# Patient Record
Sex: Male | Born: 1945 | Race: White | Hispanic: No | Marital: Married | Smoking: Former smoker
Health system: Southern US, Community
[De-identification: ages and names within clinical notes are randomized; demographics above are authoritative.]

## PROBLEM LIST (undated history)

## (undated) DIAGNOSIS — I480 Paroxysmal atrial fibrillation: Secondary | ICD-10-CM

## (undated) DIAGNOSIS — R7303 Prediabetes: Secondary | ICD-10-CM

## (undated) DIAGNOSIS — K759 Inflammatory liver disease, unspecified: Secondary | ICD-10-CM

## (undated) DIAGNOSIS — I10 Essential (primary) hypertension: Secondary | ICD-10-CM

## (undated) DIAGNOSIS — Z923 Personal history of irradiation: Secondary | ICD-10-CM

## (undated) DIAGNOSIS — N200 Calculus of kidney: Secondary | ICD-10-CM

## (undated) DIAGNOSIS — E119 Type 2 diabetes mellitus without complications: Secondary | ICD-10-CM

## (undated) DIAGNOSIS — Z87442 Personal history of urinary calculi: Secondary | ICD-10-CM

## (undated) DIAGNOSIS — M1990 Unspecified osteoarthritis, unspecified site: Secondary | ICD-10-CM

## (undated) DIAGNOSIS — C801 Malignant (primary) neoplasm, unspecified: Secondary | ICD-10-CM

## (undated) DIAGNOSIS — E7800 Pure hypercholesterolemia, unspecified: Secondary | ICD-10-CM

## (undated) DIAGNOSIS — Z9289 Personal history of other medical treatment: Secondary | ICD-10-CM

## (undated) DIAGNOSIS — M21371 Foot drop, right foot: Secondary | ICD-10-CM

## (undated) DIAGNOSIS — M21372 Foot drop, left foot: Secondary | ICD-10-CM

## (undated) DIAGNOSIS — G709 Myoneural disorder, unspecified: Secondary | ICD-10-CM

## (undated) HISTORY — DX: Calculus of kidney: N20.0

## (undated) HISTORY — PX: FOOT SURGERY: SHX648

## (undated) HISTORY — DX: Essential (primary) hypertension: I10

## (undated) HISTORY — PX: BACK SURGERY: SHX140

## (undated) HISTORY — DX: Personal history of irradiation: Z92.3

## (undated) HISTORY — DX: Pure hypercholesterolemia, unspecified: E78.00

## (undated) HISTORY — DX: Inflammatory liver disease, unspecified: K75.9

---

## 2000-11-15 ENCOUNTER — Encounter: Payer: Self-pay | Admitting: Family Medicine

## 2000-11-15 ENCOUNTER — Ambulatory Visit (HOSPITAL_COMMUNITY): Admission: RE | Admit: 2000-11-15 | Discharge: 2000-11-15 | Payer: Self-pay | Admitting: Family Medicine

## 2000-11-26 ENCOUNTER — Encounter: Payer: Self-pay | Admitting: Family Medicine

## 2000-11-26 ENCOUNTER — Ambulatory Visit (HOSPITAL_COMMUNITY): Admission: RE | Admit: 2000-11-26 | Discharge: 2000-11-26 | Payer: Self-pay | Admitting: Family Medicine

## 2002-05-26 ENCOUNTER — Ambulatory Visit (HOSPITAL_COMMUNITY): Admission: RE | Admit: 2002-05-26 | Discharge: 2002-05-26 | Payer: Self-pay | Admitting: Family Medicine

## 2002-05-26 ENCOUNTER — Encounter: Payer: Self-pay | Admitting: Family Medicine

## 2005-06-25 ENCOUNTER — Ambulatory Visit (HOSPITAL_COMMUNITY): Admission: RE | Admit: 2005-06-25 | Discharge: 2005-06-25 | Payer: Self-pay | Admitting: Family Medicine

## 2005-12-19 ENCOUNTER — Ambulatory Visit (HOSPITAL_COMMUNITY): Admission: RE | Admit: 2005-12-19 | Discharge: 2005-12-19 | Payer: Self-pay | Admitting: Otolaryngology

## 2008-01-13 ENCOUNTER — Emergency Department (HOSPITAL_COMMUNITY): Admission: EM | Admit: 2008-01-13 | Discharge: 2008-01-13 | Payer: Self-pay | Admitting: Emergency Medicine

## 2008-01-20 ENCOUNTER — Ambulatory Visit (HOSPITAL_COMMUNITY): Admission: RE | Admit: 2008-01-20 | Discharge: 2008-01-20 | Payer: Self-pay | Admitting: Urology

## 2008-09-04 ENCOUNTER — Emergency Department (HOSPITAL_COMMUNITY): Admission: EM | Admit: 2008-09-04 | Discharge: 2008-09-04 | Payer: Self-pay | Admitting: Emergency Medicine

## 2008-09-08 ENCOUNTER — Ambulatory Visit (HOSPITAL_COMMUNITY): Admission: RE | Admit: 2008-09-08 | Discharge: 2008-09-08 | Payer: Self-pay | Admitting: General Surgery

## 2010-02-19 ENCOUNTER — Encounter: Payer: Self-pay | Admitting: Orthopedic Surgery

## 2010-05-06 LAB — CBC
Hemoglobin: 14.4 g/dL (ref 13.0–17.0)
MCHC: 34.4 g/dL (ref 30.0–36.0)
Platelets: 216 10*3/uL (ref 150–400)
RDW: 12.8 % (ref 11.5–15.5)

## 2010-05-06 LAB — BASIC METABOLIC PANEL
BUN: 20 mg/dL (ref 6–23)
Creatinine, Ser: 0.96 mg/dL (ref 0.4–1.5)
GFR calc non Af Amer: >60 mL/min (ref >60)
Glucose, Bld: 140 mg/dL — ABNORMAL HIGH (ref 70–99)
Potassium: 3.9 mEq/L (ref 3.5–5.1)

## 2010-05-06 LAB — URINALYSIS, ROUTINE W REFLEX MICROSCOPIC
Bilirubin Urine: NEGATIVE
Nitrite: NEGATIVE
Specific Gravity, Urine: 1.015 (ref 1.005–1.030)
Urobilinogen, UA: 0.2 mg/dL (ref 0.0–1.0)

## 2010-05-06 LAB — DIFFERENTIAL
Basophils Absolute: 0 10*3/uL (ref 0.0–0.1)
Basophils Relative: 0 % (ref 0–1)
Eosinophils Relative: 2 % (ref 0–5)
Monocytes Absolute: 0.6 10*3/uL (ref 0.1–1.0)
Neutro Abs: 10 10*3/uL — ABNORMAL HIGH (ref 1.7–7.7)

## 2010-06-13 NOTE — H&P (Signed)
NAME:  James Dyer, TATSCH NO.:  [id number]   MEDICAL RECORD NO.:  [id number]          PATIENT TYPE:  [facility]   LOCATION:  DAY                           FACILITY:  [facility]   PHYSICIAN:  Dalia Heading, M.D.  DATE OF BIRTH:  13-Nov-1945   DATE OF ADMISSION:  DATE OF DISCHARGE:  LH                              HISTORY & PHYSICAL   CHIEF COMPLAINT:  Need for screening colonoscopy.   HISTORY OF PRESENT ILLNESS:  The patient is a 65 year old white male who  was referred for endoscopic evaluation.  He needs colonoscopy for  screening purposes.  No abdominal pain, weight loss, nausea, vomiting,  diarrhea, constipation, melena, hematochezia have been noted.  He has  never had a colonoscopy.  There is no family history of colon carcinoma.   PAST MEDICAL HISTORY:  Hypertension.   PAST SURGICAL HISTORY:  Unremarkable.   CURRENT MEDICATIONS:  Enalapril.   ALLERGIES:  No known drug allergies.   REVIEW OF SYSTEMS:  Noncontributory.   PHYSICAL EXAMINATION:  GENERAL:  The patient is a well-developed, well-  nourished white male in no acute distress.  LUNGS:  Clear to auscultation with equal breath sounds bilaterally.  HEART:  Regular rate and rhythm without S3, S4, or murmurs.  ABDOMEN:  Soft, nontender, nondistended.  No hepatosplenomegaly or  masses noted.  RECTAL:  Examination was deferred to the procedure.   IMPRESSION:  Need for screening colonoscopy.   PLAN:  The patient is scheduled for a colonoscopy on September 08, 2008.  The risks and benefits of the procedure including bleeding and  perforation were fully explained to the patient, gave informed consent.      Dalia Heading, M.D.  Electronically Signed     MAJ/MEDQ  D:  08/31/2008  T:  09/01/2008  Job:  [id number]   cc:   Corrie Mckusick, M.D.  Fax: [phone]   Short-Stay at [facility]

## 2010-06-18 ENCOUNTER — Inpatient Hospital Stay (INDEPENDENT_AMBULATORY_CARE_PROVIDER_SITE_OTHER)
Admission: RE | Admit: 2010-06-18 | Discharge: 2010-06-18 | Disposition: A | Discharge status: Home / Self Care | Payer: 59 | Source: Ambulatory Visit | Attending: Emergency Medicine | Admitting: Emergency Medicine

## 2010-06-18 DIAGNOSIS — J019 Acute sinusitis, unspecified: Secondary | ICD-10-CM

## 2010-06-18 DIAGNOSIS — J069 Acute upper respiratory infection, unspecified: Secondary | ICD-10-CM

## 2010-06-18 DIAGNOSIS — I10 Essential (primary) hypertension: Secondary | ICD-10-CM

## 2010-12-18 ENCOUNTER — Encounter: Payer: Self-pay | Admitting: *Deleted

## 2010-12-18 DIAGNOSIS — C61 Malignant neoplasm of prostate: Secondary | ICD-10-CM | POA: Insufficient documentation

## 2010-12-19 ENCOUNTER — Ambulatory Visit
Admission: RE | Admit: 2010-12-19 | Discharge: 2010-12-19 | Disposition: A | Discharge status: Home / Self Care | Payer: 59 | Source: Ambulatory Visit | Attending: Radiation Oncology | Admitting: Radiation Oncology

## 2010-12-19 ENCOUNTER — Encounter: Payer: Self-pay | Admitting: Radiation Oncology

## 2010-12-19 VITALS — BP 147/81 | HR 91 | Temp 97.9°F | Resp 18 | Ht 70.0 in | Wt 239.2 lb

## 2010-12-19 DIAGNOSIS — E7800 Pure hypercholesterolemia, unspecified: Secondary | ICD-10-CM | POA: Insufficient documentation

## 2010-12-19 DIAGNOSIS — C61 Malignant neoplasm of prostate: Secondary | ICD-10-CM | POA: Insufficient documentation

## 2010-12-19 DIAGNOSIS — Z87891 Personal history of nicotine dependence: Secondary | ICD-10-CM | POA: Insufficient documentation

## 2010-12-19 DIAGNOSIS — I10 Essential (primary) hypertension: Secondary | ICD-10-CM | POA: Insufficient documentation

## 2010-12-19 NOTE — Progress Notes (Signed)
Please see the Nurse Progress Note in the MD Initial Consult Encounter for this patient. 

## 2010-12-19 NOTE — Progress Notes (Signed)
CC:   James Dyer, M.D. James Dyer, M.D.  REFERRING PHYSICIAN:  Bertram Millard. Dyer, M.D.  DIAGNOSIS:  Stage T1c adenocarcinoma of the prostate.  PREVIOUS RADIATION THERAPY:  None.  PAST MEDICAL HISTORY:  History of hepatitis, question B, hypercholesterolemia, hypertension, and nephrolithiasis.  Also, status post foot surgery.  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  MEDICATIONS:  Vitamins, Vasotec, fish oil, and vitamin D.  ALLERGIES:  No known drug allergies.  FAMILY ETIOLOGY/SOCIAL:  Married, 21 year old son.  Remote cigarette use, 5 pack-year history.  FAMILY HISTORY:  His mother is 70, alive and well.  His father died from what sounds like stomach cancer at 52.  REVIEW OF SYSTEMS:  Is obtained and is listed in the HPI.  HISTORY OF PRESENT ILLNESS:  James Dyer is a pleasant 65 year old male who is seen today through the courtesy of Dr. Willow Ora for discussion of possible radiation therapy options in the management of his stage T1c adenocarcinoma of the prostate.  He was noted to have a PSA of 2.96 in August of 2011, rising to 3.7 by August of 2012.  He was seen by Dr. Retta Diones and underwent ultrasound-guided biopsies on 11/23/2010.  He was found have adenocarcinoma with a Gleason score 7 (3 + 4) involving 30% of 1 core from the right lateral apex.  Remaining biopsies were benign.  His prostate gland volume was 38 cc.  He is doing well from a GU and GI standpoint.  His IPSS score is 7.  No GI difficulties.  He is potent.  PHYSICAL EXAMINATION:  General: Alert and oriented 65 year old white male appearing his stated age.  Vital signs:  Blood pressure 147/81, pulse 91, respiratory rate 20, and temperature 97.9.  Head and neck examination:  Grossly unremarkable.  Nodes: Without palpable cervical or supraclavicular lymphadenopathy.  Chest: Lungs clear.  Back: Without spinal or CVA tenderness.  Heart: Regular rate and  rhythm.  Abdomen: Soft without masses or organomegaly.  Genitalia: Unremarkable to inspection.  Rectal: Prostate gland is normal in size and is without focal induration or nodularity.  Extremities: He has lower extremity foot/ankle braces.  There is no peripheral edema.  LABORATORY DATA:  PSA 3.7 from 09/02/2010.  IMPRESSION:  Stage T1c, intermediate risk adenocarcinoma of the prostate.  I explained to James Dyer and his wife that his prognosis is related to his stage, PSA level, and Gleason score.  His stage and PSA level are favorable while his Gleason score of 7 is of intermediate variability.  Management options include surgery versus observation versus radiation therapy.  Radiation therapy options include seed implantation alone versus external beam/IMRT.  We discussed the potential acute and late toxicities of each radiation therapy option. He also understands that surgery is certainly an excellent option for him.  A family friend, apparently had some difficulty with surgery along with urinary incontinence, and he wants to consider his radiation therapy options more carefully.  He was given literature for review.  He can call me if he wants to proceed with either radiation therapy option.  He would be an excellent candidate for seed implantation. One hour was spent face to face with the patient and his wife; more than 50% of the time counseling the patient.    ______________________________ Maryln Gottron, M.D. RJM/MEDQ  D:  12/19/2010  T:  12/19/2010  Job:  [id number]

## 2010-12-19 NOTE — Progress Notes (Signed)
Encounter addended by: Maryln Gottron, MD on: 12/19/2010  7:04 PM<BR>     Documentation filed: Visit Diagnoses

## 2010-12-19 NOTE — Progress Notes (Signed)
Encounter addended by: Maryln Gottron, MD on: 12/19/2010  6:26 PM<BR>     Documentation filed: Visit Diagnoses

## 2010-12-19 NOTE — Progress Notes (Signed)
Aug. 2012 PSA = 3.7 11/23/10 BIOPSY.....PROSTATE SIZE 38CC,       1/12 BIOPSIES REVEALED ADENOCARCINOMA.  THIS WAS FROM THE RIGHT LATERAL APEX, WITH 30% OF CORE BEING POSITIVE FOR CANCER.  HE HAS MINIMAL URINARY SYMPTOMATOLOGY, AUA BPH SYMPTOM SCORE IS 7.  HE HAS MILD FREQUENCY. NO ED

## 2010-12-19 NOTE — Progress Notes (Signed)
Encounter addended by: Maryln Gottron, MD on: 12/19/2010  6:25 PM<BR>     Documentation filed: Visit Diagnoses

## 2010-12-20 ENCOUNTER — Telehealth: Payer: Self-pay | Admitting: Radiation Oncology

## 2010-12-20 ENCOUNTER — Other Ambulatory Visit: Payer: Self-pay | Admitting: Radiation Oncology

## 2010-12-20 NOTE — Telephone Encounter (Signed)
Pt's wife Dois Davenport) called to request that Dr. Dayton Scrape fax a letter to his employer [employer])  Prescription Note faxed to: [phone]

## 2010-12-29 ENCOUNTER — Encounter: Payer: Self-pay | Admitting: *Deleted

## 2010-12-29 NOTE — Progress Notes (Signed)
[name] was referred by distress screening protocol. Pt scored a 8 on the Distress thermometer indicating severe distress. [name] attempted to contact pt, however, pt works during the day. [name] spoke with pt's wife who states pt retiring soon, but felt majority of stress at this time was due to insurance coverage. [name] requested pt contact to further assess for distress and provide support. Kathrin Penner, LCSWA

## 2011-01-18 ENCOUNTER — Encounter: Payer: Self-pay | Admitting: Radiation Oncology

## 2011-01-19 NOTE — Progress Notes (Signed)
CC:   Bertram Millard. Dahlstedt, M.D. Corrie Mckusick, M.D.  [name] called me today to tell me that he wants to be treated with external beam/IMRT rather than seed implantation.  I will kindly ask Dr. Retta Diones to place 3 gold seed markers for prostate localization/image guidance.  He plans on being out of town for up to a week in early February.  He does not want to begin his radiation therapy until after February 18.  I will get him set up for his treatment planning after February 18.  Consent will be signed when he returns for simulation/treatment planning.    ______________________________ Maryln Gottron, M.D. RJM/MEDQ  D:  01/18/2011  T:  01/19/2011  Job:  [id number]

## 2011-01-22 ENCOUNTER — Telehealth: Payer: Self-pay | Admitting: *Deleted

## 2011-01-30 DIAGNOSIS — C801 Malignant (primary) neoplasm, unspecified: Secondary | ICD-10-CM

## 2011-01-30 HISTORY — PX: OTHER SURGICAL HISTORY: SHX169

## 2011-01-30 HISTORY — DX: Malignant (primary) neoplasm, unspecified: C80.1

## 2011-02-01 ENCOUNTER — Telehealth: Payer: Self-pay | Admitting: *Deleted

## 2011-02-03 DIAGNOSIS — J069 Acute upper respiratory infection, unspecified: Secondary | ICD-10-CM | POA: Diagnosis not present

## 2011-02-03 DIAGNOSIS — H66009 Acute suppurative otitis media without spontaneous rupture of ear drum, unspecified ear: Secondary | ICD-10-CM | POA: Diagnosis not present

## 2011-02-03 DIAGNOSIS — J019 Acute sinusitis, unspecified: Secondary | ICD-10-CM | POA: Diagnosis not present

## 2011-02-03 DIAGNOSIS — Z6834 Body mass index (BMI) 34.0-34.9, adult: Secondary | ICD-10-CM | POA: Diagnosis not present

## 2011-03-19 DIAGNOSIS — C61 Malignant neoplasm of prostate: Secondary | ICD-10-CM | POA: Diagnosis not present

## 2011-03-21 ENCOUNTER — Ambulatory Visit: Payer: Medicare Other | Admitting: Radiation Oncology

## 2011-03-22 ENCOUNTER — Ambulatory Visit: Payer: Medicare Other

## 2011-03-22 ENCOUNTER — Ambulatory Visit: Payer: Medicare Other | Admitting: Radiation Oncology

## 2011-03-23 NOTE — Telephone Encounter (Signed)
xxx

## 2011-03-23 NOTE — Telephone Encounter (Signed)
XXXX 

## 2011-03-27 ENCOUNTER — Ambulatory Visit: Payer: Medicare Other | Admitting: Radiation Oncology

## 2011-03-27 ENCOUNTER — Ambulatory Visit: Payer: Medicare Other

## 2011-04-03 ENCOUNTER — Ambulatory Visit
Admission: RE | Admit: 2011-04-03 | Discharge: 2011-04-03 | Disposition: A | Discharge status: Home / Self Care | Payer: Medicare Other | Source: Ambulatory Visit | Attending: Radiation Oncology | Admitting: Radiation Oncology

## 2011-04-03 DIAGNOSIS — C61 Malignant neoplasm of prostate: Secondary | ICD-10-CM | POA: Diagnosis not present

## 2011-04-03 DIAGNOSIS — R350 Frequency of micturition: Secondary | ICD-10-CM | POA: Insufficient documentation

## 2011-04-03 NOTE — Progress Notes (Signed)
Simulation/treatment planning note:  The patient was taken to the CT simulator. He was placed supine. An alpha cradle was constructed for immobilization. A red rubber catheter was placed within the rectal vault. He was then catheterized and contrast instilled into the bladder/urethra. He was then scanned. I chose and isocenter along the mid prostate. On the CT data set I contoured his prostate and seminal vesicles. We also contoured his bladder, rectum, bowel and femoral heads. I prescribing 7800 cGy in 40 sessions to his prostate PTV which represents a prostate posterior 0.8 cm except for 0.5 cm along the rectum. I'm prescribing 5600 cGy in 40 sessions to his seminal vesical PTV which represents a seminal vesicles posterior 0.5 cm. He is now ready for IM RT simulation/treatment planning.

## 2011-04-03 NOTE — Progress Notes (Signed)
Met with patient to discuss RO billing.  Patient had no concerns today. 

## 2011-04-04 ENCOUNTER — Encounter: Payer: Self-pay | Admitting: *Deleted

## 2011-04-04 NOTE — Progress Notes (Signed)
12/19/2010 I-PSS= [id number] score=7

## 2011-04-05 ENCOUNTER — Encounter: Payer: Self-pay | Admitting: Radiation Oncology

## 2011-04-05 DIAGNOSIS — C61 Malignant neoplasm of prostate: Secondary | ICD-10-CM | POA: Diagnosis not present

## 2011-04-05 DIAGNOSIS — R350 Frequency of micturition: Secondary | ICD-10-CM | POA: Diagnosis not present

## 2011-04-05 NOTE — Progress Notes (Signed)
IMRT simulation/treatment planning note  The patient underwent IMRT simulation/treatment planning in the management of his carcinoma the prostate. IMRT was chosen to decrease the risk for both acute and late rectal and bladder toxicity compared to conventional or 3-D conformal radiation therapy. I'm prescribing 7800 cGy in 40 sessions to his prostate PTV and 5600 cGy in 40 sessions to his seminal vesicles. We used 10 MV photons for his IMRT to limit the dose extension along his anterior target when compared to 6 MV photons. Dose volume histograms were obtained for the target structures including the prostate and seminal vesicles. Dose volume histograms were also obtained for the avoidance structures including the rectum, bladder, and femoral heads. We met our department  target goals and avoidance goals. He is to be treated with a comfortably full bladder and undergo daily KV imaging, setting up to his 3 gold seeds within the prostate. I'm also requesting a weekly cone beam CT scan for assessment of his bladder filling. Please see his electronic medical record for a specific dose volume histograms.

## 2011-04-12 ENCOUNTER — Ambulatory Visit
Admission: RE | Admit: 2011-04-12 | Payer: Medicare Other | Source: Ambulatory Visit | Attending: Radiation Oncology | Admitting: Radiation Oncology

## 2011-04-12 ENCOUNTER — Ambulatory Visit
Admission: RE | Admit: 2011-04-12 | Discharge: 2011-04-12 | Disposition: A | Discharge status: Home / Self Care | Payer: Medicare Other | Source: Ambulatory Visit | Attending: Radiation Oncology | Admitting: Radiation Oncology

## 2011-04-12 DIAGNOSIS — C61 Malignant neoplasm of prostate: Secondary | ICD-10-CM | POA: Diagnosis not present

## 2011-04-12 DIAGNOSIS — R350 Frequency of micturition: Secondary | ICD-10-CM | POA: Diagnosis not present

## 2011-04-13 ENCOUNTER — Ambulatory Visit
Admission: RE | Admit: 2011-04-13 | Discharge: 2011-04-13 | Disposition: A | Discharge status: Home / Self Care | Payer: Medicare Other | Source: Ambulatory Visit | Admitting: Radiation Oncology

## 2011-04-13 ENCOUNTER — Ambulatory Visit
Admission: RE | Admit: 2011-04-13 | Discharge: 2011-04-13 | Disposition: A | Discharge status: Home / Self Care | Payer: Medicare Other | Source: Ambulatory Visit | Attending: Radiation Oncology | Admitting: Radiation Oncology

## 2011-04-13 DIAGNOSIS — C61 Malignant neoplasm of prostate: Secondary | ICD-10-CM | POA: Diagnosis not present

## 2011-04-13 DIAGNOSIS — R350 Frequency of micturition: Secondary | ICD-10-CM | POA: Diagnosis not present

## 2011-04-16 ENCOUNTER — Ambulatory Visit
Admission: RE | Admit: 2011-04-16 | Discharge: 2011-04-16 | Disposition: A | Discharge status: Home / Self Care | Payer: Medicare Other | Source: Ambulatory Visit | Attending: Radiation Oncology | Admitting: Radiation Oncology

## 2011-04-16 ENCOUNTER — Encounter: Payer: Self-pay | Admitting: Radiation Oncology

## 2011-04-16 VITALS — BP 136/88 | HR 79 | Resp 18 | Wt 237.4 lb

## 2011-04-16 DIAGNOSIS — R350 Frequency of micturition: Secondary | ICD-10-CM | POA: Diagnosis not present

## 2011-04-16 DIAGNOSIS — C61 Malignant neoplasm of prostate: Secondary | ICD-10-CM | POA: Diagnosis not present

## 2011-04-16 NOTE — Progress Notes (Signed)
Weekly Management Note:  Site:Prostate Current Dose:  585  cGy Projected Dose: 7800  cGy  Narrative: The patient is seen today for routine under treatment assessment. CBCT/MVCT images/port films were reviewed. The chart was reviewed.   Satisfactory bladder filling although he did not have his water bottle on his way to treatment today. He knows he can have better bladder filling. No GU or GI difficulties.  Physical Examination:  Filed Vitals:   04/16/11 1423  BP: 136/88  Pulse: 79  Resp:   .  Weight: 237 lb 6.4 oz (107.684 kg). No change.  Impression: Tolerating radiation therapy well.  Plan: Continue radiation therapy as planned.

## 2011-04-16 NOTE — Progress Notes (Signed)
Patient presents to the clinic today unaccompanied for under treat visit with Dr. Dayton Scrape and post sim education with Sam, RN. Patient is alert and oriented to person, place, and time. No distress noted. Steady gait noted. Pleasant affect noted. Patient denies pain at this time. Blood pressure elevated despite patient taking bp med this morning. Patient reports voiding once per night. Patient reports discomfort while urination but denies burning. Patient reports that over the weekend he noted that he did not get the urge to void as he has in the past but when he would force himself to void he could pass a considerable amount of urine. Patient denies hematuria. Patient denies nausea, vomiting, headache, dizziness or diarrhea. Reported all findings to Dr. Dayton Scrape.

## 2011-04-16 NOTE — Progress Notes (Signed)
Received patient in the clinic today for PUT with Dr. Dayton Scrape and post sim education with Sam, RN. Patient is alert and oriented to person, place, and time. No distress noted. Steady gait noted. Pleasant affect noted. Patient denies pain at this time. Oriented patient to staff and routine of the clinic. Provided patient with RADIATION THERAPY AND YOU handbook then, reviewed pertinent information. Educated patient on potential side effects and management. Provided patient with this writers business card and encouraged to call with needs. Patient verbalized understanding of all things reviewed.

## 2011-04-17 ENCOUNTER — Ambulatory Visit
Admission: RE | Admit: 2011-04-17 | Discharge: 2011-04-17 | Disposition: A | Discharge status: Home / Self Care | Payer: Medicare Other | Source: Ambulatory Visit | Attending: Radiation Oncology | Admitting: Radiation Oncology

## 2011-04-17 DIAGNOSIS — C61 Malignant neoplasm of prostate: Secondary | ICD-10-CM | POA: Diagnosis not present

## 2011-04-17 DIAGNOSIS — R350 Frequency of micturition: Secondary | ICD-10-CM | POA: Diagnosis not present

## 2011-04-18 ENCOUNTER — Ambulatory Visit
Admission: RE | Admit: 2011-04-18 | Discharge: 2011-04-18 | Disposition: A | Discharge status: Home / Self Care | Payer: Medicare Other | Source: Ambulatory Visit | Attending: Radiation Oncology | Admitting: Radiation Oncology

## 2011-04-18 DIAGNOSIS — C61 Malignant neoplasm of prostate: Secondary | ICD-10-CM | POA: Diagnosis not present

## 2011-04-18 DIAGNOSIS — R350 Frequency of micturition: Secondary | ICD-10-CM | POA: Diagnosis not present

## 2011-04-19 ENCOUNTER — Ambulatory Visit
Admission: RE | Admit: 2011-04-19 | Discharge: 2011-04-19 | Disposition: A | Discharge status: Home / Self Care | Payer: Medicare Other | Source: Ambulatory Visit | Attending: Radiation Oncology | Admitting: Radiation Oncology

## 2011-04-19 DIAGNOSIS — R350 Frequency of micturition: Secondary | ICD-10-CM | POA: Diagnosis not present

## 2011-04-19 DIAGNOSIS — C61 Malignant neoplasm of prostate: Secondary | ICD-10-CM | POA: Diagnosis not present

## 2011-04-20 ENCOUNTER — Ambulatory Visit
Admission: RE | Admit: 2011-04-20 | Discharge: 2011-04-20 | Disposition: A | Discharge status: Home / Self Care | Payer: Medicare Other | Source: Ambulatory Visit | Attending: Radiation Oncology | Admitting: Radiation Oncology

## 2011-04-20 DIAGNOSIS — R350 Frequency of micturition: Secondary | ICD-10-CM | POA: Diagnosis not present

## 2011-04-20 DIAGNOSIS — C61 Malignant neoplasm of prostate: Secondary | ICD-10-CM | POA: Diagnosis not present

## 2011-04-23 ENCOUNTER — Encounter: Payer: Self-pay | Admitting: Radiation Oncology

## 2011-04-23 ENCOUNTER — Ambulatory Visit
Admission: RE | Admit: 2011-04-23 | Discharge: 2011-04-23 | Disposition: A | Discharge status: Home / Self Care | Payer: Medicare Other | Source: Ambulatory Visit | Attending: Radiation Oncology | Admitting: Radiation Oncology

## 2011-04-23 VITALS — BP 170/98 | HR 104 | Resp 18 | Wt 236.8 lb

## 2011-04-23 DIAGNOSIS — C61 Malignant neoplasm of prostate: Secondary | ICD-10-CM | POA: Diagnosis not present

## 2011-04-23 DIAGNOSIS — R350 Frequency of micturition: Secondary | ICD-10-CM | POA: Diagnosis not present

## 2011-04-23 NOTE — Progress Notes (Signed)
Weekly Management Note:  Site:Prostate Current Dose:  1560  cGy Projected Dose: 7800  cGy  Narrative: The patient is seen today for routine under treatment assessment. CBCT/MVCT images/port films were reviewed. The chart was reviewed. Bladder filling is excellent.  No significant GU or GI difficulties.  Physical Examination:  Filed Vitals:   04/23/11 1530  BP: 170/98  Pulse: 104  Resp: 18  .  Weight: 236 lb 12.8 oz (107.412 kg). No change.  Impression: Tolerating radiation therapy well.  Plan: Continue radiation therapy as planned.

## 2011-04-23 NOTE — Progress Notes (Signed)
Patient presents to the clinic today unaccompanied for an under treat visit with Dr. Dayton Scrape. Patient is alert and oriented to person, place, and time. No distress noted. Pleasant affect noted. Patient denies pain at this time. Patient reports burning with urination is improved. Patient denies hematuria. Patients one episode of diarrhea yesterday. Patient reports a "yo yo" case of frequency and urgency. Patient reports that one day he is going left and right and has to get to the bathroom right away before he leaks but, the next day he is fine. Patient has questions about intercourse and prostate seeds. Reported all findings to Dr. Dayton Scrape.

## 2011-04-24 ENCOUNTER — Ambulatory Visit
Admission: RE | Admit: 2011-04-24 | Discharge: 2011-04-24 | Disposition: A | Discharge status: Home / Self Care | Payer: Medicare Other | Source: Ambulatory Visit | Attending: Radiation Oncology | Admitting: Radiation Oncology

## 2011-04-24 DIAGNOSIS — C61 Malignant neoplasm of prostate: Secondary | ICD-10-CM | POA: Diagnosis not present

## 2011-04-24 DIAGNOSIS — R350 Frequency of micturition: Secondary | ICD-10-CM | POA: Diagnosis not present

## 2011-04-25 ENCOUNTER — Ambulatory Visit
Admission: RE | Admit: 2011-04-25 | Discharge: 2011-04-25 | Disposition: A | Discharge status: Home / Self Care | Payer: Medicare Other | Source: Ambulatory Visit | Attending: Radiation Oncology | Admitting: Radiation Oncology

## 2011-04-25 DIAGNOSIS — C61 Malignant neoplasm of prostate: Secondary | ICD-10-CM | POA: Diagnosis not present

## 2011-04-25 DIAGNOSIS — R350 Frequency of micturition: Secondary | ICD-10-CM | POA: Diagnosis not present

## 2011-04-26 ENCOUNTER — Ambulatory Visit
Admission: RE | Admit: 2011-04-26 | Discharge: 2011-04-26 | Disposition: A | Discharge status: Home / Self Care | Payer: Medicare Other | Source: Ambulatory Visit | Attending: Radiation Oncology | Admitting: Radiation Oncology

## 2011-04-26 DIAGNOSIS — R350 Frequency of micturition: Secondary | ICD-10-CM | POA: Diagnosis not present

## 2011-04-26 DIAGNOSIS — C61 Malignant neoplasm of prostate: Secondary | ICD-10-CM | POA: Diagnosis not present

## 2011-04-27 ENCOUNTER — Ambulatory Visit
Admission: RE | Admit: 2011-04-27 | Discharge: 2011-04-27 | Disposition: A | Discharge status: Home / Self Care | Payer: Medicare Other | Source: Ambulatory Visit | Attending: Radiation Oncology | Admitting: Radiation Oncology

## 2011-04-27 DIAGNOSIS — R350 Frequency of micturition: Secondary | ICD-10-CM | POA: Diagnosis not present

## 2011-04-27 DIAGNOSIS — C61 Malignant neoplasm of prostate: Secondary | ICD-10-CM | POA: Diagnosis not present

## 2011-04-30 ENCOUNTER — Ambulatory Visit
Admission: RE | Admit: 2011-04-30 | Discharge: 2011-04-30 | Disposition: A | Discharge status: Home / Self Care | Payer: Medicare Other | Source: Ambulatory Visit | Attending: Radiation Oncology | Admitting: Radiation Oncology

## 2011-04-30 ENCOUNTER — Encounter: Payer: Self-pay | Admitting: Radiation Oncology

## 2011-04-30 VITALS — BP 142/93 | HR 86 | Resp 18 | Wt 237.7 lb

## 2011-04-30 DIAGNOSIS — R350 Frequency of micturition: Secondary | ICD-10-CM | POA: Diagnosis not present

## 2011-04-30 DIAGNOSIS — C61 Malignant neoplasm of prostate: Secondary | ICD-10-CM

## 2011-04-30 MED ORDER — TAMSULOSIN HCL 0.4 MG PO CAPS: 0.4000 mg | ORAL_CAPSULE | Freq: Every day | ORAL | Status: DC

## 2011-04-30 NOTE — Progress Notes (Signed)
Patient presents to the clinic today unaccompanied for under treat visit with Dr. Basilio Cairo. Patient is alert and oriented to person, place, and time. No distress noted. Steady gait noted. Pleasant affect noted. Patient denies pain at this time. Patient reports spell of diarrhea last week has resolved. Patient reports that Saturday he "over did it with his log splitter." Patient reports that since Saturday he has had trouble with urgency and incontinence. Patient reports he is unable to completely empty his bladder. Patient denies burning with urination or hematuria. Reported all findings to Dr. Basilio Cairo.

## 2011-04-30 NOTE — Progress Notes (Signed)
   Weekly Management Note, prostate cancer Current Dose:   2535cGy  Projected Dose:  7800cGy   Narrative:  The patient presents for routine under treatment assessment.  CBCT/MVCT images/Port film x-rays were reviewed.  The chart was checked. He is doing well. He has had more urinary urgency and some incontinence. He sometimes feels that he has to go the bathroom right after he is already gone.  Physical Findings: Weight: 237 lb 11.2 oz (107.82 kg).  In no acute distress, well-appearing  Impression:  The patient is tolerating radiotherapy with acute effects.  Plan:  Continue radiotherapy as planned. I gave him a prescription today for Flomax. He might not use this but has the prescription if he would like to fill it

## 2011-05-01 ENCOUNTER — Other Ambulatory Visit: Payer: Self-pay | Admitting: Radiation Oncology

## 2011-05-01 ENCOUNTER — Ambulatory Visit
Admission: RE | Admit: 2011-05-01 | Discharge: 2011-05-01 | Disposition: A | Discharge status: Home / Self Care | Payer: Medicare Other | Source: Ambulatory Visit | Attending: Radiation Oncology | Admitting: Radiation Oncology

## 2011-05-01 DIAGNOSIS — R350 Frequency of micturition: Secondary | ICD-10-CM | POA: Diagnosis not present

## 2011-05-01 DIAGNOSIS — C61 Malignant neoplasm of prostate: Secondary | ICD-10-CM | POA: Diagnosis not present

## 2011-05-02 ENCOUNTER — Ambulatory Visit
Admission: RE | Admit: 2011-05-02 | Discharge: 2011-05-02 | Disposition: A | Discharge status: Home / Self Care | Payer: Medicare Other | Source: Ambulatory Visit | Attending: Radiation Oncology | Admitting: Radiation Oncology

## 2011-05-02 DIAGNOSIS — C61 Malignant neoplasm of prostate: Secondary | ICD-10-CM | POA: Diagnosis not present

## 2011-05-02 DIAGNOSIS — R350 Frequency of micturition: Secondary | ICD-10-CM | POA: Diagnosis not present

## 2011-05-03 ENCOUNTER — Ambulatory Visit
Admission: RE | Admit: 2011-05-03 | Discharge: 2011-05-03 | Disposition: A | Discharge status: Home / Self Care | Payer: Medicare Other | Source: Ambulatory Visit | Attending: Radiation Oncology | Admitting: Radiation Oncology

## 2011-05-03 DIAGNOSIS — R350 Frequency of micturition: Secondary | ICD-10-CM | POA: Diagnosis not present

## 2011-05-03 DIAGNOSIS — C61 Malignant neoplasm of prostate: Secondary | ICD-10-CM | POA: Diagnosis not present

## 2011-05-04 ENCOUNTER — Ambulatory Visit
Admission: RE | Admit: 2011-05-04 | Discharge: 2011-05-04 | Disposition: A | Discharge status: Home / Self Care | Payer: Medicare Other | Source: Ambulatory Visit | Attending: Radiation Oncology | Admitting: Radiation Oncology

## 2011-05-04 DIAGNOSIS — R350 Frequency of micturition: Secondary | ICD-10-CM | POA: Diagnosis not present

## 2011-05-04 DIAGNOSIS — C61 Malignant neoplasm of prostate: Secondary | ICD-10-CM | POA: Diagnosis not present

## 2011-05-07 ENCOUNTER — Ambulatory Visit
Admission: RE | Admit: 2011-05-07 | Discharge: 2011-05-07 | Disposition: A | Discharge status: Home / Self Care | Payer: Medicare Other | Source: Ambulatory Visit | Attending: Radiation Oncology | Admitting: Radiation Oncology

## 2011-05-07 ENCOUNTER — Encounter: Payer: Self-pay | Admitting: Radiation Oncology

## 2011-05-07 VITALS — BP 139/100 | HR 76 | Resp 18 | Wt 239.1 lb

## 2011-05-07 DIAGNOSIS — C61 Malignant neoplasm of prostate: Secondary | ICD-10-CM

## 2011-05-07 DIAGNOSIS — R350 Frequency of micturition: Secondary | ICD-10-CM | POA: Diagnosis not present

## 2011-05-07 NOTE — Progress Notes (Signed)
Patient presents to the clinic today unaccompanied for under treat visit with Dr. Dayton Scrape. Patient is alert and oriented to person, place, and time. No distress noted. Steady gait noted. Pleasant affect noted. Patient denies pain at this time. Patient reports that on average he gets up 2-3 times per night to void. Patient denies hematuria. Patient reports urgency and weak urine stream. Patient reports that the "flomax she (Dr. Basilio Cairo) gave me last week doesn't seem to help." Reported all findings to Dr. Dayton Scrape.

## 2011-05-07 NOTE — Progress Notes (Signed)
Weekly Management Note:  Site:Prostate Current Dose:  3510  cGy Projected Dose: 7800  cGy  Narrative: The patient is seen today for routine under treatment assessment. CBCT/MVCT images/port films were reviewed. The chart was reviewed.   Bladder filling satisfactory. He does have nocturia x2-3. He started on Flomax last week without significant improvement.  Physical Examination:  Filed Vitals:   05/07/11 1622  BP: 139/100  Pulse: 76  Resp: 18  .  Weight: 239 lb 1.6 oz (108.455 kg). No change.  Impression: Tolerating radiation therapy well. We will see if his urination improves over the next few days. I'm not sure if he needs an anti-spasmodic.  Plan: Continue radiation therapy as planned.

## 2011-05-08 ENCOUNTER — Ambulatory Visit
Admission: RE | Admit: 2011-05-08 | Discharge: 2011-05-08 | Disposition: A | Discharge status: Home / Self Care | Payer: Medicare Other | Source: Ambulatory Visit | Attending: Radiation Oncology | Admitting: Radiation Oncology

## 2011-05-08 DIAGNOSIS — R350 Frequency of micturition: Secondary | ICD-10-CM | POA: Diagnosis not present

## 2011-05-08 DIAGNOSIS — C61 Malignant neoplasm of prostate: Secondary | ICD-10-CM | POA: Diagnosis not present

## 2011-05-09 ENCOUNTER — Ambulatory Visit
Admission: RE | Admit: 2011-05-09 | Discharge: 2011-05-09 | Disposition: A | Discharge status: Home / Self Care | Payer: Medicare Other | Source: Ambulatory Visit | Attending: Radiation Oncology | Admitting: Radiation Oncology

## 2011-05-09 DIAGNOSIS — C61 Malignant neoplasm of prostate: Secondary | ICD-10-CM | POA: Diagnosis not present

## 2011-05-09 DIAGNOSIS — R350 Frequency of micturition: Secondary | ICD-10-CM | POA: Diagnosis not present

## 2011-05-10 ENCOUNTER — Ambulatory Visit
Admission: RE | Admit: 2011-05-10 | Discharge: 2011-05-10 | Disposition: A | Discharge status: Home / Self Care | Payer: Medicare Other | Source: Ambulatory Visit | Attending: Radiation Oncology | Admitting: Radiation Oncology

## 2011-05-10 DIAGNOSIS — R350 Frequency of micturition: Secondary | ICD-10-CM | POA: Diagnosis not present

## 2011-05-10 DIAGNOSIS — C61 Malignant neoplasm of prostate: Secondary | ICD-10-CM | POA: Diagnosis not present

## 2011-05-11 ENCOUNTER — Ambulatory Visit
Admission: RE | Admit: 2011-05-11 | Discharge: 2011-05-11 | Disposition: A | Discharge status: Home / Self Care | Payer: Medicare Other | Source: Ambulatory Visit | Attending: Radiation Oncology | Admitting: Radiation Oncology

## 2011-05-11 DIAGNOSIS — C61 Malignant neoplasm of prostate: Secondary | ICD-10-CM | POA: Diagnosis not present

## 2011-05-11 DIAGNOSIS — R350 Frequency of micturition: Secondary | ICD-10-CM | POA: Diagnosis not present

## 2011-05-14 ENCOUNTER — Ambulatory Visit
Admission: RE | Admit: 2011-05-14 | Discharge: 2011-05-14 | Disposition: A | Discharge status: Home / Self Care | Payer: Medicare Other | Source: Ambulatory Visit | Attending: Radiation Oncology | Admitting: Radiation Oncology

## 2011-05-14 DIAGNOSIS — R350 Frequency of micturition: Secondary | ICD-10-CM | POA: Diagnosis not present

## 2011-05-14 DIAGNOSIS — C61 Malignant neoplasm of prostate: Secondary | ICD-10-CM

## 2011-05-14 NOTE — Progress Notes (Signed)
Weekly Management Note:  Site:Prostate Current Dose:  4485  cGy Projected Dose: 7800  cGy  Narrative: The patient is seen today for routine under treatment assessment. CBCT/MVCT images/port films were reviewed. The chart was reviewed.   Bladder filling today. Over the weekend, and earlier today he has been having some slowing of the stream and dribbling. His nocturia has worsened of 3-4. No GI difficulties. He takes Flomax once a day. He has been bothered by allergies and he has been taking Benadryl.  Physical Examination: There were no vitals filed for this visit..  Weight:  . No change.  Impression: Tolerating radiation therapy well with the exception of obstructive symptoms for which I told him to increase his Flomax to twice a day and also to stop his antihistamines.  Plan: Continue radiation therapy as planned.

## 2011-05-14 NOTE — Progress Notes (Signed)
HERE TODAY FOR PUT OF PROSTATE.  HAS HAD FREQUENCY AND IS NOT EMPTYING BLADDER, SAYS HE JUST DRIBBLES WHEN HE GETS TO BR.  ALSO HAS SOME STINGING/BURNING WHEN VOIDS.  GETTING UP 3 - 4 TIMES NIGHTLY.

## 2011-05-15 ENCOUNTER — Ambulatory Visit
Admission: RE | Admit: 2011-05-15 | Discharge: 2011-05-15 | Disposition: A | Discharge status: Home / Self Care | Payer: Medicare Other | Source: Ambulatory Visit | Attending: Radiation Oncology | Admitting: Radiation Oncology

## 2011-05-15 DIAGNOSIS — R350 Frequency of micturition: Secondary | ICD-10-CM | POA: Diagnosis not present

## 2011-05-15 DIAGNOSIS — C61 Malignant neoplasm of prostate: Secondary | ICD-10-CM | POA: Diagnosis not present

## 2011-05-16 ENCOUNTER — Ambulatory Visit
Admission: RE | Admit: 2011-05-16 | Discharge: 2011-05-16 | Disposition: A | Discharge status: Home / Self Care | Payer: Medicare Other | Source: Ambulatory Visit | Attending: Radiation Oncology | Admitting: Radiation Oncology

## 2011-05-16 DIAGNOSIS — C61 Malignant neoplasm of prostate: Secondary | ICD-10-CM | POA: Diagnosis not present

## 2011-05-16 DIAGNOSIS — R350 Frequency of micturition: Secondary | ICD-10-CM | POA: Diagnosis not present

## 2011-05-17 ENCOUNTER — Ambulatory Visit
Admission: RE | Admit: 2011-05-17 | Discharge: 2011-05-17 | Disposition: A | Discharge status: Home / Self Care | Payer: Medicare Other | Source: Ambulatory Visit | Attending: Radiation Oncology | Admitting: Radiation Oncology

## 2011-05-17 DIAGNOSIS — C61 Malignant neoplasm of prostate: Secondary | ICD-10-CM | POA: Diagnosis not present

## 2011-05-17 DIAGNOSIS — R350 Frequency of micturition: Secondary | ICD-10-CM | POA: Diagnosis not present

## 2011-05-18 ENCOUNTER — Ambulatory Visit
Admission: RE | Admit: 2011-05-18 | Discharge: 2011-05-18 | Disposition: A | Discharge status: Home / Self Care | Payer: Medicare Other | Source: Ambulatory Visit | Attending: Radiation Oncology | Admitting: Radiation Oncology

## 2011-05-18 DIAGNOSIS — R350 Frequency of micturition: Secondary | ICD-10-CM | POA: Diagnosis not present

## 2011-05-18 DIAGNOSIS — C61 Malignant neoplasm of prostate: Secondary | ICD-10-CM | POA: Diagnosis not present

## 2011-05-21 ENCOUNTER — Encounter: Payer: Self-pay | Admitting: Radiation Oncology

## 2011-05-21 ENCOUNTER — Ambulatory Visit
Admission: RE | Admit: 2011-05-21 | Discharge: 2011-05-21 | Disposition: A | Discharge status: Home / Self Care | Payer: Medicare Other | Source: Ambulatory Visit | Attending: Radiation Oncology | Admitting: Radiation Oncology

## 2011-05-21 ENCOUNTER — Other Ambulatory Visit: Payer: Self-pay | Admitting: Radiation Oncology

## 2011-05-21 VITALS — BP 165/106 | HR 88 | Resp 18 | Wt 240.3 lb

## 2011-05-21 DIAGNOSIS — C61 Malignant neoplasm of prostate: Secondary | ICD-10-CM | POA: Diagnosis not present

## 2011-05-21 DIAGNOSIS — R350 Frequency of micturition: Secondary | ICD-10-CM | POA: Diagnosis not present

## 2011-05-21 MED ORDER — TAMSULOSIN HCL 0.4 MG PO CAPS: 0.4000 mg | ORAL_CAPSULE | Freq: Two times a day (BID) | ORAL | Status: DC

## 2011-05-21 NOTE — Progress Notes (Signed)
Patient presents to the clinic today unaccompanied for under treat visit with Dr. Dayton Scrape. Patient is alert and oriented to person, place, and time. No distress noted. Steady gait noted. Pleasant affect noted. Patient denies pain at this time. Patient requesting Flomax refill. Patient reports getting up every 1-2 hours during the night to void. Patient reports that "about every other time he voids he has a loose bowel movement." patient reports intermittent mild burning with urination but denies hematuria. Patient reports intermittent strong to weak urine stream. Patient reports occasionally he has to strain to urinate. Patient reports urine output is very erratic.Reported all findings to Dr. Dayton Scrape.

## 2011-05-21 NOTE — Progress Notes (Signed)
Weekly Management Note:Prostate  Site: Prostate Current Dose:  5460 cGy Projected Dose: 7800  cGy  Narrative: The patient is seen today for routine under treatment assessment. CBCT/MVCT images/port films were reviewed. The chart was reviewed. Bladder filling is satisfactory. His urinary force of stream varies from day to day. No GI problems except for intermittent loosening of his bowels.Marland Kitchen He is currently taking Flomax by mouth twice a day.  Physical Examination:  Filed Vitals:   05/21/11 1526  BP: 165/106  Pulse: 88  Resp: 18  .  Weight: 240 lb 4.8 oz (108.999 kg). No change.  Impression: Tolerating radiation therapy well. Continue Flomax twice a day. Prescription refilled today.  Plan: Continue radiation therapy as planned.

## 2011-05-22 ENCOUNTER — Ambulatory Visit
Admission: RE | Admit: 2011-05-22 | Discharge: 2011-05-22 | Disposition: A | Discharge status: Home / Self Care | Payer: Medicare Other | Source: Ambulatory Visit | Attending: Radiation Oncology | Admitting: Radiation Oncology

## 2011-05-22 DIAGNOSIS — R350 Frequency of micturition: Secondary | ICD-10-CM | POA: Diagnosis not present

## 2011-05-22 DIAGNOSIS — C61 Malignant neoplasm of prostate: Secondary | ICD-10-CM | POA: Diagnosis not present

## 2011-05-23 ENCOUNTER — Ambulatory Visit
Admission: RE | Admit: 2011-05-23 | Discharge: 2011-05-23 | Disposition: A | Discharge status: Home / Self Care | Payer: Medicare Other | Source: Ambulatory Visit | Attending: Radiation Oncology | Admitting: Radiation Oncology

## 2011-05-23 DIAGNOSIS — R350 Frequency of micturition: Secondary | ICD-10-CM | POA: Diagnosis not present

## 2011-05-23 DIAGNOSIS — C61 Malignant neoplasm of prostate: Secondary | ICD-10-CM | POA: Diagnosis not present

## 2011-05-24 ENCOUNTER — Ambulatory Visit
Admission: RE | Admit: 2011-05-24 | Discharge: 2011-05-24 | Disposition: A | Discharge status: Home / Self Care | Payer: Medicare Other | Source: Ambulatory Visit | Attending: Radiation Oncology | Admitting: Radiation Oncology

## 2011-05-24 DIAGNOSIS — R350 Frequency of micturition: Secondary | ICD-10-CM | POA: Diagnosis not present

## 2011-05-24 DIAGNOSIS — C61 Malignant neoplasm of prostate: Secondary | ICD-10-CM | POA: Diagnosis not present

## 2011-05-25 ENCOUNTER — Ambulatory Visit
Admission: RE | Admit: 2011-05-25 | Discharge: 2011-05-25 | Disposition: A | Discharge status: Home / Self Care | Payer: Medicare Other | Source: Ambulatory Visit | Attending: Radiation Oncology | Admitting: Radiation Oncology

## 2011-05-25 DIAGNOSIS — C61 Malignant neoplasm of prostate: Secondary | ICD-10-CM | POA: Diagnosis not present

## 2011-05-25 DIAGNOSIS — R350 Frequency of micturition: Secondary | ICD-10-CM | POA: Diagnosis not present

## 2011-05-28 ENCOUNTER — Ambulatory Visit
Admission: RE | Admit: 2011-05-28 | Discharge: 2011-05-28 | Disposition: A | Discharge status: Home / Self Care | Payer: Medicare Other | Source: Ambulatory Visit | Attending: Radiation Oncology | Admitting: Radiation Oncology

## 2011-05-28 ENCOUNTER — Encounter: Payer: Self-pay | Admitting: Radiation Oncology

## 2011-05-28 VITALS — BP 153/95 | HR 111 | Resp 18 | Wt 242.8 lb

## 2011-05-28 DIAGNOSIS — R350 Frequency of micturition: Secondary | ICD-10-CM | POA: Diagnosis not present

## 2011-05-28 DIAGNOSIS — C61 Malignant neoplasm of prostate: Secondary | ICD-10-CM | POA: Diagnosis not present

## 2011-05-28 NOTE — Progress Notes (Signed)
Weekly Management Note:  Site:Prostate Current Dose:  6435  cGy Projected Dose: 7800  cGy  Narrative: The patient is seen today for routine under treatment assessment. CBCT/MVCT images/port films were reviewed. The chart was reviewed. Satisfactory bladder filling.  He continues to have your frequency with nocturia x3-4. He takes Flomax twice a day. He does have intermittent mild dysuria but this is tolerable. He occasionally takes Aleve when necessary. No GI difficulties.  Physical Examination:  Filed Vitals:   05/28/11 1746  BP: 153/95  Pulse: 111  Resp: 18  .  Weight: 242 lb 12.8 oz (110.133 kg). No change.  Impression: Tolerating radiation therapy well.  Plan: Continue radiation therapy as planned.

## 2011-05-28 NOTE — Progress Notes (Signed)
Patient presents to the clinic today unaccompanied for under treat visit with Dr. Dayton Scrape. Patient is alert and oriented to person, place, and time. No distress noted. Steady gait noted. Pleasant affect noted. Patient denies pain at this time. Patient reports on average he voids every two hours during the night. Patient reports using Flomax bid. Patient reports his urine stream alternated between weak and strong. Patient denies hematuria. Patient reports burning with urination that Aleve resolves. Reported all findings to Dr. Dayton Scrape.

## 2011-05-29 ENCOUNTER — Ambulatory Visit
Admission: RE | Admit: 2011-05-29 | Discharge: 2011-05-29 | Disposition: A | Discharge status: Home / Self Care | Payer: Medicare Other | Source: Ambulatory Visit | Attending: Radiation Oncology | Admitting: Radiation Oncology

## 2011-05-29 DIAGNOSIS — C61 Malignant neoplasm of prostate: Secondary | ICD-10-CM | POA: Diagnosis not present

## 2011-05-29 DIAGNOSIS — R350 Frequency of micturition: Secondary | ICD-10-CM | POA: Diagnosis not present

## 2011-05-30 ENCOUNTER — Ambulatory Visit
Admission: RE | Admit: 2011-05-30 | Discharge: 2011-05-30 | Disposition: A | Discharge status: Home / Self Care | Payer: Medicare Other | Source: Ambulatory Visit | Attending: Radiation Oncology | Admitting: Radiation Oncology

## 2011-05-30 DIAGNOSIS — C61 Malignant neoplasm of prostate: Secondary | ICD-10-CM | POA: Diagnosis not present

## 2011-05-30 DIAGNOSIS — R350 Frequency of micturition: Secondary | ICD-10-CM | POA: Diagnosis not present

## 2011-05-31 ENCOUNTER — Ambulatory Visit
Admission: RE | Admit: 2011-05-31 | Discharge: 2011-05-31 | Disposition: A | Discharge status: Home / Self Care | Payer: Medicare Other | Source: Ambulatory Visit | Attending: Radiation Oncology | Admitting: Radiation Oncology

## 2011-05-31 DIAGNOSIS — C61 Malignant neoplasm of prostate: Secondary | ICD-10-CM | POA: Diagnosis not present

## 2011-05-31 DIAGNOSIS — R350 Frequency of micturition: Secondary | ICD-10-CM | POA: Diagnosis not present

## 2011-06-01 ENCOUNTER — Ambulatory Visit
Admission: RE | Admit: 2011-06-01 | Discharge: 2011-06-01 | Disposition: A | Discharge status: Home / Self Care | Payer: Medicare Other | Source: Ambulatory Visit | Attending: Radiation Oncology | Admitting: Radiation Oncology

## 2011-06-01 DIAGNOSIS — C61 Malignant neoplasm of prostate: Secondary | ICD-10-CM | POA: Diagnosis not present

## 2011-06-01 DIAGNOSIS — R350 Frequency of micturition: Secondary | ICD-10-CM | POA: Diagnosis not present

## 2011-06-04 ENCOUNTER — Ambulatory Visit
Admission: RE | Admit: 2011-06-04 | Discharge: 2011-06-04 | Disposition: A | Discharge status: Home / Self Care | Payer: Medicare Other | Source: Ambulatory Visit | Attending: Radiation Oncology | Admitting: Radiation Oncology

## 2011-06-04 ENCOUNTER — Encounter: Payer: Self-pay | Admitting: Radiation Oncology

## 2011-06-04 VITALS — Wt 239.2 lb

## 2011-06-04 DIAGNOSIS — R350 Frequency of micturition: Secondary | ICD-10-CM | POA: Diagnosis not present

## 2011-06-04 DIAGNOSIS — C61 Malignant neoplasm of prostate: Secondary | ICD-10-CM

## 2011-06-04 NOTE — Progress Notes (Signed)
Weekly Management Note:  Site:Prostate Current Dose:  7410  cGy Projected Dose: 7800  cGy  Narrative: The patient is seen today for routine under treatment assessment. CBCT/MVCT images/port films were reviewed. The chart was reviewed.   Doing recently well from a GU and GI standpoint. His bladder filling is satisfactory. He does have 2-3 soft bowel movements a day.  Physical Examination: There were no vitals filed for this visit..  Weight: 239 lb 3.2 oz (108.5 kg). No change.  Impression: Tolerating radiation therapy well.  Plan: Continue radiation therapy as planned.

## 2011-06-04 NOTE — Progress Notes (Signed)
Pt reports BMs soft, 2-3 x daily. Nocturia q 2 hrs which is less than before he began Flomax. No other c/o verbalized.

## 2011-06-05 ENCOUNTER — Ambulatory Visit
Admission: RE | Admit: 2011-06-05 | Discharge: 2011-06-05 | Disposition: A | Discharge status: Home / Self Care | Payer: Medicare Other | Source: Ambulatory Visit | Attending: Radiation Oncology | Admitting: Radiation Oncology

## 2011-06-05 DIAGNOSIS — R350 Frequency of micturition: Secondary | ICD-10-CM | POA: Diagnosis not present

## 2011-06-05 DIAGNOSIS — C61 Malignant neoplasm of prostate: Secondary | ICD-10-CM | POA: Diagnosis not present

## 2011-06-06 ENCOUNTER — Encounter: Payer: Self-pay | Admitting: Radiation Oncology

## 2011-06-06 ENCOUNTER — Ambulatory Visit
Admission: RE | Admit: 2011-06-06 | Discharge: 2011-06-06 | Disposition: A | Discharge status: Home / Self Care | Payer: Medicare Other | Source: Ambulatory Visit | Attending: Radiation Oncology | Admitting: Radiation Oncology

## 2011-06-06 DIAGNOSIS — R350 Frequency of micturition: Secondary | ICD-10-CM | POA: Diagnosis not present

## 2011-06-06 DIAGNOSIS — C61 Malignant neoplasm of prostate: Secondary | ICD-10-CM | POA: Diagnosis not present

## 2011-06-07 NOTE — Progress Notes (Signed)
  Radiation Oncology         ([phone]) [phone] ________________________________  Name: James Dyer MRN: [id number]  Date: 06/06/2011  DOB: May 19, 1945  End of Treatment Note  Diagnosis:   T1 C. intermediate risk adenocarcinoma prostate    Requesting physician: Dr. Marcine Matar  Indication for treatment:  Curative       Radiation treatment dates:  04/12/2011-06/06/2011  Site/dose:   Prostate 7800 cGy 40 sessions, seminal vesicles 5600 cGy 40 sessions  Beams/energy:   6 MV photons dual ARC IMRT  Narrative: The patient tolerated radiation treatment relatively well.   He did well from a GU and GI standpoint although he did have some slowing of the stream and urinary frequency which improved with Flomax.  Plan: The patient has completed radiation treatment. The patient will return to radiation oncology clinic for routine followup in one month. I advised them to call or return sooner if they have any questions or concerns related to their recovery or treatment.  ------------------------------------------------        Maryln Gottron, MD

## 2011-06-11 ENCOUNTER — Encounter: Payer: Self-pay | Admitting: Radiation Oncology

## 2011-06-11 NOTE — Progress Notes (Signed)
Chart note: The patient began his IMRT on 04/12/2011. He was treated with 2 modulated arcs corresponding to one set of IMRT treatment devices [phone])

## 2011-06-15 ENCOUNTER — Telehealth: Payer: Self-pay | Admitting: Radiation Oncology

## 2011-06-15 DIAGNOSIS — C61 Malignant neoplasm of prostate: Secondary | ICD-10-CM | POA: Diagnosis not present

## 2011-06-15 DIAGNOSIS — R82998 Other abnormal findings in urine: Secondary | ICD-10-CM | POA: Diagnosis not present

## 2011-06-15 DIAGNOSIS — R300 Dysuria: Secondary | ICD-10-CM | POA: Diagnosis not present

## 2011-06-15 NOTE — Telephone Encounter (Signed)
06/15/2011 1001 Patient phoned this morning concerned about urinary frequency, having to strain to urinate, pain with urination, and lack of control of urination. Patient states, that during the day he goes to the restroom to void every 20 minutes during the day and every hour at night. Patient reports that he is already taking Flomax bid. Patient reports he is scheduled to see Dr. Retta Diones this coming Monday but, is unsure he can make it the weekend with these symptoms. Dr. Dayton Scrape off today. Dr. Lenoria Chime PA is able to see this patient today at 33. Patient took this appointment. Will call patient on Monday to follow up. Routed this message to [facility], [place] and Dr. Dayton Scrape.

## 2011-06-18 ENCOUNTER — Telehealth: Payer: Self-pay | Admitting: Radiation Oncology

## 2011-06-18 NOTE — Telephone Encounter (Signed)
Call patient at home number listed in demographics to check status since appointment with Dahlstedt on Friday. No answer. Left message encouraging patient to call this writer back.

## 2011-06-19 ENCOUNTER — Telehealth: Payer: Self-pay | Admitting: Radiation Oncology

## 2011-06-19 NOTE — Telephone Encounter (Signed)
Called patient at home number listed in demographics to check status. Patient reports he was seen by an NP at Dr. Lenoria Chime office on 06/15/2011. He was prescribed Pyridium and an antibiotic. Patient reports urgency has improved and burning with urination has gone away completely. Patient reports his symptoms are much better but, "not gone completely. Encouraged patient to give antibiotic more time and to take as directed, not missing any doses. Patient verbalized understanding. Encouraged patient to keep follow up appointment with Dahlstedt on 6/14 and [facility] 6/18. Patient verbalized understanding of all things reviewed. This writer will call patient on Friday to check status.

## 2011-06-21 DIAGNOSIS — H524 Presbyopia: Secondary | ICD-10-CM | POA: Diagnosis not present

## 2011-06-21 DIAGNOSIS — H2589 Other age-related cataract: Secondary | ICD-10-CM | POA: Diagnosis not present

## 2011-06-22 ENCOUNTER — Telehealth: Payer: Self-pay | Admitting: Radiation Oncology

## 2011-06-22 NOTE — Telephone Encounter (Signed)
Called patient at home number listed in demographics to check status. Patient reports that he is feeling much better. Patient states his urinary symptoms have resolved. Encouraged patient to call with future needs and he verbalized understanding. Routed this message to Colen Darling, RN and Dr. Dayton Scrape.

## 2011-06-23 DIAGNOSIS — I10 Essential (primary) hypertension: Secondary | ICD-10-CM | POA: Diagnosis not present

## 2011-06-23 DIAGNOSIS — E785 Hyperlipidemia, unspecified: Secondary | ICD-10-CM | POA: Diagnosis not present

## 2011-07-13 ENCOUNTER — Encounter: Payer: Self-pay | Admitting: Radiation Oncology

## 2011-07-13 DIAGNOSIS — C61 Malignant neoplasm of prostate: Secondary | ICD-10-CM | POA: Diagnosis not present

## 2011-07-13 DIAGNOSIS — Z923 Personal history of irradiation: Secondary | ICD-10-CM | POA: Insufficient documentation

## 2011-07-13 DIAGNOSIS — N400 Enlarged prostate without lower urinary tract symptoms: Secondary | ICD-10-CM | POA: Diagnosis not present

## 2011-07-17 ENCOUNTER — Ambulatory Visit
Admission: RE | Admit: 2011-07-17 | Discharge: 2011-07-17 | Disposition: A | Discharge status: Home / Self Care | Payer: Medicare Other | Source: Ambulatory Visit | Attending: Radiation Oncology | Admitting: Radiation Oncology

## 2011-07-17 ENCOUNTER — Encounter: Payer: Self-pay | Admitting: Radiation Oncology

## 2011-07-17 VITALS — BP 137/81 | HR 71 | Temp 97.4°F | Resp 20 | Wt 235.8 lb

## 2011-07-17 DIAGNOSIS — C61 Malignant neoplasm of prostate: Secondary | ICD-10-CM

## 2011-07-17 NOTE — Progress Notes (Signed)
Followup note:  James Dyer returns today approximately 6 weeks following completion of external beam/IMRT in the management of his stage TI C. intermediate risk adenocarcinoma prostate. He was recently seen by Dr. Retta Diones to start him on Mirabegron, and anti-spasmodic medication. He feels that his urinary frequency and urgency are slightly improved. He has nocturia x1-2. He continues with his Flomax. No GI difficulties.  Physical examination: Not performed.  Impression: Satisfactory progress with improvement in his urinary frequency/nocturia.  Plan: He'll see Dr. Retta Diones in 2 months for a followup visit and PSA determination. I ask that Dr. Retta Diones keep me posted on his progress.

## 2011-07-17 NOTE — Progress Notes (Signed)
Pt still has slight dysuria, is on Mirabegron for this per urologist. Urinary freq comes and goes, urgency, nocturia x 1-2. Fatigue improving. Pt working more than he was during tx.

## 2011-08-26 ENCOUNTER — Other Ambulatory Visit: Payer: Self-pay | Admitting: Radiation Oncology

## 2011-09-26 DIAGNOSIS — N400 Enlarged prostate without lower urinary tract symptoms: Secondary | ICD-10-CM | POA: Diagnosis not present

## 2011-09-26 DIAGNOSIS — C61 Malignant neoplasm of prostate: Secondary | ICD-10-CM | POA: Diagnosis not present

## 2011-12-18 DIAGNOSIS — C61 Malignant neoplasm of prostate: Secondary | ICD-10-CM | POA: Diagnosis not present

## 2011-12-25 ENCOUNTER — Ambulatory Visit (INDEPENDENT_AMBULATORY_CARE_PROVIDER_SITE_OTHER): Payer: Medicare Other | Admitting: Urology

## 2011-12-25 DIAGNOSIS — C61 Malignant neoplasm of prostate: Secondary | ICD-10-CM

## 2011-12-25 DIAGNOSIS — N400 Enlarged prostate without lower urinary tract symptoms: Secondary | ICD-10-CM | POA: Diagnosis not present

## 2012-03-14 ENCOUNTER — Encounter (HOSPITAL_COMMUNITY): Payer: Self-pay

## 2012-03-14 ENCOUNTER — Emergency Department (HOSPITAL_COMMUNITY)
Admission: EM | Admit: 2012-03-14 | Discharge: 2012-03-14 | Disposition: A | Discharge status: Home / Self Care | Payer: Medicare Other | Attending: Emergency Medicine | Admitting: Emergency Medicine

## 2012-03-14 ENCOUNTER — Emergency Department (HOSPITAL_COMMUNITY): Payer: Medicare Other

## 2012-03-14 ENCOUNTER — Other Ambulatory Visit: Payer: Self-pay

## 2012-03-14 DIAGNOSIS — Z862 Personal history of diseases of the blood and blood-forming organs and certain disorders involving the immune mechanism: Secondary | ICD-10-CM | POA: Insufficient documentation

## 2012-03-14 DIAGNOSIS — Z8639 Personal history of other endocrine, nutritional and metabolic disease: Secondary | ICD-10-CM | POA: Insufficient documentation

## 2012-03-14 DIAGNOSIS — Z87442 Personal history of urinary calculi: Secondary | ICD-10-CM | POA: Diagnosis not present

## 2012-03-14 DIAGNOSIS — I10 Essential (primary) hypertension: Secondary | ICD-10-CM | POA: Insufficient documentation

## 2012-03-14 DIAGNOSIS — Z79899 Other long term (current) drug therapy: Secondary | ICD-10-CM | POA: Diagnosis not present

## 2012-03-14 DIAGNOSIS — I201 Angina pectoris with documented spasm: Secondary | ICD-10-CM | POA: Diagnosis not present

## 2012-03-14 DIAGNOSIS — Z8719 Personal history of other diseases of the digestive system: Secondary | ICD-10-CM | POA: Diagnosis not present

## 2012-03-14 DIAGNOSIS — C61 Malignant neoplasm of prostate: Secondary | ICD-10-CM | POA: Diagnosis not present

## 2012-03-14 DIAGNOSIS — Z923 Personal history of irradiation: Secondary | ICD-10-CM | POA: Diagnosis not present

## 2012-03-14 DIAGNOSIS — Z87891 Personal history of nicotine dependence: Secondary | ICD-10-CM | POA: Diagnosis not present

## 2012-03-14 DIAGNOSIS — R079 Chest pain, unspecified: Secondary | ICD-10-CM | POA: Insufficient documentation

## 2012-03-14 HISTORY — DX: Prediabetes: R73.03

## 2012-03-14 LAB — CBC WITH DIFFERENTIAL/PLATELET
Basophils Absolute: 0 10*3/uL (ref 0.0–0.1)
Basophils Relative: 0 % (ref 0–1)
Eosinophils Absolute: 0.3 10*3/uL (ref 0.0–0.7)
Eosinophils Relative: 3 % (ref 0–5)
MCH: 29.8 pg (ref 26.0–34.0)
MCHC: 34.3 g/dL (ref 30.0–36.0)
MCV: 86.9 fL (ref 78.0–100.0)
Platelets: 234 10*3/uL (ref 150–400)
RDW: 12.6 % (ref 11.5–15.5)

## 2012-03-14 LAB — BASIC METABOLIC PANEL
Calcium: 9.9 mg/dL (ref 8.4–10.5)
GFR calc Af Amer: >90 mL/min (ref >90)
GFR calc non Af Amer: 88 mL/min — ABNORMAL LOW (ref >90)
Glucose, Bld: 104 mg/dL — ABNORMAL HIGH (ref 70–99)
Sodium: 139 mEq/L (ref 135–145)

## 2012-03-14 NOTE — ED Provider Notes (Signed)
History     CSN: [id number]  Arrival date & time 03/14/12  1018   First MD Initiated Contact with Patient 03/14/12 1031      Chief Complaint  Patient presents with  . Chest Pain    (Consider location/radiation/quality/duration/timing/severity/associated sxs/prior treatment) HPI 67 year old male is gradual onset mild constant nonradiating well localized aching left-sided anterior chest pain worse with certain position changes and nonexertional nonpleuritic in without associated symptoms, started gradually over 2 days ago he's had more than 48 hours of constant mild discomfort, it was not better with antacids, he does not want pain medicines in the emergency department, he is no fever cough shortness of breath abdominal pain nausea vomiting sweats, he is no headache or confusion, he is no change in speech or vision, he is no weakness or numbness, the nursing triage note stated he was short of breath but the patient denies this to me. Past Medical History  Diagnosis Date  . Hepatitis   . Hypertension   . Hypercholesterolemia   . Nephrolithiasis   . 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 Borderline diabetes     Past Surgical History  Procedure Laterality Date  . Foot surgery    . Prostate surgery      No family history on file.  History  Substance Use Topics  . Smoking status: Former Smoker -- 1.00 packs/day for 5 years    Types: Cigarettes    Quit date: 12/18/1966  . Smokeless tobacco: Not on file  . Alcohol Use: No      Review of Systems 10 Systems reviewed and are negative for acute change except as noted in the HPI. Allergies  Review of patient's allergies indicates no known allergies.  Home Medications   Current Outpatient Rx  Name  Route  Sig  Dispense  Refill  . cholecalciferol (VITAMIN D) 1000 UNITS tablet   Oral   Take 1,000 Units by mouth daily.           . enalapril (VASOTEC) 10 MG tablet   Oral   Take 10  mg by mouth daily.           . hydrocortisone cream 1 %   Topical   Apply 1 application topically 2 (two) times daily.         . Tamsulosin HCl (FLOMAX) 0.4 MG CAPS      TAKE 1 CAPSULE BY MOUTH TWICE DAILY   60 capsule   1     BP 141/86  Pulse 63  Temp(Src) 98.1 F (36.7 C) (Oral)  Resp 16  Ht 5\' 9"  (1.753 m)  Wt 250 lb (113.399 kg)  BMI 36.9 kg/m2  SpO2 99%  Physical Exam  Nursing note and vitals reviewed. Constitutional:  Awake, alert, nontoxic appearance.  HENT:  Head: Atraumatic.  Eyes: Right eye exhibits no discharge. Left eye exhibits no discharge.  Neck: Neck supple.  Cardiovascular: Normal rate and regular rhythm.   No murmur heard. Pulmonary/Chest: Effort normal and breath sounds normal. No respiratory distress. He has no wheezes. He has no rales. He exhibits tenderness.  Reproducible pain with left parasternal anterior chest wall tenderness  Abdominal: Soft. There is no tenderness. There is no rebound.  Musculoskeletal: He exhibits no edema and no tenderness.  Baseline ROM, no obvious new focal weakness.  Neurological: He is alert.  Mental status and motor strength appears baseline for patient and situation.  Skin: No rash noted.  Psychiatric: He  has a normal mood and affect.    ED Course  Procedures (including critical care time) ECG: Normal sinus rhythm, ventricular rate 74, normal axis, normal intervals, no acute ischemic changes noted, no significant change noted compared with ECG from 1995 that he brought in with him to the ED  Pt stable in ED with no significant deterioration in condition. Labs Reviewed  BASIC METABOLIC PANEL - Abnormal; Notable for the following:    Glucose, Bld 104 (*)    GFR calc non Af Amer 88 (*)    All other components within normal limits  TROPONIN I  CBC WITH DIFFERENTIAL   No results found.   1. Chest pain with low risk of acute coronary syndrome       MDM  Patient / Family / Caregiver informed of  clinical course, understand medical decision-making process, and agree with plan.    I doubt any other EMC precluding discharge at this time including, but not necessarily limited to the following:ACS.         Hurman Horn, MD 03/24/12 [id number]

## 2012-03-14 NOTE — ED Notes (Signed)
Pt given aspirin pta by urgent care.

## 2012-03-14 NOTE — ED Notes (Signed)
Pt reports mid-sternal cp since Wednesday, around lunch, pain has constant, sob at times, no fever, no cough, denies any any n/v, was sent by urgent care office.

## 2012-04-22 ENCOUNTER — Ambulatory Visit (INDEPENDENT_AMBULATORY_CARE_PROVIDER_SITE_OTHER): Payer: Medicare Other | Admitting: Urology

## 2012-04-22 DIAGNOSIS — N529 Male erectile dysfunction, unspecified: Secondary | ICD-10-CM

## 2012-04-22 DIAGNOSIS — N400 Enlarged prostate without lower urinary tract symptoms: Secondary | ICD-10-CM | POA: Diagnosis not present

## 2012-04-22 DIAGNOSIS — C61 Malignant neoplasm of prostate: Secondary | ICD-10-CM

## 2012-07-01 DIAGNOSIS — E785 Hyperlipidemia, unspecified: Secondary | ICD-10-CM | POA: Diagnosis not present

## 2012-07-01 DIAGNOSIS — E119 Type 2 diabetes mellitus without complications: Secondary | ICD-10-CM | POA: Diagnosis not present

## 2012-07-01 DIAGNOSIS — Z79899 Other long term (current) drug therapy: Secondary | ICD-10-CM | POA: Diagnosis not present

## 2012-07-01 DIAGNOSIS — I10 Essential (primary) hypertension: Secondary | ICD-10-CM | POA: Diagnosis not present

## 2012-07-01 DIAGNOSIS — Z6837 Body mass index (BMI) 37.0-37.9, adult: Secondary | ICD-10-CM | POA: Diagnosis not present

## 2012-07-10 DIAGNOSIS — Z86010 Personal history of colonic polyps: Secondary | ICD-10-CM | POA: Diagnosis not present

## 2012-07-10 NOTE — H&P (Signed)
  NTS SOAP Note  Vital Signs:  Vitals as of: 07/10/2012: Systolic 158: Diastolic 93: Heart Rate 75: Temp 98.62F: Height 59ft 10in: Weight 256Lbs 0 Ounces: BMI 36.73  BMI : 36.73 kg/m2  Subjective: This 16 Years 11 Months old Male presents for follow up colonoscopy.  Had a TCS with polypectomy in 2010.  Presents for follow up colonoscopy.  No new gi complaints.  No family h/o colon cancer.  Review of Symptoms:  Constitutional:unremarkable   Head:unremarkable    Eyes:unremarkable   Nose/Mouth/Throat:unremarkable Cardiovascular:  unremarkable   Respiratory:unremarkable   Gastrointestinal:  unremarkable   Genitourinary:unremarkable     Musculoskeletal:unremarkable   Skin:unremarkable Hematolgic/Lymphatic:unremarkable     Allergic/Immunologic:unremarkable     Past Medical History:    Reviewed   Past Medical History  Surgical History: prostate biopsy Medical Problems: prostate cancer, HTN Allergies: nkda Medications: enalapril, tamulosin   Social History:Reviewed  Social History  Preferred Language: English Race:  White Ethnicity: Not Hispanic / Latino Age: 67 Years 6 Months Marital Status:  M Alcohol:  No Recreational drug(s):  No   Smoking Status: Never smoker reviewed on 07/10/2012 Functional Status reviewed on mm/dd/yyyy ------------------------------------------------ Bathing: Normal Cooking: Normal Dressing: Normal Driving: Normal Eating: Normal Managing Meds: Normal Oral Care: Normal Shopping: Normal Toileting: Normal Transferring: Normal Walking: Normal Cognitive Status reviewed on mm/dd/yyyy ------------------------------------------------ Attention: Normal Decision Making: Normal Language: Normal Memory: Normal Motor: Normal Perception: Normal Problem Solving: Normal Visual and Spatial: Normal   Family History:  Reviewed  Family Health History Mother, Deceased; Cancer unspecified;  Father,  Deceased; History Unknown    Objective Information: General:  Well appearing, well nourished in no distress. Neck:  Supple without lymphadenopathy.  Heart:  RRR, no murmur Lungs:    CTA bilaterally, no wheezes, rhonchi, rales.  Breathing unlabored. Abdomen:Soft, NT/ND, no HSM, no masses.   deferred to procedure.  Assessment:h/o colon polyps  Diagnosis &amp; Procedure Smart Code   Plan:Scheduled for TCS on 08/17/12.   Patient Education:Alternative treatments to surgery were discussed with patient (and family).  Risks and benefits  of procedure were fully explained to the patient (and family) who gave informed consent. Patient/family questions were addressed.  Follow-up:Pending Surgery

## 2012-07-22 ENCOUNTER — Encounter (HOSPITAL_COMMUNITY): Payer: Self-pay | Admitting: *Deleted

## 2012-07-22 ENCOUNTER — Encounter (HOSPITAL_COMMUNITY)
Admission: RE | Disposition: A | Discharge status: Home / Self Care | Payer: Self-pay | Source: Ambulatory Visit | Attending: General Surgery

## 2012-07-22 ENCOUNTER — Ambulatory Visit (HOSPITAL_COMMUNITY)
Admission: RE | Admit: 2012-07-22 | Discharge: 2012-07-22 | Disposition: A | Discharge status: Home / Self Care | Payer: Medicare Other | Source: Ambulatory Visit | Attending: General Surgery | Admitting: General Surgery

## 2012-07-22 DIAGNOSIS — C61 Malignant neoplasm of prostate: Secondary | ICD-10-CM | POA: Insufficient documentation

## 2012-07-22 DIAGNOSIS — I10 Essential (primary) hypertension: Secondary | ICD-10-CM | POA: Diagnosis not present

## 2012-07-22 DIAGNOSIS — Z860100 Personal history of colon polyps, unspecified: Secondary | ICD-10-CM | POA: Insufficient documentation

## 2012-07-22 DIAGNOSIS — Z09 Encounter for follow-up examination after completed treatment for conditions other than malignant neoplasm: Secondary | ICD-10-CM | POA: Diagnosis not present

## 2012-07-22 HISTORY — PX: COLONOSCOPY: SHX5424

## 2012-07-22 SURGERY — COLONOSCOPY
Anesthesia: Moderate Sedation

## 2012-07-22 MED ORDER — MEPERIDINE HCL 100 MG/ML IJ SOLN
INTRAMUSCULAR | Status: AC
  Filled 2012-07-22: qty 1

## 2012-07-22 MED ORDER — MEPERIDINE HCL 25 MG/ML IJ SOLN
INTRAMUSCULAR | Status: DC | PRN
  Administered 2012-07-22: 50 mg via INTRAVENOUS

## 2012-07-22 MED ORDER — MIDAZOLAM HCL 5 MG/5ML IJ SOLN
INTRAMUSCULAR | Status: DC | PRN
  Administered 2012-07-22: 3 mg via INTRAVENOUS
  Administered 2012-07-22: 1 mg via INTRAVENOUS

## 2012-07-22 MED ORDER — MIDAZOLAM HCL 5 MG/5ML IJ SOLN
INTRAMUSCULAR | Status: AC
  Filled 2012-07-22: qty 10

## 2012-07-22 MED ORDER — SODIUM CHLORIDE 0.9 % IV SOLN
INTRAVENOUS | Status: DC
  Administered 2012-07-22: 1000 mL via INTRAVENOUS

## 2012-07-22 MED ORDER — STERILE WATER FOR IRRIGATION IR SOLN
Status: DC | PRN
  Administered 2012-07-22: 08:00:00

## 2012-07-22 NOTE — Op Note (Signed)
[facility] [address] [place], [address]   COLONOSCOPY PROCEDURE REPORT  PATIENT: James, Dyer  MR#: [id number] BIRTHDATE: July 12, 1945 , 66  yrs. old GENDER: Male ENDOSCOPIST: Franky Macho, MD REFERRED XB:[id number], John PROCEDURE DATE:  07/22/2012 PROCEDURE:   Colonoscopy, surveillance ASA CLASS:   Class II INDICATIONS:Patient's personal history of colon polyps. MEDICATIONS: Versed 3 mg IV and Demerol 50 mg IV  DESCRIPTION OF PROCEDURE:   After the risks benefits and alternatives of the procedure were thoroughly explained, informed consent was obtained.  A digital rectal exam revealed no abnormalities of the rectum.   The EC-[id number] ([id number])  endoscope was introduced through the anus and advanced to the cecum, which was identified by both the appendix and ileocecal valve. No adverse events experienced.   The quality of the prep was adequate, using MoviPrep  The instrument was then slowly withdrawn as the colon was fully examined.      COLON FINDINGS: A normal appearing cecum, ileocecal valve, and appendiceal orifice were identified.  The ascending, hepatic flexure, transverse, splenic flexure, descending, sigmoid colon and rectum appeared unremarkable.  No polyps or cancers were seen. Retroflexed views revealed no abnormalities. The time to cecum=3 minutes 0 seconds.  Withdrawal time=4 minutes 0 seconds.  The scope was withdrawn and the procedure completed. COMPLICATIONS: There were no complications.  ENDOSCOPIC IMPRESSION: Normal colon  RECOMMENDATIONS: Repeat Colonscopy in 10 years.   eSigned:  Franky Macho, MD 07/22/2012 8:37 AM   cc:

## 2012-07-22 NOTE — Interval H&P Note (Signed)
History and Physical Interval Note:  07/22/2012 8:21 AM  James Dyer  has presented today for surgery, with the diagnosis of h/o colon polyps  The various methods of treatment have been discussed with the patient and family. After consideration of risks, benefits and other options for treatment, the patient has consented to  Procedure(s): COLONOSCOPY (N/A) as a surgical intervention .  The patient's history has been reviewed, patient examined, no change in status, stable for surgery.  I have reviewed the patient's chart and labs.  Questions were answered to the patient's satisfaction.     Franky Macho A

## 2012-07-24 ENCOUNTER — Encounter (HOSPITAL_COMMUNITY): Payer: Self-pay | Admitting: General Surgery

## 2012-08-26 ENCOUNTER — Emergency Department (HOSPITAL_COMMUNITY): Payer: Medicare Other

## 2012-08-26 ENCOUNTER — Encounter (HOSPITAL_COMMUNITY): Payer: Self-pay | Admitting: *Deleted

## 2012-08-26 ENCOUNTER — Ambulatory Visit (INDEPENDENT_AMBULATORY_CARE_PROVIDER_SITE_OTHER): Payer: Medicare Other | Admitting: Urology

## 2012-08-26 ENCOUNTER — Emergency Department (HOSPITAL_COMMUNITY)
Admission: EM | Admit: 2012-08-26 | Discharge: 2012-08-26 | Disposition: A | Discharge status: Home / Self Care | Payer: Medicare Other | Attending: Emergency Medicine | Admitting: Emergency Medicine

## 2012-08-26 DIAGNOSIS — M25519 Pain in unspecified shoulder: Secondary | ICD-10-CM | POA: Diagnosis not present

## 2012-08-26 DIAGNOSIS — S46909A Unspecified injury of unspecified muscle, fascia and tendon at shoulder and upper arm level, unspecified arm, initial encounter: Secondary | ICD-10-CM | POA: Diagnosis not present

## 2012-08-26 DIAGNOSIS — Y929 Unspecified place or not applicable: Secondary | ICD-10-CM | POA: Insufficient documentation

## 2012-08-26 DIAGNOSIS — C61 Malignant neoplasm of prostate: Secondary | ICD-10-CM

## 2012-08-26 DIAGNOSIS — Z79899 Other long term (current) drug therapy: Secondary | ICD-10-CM | POA: Insufficient documentation

## 2012-08-26 DIAGNOSIS — M25512 Pain in left shoulder: Secondary | ICD-10-CM

## 2012-08-26 DIAGNOSIS — N400 Enlarged prostate without lower urinary tract symptoms: Secondary | ICD-10-CM | POA: Diagnosis not present

## 2012-08-26 DIAGNOSIS — Z923 Personal history of irradiation: Secondary | ICD-10-CM | POA: Diagnosis not present

## 2012-08-26 DIAGNOSIS — Z8639 Personal history of other endocrine, nutritional and metabolic disease: Secondary | ICD-10-CM | POA: Insufficient documentation

## 2012-08-26 DIAGNOSIS — W010XXA Fall on same level from slipping, tripping and stumbling without subsequent striking against object, initial encounter: Secondary | ICD-10-CM | POA: Insufficient documentation

## 2012-08-26 DIAGNOSIS — S4980XA Other specified injuries of shoulder and upper arm, unspecified arm, initial encounter: Secondary | ICD-10-CM | POA: Diagnosis not present

## 2012-08-26 DIAGNOSIS — Z87442 Personal history of urinary calculi: Secondary | ICD-10-CM | POA: Diagnosis not present

## 2012-08-26 DIAGNOSIS — N529 Male erectile dysfunction, unspecified: Secondary | ICD-10-CM | POA: Diagnosis not present

## 2012-08-26 DIAGNOSIS — Z862 Personal history of diseases of the blood and blood-forming organs and certain disorders involving the immune mechanism: Secondary | ICD-10-CM | POA: Diagnosis not present

## 2012-08-26 DIAGNOSIS — Z87891 Personal history of nicotine dependence: Secondary | ICD-10-CM | POA: Insufficient documentation

## 2012-08-26 DIAGNOSIS — I10 Essential (primary) hypertension: Secondary | ICD-10-CM | POA: Diagnosis not present

## 2012-08-26 DIAGNOSIS — Y939 Activity, unspecified: Secondary | ICD-10-CM | POA: Insufficient documentation

## 2012-08-26 MED ORDER — HYDROCODONE-ACETAMINOPHEN 5-325 MG PO TABS: ORAL_TABLET | ORAL | Status: DC

## 2012-08-26 NOTE — ED Provider Notes (Signed)
CSN: [id number]     Arrival date & time 08/26/12  0941 History     First MD Initiated Contact with Patient 08/26/12 1002     Chief Complaint  Patient presents with  . Shoulder Pain   (Consider location/radiation/quality/duration/timing/severity/associated sxs/prior Treatment) HPI Comments: James Dyer is a 67 y.o. male who presents to the Emergency Department complaining of left shoulder pain.  States the pain began after a fall on the evening prior to ED arrival.  Reports landing on the shoulder and now has pain with movement of the left arm.  He has taken aleve w/o improvement.  Pain improves when holding the left arm close to his chest.  He denies neck or chest pain, numbness or weakness of the arm,  head injury or LOC.    The history is provided by the patient.    Past Medical History  Diagnosis Date  . Hepatitis   . Hypertension   . Hypercholesterolemia   . Nephrolithiasis   . 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 Borderline diabetes    Past Surgical History  Procedure Laterality Date  . Foot surgery    . Prostate surgery    . Colonoscopy N/A 07/22/2012    Procedure: COLONOSCOPY;  Surgeon: Dalia Heading, MD;  Location: [facility];  Service: Gastroenterology;  Laterality: N/A;   No family history on file. History  Substance Use Topics  . Smoking status: Former Smoker -- 1.00 packs/day for 5 years    Types: Cigarettes    Quit date: 12/18/1966  . Smokeless tobacco: Not on file  . Alcohol Use: No    Review of Systems  Constitutional: Negative for fever and chills.  HENT: Negative for neck stiffness.   Respiratory: Negative for shortness of breath.   Cardiovascular: Negative for chest pain.  Genitourinary: Negative for dysuria and difficulty urinating.  Musculoskeletal: Positive for arthralgias. Negative for back pain and joint swelling.  Skin: Negative for color change and wound.  All other systems reviewed and are  negative.    Allergies  Review of patient's allergies indicates no known allergies.  Home Medications   Current Outpatient Rx  Name  Route  Sig  Dispense  Refill  . enalapril (VASOTEC) 10 MG tablet   Oral   Take 10 mg by mouth daily.           . naproxen sodium (ALEVE) 220 MG tablet   Oral   Take 220 mg by mouth 2 (two) times daily with a meal.         . tamsulosin (FLOMAX) 0.4 MG CAPS   Oral   Take 0.4 mg by mouth daily.          BP 109/75  Pulse 89  Temp(Src) 98.4 F (36.9 C) (Oral)  Resp 20  Ht 5\' 10"  (1.778 m)  Wt 243 lb (110.224 kg)  BMI 34.87 kg/m2  SpO2 96% Physical Exam  Nursing note and vitals reviewed. Constitutional: He is oriented to person, place, and time. He appears well-developed and well-nourished. No distress.  HENT:  Head: Normocephalic and atraumatic.  Neck: Normal range of motion. Neck supple. No thyromegaly present.  Cardiovascular: Normal rate, regular rhythm, normal heart sounds and intact distal pulses.   No murmur heard. Pulmonary/Chest: Effort normal and breath sounds normal. No respiratory distress. He exhibits no tenderness.  Musculoskeletal: He exhibits tenderness. He exhibits no edema.       Left shoulder: He exhibits  decreased range of motion, tenderness and pain. He exhibits no bony tenderness, no swelling, no effusion, no crepitus, no deformity, no laceration, no spasm, normal pulse and normal strength.       Arms: ttp of the anterior left shoulder.  Pain with abduction of the left arm and rotation of the shoulder.  Radial pulse is brisk, distal sensation intact, CR< 2 sec.  No abrasions, edema or step off deformity of the joint.   Lymphadenopathy:    He has no cervical adenopathy.  Neurological: He is alert and oriented to person, place, and time. He has normal strength. No sensory deficit. He exhibits normal muscle tone. Coordination normal.  Reflex Scores:      Tricep reflexes are 2+ on the right side and 2+ on the left  side.      Bicep reflexes are 2+ on the right side and 2+ on the left side. Skin: Skin is warm and dry.    ED Course   Procedures (including critical care time)  Labs Reviewed - No data to display Dg Shoulder Left  08/26/2012   *RADIOLOGY REPORT*  Clinical Data: History of injury from fall.  History of pain.  LEFT SHOULDER - 2+ VIEW  Comparison: None.  Findings: Alignment is normal.  Joint spaces are preserved.  No fracture or dislocation is evident.  No soft tissue lesions are seen.  There is degenerative roughening of the greater tuberosity of the humeral head.  IMPRESSION: No fracture or dislocation is seen.  Degenerative roughening of the greater tuberosity humeral head.  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   Original Report Authenticated By: Onalee Hua Call     MDM     X-rays reviewed with patient. Vital signs are stable he is well appearing. anterior left shoulder pain that is reproduced with abduction of the arm. Remains neurovascularly intact. No distal tenderness. No obvious bony deformity of the joint. Patient requests referral information for [facility] orthopedics.  Sling given for comfort with extended use precautions also given. Patient agrees to ice and close orthopedic followup.  VSS, pt appears stable for d/c  Haadiya Frogge L. Trisha Mangle, PA-C 08/27/12 2129

## 2012-08-26 NOTE — ED Notes (Signed)
Tripped last night, landing on left shoulder.  C/o pain.  Denies hitting head/loc.

## 2012-08-27 DIAGNOSIS — M75120 Complete rotator cuff tear or rupture of unspecified shoulder, not specified as traumatic: Secondary | ICD-10-CM | POA: Diagnosis not present

## 2012-08-28 DIAGNOSIS — M75120 Complete rotator cuff tear or rupture of unspecified shoulder, not specified as traumatic: Secondary | ICD-10-CM | POA: Diagnosis not present

## 2012-08-29 ENCOUNTER — Encounter (HOSPITAL_COMMUNITY): Payer: Self-pay | Admitting: Pharmacy Technician

## 2012-08-29 DIAGNOSIS — M75120 Complete rotator cuff tear or rupture of unspecified shoulder, not specified as traumatic: Secondary | ICD-10-CM | POA: Diagnosis not present

## 2012-08-29 NOTE — Progress Notes (Signed)
Need orders in EPIC.  Surgery scheduled for 09/09/12.  Thank You.

## 2012-08-31 NOTE — ED Provider Notes (Signed)
Medical screening examination/treatment/procedure(s) were performed by non-physician practitioner and as supervising physician I was immediately available for consultation/collaboration.   Keleigh Kazee Ann Mackenzi Krogh, MD 08/31/12 0210 

## 2012-09-04 NOTE — Progress Notes (Signed)
Doyou really want type and screen with pre op? left rotator cuff repair, please fax Othman Masur [phone] attention Niara Bunker or call and let me know phone [phone]. Pre op is 09-09-2012 1030 am. thanks

## 2012-09-04 NOTE — Patient Instructions (Addendum)
20 James Dyer  09/04/2012   Your procedure is scheduled on: 09-09-2012 1015  Report to [facility] at 1015 AM.  Call this number if you have problems the morning of surgery [phone]   Remember:   Do not eat food or drink liquids  :After Midnight.  Take these medicines the morning of surgery with A SIP OF WATER: no meds to take                                SEE [facility] PREPARING FOR SURGERY SHEET   Do not wear jewelry, make-up or nail polish.  Do not wear lotions, powders, or perfumes. You may wear deodorant.   Men may shave face and neck.  Do not bring valuables to the hospital. [facility] IS NOT RESPONSIBLE FOR VALUEABLES.  Contacts, dentures or bridgework may not be worn into surgery.  Leave suitcase in the car. After surgery it may be brought to your room.  For patients admitted to the hospital, checkout time is 11:00 AM the day of discharge.   Patients discharged the day of surgery will not be allowed to drive home.  Name and phone number of your driver:  Special Instructions: N/A   Please read over the following fact sheets that you were given: MRSA Information.  Call Cain Sieve RN pre op nurse if needed [phone]    FAILURE TO FOLLOW THESE INSTRUCTIONS MAY RESULT IN THE CANCELLATION OF YOUR SURGERY.  PATIENT SIGNATURE___________________________________________  NURSE SIGNATURE_____________________________________________

## 2012-09-04 NOTE — Progress Notes (Signed)
ekg and chext xray 2 view 03-14-2012 epic

## 2012-09-05 ENCOUNTER — Encounter (HOSPITAL_COMMUNITY)
Admission: RE | Admit: 2012-09-05 | Discharge: 2012-09-05 | Disposition: A | Discharge status: Home / Self Care | Payer: Medicare Other | Source: Ambulatory Visit | Attending: Orthopedic Surgery | Admitting: Orthopedic Surgery

## 2012-09-05 ENCOUNTER — Encounter (HOSPITAL_COMMUNITY): Payer: Self-pay

## 2012-09-05 DIAGNOSIS — Z01812 Encounter for preprocedural laboratory examination: Secondary | ICD-10-CM | POA: Insufficient documentation

## 2012-09-05 HISTORY — DX: Malignant (primary) neoplasm, unspecified: C80.1

## 2012-09-05 HISTORY — DX: Foot drop, left foot: M21.372

## 2012-09-05 HISTORY — DX: Foot drop, right foot: M21.371

## 2012-09-05 LAB — COMPREHENSIVE METABOLIC PANEL
ALT: 20 U/L (ref 0–53)
AST: 19 U/L (ref 0–37)
Albumin: 3.6 g/dL (ref 3.5–5.2)
Alkaline Phosphatase: 55 U/L (ref 39–117)
BUN: 20 mg/dL (ref 6–23)
CO2: 27 mEq/L (ref 19–32)
Calcium: 9.5 mg/dL (ref 8.4–10.5)
Chloride: 100 mEq/L (ref 96–112)
Creatinine, Ser: 0.89 mg/dL (ref 0.50–1.35)
GFR calc Af Amer: >90 mL/min (ref >90)
GFR calc non Af Amer: 87 mL/min — ABNORMAL LOW (ref >90)
Glucose, Bld: 126 mg/dL — ABNORMAL HIGH (ref 70–99)
Potassium: 3.5 mEq/L (ref 3.5–5.1)
Sodium: 136 mEq/L (ref 135–145)
Total Bilirubin: 0.3 mg/dL (ref 0.3–1.2)
Total Protein: 6.9 g/dL (ref 6.0–8.3)

## 2012-09-05 LAB — URINALYSIS, ROUTINE W REFLEX MICROSCOPIC
Bilirubin Urine: NEGATIVE
Glucose, UA: NEGATIVE mg/dL
Hgb urine dipstick: NEGATIVE
Ketones, ur: NEGATIVE mg/dL
Leukocytes, UA: NEGATIVE
Nitrite: NEGATIVE
Protein, ur: NEGATIVE mg/dL
Specific Gravity, Urine: 1.024 (ref 1.005–1.030)
Urobilinogen, UA: 0.2 mg/dL (ref 0.0–1.0)
pH: 7 (ref 5.0–8.0)

## 2012-09-05 LAB — CBC
MCH: 30.4 pg (ref 26.0–34.0)
MCHC: 35 g/dL (ref 30.0–36.0)
Platelets: 220 10*3/uL (ref 150–400)
RBC: 4.51 MIL/uL (ref 4.22–5.81)
RDW: 12.5 % (ref 11.5–15.5)

## 2012-09-05 LAB — PROTIME-INR
INR: 0.97 (ref 0.00–1.49)
Prothrombin Time: 12.7 seconds (ref 11.6–15.2)

## 2012-09-05 LAB — APTT: aPTT: 29 seconds (ref 24–37)

## 2012-09-05 NOTE — Progress Notes (Signed)
09/05/12 1053  OBSTRUCTIVE SLEEP APNEA  Have you ever been diagnosed with sleep apnea through a sleep study? No  Do you snore loudly (loud enough to be heard through closed doors)?  1  Do you often feel tired, fatigued, or sleepy during the daytime? 1  Has anyone observed you stop breathing during your sleep? 1  Do you have, or are you being treated for high blood pressure? 1  BMI more than 35 kg/m2? 1  Age over 67 years old? 1  Neck circumference greater than 40 cm/18 inches? 0  Obstructive Sleep Apnea Score 6  Score 4 or greater  Results sent to PCP

## 2012-09-05 NOTE — H&P (Signed)
James Dyer DOB: 01-15-1946 Male  Chief Complaint: left shoulder pain  History of Present Illness The patient is a 67 year old male who presents with shoulder complaints. They are right handed and present today reporting pain at the left shoulder that began 2 weeks ago. The patient reports that the shoulder symptoms began following a specific injury. The injury resulted from a fall onto the shoulder while running from bees. The patient reports symptoms which include shoulder pain, decreased range of motion and inability to lay on that side. The patient reports symptoms that radiate to the left upper arm. The patient describes these symptoms as moderate in severity. Symptoms are exacerbated by motion at the shoulder and lifting. The patient was previously evaluated in the emergency room 2 day(s) ago. Presentation at that time included shoulder injury and shoulder pain. Past evaluation has included shoulder x-rays. Past treatment has included opioid analgesics. MRI shows a complete tear of the left rotator cuff with retraction.   Allergies No Known Drug Allergies.    Family History Cancer. father   Social History Alcohol use. never consumed alcohol Children. 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 married Tobacco use. former smoker   Medication History Enalapril Maleate (20MG  Tablet, Oral) Active. Tamsulosin HCl (0.4MG  Capsule, Oral) Active.   Past Surgical History Colon Polyp Removal - Open   Past Medical History Hypertension   Review of Systems General:Not Present- Chills, Fever, Night Sweats, Appetite Loss, Fatigue, Feeling sick, Weight Gain and Weight Loss. Skin:Not Present- Itching, Rash, Skin Color Changes, Ulcer, Psoriasis and Change in Hair or Nails. HEENT:Present- Nose Bleed. Not Present- Sensitivity to light, Hearing problems and Ringing in the Ears. Neck:Not Present- Swollen Glands  and Neck Mass. Respiratory:Not Present- Snoring, Chronic Cough, Bloody sputum and Dyspnea. Cardiovascular:Not Present- Shortness of Breath, Chest Pain, Swelling of Extremities, Leg Cramps and Palpitations. Gastrointestinal:Not Present- Bloody Stool, Heartburn, Abdominal Pain, Vomiting, Nausea and Incontinence of Stool. Male Genitourinary:Not Present- Blood in Urine, Frequency, Incontinence and Nocturia. Musculoskeletal:Not Present- Muscle Weakness, Muscle Pain, Joint Stiffness, Joint Swelling, Joint Pain and Back Pain. Neurological:Not Present- Tingling, Numbness, Burning, Tremor, Headaches and Dizziness. Psychiatric:Not Present- Anxiety, Depression and Memory Loss. Endocrine:Not Present- Cold Intolerance, Heat Intolerance, Excessive hunger and Excessive Thirst. Hematology:Not Present- Abnormal Bleeding, Anemia, Blood Clots and Easy Bruising.   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 In regards to the left shoulder, he has no abduction of his left shoulder at all. He can do some shoulder flexion and extension. He is not tender over the axillary nerve. His axilla is free of any masses. There is no supraclavicular nodes. His AC joint is well located. His shoulder is well located. He has good biceps, triceps, and good function in his hand. No circulatory issues.Lungs are clear to auscultation. Normal sinus rhythm with no murmur. Oral cavity negative. EOM intact. Abdomen soft and nontender. Bowel sound active. Neck supple.     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  We may or may not need to use a graft material that is made from  calf skin. This is approved by the FDA and I have not had a rejection  of that graft yet. Also, we may need to use anchors. These are polyethylene anchors that stay in the bone and we use those anchors to suture the tendon down in certain cases where the tendon is completely pulled off the bone. There is always a chance of a secondary infection obviously with any surgery but we do use antibiotics preop. Risks and benefits discussed with the patient by Dr. Darrelyn Hillock.     Dimitri Ped, PA-C

## 2012-09-08 NOTE — Progress Notes (Signed)
Spoke with pt by phone pt aware surgery time changed to 1000 am npo after midnight arrive 730 am [facility] 09-09-2012

## 2012-09-09 ENCOUNTER — Encounter (HOSPITAL_COMMUNITY): Payer: Self-pay | Admitting: *Deleted

## 2012-09-09 ENCOUNTER — Encounter (HOSPITAL_COMMUNITY): Payer: Self-pay | Admitting: Certified Registered Nurse Anesthetist

## 2012-09-09 ENCOUNTER — Observation Stay (HOSPITAL_COMMUNITY)
Admission: RE | Admit: 2012-09-09 | Discharge: 2012-09-10 | Disposition: A | Discharge status: Home / Self Care | Payer: Medicare Other | Source: Ambulatory Visit | Attending: Orthopedic Surgery | Admitting: Orthopedic Surgery

## 2012-09-09 ENCOUNTER — Ambulatory Visit (HOSPITAL_COMMUNITY): Payer: Medicare Other | Admitting: Certified Registered Nurse Anesthetist

## 2012-09-09 ENCOUNTER — Encounter (HOSPITAL_COMMUNITY)
Admission: RE | Disposition: A | Discharge status: Home / Self Care | Payer: Self-pay | Source: Ambulatory Visit | Attending: Orthopedic Surgery

## 2012-09-09 DIAGNOSIS — M216X9 Other acquired deformities of unspecified foot: Secondary | ICD-10-CM | POA: Insufficient documentation

## 2012-09-09 DIAGNOSIS — M25819 Other specified joint disorders, unspecified shoulder: Secondary | ICD-10-CM | POA: Diagnosis not present

## 2012-09-09 DIAGNOSIS — I10 Essential (primary) hypertension: Secondary | ICD-10-CM | POA: Diagnosis not present

## 2012-09-09 DIAGNOSIS — S43499A Other sprain of unspecified shoulder joint, initial encounter: Secondary | ICD-10-CM | POA: Diagnosis not present

## 2012-09-09 DIAGNOSIS — S46819A Strain of other muscles, fascia and tendons at shoulder and upper arm level, unspecified arm, initial encounter: Secondary | ICD-10-CM | POA: Diagnosis not present

## 2012-09-09 DIAGNOSIS — Z9889 Other specified postprocedural states: Secondary | ICD-10-CM

## 2012-09-09 DIAGNOSIS — Z79899 Other long term (current) drug therapy: Secondary | ICD-10-CM | POA: Diagnosis not present

## 2012-09-09 DIAGNOSIS — M75120 Complete rotator cuff tear or rupture of unspecified shoulder, not specified as traumatic: Principal | ICD-10-CM | POA: Insufficient documentation

## 2012-09-09 DIAGNOSIS — N200 Calculus of kidney: Secondary | ICD-10-CM | POA: Diagnosis not present

## 2012-09-09 HISTORY — PX: SHOULDER OPEN ROTATOR CUFF REPAIR: SHX2407

## 2012-09-09 LAB — TYPE AND SCREEN
ABO/RH(D): B POS
Antibody Screen: NEGATIVE

## 2012-09-09 SURGERY — REPAIR, ROTATOR CUFF, OPEN
Anesthesia: General | Site: Shoulder | Laterality: Left | Wound class: Clean

## 2012-09-09 MED ORDER — NEOSTIGMINE METHYLSULFATE 1 MG/ML IJ SOLN
INTRAMUSCULAR | Status: DC | PRN
  Administered 2012-09-09: 3 mg via INTRAVENOUS

## 2012-09-09 MED ORDER — ACETAMINOPHEN 650 MG RE SUPP: 650.0000 mg | Freq: Four times a day (QID) | RECTAL | Status: DC | PRN

## 2012-09-09 MED ORDER — CEFAZOLIN SODIUM 1-5 GM-% IV SOLN
1.0000 g | Freq: Four times a day (QID) | INTRAVENOUS | Status: AC
Start: 2012-09-09 — End: 2012-09-10
  Administered 2012-09-09 – 2012-09-10 (×3): 1 g via INTRAVENOUS
  Filled 2012-09-09 (×3): qty 50

## 2012-09-09 MED ORDER — SILDENAFIL CITRATE 20 MG PO TABS: 20.0000 mg | ORAL_TABLET | Freq: Every day | ORAL | Status: DC | PRN

## 2012-09-09 MED ORDER — THROMBIN 5000 UNITS EX SOLR
OROMUCOSAL | Status: DC | PRN
  Administered 2012-09-09: 11:00:00 via TOPICAL

## 2012-09-09 MED ORDER — METHOCARBAMOL 500 MG PO TABS
500.0000 mg | ORAL_TABLET | Freq: Four times a day (QID) | ORAL | Status: DC | PRN
  Administered 2012-09-10: 500 mg via ORAL
  Filled 2012-09-09 (×2): qty 1

## 2012-09-09 MED ORDER — CEFAZOLIN SODIUM-DEXTROSE 2-3 GM-% IV SOLR
INTRAVENOUS | Status: AC
  Filled 2012-09-09: qty 50

## 2012-09-09 MED ORDER — CEFAZOLIN SODIUM-DEXTROSE 2-3 GM-% IV SOLR
2.0000 g | INTRAVENOUS | Status: AC
  Administered 2012-09-09: 2 g via INTRAVENOUS

## 2012-09-09 MED ORDER — ENALAPRIL MALEATE 20 MG PO TABS
20.0000 mg | ORAL_TABLET | Freq: Two times a day (BID) | ORAL | Status: DC
  Administered 2012-09-09: 20 mg via ORAL
  Filled 2012-09-09 (×3): qty 1

## 2012-09-09 MED ORDER — PROPOFOL 10 MG/ML IV BOLUS
INTRAVENOUS | Status: DC | PRN
  Administered 2012-09-09: 100 mg via INTRAVENOUS
  Administered 2012-09-09: 150 mg via INTRAVENOUS
  Administered 2012-09-09: 50 mg via INTRAVENOUS

## 2012-09-09 MED ORDER — BISACODYL 10 MG RE SUPP: 10.0000 mg | Freq: Every day | RECTAL | Status: DC | PRN

## 2012-09-09 MED ORDER — LACTATED RINGERS IV SOLN
INTRAVENOUS | Status: DC
  Administered 2012-09-09: 1000 mL via INTRAVENOUS
  Administered 2012-09-09 (×2): via INTRAVENOUS

## 2012-09-09 MED ORDER — ONDANSETRON HCL 4 MG PO TABS: 4.0000 mg | ORAL_TABLET | Freq: Four times a day (QID) | ORAL | Status: DC | PRN

## 2012-09-09 MED ORDER — SODIUM CHLORIDE 0.9 % IR SOLN
Status: DC | PRN
  Administered 2012-09-09: 11:00:00

## 2012-09-09 MED ORDER — BUPIVACAINE-EPINEPHRINE PF 0.25-1:200000 % IJ SOLN
INTRAMUSCULAR | Status: AC
  Filled 2012-09-09: qty 30

## 2012-09-09 MED ORDER — LACTATED RINGERS IV SOLN: INTRAVENOUS | Status: DC

## 2012-09-09 MED ORDER — TAMSULOSIN HCL 0.4 MG PO CAPS
0.4000 mg | ORAL_CAPSULE | Freq: Every evening | ORAL | Status: DC
  Filled 2012-09-09 (×2): qty 1

## 2012-09-09 MED ORDER — PROMETHAZINE HCL 25 MG/ML IJ SOLN
6.2500 mg | INTRAMUSCULAR | Status: DC | PRN
  Administered 2012-09-09: 6.25 mg via INTRAVENOUS

## 2012-09-09 MED ORDER — PHENYLEPHRINE HCL 10 MG/ML IJ SOLN
INTRAMUSCULAR | Status: DC | PRN
  Administered 2012-09-09 (×2): 40 ug via INTRAVENOUS

## 2012-09-09 MED ORDER — FENTANYL CITRATE 0.05 MG/ML IJ SOLN
INTRAMUSCULAR | Status: AC
  Filled 2012-09-09: qty 2

## 2012-09-09 MED ORDER — PHENOL 1.4 % MT LIQD: 1.0000 | OROMUCOSAL | Status: DC | PRN

## 2012-09-09 MED ORDER — METHOCARBAMOL 100 MG/ML IJ SOLN
500.0000 mg | Freq: Four times a day (QID) | INTRAVENOUS | Status: DC | PRN
  Administered 2012-09-10: 500 mg via INTRAVENOUS
  Filled 2012-09-09: qty 5

## 2012-09-09 MED ORDER — THROMBIN 5000 UNITS EX SOLR
CUTANEOUS | Status: AC
  Filled 2012-09-09: qty 5000

## 2012-09-09 MED ORDER — MIDAZOLAM HCL 5 MG/5ML IJ SOLN
INTRAMUSCULAR | Status: DC | PRN
  Administered 2012-09-09: 2 mg via INTRAVENOUS

## 2012-09-09 MED ORDER — LIDOCAINE HCL 1 % IJ SOLN
INTRAMUSCULAR | Status: DC | PRN
  Administered 2012-09-09: 100 mg via INTRADERMAL

## 2012-09-09 MED ORDER — PROMETHAZINE HCL 25 MG/ML IJ SOLN
INTRAMUSCULAR | Status: AC
  Filled 2012-09-09: qty 1

## 2012-09-09 MED ORDER — POLYETHYLENE GLYCOL 3350 17 G PO PACK
17.0000 g | PACK | Freq: Every day | ORAL | Status: DC | PRN
  Filled 2012-09-09: qty 1

## 2012-09-09 MED ORDER — FENTANYL CITRATE 0.05 MG/ML IJ SOLN
25.0000 ug | INTRAMUSCULAR | Status: DC | PRN
  Administered 2012-09-09: 25 ug via INTRAVENOUS
  Administered 2012-09-09: 50 ug via INTRAVENOUS

## 2012-09-09 MED ORDER — OXYCODONE-ACETAMINOPHEN 5-325 MG PO TABS
1.0000 | ORAL_TABLET | ORAL | Status: DC | PRN
  Administered 2012-09-09: 2 via ORAL
  Filled 2012-09-09: qty 2

## 2012-09-09 MED ORDER — GLYCOPYRROLATE 0.2 MG/ML IJ SOLN
INTRAMUSCULAR | Status: DC | PRN
  Administered 2012-09-09: .4 mg via INTRAVENOUS

## 2012-09-09 MED ORDER — BUPIVACAINE LIPOSOME 1.3 % IJ SUSP
INTRAMUSCULAR | Status: DC | PRN
  Administered 2012-09-09: 20 mL

## 2012-09-09 MED ORDER — ACETAMINOPHEN 325 MG PO TABS: 650.0000 mg | ORAL_TABLET | Freq: Four times a day (QID) | ORAL | Status: DC | PRN

## 2012-09-09 MED ORDER — CISATRACURIUM BESYLATE (PF) 10 MG/5ML IV SOLN
INTRAVENOUS | Status: DC | PRN
  Administered 2012-09-09: 2 mg via INTRAVENOUS
  Administered 2012-09-09: 6 mg via INTRAVENOUS
  Administered 2012-09-09: 4 mg via INTRAVENOUS

## 2012-09-09 MED ORDER — SUCCINYLCHOLINE CHLORIDE 20 MG/ML IJ SOLN
INTRAMUSCULAR | Status: DC | PRN
  Administered 2012-09-09: 100 mg via INTRAVENOUS

## 2012-09-09 MED ORDER — ONDANSETRON HCL 4 MG/2ML IJ SOLN: 4.0000 mg | Freq: Four times a day (QID) | INTRAMUSCULAR | Status: DC | PRN

## 2012-09-09 MED ORDER — HYDROCODONE-ACETAMINOPHEN 5-325 MG PO TABS
1.0000 | ORAL_TABLET | ORAL | Status: DC | PRN
  Administered 2012-09-09 – 2012-09-10 (×3): 2 via ORAL
  Filled 2012-09-09 (×3): qty 2

## 2012-09-09 MED ORDER — BUPIVACAINE LIPOSOME 1.3 % IJ SUSP
20.0000 mL | Freq: Once | INTRAMUSCULAR | Status: DC
  Filled 2012-09-09: qty 20

## 2012-09-09 MED ORDER — MEPERIDINE HCL 50 MG/ML IJ SOLN: 6.2500 mg | INTRAMUSCULAR | Status: DC | PRN

## 2012-09-09 MED ORDER — MENTHOL 3 MG MT LOZG: 1.0000 | LOZENGE | OROMUCOSAL | Status: DC | PRN

## 2012-09-09 MED ORDER — FENTANYL CITRATE 0.05 MG/ML IJ SOLN
INTRAMUSCULAR | Status: DC | PRN
  Administered 2012-09-09: 50 ug via INTRAVENOUS
  Administered 2012-09-09: 100 ug via INTRAVENOUS
  Administered 2012-09-09: 50 ug via INTRAVENOUS

## 2012-09-09 MED ORDER — HYDROMORPHONE HCL PF 1 MG/ML IJ SOLN
INTRAMUSCULAR | Status: DC | PRN
  Administered 2012-09-09 (×2): 1 mg via INTRAVENOUS

## 2012-09-09 MED ORDER — HYDROMORPHONE HCL PF 1 MG/ML IJ SOLN
0.5000 mg | INTRAMUSCULAR | Status: DC | PRN
  Administered 2012-09-09 (×2): 1 mg via INTRAVENOUS
  Filled 2012-09-09 (×3): qty 1

## 2012-09-09 SURGICAL SUPPLY — 39 items
ADH SKN CLS APL DERMABOND .7 (GAUZE/BANDAGES/DRESSINGS) ×1
ANCH SUT 2 5.5 BABSR ASCP (Orthopedic Implant) ×3 IMPLANT
ANCHOR PEEK ZIP 5.5 NDL NO2 (Orthopedic Implant) ×3 IMPLANT
BLADE OSCILLATING/SAGITTAL (BLADE) ×2
BLADE SW THK.38XMED LNG THN (BLADE) ×1 IMPLANT
BNDG COHESIVE 6X5 TAN NS LF (GAUZE/BANDAGES/DRESSINGS) ×1 IMPLANT
BUR OVAL CARBIDE 4.0 (BURR) ×2 IMPLANT
CLEANER TIP ELECTROSURG 2X2 (MISCELLANEOUS) ×2 IMPLANT
CLOTH BEACON ORANGE TIMEOUT ST (SAFETY) ×2 IMPLANT
DERMABOND ADVANCED (GAUZE/BANDAGES/DRESSINGS) ×1
DERMABOND ADVANCED .7 DNX12 (GAUZE/BANDAGES/DRESSINGS) ×1 IMPLANT
DRAPE POUCH INSTRU U-SHP 10X18 (DRAPES) ×2 IMPLANT
DRSG AQUACEL AG ADV 3.5X 6 (GAUZE/BANDAGES/DRESSINGS) ×2 IMPLANT
DURAPREP 26ML APPLICATOR (WOUND CARE) ×2 IMPLANT
ELECT REM PT RETURN 9FT ADLT (ELECTROSURGICAL) ×2
ELECTRODE REM PT RTRN 9FT ADLT (ELECTROSURGICAL) ×1 IMPLANT
GLOVE BIOGEL PI IND STRL 8 (GLOVE) ×1 IMPLANT
GLOVE BIOGEL PI INDICATOR 8 (GLOVE) ×1
GLOVE SURG SS PI 6.5 STRL IVOR (GLOVE) ×4 IMPLANT
GLOVE SURG SS PI 8.0 STRL IVOR (GLOVE) ×1 IMPLANT
GOWN PREVENTION PLUS LG XLONG (DISPOSABLE) ×5 IMPLANT
GOWN STRL REIN XL XLG (GOWN DISPOSABLE) ×1 IMPLANT
KIT BASIN OR (CUSTOM PROCEDURE TRAY) ×2 IMPLANT
MANIFOLD NEPTUNE II (INSTRUMENTS) ×2 IMPLANT
NS IRRIG 1000ML POUR BTL (IV SOLUTION) ×1 IMPLANT
PACK SHOULDER CUSTOM OPM052 (CUSTOM PROCEDURE TRAY) ×2 IMPLANT
PATCH TISSUE MEND 3X3CM (Orthopedic Implant) ×1 IMPLANT
POSITIONER SURGICAL ARM (MISCELLANEOUS) ×2 IMPLANT
SLING ARM IMMOBILIZER LRG (SOFTGOODS) ×2 IMPLANT
SPONGE SURGIFOAM ABS GEL 100 (HEMOSTASIS) ×1 IMPLANT
SUCTION FRAZIER 12FR DISP (SUCTIONS) ×2 IMPLANT
SUT BONE WAX W31G (SUTURE) ×2 IMPLANT
SUT ETHIBOND NAB CT1 #1 30IN (SUTURE) ×3 IMPLANT
SUT MNCRL AB 4-0 PS2 18 (SUTURE) ×2 IMPLANT
SUT VIC AB 1 CT1 27 (SUTURE) ×4
SUT VIC AB 1 CT1 27XBRD ANTBC (SUTURE) ×2 IMPLANT
SUT VIC AB 2-0 CT1 27 (SUTURE) ×4
SUT VIC AB 2-0 CT1 27XBRD (SUTURE) IMPLANT
TOWEL OR 17X26 10 PK STRL BLUE (TOWEL DISPOSABLE) ×4 IMPLANT

## 2012-09-09 NOTE — Brief Op Note (Signed)
09/09/2012  11:35 AM  PATIENT:  James Dyer  67 y.o. male  PRE-OPERATIVE DIAGNOSIS:  LEFT SHOULDER ROTATOR CUFF TEAR  POST-OPERATIVE DIAGNOSIS:  LEFT SHOULDER ROTATOR CUFF TEAR,COMPLETE,RETRACTED,COMPLEX.  PROCEDURE:  Procedure(s): LEFT ROTATOR CUFF REPAIR SHOULDER OPEN (Left) and Three Anchors and Tissue Mend Graft used for Repair.Very Complex Repair.  SURGEON:  Surgeon(s) and Role:    * Jacki Cones, MD - Primary  PHYSICIAN ASSISTANT: Dimitri Ped PA  ASSISTANTS: Dimitri Ped PA  ANESTHESIA:   general  EBL:  Total I/O In: 1000 [I.V.:1000] Out: -   BLOOD ADMINISTERED:none  DRAINS: none   LOCAL MEDICATIONS USED:  BUPIVICAINE 20cc.  SPECIMEN:  No Specimen  DISPOSITION OF SPECIMEN:  N/A  COUNTS:  YES  TOURNIQUET:  * No tourniquets in log *  DICTATION: .Other Dictation: Dictation Number [id number]  PLAN OF CARE: Admit for overnight observation  PATIENT DISPOSITION:  Stable in OR   Delay start of Pharmacological VTE agent (>24hrs) due to surgical blood loss or risk of bleeding: yes

## 2012-09-09 NOTE — Anesthesia Preprocedure Evaluation (Addendum)
Anesthesia Evaluation  Patient identified by MRN, date of birth, ID band Patient awake    Reviewed: Allergy & Precautions, H&P , NPO status , Patient's Chart, lab work & pertinent test results  Airway Mallampati: II TM Distance: >3 FB Neck ROM: Full    Dental no notable dental hx.    Pulmonary neg pulmonary ROS, former smoker,  breath sounds clear to auscultation  Pulmonary exam normal       Cardiovascular hypertension, Pt. on medications negative cardio ROS  Rhythm:Regular Rate:Normal     Neuro/Psych negative neurological ROS  negative psych ROS   GI/Hepatic negative GI ROS, Neg liver ROS,   Endo/Other  negative endocrine ROS  Renal/GU negative Renal ROS  negative genitourinary   Musculoskeletal negative musculoskeletal ROS (+)   Abdominal   Peds negative pediatric ROS (+)  Hematology negative hematology ROS (+)   Anesthesia Other Findings   Reproductive/Obstetrics negative OB ROS                           Anesthesia Physical Anesthesia Plan  ASA: II  Anesthesia Plan: General   Post-op Pain Management:    Induction: Intravenous  Airway Management Planned: Oral ETT  Additional Equipment:   Intra-op Plan:   Post-operative Plan: Extubation in OR  Informed Consent: I have reviewed the patients History and Physical, chart, labs and discussed the procedure including the risks, benefits and alternatives for the proposed anesthesia with the patient or authorized representative who has indicated his/her understanding and acceptance.   Dental advisory given  Plan Discussed with: CRNA  Anesthesia Plan Comments:         Anesthesia Quick Evaluation  

## 2012-09-09 NOTE — Anesthesia Procedure Notes (Signed)
Procedure Name: Intubation Date/Time: 09/09/2012 10:10 AM Performed by: Hulan Fess Pre-anesthesia Checklist: Patient identified, Emergency Drugs available, Suction available, Patient being monitored and Timeout performed Patient Re-evaluated:Patient Re-evaluated prior to inductionOxygen Delivery Method: Circle system utilized Preoxygenation: Pre-oxygenation with 100% oxygen Intubation Type: IV induction Ventilation: Mask ventilation without difficulty Laryngoscope Size: Mac and 3 Grade View: Grade II Tube type: Oral Tube size: 8.0 mm Number of attempts: 1 Placement Confirmation: positive ETCO2 and breath sounds checked- equal and bilateral Secured at: 22 cm Tube secured with: Tape Dental Injury: Teeth and Oropharynx as per pre-operative assessment

## 2012-09-09 NOTE — Anesthesia Postprocedure Evaluation (Signed)
  Anesthesia Post-op Note  Patient: James Dyer  Procedure(s) Performed: Procedure(s) (LRB): LEFT ROTATOR CUFF REPAIR SHOULDER OPEN (Left)  Patient Location: PACU  Anesthesia Type: General  Level of Consciousness: awake and alert   Airway and Oxygen Therapy: Patient Spontanous Breathing  Post-op Pain: mild  Post-op Assessment: Post-op Vital signs reviewed, Patient's Cardiovascular Status Stable, Respiratory Function Stable, Patent Airway and No signs of Nausea or vomiting  Last Vitals:  Filed Vitals:   09/09/12 1640  BP: 154/98  Pulse: 85  Temp: 36.5 C  Resp: 16    Post-op Vital Signs: stable   Complications: No apparent anesthesia complications

## 2012-09-09 NOTE — Interval H&P Note (Signed)
History and Physical Interval Note:  09/09/2012 9:47 AM  James Dyer  has presented today for surgery, with the diagnosis of LEFT SHOULDER ROTATOR CUFF TEAR  The various methods of treatment have been discussed with the patient and family. After consideration of risks, benefits and other options for treatment, the patient has consented to  Procedure(s): LEFT ROTATOR CUFF REPAIR SHOULDER OPEN (Left) as a surgical intervention .  The patient's history has been reviewed, patient examined, no change in status, stable for surgery.  I have reviewed the patient's chart and labs.  Questions were answered to the patient's satisfaction.     Harla Mensch A

## 2012-09-09 NOTE — Transfer of Care (Signed)
Immediate Anesthesia Transfer of Care Note  Patient: James Dyer  Procedure(s) Performed: Procedure(s): LEFT ROTATOR CUFF REPAIR SHOULDER OPEN (Left)  Patient Location: PACU   Anesthesia Type:General  Level of Consciousness: awake  Airway & Oxygen Therapy: Patient Spontanous Breathing and Patient connected to face mask oxygen  Post-op Assessment: Report given to PACU RN  Post vital signs: Reviewed and stable  Complications: No apparent anesthesia complications

## 2012-09-10 ENCOUNTER — Encounter (HOSPITAL_COMMUNITY): Payer: Self-pay | Admitting: Orthopedic Surgery

## 2012-09-10 MED ORDER — OXYCODONE HCL 5 MG PO TABS: 5.0000 mg | ORAL_TABLET | ORAL | Status: DC | PRN

## 2012-09-10 MED ORDER — METHOCARBAMOL 500 MG PO TABS: 500.0000 mg | ORAL_TABLET | Freq: Four times a day (QID) | ORAL | Status: DC | PRN

## 2012-09-10 NOTE — Progress Notes (Signed)
   Subjective: 1 Day Post-Op Procedure(s) (LRB): LEFT ROTATOR CUFF REPAIR SHOULDER OPEN (Left) Patient reports pain as mild.   Patient seen in rounds without Dr. Darrelyn Hillock. Patient is well, and has had no acute complaints or problems except for some pain in the left shoulder as expected. He reports that his pain is under much better control this morning than it was yesterday evening. No SOB or chest pain.  Plan is to go Home after hospital stay.  Objective: Vital signs in last 24 hours: Temp:  [97.6 F (36.4 C)-98.3 F (36.8 C)] 98.3 F (36.8 C) (08/13 0543) Pulse Rate:  [64-92] 82 (08/13 0543) Resp:  [12-18] 18 (08/13 0543) BP: (132-174)/(75-101) 151/91 mmHg (08/13 0543) SpO2:  [95 %-100 %] 96 % (08/13 0543) Weight:  [116.03 kg (255 lb 12.8 oz)] 116.03 kg (255 lb 12.8 oz) (08/12 1326)  Intake/Output from previous day:  Intake/Output Summary (Last 24 hours) at 09/10/12 0733 Last data filed at 09/10/12 0600  Gross per 24 hour  Intake 5013.33 ml  Output   1800 ml  Net 3213.33 ml     EXAM General - Patient is Alert and Oriented Extremity - Neurologically intact Neurovascular intact Dressing/Incision - clean, dry, no drainage Motor Function - intact, moving hand and fingers well on exam.   Past Medical History  Diagnosis Date  . Hypertension   . Hypercholesterolemia   . 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 Borderline diabetes   . Cancer 2013    prostate   . Hepatitis     "pt told had been exposed to hepatitis when he went to donate blood"  . Nephrolithiasis   . 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  Weight as of this encounter: 116.03 kg (255 lb 12.8 oz). Advance diet D/C IV fluids Discharge home  Wear sling at all times except showering  He is doing much better this morning. Will plan on discharge today after he works with OT. Discharge instructions given. Will follow up in the office in 10 days.      Diamonte Stavely LAUREN 09/10/2012, 7:33 AM

## 2012-09-10 NOTE — Op Note (Signed)
NAME:  James Dyer, James Dyer NO.:  [id number]  MEDICAL RECORD NO.:  [id number]  LOCATION:  1530                         FACILITY:  [facility]  PHYSICIAN:  Georges Lynch. Gustabo Gordillo, M.D.DATE OF BIRTH:  [birth year]  DATE OF PROCEDURE:  09/09/2012 DATE OF DISCHARGE:                              OPERATIVE REPORT   SURGEON:  Georges Lynch. Shalika Arntz, MD.  ASSISTANT:  Dimitri Ped, PA-C  PREOPERATIVE DIAGNOSES: 1. Complete retracted complex tear of the left rotator cuff. 2. Severe impingement syndrome, left shoulder.  POSTOPERATIVE DIAGNOSES: 1. Complete retracted complex tear of the left rotator cuff. 2. Severe impingement syndrome, left shoulder.  OPERATION: 1. Open acromionectomy, left shoulder. 2. Repair of a complex retracted tear of the left rotator cuff     utilizing 3 anchors and a TissueMend graft.  PROCEDURE:  Under general anesthesia with the patient in a beach chair position, he first had 3 g of IV Ancef.  At this time, the appropriate time-out was carried out prior to surgery.  I also marked the appropriate left shoulder in the holding area.  After prep and draping, I made the incision over the anterior aspect of the left shoulder. Bleeders were identified and cauterized.  I then stripped the deltoid tendon from the acromion in the usual fashion.  I then made an incision down the proximal part of the deltoid muscle.  Following that, I did a subdeltoid bursectomy.  I identified the retracted cuff tear.  At this time, I protected the underlying cuff with a Bennett retractor and I utilized the oscillating saw.  I did a partial acromionectomy and then I utilized the bur to do acromioplasty.  I thoroughly irrigated out the area, and I bone waxed the undersurface of the acromion.  At this time, I burred the lateral articular surface of the humeral head.  I then inserted 3 anchors in the proximal humerus.  Each anchor had 4 sutures. I utilized a couple of clamps on the cuff,  brought the cuff forward.  I first did a side-to-side repair of the proximal part with #1 Ethibond suture, then I utilized the anchor sutures to anchor the tendon down in place in the usual fashion.  Following that, we then utilized a overlay graft which was a TissueMend graft the suture into the remaining defect. The wound was thoroughly irrigated with antibiotic solution.  I inserted some thrombin-soaked Gelfoam, reapproximated the deltoid tendon and the muscle in usual fashion.  We injected 20 mL of Exparel into the soft tissue and the remaining part of the wound was closed in usual fashion with a Monocryl subcuticular suture.  Sterile dressings were applied and the patient was placed in a shoulder immobilizer.          ______________________________ Georges Lynch Darrelyn Hillock, M.D.     RAG/MEDQ  D:  09/09/2012  T:  09/10/2012  Job:  [id number]

## 2012-09-10 NOTE — Evaluation (Signed)
Occupational Therapy Evaluation Patient Details Name: KOSEI RHODES MRN: [id number] DOB: 29-Mar-1945 Today's Date: 09/10/2012 Time: 4098-1191 OT Time Calculation (min): 41 min  OT Assessment / Plan / Recommendation History of present illness  Pt is a 67 y/o left rotator cuff repair.   Clinical Impression   Pt was educated and performed ADL's as well as sling use for LU s/p L rotator cuff repair. He plans to d/c home with his wife today w/ PRN assist. He will f/u with his MD for progression of shoulder protocol.    OT Assessment  Patient does not need any further OT services;Progress rehab of shoulder as ordered by MD at follow-up appointment    Follow Up Recommendations  No OT follow up    Barriers to Discharge      Equipment Recommendations  None recommended by OT    Recommendations for Other Services    Frequency       Precautions / Restrictions Precautions Precautions: Shoulder;Other (comment) Type of Shoulder Precautions: Sling at all times except to shower Shoulder Interventions: Shoulder sling/immobilizer;At all times Required Braces or Orthoses: Sling Restrictions Weight Bearing Restrictions: No Other Position/Activity Restrictions: Sling at all times. NO range of motion   Pertinent Vitals/Pain 5/10 L shoulder, RN aware and gave meds prior to treatment session    ADL  Eating/Feeding: Performed;Set up Where Assessed - Eating/Feeding: Edge of bed Grooming: Performed;Wash/dry hands;Wash/dry face;Teeth care;Set up Where Assessed - Grooming: Unsupported sitting;Unsupported standing Upper Body Bathing: Performed;Chest;Right arm;Left arm;Abdomen;Minimal assistance Where Assessed - Upper Body Bathing: Unsupported sitting Lower Body Bathing: Performed;Min guard Where Assessed - Lower Body Bathing: Unsupported sitting;Unsupported sit to stand Upper Body Dressing: Performed;Minimal assistance Where Assessed - Upper Body Dressing: Unsupported sitting Lower Body Dressing:  Performed;Min guard Where Assessed - Lower Body Dressing: Unsupported sit to stand;Unsupported sitting Toilet Transfer: Performed;Modified independent Toilet Transfer Method: Sit to Barista: Comfort height toilet Toileting - Clothing Manipulation and Hygiene: Performed;Min guard Where Assessed - Engineer, mining and Hygiene: Standing Tub/Shower Transfer Method: Not assessed Equipment Used: Other (comment) (LUE Sling) Transfers/Ambulation Related to ADLs: Pt is overall S-Mod I level transfers and functional mobility (wears LE braces in shoes for bilateral foot drop). ADL Comments: Pt plans to d/c home w/ wife assist PRN today. Pt was educated in sling use, care & precautions as well as L UE sling use at all times except to shower, No range of motion.    OT Diagnosis:    OT Problem List:   OT Treatment Interventions:     OT Goals(Current goals can be found in the care plan section) Acute Rehab OT Goals Patient Stated Goal: Home w/ spouse  Visit Information   09/10/12       Prior Functioning     Home Living Family/patient expects to be discharged to:: Private residence Living Arrangements: Spouse/significant other Available Help at Discharge: Family;Available PRN/intermittently Type of Home: House Home Access: Stairs to enter [employer] of Steps: 4 STE  or 2 STE from side entrance Home Layout: One level Home Equipment: Shower seat - built in;Grab bars - tub/shower;Hand held shower head Prior Function Level of Independence: Independent Communication Communication: No difficulties Dominant Hand: Right    Vision/Perception Vision - History Baseline Vision: Wears glasses all the time Patient Visual Report: No change from baseline   Cognition  Cognition Arousal/Alertness: Awake/alert Behavior During Therapy: WFL for tasks assessed/performed Overall Cognitive Status: Within Functional Limits for tasks assessed     Extremity/Trunk Assessment Upper Extremity  Assessment Upper Extremity Assessment: LUE deficits/detail LUE: Unable to fully assess due to immobilization Lower Extremity Assessment Lower Extremity Assessment: Overall WFL for tasks assessed Cervical / Trunk Assessment Cervical / Trunk Assessment: Normal    Mobility Bed Mobility Bed Mobility: Sit to Supine;Sitting - Scoot to Edge of Bed Sitting - Scoot to Edge of Bed: 6: Modified independent (Device/Increase time) Sit to Supine: 6: Modified independent (Device/Increase time);HOB elevated Transfers Transfers: Sit to Stand;Stand to Sit Sit to Stand: 7: Independent;From bed;From toilet Stand to Sit: 7: Independent;To bed;To toilet    Exercise Donning/doffing shirt without moving shoulder: Minimal assistance (Pt able to I state, written, verbal instruction & performanc) Method for sponge bathing under operated UE: Set-up Donning/doffing sling/immobilizer: Minimal assistance Correct positioning of sling/immobilizer: Independent Sling wearing schedule (on at all times/off for ADL's): Independent Proper positioning of operated UE when showering: Supervision/safety Positioning of UE while sleeping: Modified independent;Supervision/safety   Balance Balance Balance Assessed: Yes Static Sitting Balance Static Sitting - Balance Support: No upper extremity supported;Feet supported Static Standing Balance Static Standing - Balance Support: No upper extremity supported;During functional activity   End of Session OT - End of Session Equipment Utilized During Treatment: Other (comment) (LUE sling at all times except to shower) Activity Tolerance: Patient tolerated treatment well Patient left: in bed;with call bell/phone within reach Nurse Communication: Mobility status;Other (comment) (Pt states ready for d/c)  GO Functional Assessment Tool Used: Clinical judgement Functional Limitation: Self care Self Care Current Status (W0981): At least 1  percent but less than 20 percent impaired, limited or restricted Self Care Goal Status ([id number]): At least 1 percent but less than 20 percent impaired, limited or restricted Self Care Discharge Status [phone]): At least 1 percent but less than 20 percent impaired, limited or restricted   Alm Bustard 09/10/2012, 8:59 AM

## 2012-09-16 NOTE — Discharge Summary (Signed)
Physician Discharge Summary   Patient ID: James Dyer MRN: [id number] DOB/AGE: 09/25/1945 67 y.o.  Admit date: 09/09/2012 Discharge date: 09/10/2012  Primary Diagnosis: Complete tear of left rotator cuff  Admission Diagnoses:  Past Medical History  Diagnosis Date  . Hypertension   . Hypercholesterolemia   . 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 Borderline diabetes   . Cancer 2013    prostate   . Hepatitis     "pt told had been exposed to hepatitis when he went to donate blood"  . Nephrolithiasis   . Foot drop, bilateral since birthpt wears braces in shoes    pt must have braces to walk   Discharge Diagnoses:   Active Problems:   Complete tear of rotator cuff  Estimated body mass index is 37.76 kg/(m^2) as calculated from the following:   Height as of this encounter: 5\' 9"  (1.753 m).   Weight as of this encounter: 116.03 kg (255 lb 12.8 oz).  Procedure:  Procedure(s) (LRB): LEFT ROTATOR CUFF REPAIR SHOULDER OPEN (Left)   Consults: None  HPI: The patient is a 67 year old male who presents with shoulder complaints. They are right handed and present today reporting pain at the left shoulder that began 2 weeks ago. The patient reports that the shoulder symptoms began following a specific injury. The injury resulted from a fall onto the shoulder while running from bees. The patient reports symptoms which include shoulder pain, decreased range of motion and inability to lay on that side. The patient reports symptoms that radiate to the left upper arm. The patient describes these symptoms as moderate in severity. Symptoms are exacerbated by motion at the shoulder and lifting. The patient was previously evaluated in the emergency room 2 day(s) ago. Presentation at that time included shoulder injury and shoulder pain. Past evaluation has included shoulder x-rays. Past treatment has included opioid analgesics. MRI shows a complete tear of the  left rotator cuff with retraction.     Laboratory Data: Admission on 09/09/2012, Discharged on 09/10/2012  Component Date Value Range Status  . ABO/RH(D) 09/09/2012 B POS   Final  . Antibody Screen 09/09/2012 NEG   Final  . Sample Expiration 09/09/2012 09/12/2012   Final  . ABO/RH(D) 09/09/2012 B POS   Final  Hospital Outpatient Visit on 09/05/2012  Component Date Value Range Status  . aPTT 09/05/2012 29  24 - 37 seconds Final  . Sodium 09/05/2012 136  135 - 145 mEq/L Final  . Potassium 09/05/2012 3.5  3.5 - 5.1 mEq/L Final  . Chloride 09/05/2012 100  96 - 112 mEq/L Final  . CO2 09/05/2012 27  19 - 32 mEq/L Final  . Glucose, Bld 09/05/2012 126* 70 - 99 mg/dL Final  . BUN 40/98/1191 20  6 - 23 mg/dL Final  . Creatinine, Ser 09/05/2012 0.89  0.50 - 1.35 mg/dL Final  . Calcium 47/82/9562 9.5  8.4 - 10.5 mg/dL Final  . Total Protein 09/05/2012 6.9  6.0 - 8.3 g/dL Final  . Albumin 13/08/6576 3.6  3.5 - 5.2 g/dL Final  . AST 46/96/2952 19  0 - 37 U/L Final  . ALT 09/05/2012 20  0 - 53 U/L Final  . Alkaline Phosphatase 09/05/2012 55  39 - 117 U/L Final  . Total Bilirubin 09/05/2012 0.3  0.3 - 1.2 mg/dL Final  . GFR calc non Af Amer 09/05/2012 87* >90 mL/min Final  . GFR calc Af Amer 09/05/2012 >90  >  90 mL/min Final   Comment:                                 The eGFR has been calculated                          using the CKD EPI equation.                          This calculation has not been                          validated in all clinical                          situations.                          eGFR's persistently                          <90 mL/min signify                          possible Chronic Kidney Disease.  Marland Kitchen Prothrombin Time 09/05/2012 12.7  11.6 - 15.2 seconds Final  . INR 09/05/2012 0.97  0.00 - 1.49 Final  . Color, Urine 09/05/2012 YELLOW  YELLOW Final  . APPearance 09/05/2012 CLEAR  CLEAR Final  . Specific Gravity, Urine 09/05/2012 1.024  1.005 - 1.030 Final    . pH 09/05/2012 7.0  5.0 - 8.0 Final  . Glucose, UA 09/05/2012 NEGATIVE  NEGATIVE mg/dL Final  . Hgb urine dipstick 09/05/2012 NEGATIVE  NEGATIVE Final  . Bilirubin Urine 09/05/2012 NEGATIVE  NEGATIVE Final  . Ketones, ur 09/05/2012 NEGATIVE  NEGATIVE mg/dL Final  . Protein, ur 08/65/7846 NEGATIVE  NEGATIVE mg/dL Final  . Urobilinogen, UA 09/05/2012 0.2  0.0 - 1.0 mg/dL Final  . Nitrite 96/29/5284 NEGATIVE  NEGATIVE Final  . Leukocytes, UA 09/05/2012 NEGATIVE  NEGATIVE Final   MICROSCOPIC NOT DONE ON URINES WITH NEGATIVE PROTEIN, BLOOD, LEUKOCYTES, NITRITE, OR GLUCOSE <1000 mg/dL.  . WBC 09/05/2012 6.8  4.0 - 10.5 K/uL Final  . RBC 09/05/2012 4.51  4.22 - 5.81 MIL/uL Final  . Hemoglobin 09/05/2012 13.7  13.0 - 17.0 g/dL Final  . HCT 13/24/4010 39.1  39.0 - 52.0 % Final  . MCV 09/05/2012 86.7  78.0 - 100.0 fL Final  . MCH 09/05/2012 30.4  26.0 - 34.0 pg Final  . MCHC 09/05/2012 35.0  30.0 - 36.0 g/dL Final  . RDW 27/25/3664 12.5  11.5 - 15.5 % Final  . Platelets 09/05/2012 220  150 - 400 K/uL Final     X-Rays:Dg Shoulder Left  08/26/2012   *RADIOLOGY REPORT*  Clinical Data: History of injury from fall.  History of pain.  LEFT SHOULDER - 2+ VIEW  Comparison: None.  Findings: Alignment is normal.  Joint spaces are preserved.  No fracture or dislocation is evident.  No soft tissue lesions are seen.  There is degenerative roughening of the greater tuberosity of the humeral head.  IMPRESSION: No fracture or dislocation is seen.  Degenerative roughening of the greater tuberosity humeral head.  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   Original Report Authenticated  ByOnalee Hua Call    EKG: Orders placed during the hospital encounter of 09/05/12  . EKG 12-LEAD  . EKG 12-LEAD     Hospital Course: James Dyer is a 67 y.o. who was admitted to [facility]. They were brought to the operating room on 09/09/2012 and underwent Procedure(s): LEFT ROTATOR CUFF REPAIR SHOULDER  OPEN.  Patient tolerated the procedure well and was later transferred to the recovery room and then to the orthopaedic floor for postoperative care.  They were given PO and IV analgesics for pain control following their surgery.  They were given 24 hours of postoperative antibiotics of  Anti-infectives   Start     Dose/Rate Route Frequency Ordered Stop   09/09/12 1600  ceFAZolin (ANCEF) IVPB 1 g/50 mL premix     1 g 100 mL/hr over 30 Minutes Intravenous Every 6 hours 09/09/12 1346 09/10/12 0408   09/09/12 1043  polymyxin B 500,000 Units, bacitracin 50,000 Units in sodium chloride irrigation 0.9 % 500 mL irrigation  Status:  Discontinued       As needed 09/09/12 1043 09/09/12 1202   09/09/12 0732  ceFAZolin (ANCEF) IVPB 2 g/50 mL premix     2 g 100 mL/hr over 30 Minutes Intravenous On call to O.R. 09/09/12 0732 09/09/12 1002    OT was ordered for sling instructions and discussion of ADLs.  Discharge planning consulted to help with postop disposition and equipment needs.  Patient had a decent night on the evening of surgery.   Incision was healing well.  Patient was seen in rounds and was ready to go home.   Discharge Medications: Prior to Admission medications   Medication Sig Start Date End Date Taking? Authorizing Provider  enalapril (VASOTEC) 20 MG tablet Take 20 mg by mouth 2 (two) times daily.   Yes Historical Provider, MD  sodium chloride (OCEAN) 0.65 % nasal spray Place 1 spray into the nose 2 (two) times daily as needed for congestion.   Yes Historical Provider, MD  tamsulosin (FLOMAX) 0.4 MG CAPS Take 0.4 mg by mouth every evening.    Yes Historical Provider, MD  methocarbamol (ROBAXIN) 500 MG tablet Take 1 tablet (500 mg total) by mouth every 6 (six) hours as needed. 09/10/12   Syerra Abdelrahman Tamala Ser, PA-C  naproxen sodium (ALEVE) 220 MG tablet Take 220 mg by mouth 2 (two) times daily as needed (pain).     Historical Provider, MD  oxyCODONE (ROXICODONE) 5 MG immediate release tablet  Take 1-2 tablets (5-10 mg total) by mouth every 3 (three) hours as needed for pain. 09/10/12   Keyshawna Prouse Tamala Ser, PA-C  sildenafil (REVATIO) 20 MG tablet Take 20 mg by mouth daily as needed. erectile dysfunction    Historical Provider, MD    Diet: Regular diet Activity:Wear sling at all times Follow-up:in 10 days Disposition - Home Discharged Condition: good   Discharge Orders   Future Orders Complete By Expires   Call MD / Call 911  As directed    Comments:     If you experience chest pain or shortness of breath, CALL 911 and be transported to the hospital emergency room.  If you develope a fever above 101 F, pus (white drainage) or increased drainage or redness at the wound, or calf pain, call your surgeon's office.   Constipation Prevention  As directed    Comments:     Drink plenty of fluids.  Prune juice may be helpful.  You may use a stool softener, such  as Colace (over the counter) 100 mg twice a day.  Use MiraLax (over the counter) for constipation as needed.   Diet - low sodium heart healthy  As directed    Discharge instructions  As directed    Comments:     Keep your sling on at all times, including sleeping in your sling. The only time you should remove your sling is to shower only but you need to keep your hand against your chest while you shower.  Do not change the dressing unless there is excess drainage Call Dr. Darrelyn Hillock if any wound complications or temperature of 101 degrees F or over.  Call the office for an appointment to see Dr. Darrelyn Hillock in two weeks: [phone] and ask for Dr. Jeannetta Ellis nurse, Mackey Birch[place].   Driving restrictions  As directed    Comments:     No driving   Increase activity slowly as tolerated  As directed    Lifting restrictions  As directed    Comments:     No lifting       Medication List    STOP taking these medications       HYDROcodone-acetaminophen 5-325 MG per tablet  Commonly known as:  NORCO/VICODIN      TAKE these  medications       ALEVE 220 MG tablet  Generic drug:  naproxen sodium  Take 220 mg by mouth 2 (two) times daily as needed (pain).     enalapril 20 MG tablet  Commonly known as:  VASOTEC  Take 20 mg by mouth 2 (two) times daily.     methocarbamol 500 MG tablet  Commonly known as:  ROBAXIN  Take 1 tablet (500 mg total) by mouth every 6 (six) hours as needed.     oxyCODONE 5 MG immediate release tablet  Commonly known as:  ROXICODONE  Take 1-2 tablets (5-10 mg total) by mouth every 3 (three) hours as needed for pain.     sildenafil 20 MG tablet  Commonly known as:  REVATIO  Take 20 mg by mouth daily as needed. erectile dysfunction     sodium chloride 0.65 % nasal spray  Commonly known as:  OCEAN  Place 1 spray into the nose 2 (two) times daily as needed for congestion.     tamsulosin 0.4 MG Caps capsule  Commonly known as:  FLOMAX  Take 0.4 mg by mouth every evening.           Follow-up Information   Follow up with GIOFFRE,RONALD A, MD. Schedule an appointment as soon as possible for a visit in 2 weeks.   Specialty:  Orthopedic Surgery   Contact information:   [address] [place] [address] [phone]       Signed: Dimitri Ped [facility] 09/16/2012, 11:55 AM

## 2012-10-23 ENCOUNTER — Ambulatory Visit (HOSPITAL_COMMUNITY)
Admission: RE | Admit: 2012-10-23 | Discharge: 2012-10-23 | Disposition: A | Discharge status: Home / Self Care | Payer: Medicare Other | Source: Ambulatory Visit | Attending: Orthopedic Surgery | Admitting: Orthopedic Surgery

## 2012-10-23 DIAGNOSIS — M6281 Muscle weakness (generalized): Secondary | ICD-10-CM | POA: Diagnosis not present

## 2012-10-23 DIAGNOSIS — M25519 Pain in unspecified shoulder: Secondary | ICD-10-CM | POA: Diagnosis not present

## 2012-10-23 DIAGNOSIS — M25619 Stiffness of unspecified shoulder, not elsewhere classified: Secondary | ICD-10-CM | POA: Insufficient documentation

## 2012-10-23 DIAGNOSIS — I10 Essential (primary) hypertension: Secondary | ICD-10-CM | POA: Diagnosis not present

## 2012-10-23 DIAGNOSIS — IMO0001 Reserved for inherently not codable concepts without codable children: Secondary | ICD-10-CM | POA: Diagnosis not present

## 2012-10-23 DIAGNOSIS — Z9889 Other specified postprocedural states: Secondary | ICD-10-CM | POA: Insufficient documentation

## 2012-10-23 NOTE — Evaluation (Signed)
Occupational Therapy Evaluation  Patient Details  Name: James Dyer MRN: [id number] Date of Birth: [birth year]  Today's Date: 10/23/2012 Time: 6578-4696 OT Time Calculation (min): 27 min OT eval 2952-8413 10' MFR 2440-1027 17'  Visit#: 1 of 12  Re-eval: 11/20/12  Assessment Diagnosis: s/p Left rotator cuff repair Surgical Date: 09/09/12 Next MD Visit: 2 weeks  Authorization: Medicare  Authorization Time Period: Before 10th visit  Authorization Visit#: 1 of 10   Past Medical History:  Past Medical History  Diagnosis Date  . Hypertension   . Hypercholesterolemia   . 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 Borderline diabetes   . Cancer 2013    prostate   . Hepatitis     "pt told had been exposed to hepatitis when he went to donate blood"  . Nephrolithiasis   . 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 Foot surgery Right 1980's    calcium deposit removed  . Colonoscopy N/A 07/22/2012    Procedure: COLONOSCOPY;  Surgeon: Dalia Heading, MD;  Location: [facility];  Service: Gastroenterology;  Laterality: N/A;  . Prostate biopsy  2013  . Shoulder open rotator cuff repair Left 09/09/2012    Procedure: LEFT ROTATOR CUFF REPAIR SHOULDER OPEN;  Surgeon: Jacki Cones, MD;  Location: [facility];  Service: Orthopedics;  Laterality: Left;    Subjective Symptoms/Limitations Symptoms: S: I have a hard time sensing where my feet are due to my braces in my shoes. That how I tore my tendon. I moved too fast and tripepd and fell.  Pertinent History: Patient is a 67 y/o male s/p Left shoulder RCR due to a rotator cuff tear sustained from a fall. Patient underwent surgery on 09/09/12. Dr. Bryson Ha has referred patient to occupational therapy for evaluation and treatment.  Patient Stated Goals: To use arm normally.  Pain  Assessment Currently in Pain?: No/denies  Precautions/Restrictions  Precautions Precautions: Shoulder Restrictions Weight Bearing Restrictions: No  Balance Screening Balance Screen Has the patient fallen in the past 6 months: Yes How many times?: 1  Prior Functioning  Prior Function Level of Independence: Independent with basic ADLs;Independent with gait  Able to Take Stairs?: Yes Driving: Yes Vocation: Retired Leisure: Hobbies-yes (Comment) Comments: likes to restore cars and goes to car shows frequently.   Assessment ADL/Vision/Perception ADL ADL Comments: Difficulty getting jacket on/off, reaching up overhead Dominant Hand: Right Vision - History Baseline Vision: Wears glasses all the time  Cognition/Observation Cognition Overall Cognitive Status: Within Functional Limits for tasks assessed Arousal/Alertness: Awake/alert Orientation Level: Oriented X4   Additional Assessments LUE Assessment LUE Assessment:  (assessed supine. IR/ER adducted) LUE AROM (degrees) Left Shoulder Flexion: 125 Degrees Left Shoulder ABduction: 72 Degrees Left Shoulder Internal Rotation: 53 Degrees Left Shoulder External Rotation: 78 Degrees LUE PROM (degrees) Left Shoulder Flexion: 135 Degrees Left Shoulder ABduction: 90 Degrees Left Shoulder Internal Rotation: 85 Degrees Left Shoulder External Rotation: 85 Degrees Palpation Palpation: Max fascial restrictions in left upper arm, trapezius, and scapularis region.      Exercise/Treatments     Manual Therapy Manual Therapy: Myofascial release Myofascial Release: MFR to left upper arm to decrease fascial restrictions and increase joint mobility in a pain free zone.   Occupational Therapy Assessment and Plan OT Assessment and Plan Clinical Impression Statement: A: 67 year old  male s/p left rotator cuff repair presents with increased pain and fascial restrictions and decreased A/PROM and strength, causing decreased I with all  B/IADLs and leisure activities.  Pt will benefit from skilled therapeutic intervention in order to improve on the following deficits: Increased fascial restricitons;Decreased range of motion;Pain;Decreased strength Rehab Potential: Excellent OT Frequency: Min 2X/week OT Duration: 6 weeks OT Treatment/Interventions: Self-care/ADL training;Therapeutic activities;Therapeutic exercise;Modalities;Manual therapy;Patient/family education OT Plan: P: Skilled OT intervention to decrease pain and fascial restrictions and increase pain free mobility in left shoulder in order to return to prior level of independence with all B/IADLs and leisure activities. Treatment Plan: Progress as tolerated. AARROM in supine, seated elev, ext, row, ball stretches, thumb tacks, wall wash   Goals Short Term Goals Time to Complete Short Term Goals: 3 weeks Short Term Goal 1: Patient will be educated on HEP.  Short Term Goal 2: Patient will increase PROM to WNL in his left shoulder for increased independence with donning and doffing shirts.  Short Term Goal 3: Patient will have 4-/5 strength in his left shoulder in order to lift heavy items up and overhead into cabinets.  Short Term Goal 4: Patient will decrease pain to 3/10 when sleeping.  Short Term Goal 5: Patient will decrease fascial restrictions from max to mod in his left shoulder region for increased mobility with daily activities.  Long Term Goals Time to Complete Long Term Goals: 6 weeks Long Term Goal 1: Patient will return to highest level of indepedence with all daily and leisure activities.  Long Term Goal 2: Patient will have 5/5 strength in his left shoulder in order to lift and carry car parts/accessories when needed.  Long Term Goal 3: Patient will decrease pain to 1/10 during daily activities.  Long Term Goal 4: Patient will decrease fascial restrictions from mod to min in his left shoulder region for increased mobility with daily activities.   Problem  List Patient Active Problem List   Diagnosis Date Noted  . Pain in joint, shoulder region 10/23/2012  . Muscle weakness (generalized) 10/23/2012  . S/P rotator cuff repair 10/23/2012  . Complete tear of rotator cuff 09/09/2012  . History of radiation therapy   . Prostate cancer 12/18/2010    End of Session Activity Tolerance: Patient tolerated treatment well General Behavior During Therapy: [facility] for tasks assessed/performed OT Plan of Care OT Home Exercise Plan: shoulder stretches OT Patient Instructions: handout - scanned Consulted and Agree with Plan of Care: Patient  GO Functional Assessment Tool Used: FOTO score 50/100 Functional Limitation: Carrying, moving and handling objects Carrying, Moving and Handling Objects Current Status ([id number]): At least 40 percent but less than 60 percent impaired, limited or restricted Carrying, Moving and Handling Objects Goal Status [phone]): At least 1 percent but less than 20 percent impaired, limited or restricted  Limmie Patricia, OTR/L,CBIS   10/23/2012, 4:52 PM  Physician Documentation Your signature is required to indicate approval of the treatment plan as stated above.  Please sign and either send electronically or make a copy of this report for your files and return this physician signed original.  Please mark one 1.__approve of plan  2. ___approve of plan with the following conditions.   ______________________________  _____________________ Physician Signature                                                                                                             Date

## 2012-10-29 ENCOUNTER — Ambulatory Visit (HOSPITAL_COMMUNITY)
Admission: RE | Admit: 2012-10-29 | Discharge: 2012-10-29 | Disposition: A | Discharge status: Home / Self Care | Payer: Medicare Other | Source: Ambulatory Visit | Attending: Family Medicine | Admitting: Family Medicine

## 2012-10-29 DIAGNOSIS — I10 Essential (primary) hypertension: Secondary | ICD-10-CM | POA: Diagnosis not present

## 2012-10-29 DIAGNOSIS — M25619 Stiffness of unspecified shoulder, not elsewhere classified: Secondary | ICD-10-CM | POA: Diagnosis not present

## 2012-10-29 DIAGNOSIS — M25519 Pain in unspecified shoulder: Secondary | ICD-10-CM | POA: Diagnosis not present

## 2012-10-29 DIAGNOSIS — IMO0001 Reserved for inherently not codable concepts without codable children: Secondary | ICD-10-CM | POA: Diagnosis not present

## 2012-10-29 DIAGNOSIS — M6281 Muscle weakness (generalized): Secondary | ICD-10-CM | POA: Diagnosis not present

## 2012-10-29 NOTE — Progress Notes (Signed)
Occupational Therapy Treatment Patient Details  Name: James Dyer MRN: [id number] Date of Birth: July 17, 1945  Today's Date: 10/29/2012 Time: 7829-5621 OT Time Calculation (min): 45 min MFR 308-657 13' Therex 858-930 32'  Visit#: 2 of 12  Re-eval: 11/20/12    Authorization: Medicare  Authorization Time Period: Before 10th visit  Authorization Visit#: 2 of 10  Subjective Symptoms/Limitations Symptoms: S: I did the exercises. I"m not sure I'm doing some of them correctly. I did them fine when I was here but now I'm not sure.  Pain Assessment Currently in Pain?: Yes Pain Score: 1  Pain Location: Shoulder Pain Orientation: Left Pain Type: Acute pain  Precautions/Restrictions  Precautions Precautions: Shoulder  Exercise/Treatments Supine Protraction: PROM;AAROM;10 reps Horizontal ABduction: PROM;AAROM;10 reps External Rotation: PROM;AAROM;10 reps Internal Rotation: PROM;AAROM;10 reps Flexion: PROM;AAROM;10 reps ABduction: PROM;AAROM;10 reps;Limitations ABduction Limitations: therapist assisted Seated Elevation: AROM;15 reps Extension: AROM;15 reps Row: AROM;15 reps Pulleys Flexion: 1 minute ABduction: 1 minute Therapy Ball Flexion: 15 reps ABduction: 15 reps ROM / Strengthening / Isometric Strengthening Thumb Tacks: 1' Prot/Ret//Elev/Dep: 1'       Manual Therapy Manual Therapy: Myofascial release Myofascial Release: MFR and manual stretching to left upper arm to decrease fascial restrictions and increase joint mobility in a pain free zone.   Occupational Therapy Assessment and Plan OT Assessment and Plan Clinical Impression Statement: A: Patient completed all exercises with some pain. Pain present primarily during supine AAROM horizontal abduction.  OT Plan: P: Work on independence with AAROM supine abduction. Forgot to review towel stretch and wall stretch last time. Patient was unsure if he was completing correctly.    Goals Short Term Goals Time to  Complete Short Term Goals: 3 weeks Short Term Goal 1: Patient will be educated on HEP.  Short Term Goal 1 Progress: Progressing toward goal Short Term Goal 2: Patient will increase PROM to WNL in his left shoulder for increased independence with donning and doffing shirts.  Short Term Goal 2 Progress: Progressing toward goal Short Term Goal 3: Patient will have 4-/5 strength in his left shoulder in order to lift heavy items up and overhead into cabinets.  Short Term Goal 3 Progress: Progressing toward goal Short Term Goal 4: Patient will decrease pain to 3/10 when sleeping.  Short Term Goal 4 Progress: Progressing toward goal Short Term Goal 5: Patient will decrease fascial restrictions from max to mod in his left shoulder region for increased mobility with daily activities.  Short Term Goal 5 Progress: Progressing toward goal Long Term Goals Time to Complete Long Term Goals: 6 weeks Long Term Goal 1: Patient will return to highest level of indepedence with all daily and leisure activities.  Long Term Goal 1 Progress: Progressing toward goal Long Term Goal 2: Patient will have 5/5 strength in his left shoulder in order to lift and carry car parts/accessories when needed.  Long Term Goal 2 Progress: Progressing toward goal Long Term Goal 3: Patient will decrease pain to 1/10 during daily activities.  Long Term Goal 3 Progress: Progressing toward goal Long Term Goal 4: Patient will decrease fascial restrictions from mod to min in his left shoulder region for increased mobility with daily activities.  Long Term Goal 4 Progress: Progressing toward goal  Problem List Patient Active Problem List   Diagnosis Date Noted  . Pain in joint, shoulder region 10/23/2012  . Muscle weakness (generalized) 10/23/2012  . S/P rotator cuff repair 10/23/2012  . Complete tear of rotator cuff 09/09/2012  . History of radiation  therapy   . Prostate cancer 12/18/2010    End of Session Activity Tolerance:  Patient tolerated treatment well General Behavior During Therapy: [facility] for tasks assessed/performed   Limmie Patricia, OTR/L,CBIS   10/29/2012, 9:27 AM

## 2012-10-31 ENCOUNTER — Ambulatory Visit (HOSPITAL_COMMUNITY)
Admission: RE | Admit: 2012-10-31 | Discharge: 2012-10-31 | Disposition: A | Discharge status: Home / Self Care | Payer: Medicare Other | Source: Ambulatory Visit | Attending: Family Medicine | Admitting: Family Medicine

## 2012-10-31 NOTE — Progress Notes (Signed)
Occupational Therapy Treatment Patient Details  Name: James Dyer MRN: [id number] Date of Birth: Sep 13, 1945  Today's Date: 10/31/2012 Time: 4098-1191 OT Time Calculation (min): 30 min Manual therapy [phone] 12' Therapeutic exercises [phone] 18' Visit#: 3 of 12  Re-eval: 11/20/12    Authorization: Medicare  Authorization Time Period: Before 10th visit  Authorization Visit#: 3 of 10  Subjective S:  I came Wednesday and I have been doing my exercises. Limitations: progress as tolerated Pain Assessment Currently in Pain?: Yes Pain Score: 1  Pain Location: Shoulder Pain Orientation: Left Pain Type: Acute pain  Precautions/Restrictions   progress as tolerated  Exercise/Treatments Supine Protraction: PROM;AAROM;10 reps Horizontal ABduction: PROM;AAROM;10 reps External Rotation: PROM;AAROM;10 reps Internal Rotation: PROM;AAROM;10 reps Flexion: PROM;AAROM;10 reps ABduction: PROM;AAROM;10 reps;Limitations ABduction Limitations: therapist assisted Seated Elevation: AROM;15 reps Extension: AROM;15 reps Row: AROM;15 reps Pulleys Flexion: 2 minutes ABduction: 2 minutes Therapy Ball Flexion: 20 reps ABduction: 20 reps ROM / Strengthening / Isometric Strengthening Thumb Tacks: 1' Prot/Ret//Elev/Dep: 1'      Manual Therapy Manual Therapy: Myofascial release Myofascial Release: MFR and manual stretching to left upper arm to decrease fascial restrictions and increase joint mobility in a pain free zone.  Occupational Therapy Assessment and Plan OT Assessment and Plan Clinical Impression Statement: A:  Good form with prot/ret//elev/dep exercises, required OTR/L facilitation to mainitan LUE position with thumbtacks and prot/ret//elev/dep OT Plan: P:  Complete wall exercises without facilitation from OTR/L as patients strength of proximal shoulder improves.    Goals Short Term Goals Time to Complete Short Term Goals: 3 weeks Short Term Goal 1: Patient will be educated  on HEP.  Short Term Goal 1 Progress: Progressing toward goal Short Term Goal 2: Patient will increase PROM to WNL in his left shoulder for increased independence with donning and doffing shirts.  Short Term Goal 2 Progress: Progressing toward goal Short Term Goal 3: Patient will have 4-/5 strength in his left shoulder in order to lift heavy items up and overhead into cabinets.  Short Term Goal 3 Progress: Progressing toward goal Short Term Goal 4: Patient will decrease pain to 3/10 when sleeping.  Short Term Goal 4 Progress: Progressing toward goal Short Term Goal 5: Patient will decrease fascial restrictions from max to mod in his left shoulder region for increased mobility with daily activities.  Short Term Goal 5 Progress: Progressing toward goal Long Term Goals Time to Complete Long Term Goals: 6 weeks Long Term Goal 1: Patient will return to highest level of indepedence with all daily and leisure activities.  Long Term Goal 1 Progress: Progressing toward goal Long Term Goal 2: Patient will have 5/5 strength in his left shoulder in order to lift and carry car parts/accessories when needed.  Long Term Goal 2 Progress: Progressing toward goal Long Term Goal 3: Patient will decrease pain to 1/10 during daily activities.  Long Term Goal 3 Progress: Progressing toward goal Long Term Goal 4: Patient will decrease fascial restrictions from mod to min in his left shoulder region for increased mobility with daily activities.  Long Term Goal 4 Progress: Progressing toward goal  Problem List Patient Active Problem List   Diagnosis Date Noted  . Pain in joint, shoulder region 10/23/2012  . Muscle weakness (generalized) 10/23/2012  . S/P rotator cuff repair 10/23/2012  . Complete tear of rotator cuff 09/09/2012  . History of radiation therapy   . Prostate cancer 12/18/2010    End of Session Activity Tolerance: Patient tolerated treatment well General Behavior During Therapy:  WFL for tasks  assessed/performed  GO    Shirlean Mylar, OTR/L  10/31/2012, 10:04 AM

## 2012-11-06 ENCOUNTER — Encounter (INDEPENDENT_AMBULATORY_CARE_PROVIDER_SITE_OTHER): Payer: Self-pay

## 2012-11-06 ENCOUNTER — Ambulatory Visit (HOSPITAL_COMMUNITY)
Admission: RE | Admit: 2012-11-06 | Discharge: 2012-11-06 | Disposition: A | Discharge status: Home / Self Care | Payer: Medicare Other | Source: Ambulatory Visit | Attending: Family Medicine | Admitting: Family Medicine

## 2012-11-06 NOTE — Progress Notes (Signed)
Occupational Therapy Treatment Patient Details  Name: James Dyer MRN: [id number] Date of Birth: 1945/11/30  Today's Date: 11/06/2012 Time: 0865-7846 OT Time Calculation (min): 40 min Manual therapy [phone] 11' Therapeutic exercises [phone] 29' Visit#: 4 of 12  Re-eval: 11/20/12    Authorization: Medicare  Authorization Time Period: Before 10th visit  Authorization Visit#: 4 of 10  Subjective S:  I went to Dr. Darrelyn Hillock yesterday and he was concerned my shoulder wasnt as strong as it should be. (Discussed healing process and protocol s/p rotator cuff repair - will begin strengthening once AROM is [facility]) Limitations: progress as tolerated Pain Assessment Currently in Pain?: No/denies Pain Score: 0-No pain  Precautions/Restrictions   progress as tolerated  Exercise/Treatments Supine Protraction: PROM;10 reps;AAROM;12 reps Horizontal ABduction: PROM;10 reps;AAROM;12 reps External Rotation: PROM;10 reps;AAROM;12 reps Internal Rotation: PROM;10 reps;AAROM;12 reps Flexion: PROM;10 reps;AAROM;12 reps ABduction: PROM;10 reps;AAROM;12 reps;Limitations ABduction Limitations: therapist assisted Seated Elevation: AROM;15 reps Extension: AROM;15 reps Row: AROM;15 reps Pulleys Flexion: 2 minutes ABduction: 2 minutes Therapy Ball Flexion: 20 reps ABduction: 20 reps Right/Left: 5 reps ROM / Strengthening / Isometric Strengthening Thumb Tacks: 1' Prot/Ret//Elev/Dep: 1' did not require assistance with maintaining arm position this date.       Manual Therapy Manual Therapy: Myofascial release Myofascial Release: MFR and manual stretching to left upper arm to decrease fascial restrictions and increase joint mobility in a pain free zone.  Occupational Therapy Assessment and Plan OT Assessment and Plan Clinical Impression Statement: A:  Attempted 15 repetitions of AAROM in supine, however fatigued after 13 repetitions.  OT Plan: P:  Complete 15 repetitions of AAROM without  fatiguing   Goals Short Term Goals Time to Complete Short Term Goals: 3 weeks Short Term Goal 1: Patient will be educated on HEP.  Short Term Goal 1 Progress: Progressing toward goal Short Term Goal 2: Patient will increase PROM to WNL in his left shoulder for increased independence with donning and doffing shirts.  Short Term Goal 2 Progress: Progressing toward goal Short Term Goal 3: Patient will have 4-/5 strength in his left shoulder in order to lift heavy items up and overhead into cabinets.  Short Term Goal 3 Progress: Progressing toward goal Short Term Goal 4: Patient will decrease pain to 3/10 when sleeping.  Short Term Goal 4 Progress: Progressing toward goal Short Term Goal 5: Patient will decrease fascial restrictions from max to mod in his left shoulder region for increased mobility with daily activities.  Short Term Goal 5 Progress: Progressing toward goal Long Term Goals Time to Complete Long Term Goals: 6 weeks Long Term Goal 1: Patient will return to highest level of indepedence with all daily and leisure activities.  Long Term Goal 1 Progress: Progressing toward goal Long Term Goal 2: Patient will have 5/5 strength in his left shoulder in order to lift and carry car parts/accessories when needed.  Long Term Goal 2 Progress: Progressing toward goal Long Term Goal 3: Patient will decrease pain to 1/10 during daily activities.  Long Term Goal 3 Progress: Progressing toward goal Long Term Goal 4: Patient will decrease fascial restrictions from mod to min in his left shoulder region for increased mobility with daily activities.  Long Term Goal 4 Progress: Progressing toward goal  Problem List Patient Active Problem List   Diagnosis Date Noted  . Pain in joint, shoulder region 10/23/2012  . Muscle weakness (generalized) 10/23/2012  . S/P rotator cuff repair 10/23/2012  . Complete tear of rotator cuff 09/09/2012  . History  of radiation therapy   . Prostate cancer  12/18/2010    End of Session Activity Tolerance: Patient tolerated treatment well General Behavior During Therapy: [facility] for tasks assessed/performed  GO    Shirlean Mylar, OTR/L  11/06/2012, 9:31 AM

## 2012-11-10 DIAGNOSIS — E119 Type 2 diabetes mellitus without complications: Secondary | ICD-10-CM | POA: Diagnosis not present

## 2012-11-10 DIAGNOSIS — E785 Hyperlipidemia, unspecified: Secondary | ICD-10-CM | POA: Diagnosis not present

## 2012-11-10 DIAGNOSIS — I10 Essential (primary) hypertension: Secondary | ICD-10-CM | POA: Diagnosis not present

## 2012-11-10 DIAGNOSIS — Z6838 Body mass index (BMI) 38.0-38.9, adult: Secondary | ICD-10-CM | POA: Diagnosis not present

## 2012-11-10 DIAGNOSIS — Z23 Encounter for immunization: Secondary | ICD-10-CM | POA: Diagnosis not present

## 2012-11-11 ENCOUNTER — Ambulatory Visit (HOSPITAL_COMMUNITY)
Admission: RE | Admit: 2012-11-11 | Discharge: 2012-11-11 | Disposition: A | Discharge status: Home / Self Care | Payer: Medicare Other | Source: Ambulatory Visit | Attending: Family Medicine | Admitting: Family Medicine

## 2012-11-13 ENCOUNTER — Ambulatory Visit (HOSPITAL_COMMUNITY)
Admission: RE | Admit: 2012-11-13 | Discharge: 2012-11-13 | Disposition: A | Discharge status: Home / Self Care | Payer: Medicare Other | Source: Ambulatory Visit | Attending: Family Medicine | Admitting: Family Medicine

## 2012-11-13 NOTE — Progress Notes (Signed)
Occupational Therapy Treatment Patient Details  Name: James Dyer MRN: [id number] Date of Birth: [birth year]  Today's Date: 11/11/2012 Time: 6578-4696 OT Time Calculation (min): 44 min Manual [id number] (15') TherExercises 2440-1027 (29')  Visit#: 5 of 12  Re-eval: 11/20/12    Authorization: Medicare  Authorization Time Period: Before 10th visit  Authorization Visit#: 5 of 10  Subjective Symptoms/Limitations Symptoms: S: It is sore feeling today... I took it easy yesterday and it felt so good and now I am paying for it. Pain Assessment Currently in Pain?: No/denies Pain Score: 0-No pain Pain Location: Shoulder Pain Orientation: Left Pain Type: Acute pain      Exercise/Treatments Supine Protraction: PROM;10 reps;AAROM;12 reps Horizontal ABduction: PROM;10 reps;AAROM;12 reps External Rotation: PROM;10 reps;AAROM;12 reps Internal Rotation: PROM;10 reps;AAROM;12 reps Flexion: PROM;10 reps;AAROM;12 reps ABduction: PROM;10 reps;AAROM;12 reps Seated Elevation: AROM;15 reps Extension: AROM;15 reps Row: AROM;15 reps External Rotation: AROM;10 reps Internal Rotation: AROM;10 reps Flexion: AAROM;10 reps Abduction: AAROM;10 reps   Pulleys Flexion: 2 minutes ABduction: 2 minutes Therapy Ball Flexion: 20 reps ABduction: 20 reps Right/Left: 5 reps ROM / Strengthening / Isometric Strengthening Thumb Tacks 1'     Manual Therapy Manual Therapy: Myofascial release Myofascial Release: MFR and manual stretching to left upper arm to decrease fascial restrictions and increase joint mobility in a pain free zone  Occupational Therapy Assessment and Plan OT Assessment and Plan Clinical Impression Statement: A: Completed 15 repetitions of AAROM in supine, continued to fatigue before 15; increased AROM seated exercises with continued difficutly and noted shoulder compensation OT Plan: P: Complete 15 repetitions of AAROM without fatiguing; begin 1# dumbell strengthening.     Goals Short Term Goals Short Term Goal 1: Patient will be educated on HEP.  Short Term Goal 1 Progress: Progressing toward goal Short Term Goal 2: Patient will increase PROM to WNL in his left shoulder for increased independence with donning and doffing shirts.  Short Term Goal 2 Progress: Progressing toward goal Short Term Goal 3: Patient will have 4-/5 strength in his left shoulder in order to lift heavy items up and overhead into cabinets.  Short Term Goal 3 Progress: Progressing toward goal Short Term Goal 4: Patient will decrease pain to 3/10 when sleeping.  Short Term Goal 4 Progress: Progressing toward goal Short Term Goal 5: Patient will decrease fascial restrictions from max to mod in his left shoulder region for increased mobility with daily activities.  Short Term Goal 5 Progress: Progressing toward goal Long Term Goals Long Term Goal 1: Patient will return to highest level of indepedence with all daily and leisure activities.  Long Term Goal 1 Progress: Progressing toward goal Long Term Goal 2: Patient will have 5/5 strength in his left shoulder in order to lift and carry car parts/accessories when needed.  Long Term Goal 2 Progress: Progressing toward goal Long Term Goal 3: Patient will decrease pain to 1/10 during daily activities.  Long Term Goal 3 Progress: Progressing toward goal Long Term Goal 4: Patient will decrease fascial restrictions from mod to min in his left shoulder region for increased mobility with daily activities.  Long Term Goal 4 Progress: Progressing toward goal  Problem List Patient Active Problem List   Diagnosis Date Noted  . Pain in joint, shoulder region 10/23/2012  . Muscle weakness (generalized) 10/23/2012  . S/P rotator cuff repair 10/23/2012  . Complete tear of rotator cuff 09/09/2012  . History of radiation therapy   . Prostate cancer 12/18/2010    End of Session Activity  Tolerance: Patient tolerated treatment well General Behavior  During Therapy: [facility] for tasks assessed/performed  GO    Velora Mediate, OTR/L 11/11/2012, 11:25 AM

## 2012-11-13 NOTE — Progress Notes (Signed)
Occupational Therapy Treatment Patient Details  Name: James Dyer MRN: [id number] Date of Birth: 03/30/1945  Today's Date: 11/13/2012 Time: 4098-1191 OT Time Calculation (min): 45 min Manual [id number] (12') TherExercises 1308-6578 (33')   Visit#: 6 of 12  Re-eval: 11/20/12    Authorization: Medicare  Authorization Time Period: Before 10th visit  Authorization Visit#: 6 of    Subjective Symptoms/Limitations Symptoms: S:  I dont really have pain unless I am really trying to reach for something.  Pain Assessment Currently in Pain?: No/denies Multiple Pain Sites: No     Exercise/Treatments Supine Protraction: PROM;10 reps;AAROM;15 reps Horizontal ABduction: PROM;10 reps;AAROM;15 reps External Rotation: PROM;10 reps;AAROM Internal Rotation: PROM;10 reps;AAROM;15 reps Flexion: PROM;10 reps;AAROM;15 reps ABduction: PROM;10 reps;AAROM;15 reps Seated Elevation: AROM;15 reps Extension: AROM;15 reps Row: AROM;15 reps External Rotation: AROM;15 reps Internal Rotation: AROM;15 reps Flexion: AAROM;12 reps Abduction: AAROM;12 reps Pulleys Flexion: 2 minutes ABduction: 2 minutes Therapy Ball Flexion: 20 reps ABduction: 20 reps Right/Left: 5 reps ROM / Strengthening / Isometric Strengthening Thumb Tacks: 2'       Manual Therapy Manual Therapy: Myofascial release Myofascial Release: MFR and manual stretching to left upper arm to decrease fascial restrictions and increase joint mobility in a pain free zone  Occupational Therapy Assessment and Plan OT Assessment and Plan Clinical Impression Statement: A: Pt completed 15 repetitions of AAROM in supine with no complaint of increased fatigue this date.  Demonstrates improving AROM in sitting with complaint of pain.  OT Plan: P: Begin 1# weights for strengthening   Goals Short Term Goals Short Term Goal 1: Patient will be educated on HEP.  Short Term Goal 1 Progress: Progressing toward goal Short Term Goal 2: Patient  will increase PROM to WNL in his left shoulder for increased independence with donning and doffing shirts.  Short Term Goal 2 Progress: Progressing toward goal Short Term Goal 3: Patient will have 4-/5 strength in his left shoulder in order to lift heavy items up and overhead into cabinets.  Short Term Goal 3 Progress: Progressing toward goal Short Term Goal 4: Patient will decrease pain to 3/10 when sleeping.  Short Term Goal 4 Progress: Progressing toward goal Short Term Goal 5: Patient will decrease fascial restrictions from max to mod in his left shoulder region for increased mobility with daily activities.  Short Term Goal 5 Progress: Progressing toward goal Long Term Goals Long Term Goal 1: Patient will return to highest level of indepedence with all daily and leisure activities.  Long Term Goal 1 Progress: Progressing toward goal Long Term Goal 2: Patient will have 5/5 strength in his left shoulder in order to lift and carry car parts/accessories when needed.  Long Term Goal 2 Progress: Progressing toward goal Long Term Goal 3: Patient will decrease pain to 1/10 during daily activities.  Long Term Goal 3 Progress: Progressing toward goal Long Term Goal 4: Patient will decrease fascial restrictions from mod to min in his left shoulder region for increased mobility with daily activities.  Long Term Goal 4 Progress: Progressing toward goal  Problem List Patient Active Problem List   Diagnosis Date Noted  . Pain in joint, shoulder region 10/23/2012  . Muscle weakness (generalized) 10/23/2012  . S/P rotator cuff repair 10/23/2012  . Complete tear of rotator cuff 09/09/2012  . History of radiation therapy   . Prostate cancer 12/18/2010    End of Session Activity Tolerance: Patient tolerated treatment well General Behavior During Therapy: [facility] for tasks assessed/performed  GO    Herbert Seta  Konrad Dolores, OTR/L  11/13/2012, 12:57 PM

## 2012-11-19 ENCOUNTER — Ambulatory Visit (HOSPITAL_COMMUNITY)
Admission: RE | Admit: 2012-11-19 | Discharge: 2012-11-19 | Disposition: A | Discharge status: Home / Self Care | Payer: Medicare Other | Source: Ambulatory Visit | Attending: Family Medicine | Admitting: Family Medicine

## 2012-11-19 NOTE — Progress Notes (Signed)
Occupational Therapy Treatment Patient Details  Name: James Dyer MRN: [id number] Date of Birth: Nov 07, 1945  Today's Date: 11/19/2012 Time: 4098-1191 OT Time Calculation (min): 45 min  Visit#: 3 of  3  Re-eval:  11/22/12 Assessment Diagnosis: s/p Left rotator cuff repair Surgical Date: 09/09/12 Next MD Visit: next   Authorization:   3 Authorization Time Period:   10/23/12-11/22/12 Authorization Visit#:  3 of  12  Subjective Pain Assessment Currently in Pain?: No/denies Pain Score: 0-No pain Pain Location: Shoulder Pain Orientation: Left Pain Type: Acute pain  Precautions/Restrictions  Precautions Precautions: Shoulder Restrictions Weight Bearing Restrictions: No  Exercise/Treatments  Manual Therapy Manual Therapy: Myofascial release Myofascial Release:  (MFR and manual stretching to left upper arm to decrease fasc)  Occupational Therapy Assessment and Plan OT Assessment and Plan Clinical Impression Statement: Patient reports increased tightness at bicipital groove, no pain at rest, mild increase 5/10 with activity.  Patient reported doing more at home yesterday.  He also required requent reminders to decrease should elevation with exs.  No pain and decreased tightness reported after treatment session.  Rehab Potential: Good OT Treatment/Interventions: Self-care/ADL training;Therapeutic exercise;Manual therapy;Modalities OT Plan: continue POC    Goals    Problem List Patient Active Problem List   Diagnosis Date Noted  . Pain in joint, shoulder region 10/23/2012  . Muscle weakness (generalized) 10/23/2012  . S/P rotator cuff repair 10/23/2012  . Complete tear of rotator cuff 09/09/2012  . History of radiation therapy   . Prostate cancer 12/18/2010    OT Plan of Care OT Home Exercise Plan: cont  OT Patient Instructions: Increase attention to shoulder elevation with activities at home.  Educated in bicipital groove tightness.    GO     [facility] 11/19/2012, 9:35 AM

## 2012-11-21 ENCOUNTER — Ambulatory Visit (HOSPITAL_COMMUNITY)
Admission: RE | Admit: 2012-11-21 | Discharge: 2012-11-21 | Disposition: A | Discharge status: Home / Self Care | Payer: Medicare Other | Source: Ambulatory Visit | Attending: Family Medicine | Admitting: Family Medicine

## 2012-11-21 NOTE — Progress Notes (Signed)
Occupational Therapy Treatment Patient Details  Name: James Dyer MRN: [id number] Date of Birth: 1945/06/24  Today's Date: 11/21/2012 Time: 0845-     Visit#: 6 of 10  Re-eval:      Authorization:    Authorization Time Period:    Authorization Visit#:   of    Subjective Pain Assessment Pain Score: 0-No pain Pain Location: Shoulder Pain Orientation: Left Pain Type: Acute pain  Precautions/Restrictions     Exercise/Treatments Supine Protraction: PROM;10 reps;AAROM (in sl 10 reps ) Horizontal ABduction: PROM;AAROM (in sl) External Rotation: PROM;AAROM (in sl ) Internal Rotation: PROM;AAROM (in sl ) Flexion: PROM;AAROM (in sl ) ABduction: PROM;AAROM (in sl ) Seated Elevation: AROM Extension: AROM;10 reps Row: AROM;10 reps External Rotation: 10 reps (no wt;  AAROM w/1# dumbell) Internal Rotation: AROM;10 reps (; AAROM w 1#dumbell ) Flexion: AROM;10 reps (no wt; 5 w./1# dowel ) Abduction: AROM;10 reps (no wt; 5 w 1# dowel ) Prone    Sidelying   Standing   Pulleys Flexion: 2 minutes ABduction: 2 minutes Therapy Ball Flexion: 20 reps ABduction: 20 reps Right/Left: 10 reps ROM / Strengthening / Isometric Strengthening UBE (Upper Arm Bike): 2x2 Thumb Tacks: 2'   Stretches   Power Press photographer Therapy Manual Therapy: Myofascial release Myofascial Release:  (also performed scapular mobilizatin for increased ROM.  )  Occupational Therapy Assessment and Plan OT Assessment and Plan Clinical Impression Statement: Pteint with decreased palpable muscle tightness in upper UE/biciptal groove area today.  Good increase in end ROM PROM today.  Tolerated AAROM in sidelying well with good scapular motion.  Quick fatigue with thumb tack, ball and UBE.  Progressing well with ROM.   Pt will benefit from skilled therapeutic intervention in order to improve on the following deficits: Impaired UE functional use Rehab Potential:  Good OT Plan: continue POC    Goals    Problem List Patient Active Problem List   Diagnosis Date Noted  . Pain in joint, shoulder region 10/23/2012  . Muscle weakness (generalized) 10/23/2012  . S/P rotator cuff repair 10/23/2012  . Complete tear of rotator cuff 09/09/2012  . History of radiation therapy   . Prostate cancer 12/18/2010    End of Session Activity Tolerance: Patient tolerated treatment well OT Plan of Care OT Home Exercise Plan: Cont   GO    [facility] 11/21/2012, 9:18 AM

## 2012-11-26 ENCOUNTER — Ambulatory Visit (HOSPITAL_COMMUNITY)
Admission: RE | Admit: 2012-11-26 | Discharge: 2012-11-26 | Disposition: A | Discharge status: Home / Self Care | Payer: Medicare Other | Source: Ambulatory Visit | Attending: Family Medicine | Admitting: Family Medicine

## 2012-11-27 ENCOUNTER — Ambulatory Visit (HOSPITAL_COMMUNITY)
Admission: RE | Admit: 2012-11-27 | Discharge: 2012-11-27 | Disposition: A | Discharge status: Home / Self Care | Payer: Medicare Other | Source: Ambulatory Visit | Attending: Family Medicine | Admitting: Family Medicine

## 2012-11-27 NOTE — Progress Notes (Addendum)
Occupational Therapy Treatment Patient Details  Name: James Dyer MRN: [id number] Date of Birth: Dec 16, 1945  Today's Date: 11/27/2012 Time: 4782-9562 OT Time Calculation (min): 44 min Manual 1308-6578 (18') TherExercises 4696-2952(84')  Visit#: 10 of  16  Re-eval: 12/11/12    Authorization: Medicare  Authorization Time Period: Before 17th visit   Authorization Visit#: 8 of 16  Subjective Symptoms/Limitations Symptoms: S:  It is getting there but boy can i feel it.... it is better thanit was when i came in here.   Pain Assessment Currently in Pain?: No/denies Pain Score: 5  Pain Location: Shoulder Pain Orientation: Left Pain Type: Acute pain Multiple Pain Sites: No       Exercise/Treatments Supine Protraction: PROM;10 reps;AAROM;12 reps Horizontal ABduction: PROM;10 reps;AAROM;12 reps External Rotation: PROM;10 reps;AAROM;12 reps Internal Rotation: PROM;10 reps;AAROM;12 reps Flexion: PROM;10 reps;AAROM;12 reps ABduction: PROM;10 reps;AAROM;12 reps Seated Flexion: AAROM;15 reps Abduction: AAROM;15 reps Pulleys Flexion: 3 minutes ABduction: 3 minutes Therapy Ball Right/Left: 5 reps (each direction ) ROM / Strengthening / Isometric Strengthening Wall Wash: 1' Proximal Shoulder Strengthening, Supine: 10x Flexion: 3X3" Extension: 3X3" External Rotation: 3X3" Internal Rotation: 3X3" ABduction: 3X3" ADduction: 3X3"  Manual Therapy Manual Therapy: Myofascial release Myofascial Release: MFR and manual stretching to left upper arm to decrease fascial restrictions and increase joint mobility in a pain free zone Weight Bearing Technique Weight Bearing Technique: No  Occupational Therapy Assessment and Plan OT Assessment and Plan Clinical Impression Statement: A: Patient arrives today for back to back sessions this week with report of decreased tightness in shoulder this date.  He tolerates supine AAROM/AROM exercises with use of dowel for bilateral assistance  with complaint of discomfort on come down.  Increased time for pulleys this date and initiated isometric exercises this date with good tolerance.     Goals Short Term Goals Short Term Goal 1: Patient will be educated on HEP.  Short Term Goal 1 Progress: Met Short Term Goal 2: Patient will increase PROM to WNL in his left shoulder for increased independence with donning and doffing shirts.  Short Term Goal 2 Progress: Progressing toward goal Short Term Goal 3: Patient will have 4-/5 strength in his left shoulder in order to lift heavy items up and overhead into cabinets.  Short Term Goal 3 Progress: Progressing toward goal Short Term Goal 4: Patient will decrease pain to 3/10 when sleeping.  Short Term Goal 4 Progress: Met Short Term Goal 5: Patient will decrease fascial restrictions from max to mod in his left shoulder region for increased mobility with daily activities.  Short Term Goal 5 Progress: Met Long Term Goals Long Term Goal 1: Patient will return to highest level of indepedence with all daily and leisure activities.  Long Term Goal 1 Progress: Progressing toward goal Long Term Goal 2: Patient will have 5/5 strength in his left shoulder in order to lift and carry car parts/accessories when needed.  Long Term Goal 2 Progress: Progressing toward goal Long Term Goal 3: Patient will decrease pain to 1/10 during daily activities.  Long Term Goal 3 Progress: Progressing toward goal Long Term Goal 4: Patient will decrease fascial restrictions from mod to min in his left shoulder region for increased mobility with daily activities.  Long Term Goal 4 Progress: Progressing toward goal  Problem List Patient Active Problem List   Diagnosis Date Noted  . Pain in joint, shoulder region 10/23/2012  . Muscle weakness (generalized) 10/23/2012  . S/P rotator cuff repair 10/23/2012  . Complete tear of rotator  cuff 09/09/2012  . History of radiation therapy   . Prostate cancer 12/18/2010     End of Session Activity Tolerance: Patient tolerated treatment well General Behavior During Therapy: [facility] for tasks assessed/performed OT Plan of Care OT Patient Instructions: P: Increased strengthening activities and incorporate more sidelying exercises.   GO    Velora Mediate, OTR/L  11/27/2012, 9:55 AM

## 2012-11-27 NOTE — Evaluation (Addendum)
Occupational Therapy Evaluation  Patient Details  Name: James Dyer MRN: [id number] Date of Birth: 12-Nov-1945  Today's Date: 11/26/2012 Time: 4098-1191 OT Time Calculation (min): 46 min Reassessment 4782-9562 Manual 1308-6578 (15') TherExercise 4696-2952 (19')  Visit#: 9 of 16  Re-eval: 12/11/12  Assessment Diagnosis: s/p Left rotator cuff repair Surgical Date: 09/09/12 Next MD Visit: three weeks   Authorization: Medicare  Authorization Time Period: Before 17th visit   Authorization Visit#: 7 of 16   Past Medical History:  Past Medical History  Diagnosis Date  . Hypertension   . Hypercholesterolemia   . 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 Borderline diabetes   . Cancer 2013    prostate   . Hepatitis     "pt told had been exposed to hepatitis when he went to donate blood"  . Nephrolithiasis   . 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 Foot surgery Right 1980's    calcium deposit removed  . Colonoscopy N/A 07/22/2012    Procedure: COLONOSCOPY;  Surgeon: Dalia Heading, MD;  Location: [facility];  Service: Gastroenterology;  Laterality: N/A;  . Prostate biopsy  2013  . Shoulder open rotator cuff repair Left 09/09/2012    Procedure: LEFT ROTATOR CUFF REPAIR SHOULDER OPEN;  Surgeon: Jacki Cones, MD;  Location: [facility];  Service: Orthopedics;  Laterality: Left;    Subjective Symptoms/Limitations Symptoms: S:  I can feel it still but so far Im not having any pain.  Patient Stated Goals: To use arm normally.  Pain Assessment Currently in Pain?: No/denies Pain Location: Shoulder Pain Orientation: Left Pain Type: Acute pain Multiple Pain Sites: No  Precautions/Restrictions  Precautions Precautions: Shoulder Restrictions Weight Bearing Restrictions: No  Assessment Additional  Assessments LUE AROM (degrees) Left Shoulder Flexion: 120 Degrees Left Shoulder ABduction: 88 Degrees Left Shoulder Internal Rotation: 68 Degrees Left Shoulder External Rotation: 78 Degrees Palpation Palpation: min-mod fascial restrictions in left upper arm, trapezius, and scapularis region.      Exercise/Treatments Supine Protraction: PROM;10 reps;AAROM;12 reps Horizontal ABduction: PROM;10 reps;AAROM;12 reps External Rotation: PROM;10 reps;AAROM;12 reps Internal Rotation: PROM;10 reps;AAROM;12 reps Flexion: PROM;10 reps;AAROM;12 reps ABduction: PROM;10 reps;AAROM;12 reps Seated Elevation: AROM;15 reps Extension: AROM;15 reps Row: AROM;15 reps Flexion: AAROM;15 reps Abduction: AAROM;15 reps   Therapy Ball Right/Left: 5 reps (each direction) ROM / Strengthening / Isometric Strengthening Wall Wash: 1' Thumb Tacks: 2'       Manual Therapy Manual Therapy: Myofascial release Myofascial Release: MFR and manual stretching to left upper arm to decrease fascial restrictions and increase joint mobility in a pain free zone  Occupational Therapy Assessment and Plan OT Assessment and Plan Clinical Impression Statement: A: Reassessment completed this date with improved AROM in sitting; Improved FOTO scores and continues with complaint of increased weakness. Patient does report increasing functional use of UE in daily tasks however requires assistance from other hand/arm to complete.   Goals Short Term Goals Short Term Goal 1: Patient will be educated on HEP.  Short Term Goal 1 Progress: Met Short Term Goal 2: Patient will increase PROM to WNL in his left shoulder for increased independence with donning and doffing shirts.  Short Term Goal 2 Progress: Progressing toward goal Short Term Goal 3: Patient will have 4-/5 strength in his left shoulder in order to lift heavy items  up and overhead into cabinets.  Short Term Goal 3 Progress: Progressing toward goal Short Term Goal 4:  Patient will decrease pain to 3/10 when sleeping.  Short Term Goal 4 Progress: Met Short Term Goal 5: Patient will decrease fascial restrictions from max to mod in his left shoulder region for increased mobility with daily activities.  Short Term Goal 5 Progress: Met Long Term Goals Long Term Goal 1: Patient will return to highest level of indepedence with all daily and leisure activities.  Long Term Goal 1 Progress: Progressing toward goal Long Term Goal 2: Patient will have 5/5 strength in his left shoulder in order to lift and carry car parts/accessories when needed.  Long Term Goal 2 Progress: Progressing toward goal Long Term Goal 3: Patient will decrease pain to 1/10 during daily activities.  Long Term Goal 3 Progress: Progressing toward goal Long Term Goal 4: Patient will decrease fascial restrictions from mod to min in his left shoulder region for increased mobility with daily activities.  Long Term Goal 4 Progress: Progressing toward goal  Problem List Patient Active Problem List   Diagnosis Date Noted  . Pain in joint, shoulder region 10/23/2012  . Muscle weakness (generalized) 10/23/2012  . S/P rotator cuff repair 10/23/2012  . Complete tear of rotator cuff 09/09/2012  . History of radiation therapy   . Prostate cancer 12/18/2010    End of Session Activity Tolerance: Patient tolerated treatment well General Behavior During Therapy: [facility] for tasks assessed/performed OT Plan of Care OT Patient Instructions: P: Increased strengthening activities and incorporate more sidelying exercises.   GO Functional Assessment Tool Used: FOTO score 56/100 Functional Limitation: Carrying, moving and handling objects Carrying, Moving and Handling Objects Current Status ([id number]): At least 20 percent but less than 40 percent impaired, limited or restricted Carrying, Moving and Handling Objects Goal Status [phone]): At least 1 percent but less than 20 percent impaired, limited or  restricted  Velora Mediate, OTR/L  11/27/2012, 4:09 AM  Physician Documentation Your signature is required to indicate approval of the treatment plan as stated above.  Please sign and either send electronically or make a copy of this report for your files and return this physician signed original.  Please mark one 1.__approve of plan  2. ___approve of plan with the following conditions.   ______________________________                                                          _____________________ Physician Signature                                                                                                             Date

## 2012-12-02 ENCOUNTER — Ambulatory Visit (HOSPITAL_COMMUNITY)
Admission: RE | Admit: 2012-12-02 | Discharge: 2012-12-02 | Disposition: A | Discharge status: Home / Self Care | Payer: Medicare Other | Source: Ambulatory Visit | Attending: Family Medicine | Admitting: Family Medicine

## 2012-12-02 DIAGNOSIS — IMO0001 Reserved for inherently not codable concepts without codable children: Secondary | ICD-10-CM | POA: Insufficient documentation

## 2012-12-02 DIAGNOSIS — M6281 Muscle weakness (generalized): Secondary | ICD-10-CM | POA: Diagnosis not present

## 2012-12-02 DIAGNOSIS — M25619 Stiffness of unspecified shoulder, not elsewhere classified: Secondary | ICD-10-CM | POA: Insufficient documentation

## 2012-12-02 DIAGNOSIS — M25519 Pain in unspecified shoulder: Secondary | ICD-10-CM | POA: Diagnosis not present

## 2012-12-02 DIAGNOSIS — I10 Essential (primary) hypertension: Secondary | ICD-10-CM | POA: Insufficient documentation

## 2012-12-02 NOTE — Progress Notes (Signed)
Occupational Therapy Treatment Patient Details  Name: IHAN PAT MRN: [id number] Date of Birth: 03-Oct-1945  Today's Date: 12/02/2012 Time: 7829-5621 OT Time Calculation (min): 43 min Manual Therapy 308-657 13' Therapeutic exercises 901-931 30' Visit#: 9 of 16  Re-eval: 12/11/12    Authorization: Medicare  Authorization Time Period: Before 17th visit   Authorization Visit#: 9 of 16  Subjective  S:  I ve been working it at home. Pain Assessment Currently in Pain?: No/denies Pain Score: 0-No pain  Precautions/Restrictions   progress as tolerated  Exercise/Treatments Supine Protraction: PROM;AROM;10 reps Horizontal ABduction: PROM;AROM;10 reps External Rotation: PROM;AROM;10 reps Internal Rotation: PROM;AROM;10 reps Flexion: PROM;AROM;10 reps;Limitations Flexion Limitations: with min pa from OTR/L from 70-110 ABduction: PROM;AROM;10 reps;Limitations ABduction Limitations: with min pa from OTRL from 70-110 Seated Protraction: AAROM;10 reps (vg amd tactile cues to depress shoulder) Horizontal ABduction: AAROM;10 reps;Limitations Horizontal ABduction Limitations: min pa from OTR/L  External Rotation: AAROM;10 reps Internal Rotation: AAROM;10 reps Flexion: AAROM;10 reps;Limitations Flexion Limitations: to 90 Abduction: AAROM;10 reps;Limitations ABduction Limitations: to 45 Pulleys Flexion: 2 minutes ABduction: 2 minutes Therapy Ball Flexion: 20 reps ABduction: 20 reps Right/Left: 5 reps ROM / Strengthening / Isometric Strengthening Wall Wash: 2' Proximal Shoulder Strengthening, Supine: attempted, unable without assistance       Manual Therapy Manual Therapy: Myofascial release Myofascial Release: MFR and manual stretching to left upper arm to decrease fascial restrictions and increase joint mobility in a pain free zone  Occupational Therapy Assessment and Plan OT Assessment and Plan Clinical Impression Statement: A:  Transitioned to AROM in supine, as PROM  was [facility], as was AAROM.  required min tactile pa from OTR/L with horizontal abduction, flexion, abduction, through 70-110 range.  OT Plan: P:  Complete AROM in supine without assistance.   Goals Short Term Goals Short Term Goal 1: Patient will be educated on HEP.  Short Term Goal 1 Progress: Met Short Term Goal 2: Patient will increase PROM to WNL in his left shoulder for increased independence with donning and doffing shirts.  Short Term Goal 2 Progress: Progressing toward goal Short Term Goal 3: Patient will have 4-/5 strength in his left shoulder in order to lift heavy items up and overhead into cabinets.  Short Term Goal 3 Progress: Progressing toward goal Short Term Goal 4: Patient will decrease pain to 3/10 when sleeping.  Short Term Goal 4 Progress: Met Short Term Goal 5: Patient will decrease fascial restrictions from max to mod in his left shoulder region for increased mobility with daily activities.  Short Term Goal 5 Progress: Met Long Term Goals Long Term Goal 1: Patient will return to highest level of indepedence with all daily and leisure activities.  Long Term Goal 1 Progress: Progressing toward goal Long Term Goal 2: Patient will have 5/5 strength in his left shoulder in order to lift and carry car parts/accessories when needed.  Long Term Goal 2 Progress: Progressing toward goal Long Term Goal 3: Patient will decrease pain to 1/10 during daily activities.  Long Term Goal 3 Progress: Progressing toward goal Long Term Goal 4: Patient will decrease fascial restrictions from mod to min in his left shoulder region for increased mobility with daily activities.  Long Term Goal 4 Progress: Progressing toward goal  Problem List Patient Active Problem List   Diagnosis Date Noted  . Pain in joint, shoulder region 10/23/2012  . Muscle weakness (generalized) 10/23/2012  . S/P rotator cuff repair 10/23/2012  . Complete tear of rotator cuff 09/09/2012  . History  of radiation therapy    . Prostate cancer 12/18/2010    End of Session Activity Tolerance: Patient tolerated treatment well General Behavior During Therapy: [facility] for tasks assessed/performed  GO    Shirlean Mylar, OTR/L  12/02/2012, 9:34 AM

## 2012-12-04 ENCOUNTER — Ambulatory Visit (HOSPITAL_COMMUNITY)
Admission: RE | Admit: 2012-12-04 | Discharge: 2012-12-04 | Disposition: A | Discharge status: Home / Self Care | Payer: Medicare Other | Source: Ambulatory Visit | Attending: Family Medicine | Admitting: Family Medicine

## 2012-12-04 NOTE — Progress Notes (Signed)
Occupational Therapy Treatment Patient Details  Name: James Dyer MRN: [id number] Date of Birth: November 17, 1945  Today's Date: 12/04/2012 Time: 8413-2440 OT Time Calculation (min): 39 min Manual Therapy 102-725 10' Therapeutic exercises 902-931 29' Visit#: 10 of 16  Re-eval: 12/11/12    Authorization: Medicare  Authorization Time Period: Before 17th visit   Authorization Visit#: 10 of 16  Subjective S:  Its been a bit sore, I did mow the yard this week.  Limitations: progress as tolerated Pain Assessment Currently in Pain?: Yes Pain Score: 4  Pain Location: Shoulder Pain Orientation: Left Pain Type: Acute pain  Precautions/Restrictions   progress as toelrated  Exercise/Treatments Supine Protraction: PROM;AROM;10 reps Horizontal ABduction: PROM;AROM;10 reps External Rotation: PROM;AROM;10 reps Internal Rotation: PROM;AROM;10 reps Flexion: PROM;AROM;10 reps;Limitations Flexion Limitations: with min pa from OTR/L from 70-110 ABduction: PROM;AROM;10 reps;Limitations ABduction Limitations: with min pa from OTRL from 70-110 Seated Protraction: AAROM;10 reps Horizontal ABduction: AAROM;10 reps;Limitations Horizontal ABduction Limitations: min pa from OTR/L  External Rotation: AAROM;10 reps Internal Rotation: AAROM;10 reps Flexion: AAROM;10 reps;Limitations Flexion Limitations: to 90 Abduction: AAROM;10 reps;Limitations ABduction Limitations: to 45 Standing External Rotation: Theraband;15 reps Theraband Level (Shoulder External Rotation): Level 2 (Red) Internal Rotation: Theraband;15 reps Theraband Level (Shoulder Internal Rotation): Level 2 (Red) Extension: Theraband;15 reps Theraband Level (Shoulder Extension): Level 2 (Red) Row: Theraband;15 reps Theraband Level (Shoulder Row): Level 2 (Red) Therapy Ball Right/Left: 5 reps ROM / Strengthening / Isometric Strengthening UBE (Upper Arm Bike): 2' forward and 2' in reverse Wall Wash: 2.5'  Ball on Wall: 1' flexed  to 90 and 1' abducted to 90      Manual Therapy Manual Therapy: Myofascial release Myofascial Release: MFR and manual stretching to left upper arm to decrease fascial restrictions and increase joint mobility in a pain free zone  Occupational Therapy Assessment and Plan OT Assessment and Plan Clinical Impression Statement: A:  less assistance required with AROM in supine and AAROM in seated.  Added UBE for shoulder AROM and strengthening. OT Plan: P:  Increase independence with ball on wall exercise, reassess.   Goals Short Term Goals Short Term Goal 1: Patient will be educated on HEP.  Short Term Goal 2: Patient will increase PROM to WNL in his left shoulder for increased independence with donning and doffing shirts.  Short Term Goal 3: Patient will have 4-/5 strength in his left shoulder in order to lift heavy items up and overhead into cabinets.  Short Term Goal 4: Patient will decrease pain to 3/10 when sleeping.  Short Term Goal 5: Patient will decrease fascial restrictions from max to mod in his left shoulder region for increased mobility with daily activities.  Long Term Goals Long Term Goal 1: Patient will return to highest level of indepedence with all daily and leisure activities.  Long Term Goal 2: Patient will have 5/5 strength in his left shoulder in order to lift and carry car parts/accessories when needed.  Long Term Goal 3: Patient will decrease pain to 1/10 during daily activities.  Long Term Goal 4: Patient will decrease fascial restrictions from mod to min in his left shoulder region for increased mobility with daily activities.   Problem List Patient Active Problem List   Diagnosis Date Noted  . Pain in joint, shoulder region 10/23/2012  . Muscle weakness (generalized) 10/23/2012  . S/P rotator cuff repair 10/23/2012  . Complete tear of rotator cuff 09/09/2012  . History of radiation therapy   . Prostate cancer 12/18/2010    End of Session  Activity Tolerance:  Patient tolerated treatment well General Behavior During Therapy: [facility] for tasks assessed/performed  GO    Shirlean Mylar, OTR/L  12/04/2012, 9:42 AM

## 2012-12-09 ENCOUNTER — Ambulatory Visit (HOSPITAL_COMMUNITY)
Admission: RE | Admit: 2012-12-09 | Discharge: 2012-12-09 | Disposition: A | Discharge status: Home / Self Care | Payer: Medicare Other | Source: Ambulatory Visit | Attending: Family Medicine | Admitting: Family Medicine

## 2012-12-09 NOTE — Progress Notes (Signed)
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 Pain Assessment Currently in Pain?: Yes Pain Score: 3  Pain Location: Shoulder Pain Orientation: Left Multiple Pain Sites: No     Exercise/Treatments Supine Protraction: PROM;AROM;10 reps Horizontal ABduction: PROM;AROM;10 reps External Rotation: PROM;AROM;10 reps Internal Rotation: PROM;AROM;10 reps Flexion: PROM;AROM;10 reps;Limitations Flexion Limitations: with min pa from OTR/L from 70-110 ABduction: PROM;AROM;10 reps;Limitations ABduction Limitations: with min pa from OTRL from 70-110 Seated Protraction: AAROM;10 reps Horizontal ABduction: AAROM;10 reps;Limitations Horizontal ABduction Limitations: min pa from OTR/L  External Rotation: AAROM;10 reps Internal Rotation: AAROM;10 reps Flexion: AAROM;10 reps;Limitations Flexion Limitations: to 90 Abduction: AAROM;10 reps;Limitations ABduction Limitations: to 45 Standing External Rotation: Theraband;15 reps Theraband Level (Shoulder External Rotation): Level 2 (Red) Internal Rotation: Theraband;15 reps Theraband Level (Shoulder Internal Rotation): Level 2 (Red) Extension: Theraband;15 reps Theraband Level (Shoulder Extension): Level 2 (Red) Row: Theraband;15 reps Theraband Level (Shoulder Row): Level 2 (Red) Therapy Ball Right/Left: 5 reps ROM / Strengthening / Isometric Strengthening UBE (Upper Arm Bike): 2' forward and 2' in reverse (1.0 level ) Wall Wash: 2.5'      Manual Therapy Manual Therapy:  Myofascial release Myofascial Release: MFR and manual stretching to left upper arm to decrease fascial restrictions and increase joint mobility in a pain free zone Weight Bearing Technique Weight Bearing Technique: No  Occupational Therapy Assessment and Plan OT Assessment and Plan Clinical Impression Statement: A: Patient reports increasing functional use in daily tasks/activities however continue with compaint of continued weakness and fatigue .  Patient demos improving active range of motion and decrease pain with increased activities    Goals Short Term Goals Short Term Goal 1: Patient will be educated on HEP.  Short Term Goal 1 Progress: Met Short Term Goal 2: Patient will increase PROM to WNL in his left shoulder for increased independence with donning and doffing shirts.  Short Term Goal 2 Progress: Progressing toward goal Short Term Goal 3: Patient will have 4-/5 strength in his left shoulder in order to lift heavy items up and overhead into cabinets.  Short Term Goal 3 Progress: Progressing toward goal Short Term Goal 4: Patient will decrease pain to 3/10 when sleeping.  Short Term Goal 4 Progress: Met Short Term Goal 5: Patient will decrease fascial restrictions from max to mod in his left shoulder region for increased mobility with daily activities.  Short Term Goal 5 Progress: Met Long Term Goals Long Term Goal 1: Patient will return to highest level of indepedence with all daily and leisure activities.  Long Term Goal 1 Progress: Progressing toward goal Long Term Goal 2: Patient will have 5/5 strength in his left shoulder in order to lift and carry car parts/accessories when needed.  Long Term Goal 2 Progress: Progressing toward goal Long Term Goal 3: Patient will decrease pain to 1/10 during daily activities.  Long Term Goal 3 Progress: Progressing toward goal Long Term Goal 4: Patient will decrease fascial restrictions from mod to min in his left shoulder region for  increased mobility with daily activities.  Long Term Goal 4 Progress: Progressing toward  goal  Problem List Patient Active Problem List   Diagnosis Date Noted  . Pain in joint, shoulder region 10/23/2012  . Muscle weakness (generalized) 10/23/2012  . S/P rotator cuff repair 10/23/2012  . Complete tear of rotator cuff 09/09/2012  . History of radiation therapy   . Prostate cancer 12/18/2010    End of Session Activity Tolerance: Patient tolerated treatment well General Behavior During Therapy: [facility] for tasks assessed/performed OT Plan of Care OT Patient Instructions: P:  Reassess;  Increased strengthening activities and incorporate more sidelying exercises.    Velora Mediate, OTR.L  12/09/2012, 10:50 AM

## 2012-12-11 ENCOUNTER — Ambulatory Visit (HOSPITAL_COMMUNITY)
Admission: RE | Admit: 2012-12-11 | Discharge: 2012-12-11 | Disposition: A | Discharge status: Home / Self Care | Payer: Medicare Other | Source: Ambulatory Visit | Attending: Family Medicine | Admitting: Family Medicine

## 2012-12-11 NOTE — Progress Notes (Signed)
Occupational Therapy Treatment Patient Details  Name: James Dyer MRN: [id number] Date of Birth: Aug 17, 1945  Today's Date: 12/11/2012 Time: 4098-1191 OT Time Calculation (min): 48 min Manual 1015-1035 (20') TherExercises 1035-1103 (28')  Visit#: 14 of 16  Re-eval: 12/11/12    Authorization: Medicare  Authorization Time Period: Before 17th visit   Authorization Visit#: 14 of 16  Subjective Symptoms/Limitations Symptoms: S:   I used the mower to cut up leaves yesterday and it was a little more sore than usual from all the jarring.   Pain Assessment Currently in Pain?: Yes Pain Score: 2  Pain Location: Shoulder Pain Orientation: Left Pain Type: Acute pain Multiple Pain Sites: No      Exercise/Treatments Supine Protraction: PROM;AROM;10 reps Horizontal ABduction: PROM;AROM;10 reps External Rotation: PROM;AROM;10 reps Internal Rotation: PROM;AROM;10 reps Flexion: PROM;AROM;10 reps;Limitations Flexion Limitations: with min pa from OTR/L from 70-110 ABduction: PROM;AROM;10 reps;Limitations ABduction Limitations: with min pa from OTRL from 70-110 Seated Protraction: AAROM;10 reps External Rotation: AAROM;10 reps Internal Rotation: AAROM;10 reps Flexion: AAROM;10 reps;Limitations Flexion Limitations: to 90 Abduction: AAROM;10 reps;Limitations ABduction Limitations: >45    Standing External Rotation: Theraband;15 reps Theraband Level (Shoulder External Rotation): Level 2 (Red) Internal Rotation: Theraband;15 reps Theraband Level (Shoulder Internal Rotation): Level 2 (Red) Extension: Theraband;15 reps Theraband Level (Shoulder Extension): Level 2 (Red) Row: Theraband;15 reps Theraband Level (Shoulder Row): Level 2 (Red) ROM / Strengthening / Isometric Strengthening UBE (Upper Arm Bike): 3' forward, 3' backward (1.5 level ) Pushups: 10 reps (1.5 feet from wall hands placed shoulder height. ) Proximal Shoulder Strengthening, Supine: 10x with  pa from  therapist Ball on Wall: 1.5' flexed to 90 and 1' abducted to 90           Manual Therapy Manual Therapy: Myofascial release Myofascial Release: MFR and manual stretching to left upper arm to decrease fascial restrictions and increase joint mobility in a pain free zone Weight Bearing Technique Weight Bearing Technique: No  Occupational Therapy Assessment and Plan OT Assessment and Plan Clinical Impression Statement: A:  Patient with complaint of increased tightness and soreness in shoulder girdle this date.  Added wall pushups this date with good tolerance at minimal distance from wall.     Goals Short Term Goals Short Term Goal 1: Patient will be educated on HEP.  Short Term Goal 1 Progress: Met Short Term Goal 2: Patient will increase PROM to WNL in his left shoulder for increased independence with donning and doffing shirts.  Short Term Goal 2 Progress: Progressing toward goal Short Term Goal 3: Patient will have 4-/5 strength in his left shoulder in order to lift heavy items up and overhead into cabinets.  Short Term Goal 3 Progress: Progressing toward goal Short Term Goal 4: Patient will decrease pain to 3/10 when sleeping.  Short Term Goal 4 Progress: Met Short Term Goal 5: Patient will decrease fascial restrictions from max to mod in his left shoulder region for increased mobility with daily activities.  Short Term Goal 5 Progress: Met Long Term Goals Long Term Goal 1: Patient will return to highest level of indepedence with all daily and leisure activities.  Long Term Goal 1 Progress: Progressing toward goal Long Term Goal 2: Patient will have 5/5 strength in his left shoulder in order to lift and carry car parts/accessories when needed.  Long Term Goal 2 Progress: Progressing toward goal Long Term Goal 3: Patient will decrease pain to 1/10 during daily activities.  Long Term Goal 3 Progress: Progressing toward goal Long Term  Goal 4: Patient will decrease fascial  restrictions from mod to min in his left shoulder region for increased mobility with daily activities.  Long Term Goal 4 Progress: Progressing toward goal  Problem List Patient Active Problem List   Diagnosis Date Noted  . Pain in joint, shoulder region 10/23/2012  . Muscle weakness (generalized) 10/23/2012  . S/P rotator cuff repair 10/23/2012  . Complete tear of rotator cuff 09/09/2012  . History of radiation therapy   . Prostate cancer 12/18/2010    End of Session Activity Tolerance: Patient tolerated treatment well General Behavior During Therapy: [facility] for tasks assessed/performed OT Plan of Care OT Patient Instructions: P:  Reassess;  Increased strengthening activities and incorporate more sidelying exercises.   GO    Velora Mediate, OTR/L  12/11/2012, 12:59 PM

## 2012-12-16 ENCOUNTER — Ambulatory Visit (HOSPITAL_COMMUNITY)
Admission: RE | Admit: 2012-12-16 | Discharge: 2012-12-16 | Disposition: A | Discharge status: Home / Self Care | Payer: Medicare Other | Source: Ambulatory Visit | Attending: Family Medicine | Admitting: Family Medicine

## 2012-12-16 NOTE — Evaluation (Signed)
Occupational Therapy Re-Evaluation  Patient Details  Name: James Dyer MRN: [id number] Date of Birth: February 05, 1945  Today's Date: 12/16/2012 Time: 7829-5621 OT Time Calculation (min): 50 min Manual Therapy 845-858 13' Reassessment 858-908 10' Therapeutic exercises [phone] 27' Visit#: 15 of 24  Re-eval: 01/13/13  Assessment Diagnosis: s/p Left rotator cuff repair  Authorization: Medicare  Authorization Time Period: before 25th visit  Authorization Visit#: 15 of 25   Past Medical History:  Past Medical History  Diagnosis Date  . Hypertension   . Hypercholesterolemia   . 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 Borderline diabetes   . Cancer 2013    prostate   . Hepatitis     "pt told had been exposed to hepatitis when he went to donate blood"  . Nephrolithiasis   . 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 Foot surgery Right 1980's    calcium deposit removed  . Colonoscopy N/A 07/22/2012    Procedure: COLONOSCOPY;  Surgeon: Dalia Heading, MD;  Location: [facility];  Service: Gastroenterology;  Laterality: N/A;  . Prostate biopsy  2013  . Shoulder open rotator cuff repair Left 09/09/2012    Procedure: LEFT ROTATOR CUFF REPAIR SHOULDER OPEN;  Surgeon: Jacki Cones, MD;  Location: [facility];  Service: Orthopedics;  Laterality: Left;    Subjective  S:  I dont think Im ready to lift anything heavy yet. Special Tests: FOTO was 55.7 2 weeks ago, and is currently 55.7 Pain Assessment Currently in Pain?: Yes Pain Score: 2  Pain Location: Shoulder Pain Orientation: Left Pain Type: Acute pain  Precautions/Restrictions   progress as tolerated  Assessment Additional Assessments LUE AROM (degrees) LUE Overall AROM Comments: assessed in seated ER/IR with shoulder adducted (11/27/12 assessed in  supine) Left Shoulder Flexion: 100 Degrees (120) Left Shoulder ABduction: 86 Degrees (88) Left Shoulder Internal Rotation: 75 Degrees (68) Left Shoulder External Rotation: 70 Degrees (78) LUE PROM (degrees) LUE Overall PROM Comments: supine PROM is WFL LUE Strength LUE Overall Strength Comments: assessed in seated  Left Shoulder Flexion: 3-/5 Left Shoulder ABduction: 3-/5 Left Shoulder Internal Rotation: 3/5 Left Shoulder External Rotation: 3/5     Exercise/Treatments Supine Protraction: PROM;10 reps Horizontal ABduction: PROM;10 reps External Rotation: PROM;10 reps Internal Rotation: PROM;10 reps Flexion: PROM;10 reps ABduction: PROM;10 reps Seated Protraction: AAROM;15 reps Horizontal ABduction: AAROM;15 reps External Rotation: AAROM;15 reps Internal Rotation: AAROM;15 reps Flexion: AAROM;15 reps Flexion Limitations: able to complete full range this date Abduction: AAROM;15 reps ABduction Limitations: to 100 Standing External Rotation: Theraband;15 reps Theraband Level (Shoulder External Rotation): Level 2 (Red) Internal Rotation: Theraband;15 reps Theraband Level (Shoulder Internal Rotation): Level 2 (Red) Extension: Theraband;15 reps Theraband Level (Shoulder Extension): Level 2 (Red) Row: Theraband;15 reps Theraband Level (Shoulder Row): Level 2 (Red) ROM / Strengthening / Isometric Strengthening UBE (Upper Arm Bike): 3' forward and 3' reverse 2.0 Wall Wash: 2' Ball on Wall: 1' with OTR/L supporting weight of arm       Manual Therapy Manual Therapy: Myofascial release Myofascial Release: MFR and manual stretching to left upper arm to decrease fascial restrictions and increase joint mobility in a pain free zone  Occupational Therapy Assessment and Plan OT Assessment and Plan Clinical Impression Statement: A:  MD progress note completed this date.  Patient has full PROM in supine  and AROM is approximately 50% in seated.  Patient has met 3/5 short term goals.   Patient has increased independence with reaching to shoulder height.  He continues to have difficulty reaching overhead, out to his side, and lifting heavier items.   OT Frequency: Min 2X/week OT Duration: 4 weeks OT Plan: P:  Focus on scapular stability in order to improve AROM and strength with activities above shoudler height.   Goals Short Term Goals Short Term Goal 1: Patient will be educated on HEP.  Short Term Goal 1 Progress: Met Short Term Goal 2: Patient will increase PROM to WNL in his left shoulder for increased independence with donning and doffing shirts.  Short Term Goal 2 Progress: Met Short Term Goal 3: Patient will have 4-/5 strength in his left shoulder in order to lift heavy items up and overhead into cabinets.  Short Term Goal 3 Progress: Progressing toward goal Short Term Goal 4: Patient will decrease pain to 3/10 when sleeping.  Short Term Goal 4 Progress: Met Short Term Goal 5: Patient will decrease fascial restrictions from max to mod in his left shoulder region for increased mobility with daily activities.  Short Term Goal 5 Progress: Met Long Term Goals Long Term Goal 1: Patient will return to highest level of indepedence with all daily and leisure activities.  Long Term Goal 1 Progress: Progressing toward goal Long Term Goal 2: Patient will have 5/5 strength in his left shoulder in order to lift and carry car parts/accessories when needed.  Long Term Goal 2 Progress: Progressing toward goal Long Term Goal 3: Patient will decrease pain to 1/10 during daily activities.  Long Term Goal 3 Progress: Progressing toward goal Long Term Goal 4: Patient will decrease fascial restrictions from mod to min in his left shoulder region for increased mobility with daily activities.  Long Term Goal 4 Progress: Progressing toward goal  Problem List Patient Active Problem List   Diagnosis Date Noted  . Pain in joint, shoulder region 10/23/2012  . Muscle weakness (generalized)  10/23/2012  . S/P rotator cuff repair 10/23/2012  . Complete tear of rotator cuff 09/09/2012  . History of radiation therapy   . Prostate cancer 12/18/2010    End of Session Activity Tolerance: Patient tolerated treatment well General Behavior During Therapy: [facility] for tasks assessed/performed OT Plan of Care OT Home Exercise Plan: Educated patient on scapular stability with red tband.  GO Functional Assessment Tool Used: FOTO score was 56 and is currently 55.7 Functional Limitation: Carrying, moving and handling objects Carrying, Moving and Handling Objects Current Status (O1308): At least 20 percent but less than 40 percent impaired, limited or restricted Carrying, Moving and Handling Objects Goal Status [phone]): At least 1 percent but less than 20 percent impaired, limited or restricted  Shirlean Mylar, OTR/L  12/16/2012, 9:40 AM  Physician Documentation Your signature is required to indicate approval of the treatment plan as stated above.  Please sign and either send electronically or make a copy of this report for your files and return this physician signed original.  Please mark one 1.__approve of plan  2. ___approve of plan with the following conditions.   ______________________________  _____________________ Physician Signature                                                                                                             Date

## 2012-12-18 ENCOUNTER — Ambulatory Visit (HOSPITAL_COMMUNITY)
Admission: RE | Admit: 2012-12-18 | Discharge: 2012-12-18 | Disposition: A | Discharge status: Home / Self Care | Payer: Medicare Other | Source: Ambulatory Visit | Attending: Family Medicine | Admitting: Family Medicine

## 2012-12-18 NOTE — Progress Notes (Signed)
Occupational Therapy Treatment Patient Details  Name: James Dyer MRN: [id number] Date of Birth: 01-Feb-1945  Today's Date: 12/18/2012 Time: 4098-1191 OT Time Calculation (min): 45 min Manual 4782-9562 (18') TherExercise 1308-6578 (27')  Visit#: 16 of 24  Re-eval: 01/13/13    Authorization: Medicare  Authorization Time Period: before 25th visit  Authorization Visit#: 16 of 25  Subjective Symptoms/Limitations Symptoms: S:  I went to the doctor and she said to continue... Im doing pretty good.  Pain Assessment Currently in Pain?: Yes Pain Score: 2  Pain Location: Shoulder Pain Orientation: Left Pain Type: Acute pain Multiple Pain Sites: No  Exercise/Treatments Supine Protraction: PROM;AAROM;10 reps Horizontal ABduction: PROM;AAROM;10 reps External Rotation: PROM;AAROM;10 reps Internal Rotation: PROM;AAROM;10 reps Flexion: PROM;AAROM;10 reps ABduction: PROM;AAROM;10 reps Seated Protraction: AAROM;15 reps Horizontal ABduction: AAROM;15 reps Horizontal ABduction Limitations: min pa from OTR this date  External Rotation: AAROM;15 reps Internal Rotation: AAROM;15 reps Flexion: AAROM;15 reps Flexion Limitations: full range this date. Abduction: AAROM;15 reps ABduction Limitations: to 100 Standing External Rotation: Theraband;15 reps Theraband Level (Shoulder External Rotation): Level 2 (Red) Internal Rotation: Theraband;15 reps Theraband Level (Shoulder Internal Rotation): Level 2 (Red) Extension: Theraband;15 reps Theraband Level (Shoulder Extension): Level 2 (Red) Row: Theraband;15 reps Theraband Level (Shoulder Row): Level 2 (Red)   ROM / Strengthening / Isometric Strengthening UBE (Upper Arm Bike): 3' forward and 3' reverse 2.0 Wall Wash: 2' Ball on Wall: 1' Prot/Ret//Elev/Dep: 1'       Manual Therapy Manual Therapy: Myofascial release Myofascial Release: MFR and manual stretching to left upper arm to decrease fascial restrictions and increase joint  mobility in a pain free zone Weight Bearing Technique Weight Bearing Technique: No  Occupational Therapy Assessment and Plan OT Assessment and Plan Clinical Impression Statement: A: Patient with complaint of sharp pain prior to therapy across upper trap however resolved before coming.  Patient cont to require physical assistance  for flexion and abduction in seated position.  improving strength for shoulder level activities  OT Plan: P:  Focus on scapular stability in order to improve AROM and strength with activities above shoudler height.   Goals Short Term Goals Short Term Goal 1: Patient will be educated on HEP.  Short Term Goal 1 Progress: Met Short Term Goal 2: Patient will increase PROM to WNL in his left shoulder for increased independence with donning and doffing shirts.  Short Term Goal 2 Progress: Met Short Term Goal 3: Patient will have 4-/5 strength in his left shoulder in order to lift heavy items up and overhead into cabinets.  Short Term Goal 3 Progress: Progressing toward goal Short Term Goal 4: Patient will decrease pain to 3/10 when sleeping.  Short Term Goal 4 Progress: Met Short Term Goal 5: Patient will decrease fascial restrictions from max to mod in his left shoulder region for increased mobility with daily activities.  Short Term Goal 5 Progress: Met Long Term Goals Long Term Goal 1: Patient will return to highest level of indepedence with all daily and leisure activities.  Long Term Goal 1 Progress: Progressing toward goal Long Term Goal 2: Patient will have 5/5 strength in his left shoulder in order to lift and carry car parts/accessories when needed.  Long Term Goal 2 Progress: Progressing toward goal Long Term Goal 3: Patient will decrease pain to 1/10 during daily activities.  Long Term Goal 3 Progress: Progressing toward goal Long Term Goal 4: Patient will decrease fascial restrictions from mod to min in his left shoulder region for increased mobility with  daily activities.  Long Term Goal 4 Progress: Progressing toward goal  Problem List Patient Active Problem List   Diagnosis Date Noted  . Pain in joint, shoulder region 10/23/2012  . Muscle weakness (generalized) 10/23/2012  . S/P rotator cuff repair 10/23/2012  . Complete tear of rotator cuff 09/09/2012  . History of radiation therapy   . Prostate cancer 12/18/2010    End of Session Activity Tolerance: Patient tolerated treatment well General Behavior During Therapy: [facility] for tasks assessed/performed  GO    Farrie Sann,OTR/L  12/18/2012, 9:23 AM

## 2012-12-23 ENCOUNTER — Ambulatory Visit (HOSPITAL_COMMUNITY)
Admission: RE | Admit: 2012-12-23 | Discharge: 2012-12-23 | Disposition: A | Discharge status: Home / Self Care | Payer: Medicare Other | Source: Ambulatory Visit | Attending: Family Medicine | Admitting: Family Medicine

## 2012-12-23 NOTE — Progress Notes (Signed)
Occupational Therapy Treatment Patient Details  Name: James Dyer MRN: [id number] Date of Birth: 04/24/1945  Today's Date: 12/23/2012 Time: 3086-5784 OT Time Calculation (min): 50 min Manual therapy 696-295 18' Therapeutic exercises 905-937 32' Visit#: 17 of 24  Re-eval: 01/13/13    Authorization: Medicare  Authorization Time Period: before 25th visit  Authorization Visit#: 17 of 25  Subjective Symptoms/Limitations Symptoms: S:  It feels pretty good so far. Limitations: progress as tolerated Pain Assessment Currently in Pain?: No/denies Pain Score: 0-No pain  Precautions/Restrictions     Exercise/Treatments Supine Protraction: PROM;10 reps;AROM;15 reps Horizontal ABduction: 10 reps;PROM;AROM;15 reps External Rotation: PROM;10 reps;AROM;15 reps Internal Rotation: PROM;10 reps;AROM;15 reps Flexion: PROM;10 reps;AROM;15 reps ABduction: PROM;10 reps;AROM;15 reps Seated Protraction: AROM;10 reps Horizontal ABduction: AROM;10 reps External Rotation: AROM;15 reps Internal Rotation: AROM;15 reps Flexion: AROM;15 reps Abduction: AROM;15 reps ABduction Limitations: with assistance from therapist for correct form and unweighting of arm.  ROM / Strengthening / Isometric Strengthening UBE (Upper Arm Bike): 3' forward and 3' reverse 2.0 Wall Wash: 3'      Manual Therapy Manual Therapy: Myofascial release Myofascial Release: MFR and manual stretching to left upper arm to decrease fascial restrictions and increase joint mobility in a pain free zone  Occupational Therapy Assessment and Plan OT Assessment and Plan Clinical Impression Statement: A: Patient completed AROM in supine and seated, required facilitation with seated AROM. OT Plan: P:  Complete seated AROM without facilitation from OTR/L   Goals Short Term Goals Short Term Goal 1: Patient will be educated on HEP.  Short Term Goal 2: Patient will increase PROM to WNL in his left shoulder for increased  independence with donning and doffing shirts.  Short Term Goal 3: Patient will have 4-/5 strength in his left shoulder in order to lift heavy items up and overhead into cabinets.  Short Term Goal 4: Patient will decrease pain to 3/10 when sleeping.  Short Term Goal 5: Patient will decrease fascial restrictions from max to mod in his left shoulder region for increased mobility with daily activities.  Long Term Goals Long Term Goal 1: Patient will return to highest level of indepedence with all daily and leisure activities.  Long Term Goal 2: Patient will have 5/5 strength in his left shoulder in order to lift and carry car parts/accessories when needed.  Long Term Goal 3: Patient will decrease pain to 1/10 during daily activities.  Long Term Goal 4: Patient will decrease fascial restrictions from mod to min in his left shoulder region for increased mobility with daily activities.   Problem List Patient Active Problem List   Diagnosis Date Noted  . Pain in joint, shoulder region 10/23/2012  . Muscle weakness (generalized) 10/23/2012  . S/P rotator cuff repair 10/23/2012  . Complete tear of rotator cuff 09/09/2012  . History of radiation therapy   . Prostate cancer 12/18/2010    End of Session Activity Tolerance: Patient tolerated treatment well General Behavior During Therapy: [facility] for tasks assessed/performed  GO    Shirlean Mylar, OTR/L  12/23/2012, 9:35 AM

## 2012-12-30 ENCOUNTER — Ambulatory Visit (HOSPITAL_COMMUNITY)
Admission: RE | Admit: 2012-12-30 | Discharge: 2012-12-30 | Disposition: A | Discharge status: Home / Self Care | Payer: Medicare Other | Source: Ambulatory Visit | Attending: Family Medicine | Admitting: Family Medicine

## 2012-12-30 DIAGNOSIS — IMO0001 Reserved for inherently not codable concepts without codable children: Secondary | ICD-10-CM | POA: Diagnosis not present

## 2012-12-30 DIAGNOSIS — I10 Essential (primary) hypertension: Secondary | ICD-10-CM | POA: Insufficient documentation

## 2012-12-30 DIAGNOSIS — M25519 Pain in unspecified shoulder: Secondary | ICD-10-CM | POA: Diagnosis not present

## 2012-12-30 DIAGNOSIS — M6281 Muscle weakness (generalized): Secondary | ICD-10-CM | POA: Insufficient documentation

## 2012-12-30 DIAGNOSIS — M25619 Stiffness of unspecified shoulder, not elsewhere classified: Secondary | ICD-10-CM | POA: Diagnosis not present

## 2012-12-30 NOTE — Progress Notes (Signed)
Occupational Therapy Treatment Patient Details  Name: James Dyer MRN: [id number] Date of Birth: 08/28/1945  Today's Date: 12/30/2012 Time: 4098-1191 OT Time Calculation (min): 42 min Manual therapy 850-902 12' Therapeutic exercises 902-932 30' Visit#: 18 of 24  Re-eval: 01/13/13    Authorization: Medicare  Authorization Time Period: before 25th visit  Authorization Visit#: 18 of 25  Subjective Symptoms/Limitations Symptoms: S:  I was working in my building - I bet my shoulder will be sore later.  Pain Assessment Currently in Pain?: Yes Pain Score: 1  Pain Location: Shoulder Pain Orientation: Left Pain Type: Acute pain  Precautions/Restrictions   progress as tolerated  Exercise/Treatments Supine Protraction: PROM;10 reps;AROM;15 reps Horizontal ABduction: 10 reps;PROM;AROM;15 reps External Rotation: PROM;10 reps;AROM;15 reps Internal Rotation: PROM;10 reps;AROM;15 reps Flexion: PROM;10 reps;AROM;15 reps ABduction: PROM;10 reps;AROM;15 reps Seated Protraction: AROM;10 reps Horizontal ABduction: AROM;10 reps Horizontal ABduction Limitations: min pa from OTR this date  External Rotation: AROM;15 reps Internal Rotation: AROM;15 reps Flexion: AROM;15 reps Flexion Limitations: to 90 Abduction: AROM;15 reps ABduction Limitations: with assistance from therapist for correct form and unweighting of arm.  Standing External Rotation: Theraband;15 reps Theraband Level (Shoulder External Rotation): Level 3 (Green) Internal Rotation: Theraband;15 reps Theraband Level (Shoulder Internal Rotation): Level 3 (Green) Extension: Theraband;15 reps Theraband Level (Shoulder Extension): Level 3 (Green) Row: Theraband;15 reps Theraband Level (Shoulder Row): Level 3 (Green) ROM / Strengthening / Isometric Strengthening UBE (Upper Arm Bike): 3' forward and 3' reverse 3.0 [employer]bex Press: 1.5 plate;15 reps Cybex Row: 1.5 plate;15 reps Wall Wash: 3' Ball on Wall: 1' with arm flexed  to 90 and 1' with arm abducted to 90       Manual Therapy Manual Therapy: Myofascial release Myofascial Release: MFR and manual stretching to left upper arm to decrease fascial restrictions and increase joint mobility in a pain free zone  Occupational Therapy Assessment and Plan OT Assessment and Plan Clinical Impression Statement: A:  AROM in supine is WFL.  Seated AROM for flexion and abduction limited to 90 degrees, cuing required to depress shoulder blade with horizontal abduction.  OT Plan: P:  Add chain lace for sustained overhead activity tolerance. Add walk fingers up wall and then remove hand from wall   Goals Short Term Goals Short Term Goal 1: Patient will be educated on HEP.  Short Term Goal 2: Patient will increase PROM to WNL in his left shoulder for increased independence with donning and doffing shirts.  Short Term Goal 3: Patient will have 4-/5 strength in his left shoulder in order to lift heavy items up and overhead into cabinets.  Short Term Goal 3 Progress: Progressing toward goal Short Term Goal 4: Patient will decrease pain to 3/10 when sleeping.  Short Term Goal 5: Patient will decrease fascial restrictions from max to mod in his left shoulder region for increased mobility with daily activities.  Long Term Goals Long Term Goal 1: Patient will return to highest level of indepedence with all daily and leisure activities.  Long Term Goal 1 Progress: Progressing toward goal Long Term Goal 2: Patient will have 5/5 strength in his left shoulder in order to lift and carry car parts/accessories when needed.  Long Term Goal 2 Progress: Progressing toward goal Long Term Goal 3: Patient will decrease pain to 1/10 during daily activities.  Long Term Goal 3 Progress: Progressing toward goal Long Term Goal 4: Patient will decrease fascial restrictions from mod to min in his left shoulder region for increased mobility with daily activities.  Long Term Goal 4 Progress:  Progressing toward goal  Problem List Patient Active Problem List   Diagnosis Date Noted  . Pain in joint, shoulder region 10/23/2012  . Muscle weakness (generalized) 10/23/2012  . S/P rotator cuff repair 10/23/2012  . Complete tear of rotator cuff 09/09/2012  . History of radiation therapy   . Prostate cancer 12/18/2010    End of Session Activity Tolerance: Patient tolerated treatment well General Behavior During Therapy: [facility] for tasks assessed/performed  GO     12/30/2012, 9:32 AM

## 2013-01-01 ENCOUNTER — Ambulatory Visit (HOSPITAL_COMMUNITY)
Admission: RE | Admit: 2013-01-01 | Discharge: 2013-01-01 | Disposition: A | Discharge status: Home / Self Care | Payer: Medicare Other | Source: Ambulatory Visit | Attending: Family Medicine | Admitting: Family Medicine

## 2013-01-01 NOTE — Progress Notes (Signed)
Occupational Therapy Treatment Patient Details  Name: James Dyer MRN: [id number] Date of Birth: [birth year]  Today's Date: 01/01/2013 Time: 4696-2952 OT Time Calculation (min): 44 min Manual Therapy 840-853 13' Therapeutic Exercises [phone] 52' Visit#: 19 of 24  Re-eval: 01/13/13    Authorization: Medicare  Authorization Time Period: before 25th visit  Authorization Visit#: 19 of 25  Subjective S:  I think I should have warmed up my arm before doing the band exercises.  Pain Assessment Currently in Pain?: Yes Pain Score: 1  Pain Location: Shoulder Pain Orientation: Left Pain Type: Acute pain  Precautions/Restrictions   progress as tolerated  Exercise/Treatments Supine Protraction: PROM;10 reps;AROM;15 reps Horizontal ABduction: 10 reps;PROM;AROM;15 reps External Rotation: PROM;10 reps;AROM;15 reps Internal Rotation: PROM;10 reps;AROM;15 reps Flexion: PROM;10 reps;AROM;15 reps Flexion Limitations: with min pa from OTR/L from 70-110 ABduction: PROM;10 reps;AROM;15 reps ABduction Limitations: with min pa from OTRL from 70-110 Seated Protraction: AROM;10 reps Horizontal ABduction: AROM;15 reps Horizontal ABduction Limitations: min pa from OTR this date  External Rotation: AROM;15 reps Internal Rotation: AROM;15 reps Flexion: AROM;15 reps Flexion Limitations: to 90 Abduction: AROM;15 reps ABduction Limitations: with assistance from therapist for correct form and unweighting of arm on 50% of repetiitons  ROM / Strengthening / Isometric Strengthening UBE (Upper Arm Bike): 3' forward and 3' reverse 3.0 Cybex Press: 1.5 plate;15 reps Cybex Row: 1.5 plate;15 reps Over Head Lace: 3' seated Proximal Shoulder Strengthening, Supine: 10 X  Ball on Wall: 1' with arm flexed to 90 and 1' with arm abducted to 90 Other ROM/Strengthening Exercises: walk fingers up wall into full flexion, remove hand from wall and hold position in space for 5" X 7      Manual Therapy Manual  Therapy: Myofascial release Myofascial Release: MFR and manual stretching to left upper arm to decrease fascial restrictions and increase joint mobility in a pain free zone  Occupational Therapy Assessment and Plan OT Assessment and Plan Clinical Impression Statement: A:  Able to complete abduction AROM to 100 degrees in seated without assistance 50% of reps this date.  OT Plan: P:  Improve to 7" hold with walking fingers up wall.    Goals Short Term Goals Short Term Goal 1: Patient will be educated on HEP.  Short Term Goal 2: Patient will increase PROM to WNL in his left shoulder for increased independence with donning and doffing shirts.  Short Term Goal 3: Patient will have 4-/5 strength in his left shoulder in order to lift heavy items up and overhead into cabinets.  Short Term Goal 4: Patient will decrease pain to 3/10 when sleeping.  Short Term Goal 5: Patient will decrease fascial restrictions from max to mod in his left shoulder region for increased mobility with daily activities.  Long Term Goals Long Term Goal 1: Patient will return to highest level of indepedence with all daily and leisure activities.  Long Term Goal 2: Patient will have 5/5 strength in his left shoulder in order to lift and carry car parts/accessories when needed.  Long Term Goal 3: Patient will decrease pain to 1/10 during daily activities.  Long Term Goal 4: Patient will decrease fascial restrictions from mod to min in his left shoulder region for increased mobility with daily activities.   Problem List Patient Active Problem List   Diagnosis Date Noted  . Pain in joint, shoulder region 10/23/2012  . Muscle weakness (generalized) 10/23/2012  . S/P rotator cuff repair 10/23/2012  . Complete tear of rotator cuff 09/09/2012  . History  of radiation therapy   . Prostate cancer 12/18/2010    End of Session Activity Tolerance: Patient tolerated treatment well General Behavior During Therapy: [facility] for tasks  assessed/performed  GO    Shirlean Mylar, OTR/L  01/01/2013, 9:21 AM

## 2013-01-06 ENCOUNTER — Ambulatory Visit (INDEPENDENT_AMBULATORY_CARE_PROVIDER_SITE_OTHER): Payer: Medicare Other | Admitting: Urology

## 2013-01-06 ENCOUNTER — Ambulatory Visit (HOSPITAL_COMMUNITY)
Admission: RE | Admit: 2013-01-06 | Discharge: 2013-01-06 | Disposition: A | Discharge status: Home / Self Care | Payer: Medicare Other | Source: Ambulatory Visit | Attending: Family Medicine | Admitting: Family Medicine

## 2013-01-06 DIAGNOSIS — N400 Enlarged prostate without lower urinary tract symptoms: Secondary | ICD-10-CM | POA: Diagnosis not present

## 2013-01-06 DIAGNOSIS — N138 Other obstructive and reflux uropathy: Secondary | ICD-10-CM

## 2013-01-06 DIAGNOSIS — N401 Enlarged prostate with lower urinary tract symptoms: Secondary | ICD-10-CM

## 2013-01-06 DIAGNOSIS — C61 Malignant neoplasm of prostate: Secondary | ICD-10-CM | POA: Diagnosis not present

## 2013-01-06 DIAGNOSIS — N529 Male erectile dysfunction, unspecified: Secondary | ICD-10-CM

## 2013-01-06 NOTE — Progress Notes (Signed)
Occupational Therapy Treatment Patient Details  Name: James Dyer MRN: [id number] Date of Birth: [birth year]  Today's Date: 01/06/2013 Time: 4098-1191 OT Time Calculation (min): 44 min Manual [id number] (17') TherExercises (27')  Visit#: 20 of 24  Re-eval: 01/13/13    Authorization: Medicare  Authorization Time Period: before 25th visit  Authorization Visit#: 20 of 25  Subjective Symptoms/Limitations Symptoms: S:  The ladder fell out from under me on Saturday and it left me hanginig from the gutter with both hands  for a minute.   Pain Assessment Currently in Pain?: Yes Pain Score: 3  Pain Location: Shoulder Pain Orientation: Left Pain Type: Acute pain Multiple Pain Sites: No   Exercise/Treatments Supine Protraction: PROM;10 reps;AROM;15 reps Horizontal ABduction: 10 reps;PROM;AROM;15 reps External Rotation: PROM;10 reps;AROM;15 reps Internal Rotation: PROM;10 reps;AROM;15 reps Flexion: PROM;10 reps;AROM;15 reps Flexion Limitations: with min pa from OTR/L from 70-110 ABduction: PROM;10 reps;AROM;15 reps ABduction Limitations: with min pa from OTRL from 70-110 Seated Protraction: AROM;10 reps Horizontal ABduction: AROM;15 reps Horizontal ABduction Limitations: min pa from OTR this date  Flexion: AROM;15 reps Flexion Limitations: to 90 Abduction: AROM;15 reps ABduction Limitations: with assistance from therapist for correct form and unweighting of arm on 50% of repetiitons  Prone    Sidelying External Rotation: AAROM;12 reps Internal Rotation: AAROM;12 reps Flexion: AAROM;12 reps ABduction: AAROM;12 reps   ROM / Strengthening / Isometric Strengthening UBE (Upper Arm Bike): 3' forward and 3' reverse 3.0 Cybex Press: 2 plate;15 reps Cybex Row: 2 plate;15 reps Proximal Shoulder Strengthening, Supine: 1' Ball on Wall: 1' with arm flexed to 90 and 1' with arm abducted to 90 Other ROM/Strengthening Exercises: walk fingers up ladder holes into full flexion,  remove hand  and hold position in space for 7" X 6       Manual Therapy Manual Therapy: Myofascial release Myofascial Release: MFR and manual stretching to left upper arm to decrease fascial restrictions and increase joint mobility in a pain free zone Weight Bearing Technique Weight Bearing Technique: No  Occupational Therapy Assessment and Plan OT Assessment and Plan Clinical Impression Statement: A:   Increased complaint of soreness this date from hanging over weekend from rafter of home after falling off step ladder.  Demos increased ability to walk up ladder and hold suspended in space x increased time this date.  Complaint of increased fatigue following treatment.  OT Plan: P:  Improve to 7" hold with walking fingers up wall x 10 reps    Goals Short Term Goals Short Term Goal 1: Patient will be educated on HEP.  Short Term Goal 1 Progress: Met Short Term Goal 2: Patient will increase PROM to WNL in his left shoulder for increased independence with donning and doffing shirts.  Short Term Goal 2 Progress: Met Short Term Goal 3: Patient will have 4-/5 strength in his left shoulder in order to lift heavy items up and overhead into cabinets.  Short Term Goal 3 Progress: Progressing toward goal Short Term Goal 4: Patient will decrease pain to 3/10 when sleeping.  Short Term Goal 4 Progress: Met Short Term Goal 5: Patient will decrease fascial restrictions from max to mod in his left shoulder region for increased mobility with daily activities.  Short Term Goal 5 Progress: Met Long Term Goals Long Term Goal 1: Patient will return to highest level of indepedence with all daily and leisure activities.  Long Term Goal 1 Progress: Progressing toward goal Long Term Goal 2: Patient will have 5/5 strength in his left shoulder  in order to lift and carry car parts/accessories when needed.  Long Term Goal 2 Progress: Progressing toward goal Long Term Goal 3: Patient will decrease pain to 1/10  during daily activities.  Long Term Goal 3 Progress: Progressing toward goal Long Term Goal 4: Patient will decrease fascial restrictions from mod to min in his left shoulder region for increased mobility with daily activities.  Long Term Goal 4 Progress: Progressing toward goal  Problem List Patient Active Problem List   Diagnosis Date Noted  . Pain in joint, shoulder region 10/23/2012  . Muscle weakness (generalized) 10/23/2012  . S/P rotator cuff repair 10/23/2012  . Complete tear of rotator cuff 09/09/2012  . History of radiation therapy   . Prostate cancer 12/18/2010    End of Session Activity Tolerance: Patient tolerated treatment well General Behavior During Therapy: [facility] for tasks assessed/performed  GO    Velora Mediate, OTR/L 01/06/2013, 9:36 AM

## 2013-01-08 ENCOUNTER — Ambulatory Visit (HOSPITAL_COMMUNITY)
Admission: RE | Admit: 2013-01-08 | Discharge: 2013-01-08 | Disposition: A | Discharge status: Home / Self Care | Payer: Medicare Other | Source: Ambulatory Visit | Attending: Family Medicine | Admitting: Family Medicine

## 2013-01-08 NOTE — Progress Notes (Signed)
Occupational Therapy Treatment Patient Details  Name: James Dyer MRN: [id number] Date of Birth: Jul 18, 1945  Today's Date: 01/08/2013 Time: 4098-1191 OT Time Calculation (min): 42 min Manual therapy 851-900 8' Therapeutic exercises 900-933 90'  Visit#: 21 of 24  Re-eval: 01/13/13    Authorization: Medicare  Authorization Time Period: before 25th visit  Authorization Visit#: 21 of 25  Subjective S:  I feel much better now, its not sore anymore.  Pain Assessment Currently in Pain?: Yes Pain Score: 2  Pain Location: Shoulder Pain Orientation: Left Pain Type: Acute pain  Precautions/Restrictions   progress as tolerated   Exercise/Treatments Supine Protraction: PROM;10 reps;Strengthening;15 reps Protraction Weight (lbs): 1 Horizontal ABduction: PROM;10 reps;Strengthening;15 reps Horizontal ABduction Weight (lbs): 1 External Rotation: PROM;10 reps;Strengthening;15 reps External Rotation Weight (lbs): 1 Internal Rotation: PROM;10 reps;Strengthening;15 reps Internal Rotation Weight (lbs): 1 Flexion: PROM;10 reps;Strengthening;15 reps Shoulder Flexion Weight (lbs): 1 ABduction: PROM;10 reps;Strengthening;15 reps Shoulder ABduction Weight (lbs): 1 Seated Protraction: AROM;15 reps Horizontal ABduction: AROM;15 reps External Rotation: AROM;15 reps Internal Rotation: AROM;15 reps Flexion: AROM;15 reps Abduction: AROM;15 reps ROM / Strengthening / Isometric Strengthening UBE (Upper Arm Bike): 3' forward and 3' reverse 3.0 Cybex Press: 2 plate;15 reps Cybex Row: 2 plate;15 reps Over Head Lace: 3' seated "W" Arms: 10 X with OT eliminating weight of arm X to V Arms: 10 X with OTR/L eliminating weight of arm Proximal Shoulder Strengthening, Seated: 10 X each with facilitation from OTR/L Other ROM/Strengthening Exercises: walk fingers up ladder holes into full abduction and  flexion, remove hand  and hold position in space for 7" X 6      Manual Therapy Manual Therapy:  Myofascial release Myofascial Release: MFR and manual stretching to left upper arm to decrease fascial restrictions and increase joint mobility in a pain free zone  Occupational Therapy Assessment and Plan OT Assessment and Plan Clinical Impression Statement: A:  Able to complete strengthening in supine iwth  1#.  Placed in front of mirror for seated AROM to maintain depressed shoulder blade while completing AROM.   OT Plan: P:  Reassess for monthly progress note. Complete seated AROM with depressed shoulder blade with less tactlile and vg from OT.   Goals Short Term Goals Short Term Goal 1: Patient will be educated on HEP.  Short Term Goal 2: Patient will increase PROM to WNL in his left shoulder for increased independence with donning and doffing shirts.  Short Term Goal 3: Patient will have 4-/5 strength in his left shoulder in order to lift heavy items up and overhead into cabinets.  Short Term Goal 4: Patient will decrease pain to 3/10 when sleeping.  Short Term Goal 5: Patient will decrease fascial restrictions from max to mod in his left shoulder region for increased mobility with daily activities.  Long Term Goals Long Term Goal 1: Patient will return to highest level of indepedence with all daily and leisure activities.  Long Term Goal 2: Patient will have 5/5 strength in his left shoulder in order to lift and carry car parts/accessories when needed.  Long Term Goal 3: Patient will decrease pain to 1/10 during daily activities.  Long Term Goal 4: Patient will decrease fascial restrictions from mod to min in his left shoulder region for increased mobility with daily activities.   Problem List Patient Active Problem List   Diagnosis Date Noted  . Pain in joint, shoulder region 10/23/2012  . Muscle weakness (generalized) 10/23/2012  . S/P rotator cuff repair 10/23/2012  . Complete  tear of rotator cuff 09/09/2012  . History of radiation therapy   . Prostate cancer 12/18/2010     End of Session Activity Tolerance: Patient tolerated treatment well General Behavior During Therapy: [facility] for tasks assessed/performed  GO    Shirlean Mylar, OTR/L  01/08/2013, 9:29 AM

## 2013-01-13 ENCOUNTER — Ambulatory Visit (HOSPITAL_COMMUNITY)
Admission: RE | Admit: 2013-01-13 | Discharge: 2013-01-13 | Disposition: A | Discharge status: Home / Self Care | Payer: Medicare Other | Source: Ambulatory Visit | Attending: Family Medicine | Admitting: Family Medicine

## 2013-01-13 NOTE — Progress Notes (Signed)
Occupational Therapy Treatment Patient Details  Name: James Dyer MRN: [id number] Date of Birth: 04/23/1945  Today's Date: 01/13/2013 Time: 6295-2841 OT Time Calculation (min): 48 min Manual  Therapy [phone] 11' Therapeutic exercises [phone] supervised by OT Tech) 15'  Visit#: 22 of 24  Re-eval: 01/15/13    Authorization: Medicare  Authorization Time Period: before 25th visit  Authorization Visit#: 22 of 25  Subjective  S:  It feels bruised when I use it.  Limitations: progress as tolerated Pain Assessment Currently in Pain?: Yes Pain Score: 1  Pain Location: Shoulder Pain Orientation: Left Pain Type: Acute pain  Precautions/Restrictions   progress as tolerated   Exercise/Treatments Supine Protraction: PROM;10 reps;Strengthening;15 reps Protraction Weight (lbs): 1 Horizontal ABduction: PROM;10 reps;Strengthening;15 reps Horizontal ABduction Weight (lbs): 1 External Rotation: PROM;10 reps;Strengthening;15 reps External Rotation Weight (lbs): 1 Internal Rotation: PROM;10 reps;Strengthening;15 reps Internal Rotation Weight (lbs): 1 Flexion: PROM;10 reps;Strengthening;15 reps Shoulder Flexion Weight (lbs): 1 ABduction: PROM;10 reps;Strengthening;15 reps Shoulder ABduction Weight (lbs): 1 Sidelying External Rotation: Strengthening;15 reps External Rotation Weight (lbs): 1 Internal Rotation: Strengthening;15 reps Internal Rotation Weight (lbs): 1 Flexion: Strengthening;15 reps Flexion Weight (lbs): 1 ABduction: Strengthening;15 reps ABduction Weight (lbs): 1 Standing Protraction: AROM;10 reps Horizontal ABduction: AAROM;15 reps External Rotation: AROM;Theraband;15 reps Theraband Level (Shoulder External Rotation): Level 3 (Green) Internal Rotation: AROM;Theraband;15 reps Theraband Level (Shoulder Internal Rotation): Level 3 (Green) Flexion: AROM;15 reps ABduction: AROM;15 reps Extension: Theraband;15 reps Theraband Level (Shoulder Extension):  Level 3 (Green) Row: Theraband;15 reps Theraband Level (Shoulder Row): Level 3 (Green) Retraction: Theraband;15 reps Theraband Level (Shoulder Retraction): Level 3 (Green) ROM / Strengthening / Isometric Strengthening UBE (Upper Arm Bike): 3' forward and 3' reverse 3.0 Cybex Press: 2 plate;15 reps Cybex Row: 2 plate;15 reps Over Head Lace: 3' seated Wall Pushups: 10 reps Ball on Wall: 1' with arm flexed to 90 and 1' with arm abducted to 90      Manual Therapy Manual Therapy: Myofascial release Myofascial Release: MFR and manual stretching to left upper arm to decrease fascial restrictions and increase joint mobility in a pain free zone  Occupational Therapy Assessment and Plan OT Assessment and Plan Clinical Impression Statement: A:  Added 1# to sidelying exercises with good form.  OT Plan: P:  reassess for md visit, complete horizontal abduction without assistance.    Goals Short Term Goals Short Term Goal 1: Patient will be educated on HEP.  Short Term Goal 2: Patient will increase PROM to WNL in his left shoulder for increased independence with donning and doffing shirts.  Short Term Goal 3: Patient will have 4-/5 strength in his left shoulder in order to lift heavy items up and overhead into cabinets.  Short Term Goal 4: Patient will decrease pain to 3/10 when sleeping.  Short Term Goal 5: Patient will decrease fascial restrictions from max to mod in his left shoulder region for increased mobility with daily activities.  Long Term Goals Long Term Goal 1: Patient will return to highest level of indepedence with all daily and leisure activities.  Long Term Goal 2: Patient will have 5/5 strength in his left shoulder in order to lift and carry car parts/accessories when needed.  Long Term Goal 3: Patient will decrease pain to 1/10 during daily activities.  Long Term Goal 4: Patient will decrease fascial restrictions from mod to min in his left shoulder region for increased mobility  with daily activities.   Problem List Patient Active Problem List   Diagnosis Date Noted  .  Pain in joint, shoulder region 10/23/2012  . Muscle weakness (generalized) 10/23/2012  . S/P rotator cuff repair 10/23/2012  . Complete tear of rotator cuff 09/09/2012  . History of radiation therapy   . Prostate cancer 12/18/2010    End of Session Activity Tolerance: Patient tolerated treatment well General Behavior During Therapy: [facility] for tasks assessed/performed  GO    Shirlean Mylar, OTR/L  01/13/2013, 10:03 AM

## 2013-01-15 ENCOUNTER — Ambulatory Visit (HOSPITAL_COMMUNITY)
Admission: RE | Admit: 2013-01-15 | Discharge: 2013-01-15 | Disposition: A | Discharge status: Home / Self Care | Payer: Medicare Other | Source: Ambulatory Visit | Attending: Family Medicine | Admitting: Family Medicine

## 2013-01-15 NOTE — Evaluation (Signed)
Occupational Therapy Re-Evaluation  Patient Details  Name: James Dyer MRN: [id number] Date of Birth: 06/03/1945  Today's Date: 01/15/2013 Time: 4098-1191 OT Time Calculation (min): 56 min Manual therapy 845 [phone] 25' Reassessment 910-925 15' Therapeutic exercises 925-941 10' Visit#: 23 of 32  Re-eval: 02/12/13  Assessment Diagnosis: s/p Left rotator cuff repair  Authorization: Medicare  Authorization Time Period: before 33rd visit  Authorization Visit#: 6 of 33   Past Medical History:  Past Medical History  Diagnosis Date  . Hypertension   . Hypercholesterolemia   . 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 Borderline diabetes   . Cancer 2013    prostate   . Hepatitis     "pt told had been exposed to hepatitis when he went to donate blood"  . Nephrolithiasis   . 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 Foot surgery Right 1980's    calcium deposit removed  . Colonoscopy N/A 07/22/2012    Procedure: COLONOSCOPY;  Surgeon: Dalia Heading, MD;  Location: [facility];  Service: Gastroenterology;  Laterality: N/A;  . Prostate biopsy  2013  . Shoulder open rotator cuff repair Left 09/09/2012    Procedure: LEFT ROTATOR CUFF REPAIR SHOULDER OPEN;  Surgeon: Jacki Cones, MD;  Location: [facility];  Service: Orthopedics;  Laterality: Left;    Subjective  S:  its easier for me to reach across the table to reach the salt and my pills.  I work in [employer].  I want to work on my strength so that I can reach and lift things overhead.  Limitations: progress as tolerated Special Tests: FOTO was 55.7 and is currently 62 Pain Assessment Currently in Pain?: Yes Pain Score: 1  Pain Location: Shoulder Pain Orientation: Left Pain Type: Acute pain  Additional Assessments LUE AROM (degrees) LUE Overall AROM  Comments: assessed in seated ER/IR with shoulder adducted (12/16/12) Left Shoulder Flexion: 125 Degrees (100) Left Shoulder ABduction: 90 Degrees (86) Left Shoulder Internal Rotation: 75 Degrees (75) Left Shoulder External Rotation: 74 Degrees (70) LUE Strength LUE Overall Strength Comments: assessed in seated  Left Shoulder Flexion: 3/5 (3-5) Left Shoulder ABduction: 3/5 (3-/5) Left Shoulder Internal Rotation: 4/5 (3/5) Left Shoulder External Rotation: 3+/5 (3/5)     Exercise/Treatments Supine Protraction: PROM;10 reps Horizontal ABduction: PROM;10 reps External Rotation: PROM;10 reps Internal Rotation: PROM;10 reps Flexion: PROM;10 reps ABduction: PROM;10 reps Standing External Rotation: Theraband;15 reps Theraband Level (Shoulder External Rotation): Level 2 (Red) Internal Rotation: Theraband;15 reps Theraband Level (Shoulder Internal Rotation): Level 2 (Red) Extension: Theraband;15 reps Theraband Level (Shoulder Extension): Level 2 (Red) Row: Theraband;15 reps Theraband Level (Shoulder Row): Level 2 (Red) Retraction: Theraband;15 reps Theraband Level (Shoulder Retraction): Level 2 (Red) ROM / Strengthening / Isometric Strengthening UBE (Upper Arm Bike): 3' forward and 3' reverse 4.0 Wall Pushups: 15 reps Ball on Wall: 1' with arm flexed to 90 and 1' with arm abducted to 90      Manual Therapy Manual Therapy: Myofascial release Myofascial Release: MFR and manual stretching to left upper arm to decrease fascial restrictions and increase joint mobility in a pain free zone  Occupational Therapy Assessment and Plan OT Assessment and Plan Clinical Impression Statement: A:  Reassessment completed this date.  Patient has met all short term goals and is working towards unmet  goals.  Deficits are strength and AROM into abduction.   OT Frequency: Min 2X/week OT Duration: 4 weeks OT Plan: P:  Continue 2 times a week x 4 weeks focusing on strengthening and end range for increased  ability to reach overhead and out to the side.    Goals Short Term Goals Short Term Goal 1: Patient will be educated on HEP.  Short Term Goal 2: Patient will increase PROM to WNL in his left shoulder for increased independence with donning and doffing shirts.  Short Term Goal 3: Patient will have 4-/5 strength in his left shoulder in order to lift heavy items up and overhead into cabinets.  Short Term Goal 4: Patient will decrease pain to 3/10 when sleeping.  Short Term Goal 5: Patient will decrease fascial restrictions from max to mod in his left shoulder region for increased mobility with daily activities.  Long Term Goals Long Term Goal 1: Patient will return to highest level of indepedence with all daily and leisure activities.  Long Term Goal 1 Progress: Progressing toward goal Long Term Goal 2: Patient will have 5/5 strength in his left shoulder in order to lift and carry car parts/accessories when needed.  Long Term Goal 2 Progress: Progressing toward goal Long Term Goal 3: Patient will decrease pain to 1/10 during daily activities.  Long Term Goal 3 Progress: Progressing toward goal Long Term Goal 4: Patient will decrease fascial restrictions from mod to min in his left shoulder region for increased mobility with daily activities.  Long Term Goal 4 Progress: Met Long Term Goal 5: New Goal 01/15/13:  Patient will have WFL AROM in left shoulder in order to reach overhead cabinets in his building. Long Term Goal 5 Progress: Progressing toward goal  Problem List Patient Active Problem List   Diagnosis Date Noted  . Pain in joint, shoulder region 10/23/2012  . Muscle weakness (generalized) 10/23/2012  . S/P rotator cuff repair 10/23/2012  . Complete tear of rotator cuff 09/09/2012  . History of radiation therapy   . Prostate cancer 12/18/2010    End of Session Activity Tolerance: Patient tolerated treatment well General Behavior During Therapy: [facility] for tasks  assessed/performed OT Plan of Care OT Home Exercise Plan: Reiterated importance of completing HEP and discussed proper heights to anchor band.  Consulted and Agree with Plan of Care: Patient  GO Functional Assessment Tool Used: FOTO was 55.7 and is currently 64 Functional Limitation: Carrying, moving and handling objects Carrying, Moving and Handling Objects Current Status ([id number]): At least 20 percent but less than 40 percent impaired, limited or restricted Carrying, Moving and Handling Objects Goal Status [phone]): At least 1 percent but less than 20 percent impaired, limited or restricted  Shirlean Mylar, OTR/L  01/15/2013, 9:41 AM  Physician Documentation Your signature is required to indicate approval of the treatment plan as stated above.  Please sign and either send electronically or make a copy of this report for your files and return this physician signed original.  Please mark one 1.__approve of plan  2. ___approve of plan with the following conditions.   ______________________________                                                          _____________________ Physician Signature  Date  

## 2013-01-20 ENCOUNTER — Ambulatory Visit (HOSPITAL_COMMUNITY)
Admission: RE | Admit: 2013-01-20 | Discharge: 2013-01-20 | Disposition: A | Discharge status: Home / Self Care | Payer: Medicare Other | Source: Ambulatory Visit | Attending: Family Medicine | Admitting: Family Medicine

## 2013-01-20 NOTE — Progress Notes (Signed)
Occupational Therapy Treatment Patient Details  Name: James Dyer MRN: [id number] Date of Birth: 12-May-1945  Today's Date: 01/20/2013 Time: 0803-0850 OT Time Calculation (min): 47 min Manual 7829-5621 (12') TherExercises 3086-5784 (35')  Visit#: 24 of    Re-eval: 02/12/13    Authorization: Medicare  Authorization Time Period: before 33rd visit  Authorization Visit#: 24 of 33  Subjective Symptoms/Limitations Symptoms: S:  I havent done too much so it is just a little ache....  Pain Assessment Currently in Pain?: Yes Pain Score: 2  Pain Location: Shoulder Pain Orientation: Left Pain Type: Acute pain     Exercise/Treatments Supine Protraction: PROM;10 reps Horizontal ABduction: PROM;10 reps External Rotation: PROM;10 reps Internal Rotation: PROM;10 reps Flexion: PROM;10 reps ABduction: PROM;10 reps Seated Protraction: AROM;15 reps Horizontal ABduction: AROM;15 reps   Sidelying External Rotation: Strengthening;15 reps External Rotation Weight (lbs): 1 Internal Rotation: Strengthening;15 reps Internal Rotation Weight (lbs): 1 Flexion: Strengthening;15 reps Flexion Weight (lbs): 1 ABduction: Strengthening;15 reps ABduction Weight (lbs): 1 Standing External Rotation: Theraband;15 reps Theraband Level (Shoulder External Rotation): Level 2 (Red) Internal Rotation: Theraband;15 reps Theraband Level (Shoulder Internal Rotation): Level 2 (Red) Extension: Theraband;15 reps Theraband Level (Shoulder Extension): Level 2 (Red) Row: Theraband;15 reps Theraband Level (Shoulder Row): Level 2 (Red) Retraction: Theraband;15 reps Theraband Level (Shoulder Retraction): Level 2 (Red)   ROM / Strengthening / Isometric Strengthening UBE (Upper Arm Bike): 3' forward and 3' reverse 4.0 Ball on Wall: 1' with arm flexed to 90 and 1' with arm abducted to 90 Other ROM/Strengthening Exercises: finger ladder holes x 6 with 10" second hold in flexion; x 3 in abduction x 8-10  second hold         Manual Therapy Manual Therapy: Myofascial release Myofascial Release: MFR and manual stretching to left upper arm to decrease fascial restrictions and increase joint mobility in a pain free zone  Occupational Therapy Assessment and Plan OT Assessment and Plan Clinical Impression Statement: A:  patient arrives with cont complaint of soreness in joint this date.  Continues with increased weakness, difficulty with overhead reaching tasks and AROM flexion/abduction range in sitting  OT Plan: P:  reaching in flexion and abduction for increased strengthening and functional use.    Goals Short Term Goals Short Term Goal 1: Patient will be educated on HEP.  Short Term Goal 1 Progress: Met Short Term Goal 2: Patient will increase PROM to WNL in his left shoulder for increased independence with donning and doffing shirts.  Short Term Goal 2 Progress: Met Short Term Goal 3: Patient will have 4-/5 strength in his left shoulder in order to lift heavy items up and overhead into cabinets.  Short Term Goal 3 Progress: Progressing toward goal Short Term Goal 4: Patient will decrease pain to 3/10 when sleeping.  Short Term Goal 4 Progress: Met Short Term Goal 5: Patient will decrease fascial restrictions from max to mod in his left shoulder region for increased mobility with daily activities.  Short Term Goal 5 Progress: Met Long Term Goals Long Term Goal 1: Patient will return to highest level of indepedence with all daily and leisure activities.  Long Term Goal 1 Progress: Progressing toward goal Long Term Goal 2: Patient will have 5/5 strength in his left shoulder in order to lift and carry car parts/accessories when needed.  Long Term Goal 2 Progress: Progressing toward goal Long Term Goal 3: Patient will decrease pain to 1/10 during daily activities.  Long Term Goal 3 Progress: Progressing toward goal Long Term Goal  4: Patient will decrease fascial restrictions from mod to min  in his left shoulder region for increased mobility with daily activities.  Long Term Goal 4 Progress: Met Long Term Goal 5: New Goal 01/15/13:  Patient will have WFL AROM in left shoulder in order to reach overhead cabinets in his building. Long Term Goal 5 Progress: Progressing toward goal  Problem List Patient Active Problem List   Diagnosis Date Noted  . Pain in joint, shoulder region 10/23/2012  . Muscle weakness (generalized) 10/23/2012  . S/P rotator cuff repair 10/23/2012  . Complete tear of rotator cuff 09/09/2012  . History of radiation therapy   . Prostate cancer 12/18/2010    End of Session Activity Tolerance: Patient tolerated treatment well General Behavior During Therapy: [facility] for tasks assessed/performed  GO    Velora Mediate, OTR/L  01/20/2013, 8:51 AM

## 2013-01-23 ENCOUNTER — Ambulatory Visit (HOSPITAL_COMMUNITY): Payer: Medicare Other | Admitting: Occupational Therapy

## 2013-01-27 ENCOUNTER — Ambulatory Visit (HOSPITAL_COMMUNITY): Payer: Medicare Other | Admitting: Occupational Therapy

## 2013-01-28 ENCOUNTER — Telehealth (HOSPITAL_COMMUNITY): Payer: Self-pay

## 2013-02-03 ENCOUNTER — Ambulatory Visit (HOSPITAL_COMMUNITY): Payer: Medicare Other | Admitting: Specialist

## 2013-02-05 ENCOUNTER — Ambulatory Visit (HOSPITAL_COMMUNITY): Payer: Medicare Other | Admitting: Specialist

## 2013-03-06 ENCOUNTER — Other Ambulatory Visit (HOSPITAL_COMMUNITY): Payer: Self-pay | Admitting: Family Medicine

## 2013-03-06 ENCOUNTER — Ambulatory Visit (HOSPITAL_COMMUNITY)
Admission: RE | Admit: 2013-03-06 | Discharge: 2013-03-06 | Disposition: A | Discharge status: Home / Self Care | Payer: Medicare Other | Source: Ambulatory Visit | Attending: Family Medicine | Admitting: Family Medicine

## 2013-03-06 DIAGNOSIS — R079 Chest pain, unspecified: Secondary | ICD-10-CM | POA: Insufficient documentation

## 2013-03-06 DIAGNOSIS — E119 Type 2 diabetes mellitus without complications: Secondary | ICD-10-CM | POA: Diagnosis not present

## 2013-03-06 DIAGNOSIS — J40 Bronchitis, not specified as acute or chronic: Secondary | ICD-10-CM | POA: Diagnosis not present

## 2013-03-06 DIAGNOSIS — I10 Essential (primary) hypertension: Secondary | ICD-10-CM | POA: Insufficient documentation

## 2013-03-06 DIAGNOSIS — C61 Malignant neoplasm of prostate: Secondary | ICD-10-CM | POA: Insufficient documentation

## 2013-03-06 DIAGNOSIS — Z6838 Body mass index (BMI) 38.0-38.9, adult: Secondary | ICD-10-CM | POA: Diagnosis not present

## 2013-03-07 DIAGNOSIS — E119 Type 2 diabetes mellitus without complications: Secondary | ICD-10-CM | POA: Diagnosis not present

## 2013-03-07 DIAGNOSIS — I10 Essential (primary) hypertension: Secondary | ICD-10-CM | POA: Diagnosis not present

## 2013-03-07 DIAGNOSIS — R079 Chest pain, unspecified: Secondary | ICD-10-CM | POA: Diagnosis not present

## 2013-03-07 DIAGNOSIS — Z6838 Body mass index (BMI) 38.0-38.9, adult: Secondary | ICD-10-CM | POA: Diagnosis not present

## 2013-03-19 ENCOUNTER — Encounter: Payer: Self-pay | Admitting: Cardiovascular Disease

## 2013-03-19 ENCOUNTER — Encounter: Payer: Self-pay | Admitting: *Deleted

## 2013-03-19 ENCOUNTER — Ambulatory Visit (INDEPENDENT_AMBULATORY_CARE_PROVIDER_SITE_OTHER): Payer: Medicare Other | Admitting: Cardiovascular Disease

## 2013-03-19 VITALS — BP 150/99 | HR 100 | Ht 69.0 in | Wt 262.0 lb

## 2013-03-19 DIAGNOSIS — I10 Essential (primary) hypertension: Secondary | ICD-10-CM

## 2013-03-19 DIAGNOSIS — E785 Hyperlipidemia, unspecified: Secondary | ICD-10-CM

## 2013-03-19 DIAGNOSIS — E119 Type 2 diabetes mellitus without complications: Secondary | ICD-10-CM

## 2013-03-19 DIAGNOSIS — E11649 Type 2 diabetes mellitus with hypoglycemia without coma: Secondary | ICD-10-CM | POA: Insufficient documentation

## 2013-03-19 DIAGNOSIS — R079 Chest pain, unspecified: Secondary | ICD-10-CM | POA: Insufficient documentation

## 2013-03-19 MED ORDER — AMLODIPINE BESYLATE 5 MG PO TABS: 5.0000 mg | ORAL_TABLET | Freq: Every day | ORAL | Status: DC

## 2013-03-19 NOTE — Progress Notes (Signed)
Patient ID: James Dyer, male   DOB: Jun 05, 1945, 68 y.o.   MRN: [id number]       CARDIOLOGY CONSULT NOTE  Patient ID: James Dyer MRN: [id number] DOB/AGE: 03/31/1945 68 y.o.  Admit date: (Not on file) Primary Physician Purvis Kilts, MD  Reason for Consultation: chest pain  HPI: The patient is a 68 year old male with a past medical history of hypertension, hyperlipidemia, and diabetes mellitus who is referred for the evaluation of chest pain. A recent chest x-ray only showed bronchitic changes with no effusions, infiltrates, or pulmonary edema.  An ECG today shows sinus rhythm with PACs and PVCs with no diagnostic ST segment or T wave abnormalities. He has not had any chest pain for the past 2-3 weeks. Prior to this, he had been experiencing chest pain for approximately 2-3 weeks. It was substernal and occasionally in the right chest and left inframammary region. He described as being very severe. He had some mild associated shortness of breath. He denies associated palpitations, lightheadedness, dizziness, leg swelling and syncope. He quit smoking in 1968. He has also had sharp shooting pains in the right eye. He recently started metformin. He said that he has used cholesterol medications in the past but developed muscle aches. He said his primary care physician prescribe something new for him but it is very expensive and he has not had it filled yet.    Allergies  Allergen Reactions  . Latex Other (See Comments)    "sores in mouth after teeth cleaned"    Current Outpatient Prescriptions  Medication Sig Dispense Refill  . aspirin 81 MG tablet Take 81 mg by mouth daily.      . enalapril (VASOTEC) 20 MG tablet Take 20 mg by mouth 2 (two) times daily.      . metFORMIN (GLUCOPHAGE) 500 MG tablet Take by mouth 2 (two) times daily with a meal.      . naproxen sodium (ALEVE) 220 MG tablet Take 220 mg by mouth 2 (two) times daily as needed (pain).       . nitroGLYCERIN  (NITROSTAT) 0.4 MG SL tablet Place 0.4 mg under the tongue every 5 (five) minutes as needed for chest pain.      Marland Kitchen oxyCODONE (ROXICODONE) 5 MG immediate release tablet Take 1-2 tablets (5-10 mg total) by mouth every 3 (three) hours as needed for pain.  80 tablet  0  . sildenafil (REVATIO) 20 MG tablet Take 20 mg by mouth daily as needed. erectile dysfunction      . sodium chloride (OCEAN) 0.65 % nasal spray Place 1 spray into the nose 2 (two) times daily as needed for congestion.      . tamsulosin (FLOMAX) 0.4 MG CAPS Take 0.4 mg by mouth every evening.        No current facility-administered medications for this visit.    Past Medical History  Diagnosis Date  . Hypertension   . Hypercholesterolemia   . 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 Borderline diabetes   . Cancer 2013    prostate   . Hepatitis     "pt told had been exposed to hepatitis when he went to donate blood"  . Nephrolithiasis   . 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 Foot surgery Right 1980's    calcium deposit removed  .  Colonoscopy N/A 07/22/2012    Procedure: COLONOSCOPY;  Surgeon: [name], MD;  [facility];  Service: Gastroenterology;  Laterality: N/A;  . Prostate biopsy  2013  . Shoulder open rotator cuff repair Left 09/09/2012    Procedure: LEFT ROTATOR CUFF REPAIR SHOULDER OPEN;  Surgeon: [name], MD;  Location: [facility];  Service: Orthopedics;  Laterality: Left;    History   Social History  . Marital Status: Married    Spouse Name: N/A    Number of Children: N/A  . Years of Education: N/A   Occupational History  . Not on file.   Social History Main Topics  . Smoking status: Former Smoker -- 1.00 packs/day for 5 years    Types: Cigarettes    Quit date: 12/18/1966  . Smokeless tobacco: Never Used  . Alcohol Use: Yes      Comment: occasional beer  . Drug Use: No  . Sexual Activity: No   Other Topics Concern  . Not on file   Social History Narrative  . No narrative on file     No family history on file.   Prior to Admission medications   Medication Sig Start Date End Date Taking? Authorizing Provider  aspirin 81 MG tablet Take 81 mg by mouth daily.   Yes Historical Provider, MD  enalapril (VASOTEC) 20 MG tablet Take 20 mg by mouth 2 (two) times daily.   Yes Historical Provider, MD  metFORMIN (GLUCOPHAGE) 500 MG tablet Take by mouth 2 (two) times daily with a meal.   Yes Historical Provider, MD  naproxen sodium (ALEVE) 220 MG tablet Take 220 mg by mouth 2 (two) times daily as needed (pain).    Yes Historical Provider, MD  nitroGLYCERIN (NITROSTAT) 0.4 MG SL tablet Place 0.4 mg under the tongue every 5 (five) minutes as needed for chest pain.   Yes Historical Provider, MD  oxyCODONE (ROXICODONE) 5 MG immediate release tablet Take 1-2 tablets (5-10 mg total) by mouth every 3 (three) hours as needed for pain. 09/10/12  Yes Amber Renelda Loma, PA-C  sildenafil (REVATIO) 20 MG tablet Take 20 mg by mouth daily as needed. erectile dysfunction   Yes Historical Provider, MD  sodium chloride (OCEAN) 0.65 % nasal spray Place 1 spray into the nose 2 (two) times daily as needed for congestion.   Yes Historical Provider, MD  tamsulosin (FLOMAX) 0.4 MG CAPS Take 0.4 mg by mouth every evening.    Yes Historical Provider, MD     Review of systems complete and found to be negative unless listed above in HPI     Physical exam Blood pressure 150/99, pulse 100, height 5\' 9"  (1.753 m), weight 262 lb (118.842 kg). General: NAD Neck: No JVD, no thyromegaly or thyroid nodule.  Lungs: Clear to auscultation bilaterally with normal respiratory effort. CV: Nondisplaced PMI.  Heart regular S1/S2, no S3/S4, no murmur.  No peripheral edema.  No carotid bruit.  Normal pedal pulses.  Abdomen: Soft, nontender, no  hepatosplenomegaly, no distention.  Skin: Intact without lesions or rashes.  Neurologic: Alert and oriented x 3.  Psych: Normal affect. Extremities: No clubbing or cyanosis.  HEENT: Normal.   Labs:   Lab Results  Component Value Date   WBC 6.8 09/05/2012   HGB 13.7 09/05/2012   HCT 39.1 09/05/2012   MCV 86.7 09/05/2012   PLT 220 09/05/2012   No results found for this basename: NA, K, CL, CO2, BUN, CREATININE, CALCIUM, LABALBU, PROT, BILITOT, ALKPHOS, ALT,  AST, GLUCOSE,  in the last 168 hours Lab Results  Component Value Date   TROPONINI <0.30 03/14/2012    No results found for this basename: CHOL   No results found for this basename: HDL   No results found for this basename: LDLCALC   No results found for this basename: TRIG   No results found for this basename: CHOLHDL   No results found for this basename: LDLDIRECT         Studies: No results found.  ASSESSMENT AND PLAN:  1. Chest pain: His symptoms are significant and concerning for ischemic heart disease, especially given his risk factors of diabetes mellitus, hypertension, and hyperlipidemia. I will proceed with an echocardiogram to evaluate for structural heart disease, as well as a Lexiscan Cardiolite nuclear stress test to evaluate for ischemia. 2. HTN: Uncontrolled on enalapril 20 mg twice daily. I will start amlodipine 5 mg daily. 3. Hyperlipidemia: He was recently prescribed Zetia, but has not filled it. Lipids in October 2014 showed total cholesterol 233, HDL 53, LDL 142, and triglycerides 191.  Dispo: f/u 1 month.  Signed: Kate Sable, M.D., F.A.C.C.  03/19/2013, 2:56 PM

## 2013-03-19 NOTE — Patient Instructions (Addendum)
Your physician recommends that you schedule a follow-up appointment in: 1 month   Your physician has recommended you make the following change in your medication:   Start Norvasc 5 mg daily  Your physician has requested that you have an echocardiogram. Echocardiography is a painless test that uses sound waves to create images of your heart. It provides your doctor with information about the size and shape of your heart and how well your heart's chambers and valves are working. This procedure takes approximately one hour. There are no restrictions for this procedure.   Your physician has requested that you have a lexiscan myoview. For further information please visit [email or web address]. Please follow instruction sheet, as given.   Thanks for choosing [facility] !

## 2013-03-25 ENCOUNTER — Other Ambulatory Visit (HOSPITAL_COMMUNITY): Payer: Medicare Other

## 2013-03-27 ENCOUNTER — Other Ambulatory Visit (HOSPITAL_COMMUNITY): Payer: Medicare Other

## 2013-03-30 ENCOUNTER — Ambulatory Visit (HOSPITAL_COMMUNITY)
Admission: RE | Admit: 2013-03-30 | Discharge: 2013-03-30 | Disposition: A | Discharge status: Home / Self Care | Payer: Medicare Other | Source: Ambulatory Visit | Attending: Cardiovascular Disease | Admitting: Cardiovascular Disease

## 2013-03-30 DIAGNOSIS — Z87891 Personal history of nicotine dependence: Secondary | ICD-10-CM | POA: Insufficient documentation

## 2013-03-30 DIAGNOSIS — E785 Hyperlipidemia, unspecified: Secondary | ICD-10-CM | POA: Insufficient documentation

## 2013-03-30 DIAGNOSIS — R079 Chest pain, unspecified: Secondary | ICD-10-CM | POA: Insufficient documentation

## 2013-03-30 DIAGNOSIS — Z6838 Body mass index (BMI) 38.0-38.9, adult: Secondary | ICD-10-CM | POA: Diagnosis not present

## 2013-03-30 DIAGNOSIS — I10 Essential (primary) hypertension: Secondary | ICD-10-CM | POA: Diagnosis not present

## 2013-03-30 DIAGNOSIS — I517 Cardiomegaly: Secondary | ICD-10-CM | POA: Insufficient documentation

## 2013-03-30 DIAGNOSIS — I519 Heart disease, unspecified: Secondary | ICD-10-CM | POA: Diagnosis not present

## 2013-03-30 DIAGNOSIS — E119 Type 2 diabetes mellitus without complications: Secondary | ICD-10-CM | POA: Diagnosis not present

## 2013-03-30 NOTE — Progress Notes (Signed)
*  PRELIMINARY RESULTS* Echocardiogram 2D Echocardiogram has been performed.  [place], [place] 03/30/2013, 11:40 AM

## 2013-04-01 ENCOUNTER — Encounter (HOSPITAL_COMMUNITY): Payer: Self-pay

## 2013-04-01 ENCOUNTER — Encounter (HOSPITAL_COMMUNITY)
Admission: RE | Admit: 2013-04-01 | Discharge: 2013-04-01 | Disposition: A | Discharge status: Home / Self Care | Payer: Medicare Other | Source: Ambulatory Visit | Attending: Cardiovascular Disease | Admitting: Cardiovascular Disease

## 2013-04-01 DIAGNOSIS — R079 Chest pain, unspecified: Secondary | ICD-10-CM | POA: Diagnosis not present

## 2013-04-01 DIAGNOSIS — E785 Hyperlipidemia, unspecified: Secondary | ICD-10-CM | POA: Diagnosis not present

## 2013-04-01 DIAGNOSIS — E119 Type 2 diabetes mellitus without complications: Secondary | ICD-10-CM | POA: Diagnosis not present

## 2013-04-01 DIAGNOSIS — I10 Essential (primary) hypertension: Secondary | ICD-10-CM | POA: Diagnosis not present

## 2013-04-01 HISTORY — DX: Type 2 diabetes mellitus without complications: E11.9

## 2013-04-01 MED ORDER — REGADENOSON 0.4 MG/5ML IV SOLN
INTRAVENOUS | Status: AC
  Administered 2013-04-01: 0.4 mg via INTRAVENOUS
  Filled 2013-04-01: qty 5

## 2013-04-01 MED ORDER — SODIUM CHLORIDE 0.9 % IJ SOLN
INTRAMUSCULAR | Status: AC
  Administered 2013-04-01: 10 mL via INTRAVENOUS
  Filled 2013-04-01: qty 10

## 2013-04-01 MED ORDER — TECHNETIUM TC 99M SESTAMIBI GENERIC - CARDIOLITE
30.0000 | Freq: Once | INTRAVENOUS | Status: AC | PRN
Start: 2013-04-01 — End: 2013-04-01
  Administered 2013-04-01: 30 via INTRAVENOUS

## 2013-04-01 MED ORDER — TECHNETIUM TC 99M SESTAMIBI - CARDIOLITE
10.0000 | Freq: Once | INTRAVENOUS | Status: AC | PRN
  Administered 2013-04-01: 08:00:00 10 via INTRAVENOUS

## 2013-04-01 NOTE — Progress Notes (Signed)
Stress Lab Nurses Notes - [place] 04/01/2013 Reason for doing test: Chest Pain Type of test: Wille Glaser Nurse performing test: Gerrit Halls, RN Nuclear Medicine Tech: Melburn Hake Echo Tech: Not Applicable MD performing test: S. McDowell/K.Purcell Nails NP Family MD: Dr. [name] Test explained and consent signed: yes IV started: 22g jelco, Saline lock flushed, No redness or edema and Saline lock started in radiology Symptoms: Dizziness Treatment/Intervention: None Reason test stopped: protocol completed After recovery IV was: Discontinued via X-ray tech and No redness or edema Patient to return to [place]. Med at : 10:15 Patient discharged: Home Patient's Condition upon discharge was: stable Comments: During test BP 139/81& HR 109.  Recovery BP 134/83 & HR 87.  Symptoms resolved in recovery. Geanie Cooley T

## 2013-04-09 ENCOUNTER — Telehealth: Payer: Self-pay | Admitting: Cardiovascular Disease

## 2013-04-09 NOTE — Telephone Encounter (Signed)
Results of myoview done on 3/4. / tgs

## 2013-04-15 ENCOUNTER — Encounter: Payer: Self-pay | Admitting: Adult Health

## 2013-04-15 ENCOUNTER — Ambulatory Visit (INDEPENDENT_AMBULATORY_CARE_PROVIDER_SITE_OTHER): Payer: Medicare Other | Admitting: Adult Health

## 2013-04-15 VITALS — BP 130/82 | HR 80 | Ht 69.0 in | Wt 259.8 lb

## 2013-04-15 DIAGNOSIS — I10 Essential (primary) hypertension: Secondary | ICD-10-CM

## 2013-04-15 DIAGNOSIS — R079 Chest pain, unspecified: Secondary | ICD-10-CM | POA: Diagnosis not present

## 2013-04-15 NOTE — Progress Notes (Signed)
HPI: James Dyer is a 68 year old male patient of Dr. Pennelope Bracken we are following for ongoing assessment and management of hypertension, with cardiovascular risk factors of hyperlipidemia and diabetes. The patient was seen by Dr. Pennelope Bracken on 03/19/2013 for evaluation of chest pain. Patient was scheduled for Lexiscan Cardiolite stress test and an echocardiogram. Blood pressure was not controlled and amlodipine 5 mg daily was added.    Lexiscan Myoview was completed on 04/01/2013:Abnormal, but overall low risk Lexiscan Cardiolite. There were nodiagnostic ST segment abnormalities or chest pain. Perfusion imaging suggest possible area of scar with mild peri-infarct ischemia in the inferior wall, LVEF 55% with apparent inferior basal hypokinesis and normal volumes.    Mild LVH with LVEF 03-54%, grade 1 diastolic dysfunction. Mild left atrial enlargement. Trivial mitral and tricuspid regurgitation. Unable to assess PASP. Aortic arch with mild calcification - accoustic artifact noted. No pericardial effusion.   He comes today without recurrent chest pain. He has stopped caffeine intake and has had a significant change in chest discomfort, He has not experienced any further symptoms. He still has some GERD.     Blood pressure is much better controlled.         Allergies  Allergen Reactions  . Latex Other (See Comments)    "sores in mouth after teeth cleaned"    Current Outpatient Prescriptions  Medication Sig Dispense Refill  . amLODipine (NORVASC) 5 MG tablet Take 1 tablet (5 mg total) by mouth daily.  60 tablet  6  . aspirin 81 MG tablet Take 81 mg by mouth daily.      . colesevelam (WELCHOL) 625 MG tablet Take 1,875 mg by mouth 2 (two) times daily with a meal.      . enalapril (VASOTEC) 20 MG tablet Take 20 mg by mouth 2 (two) times daily.      . metFORMIN (GLUCOPHAGE) 500 MG tablet Take by mouth 2 (two) times daily with a meal.      . nitroGLYCERIN (NITROSTAT) 0.4 MG SL tablet Place 0.4 mg  under the tongue every 5 (five) minutes as needed for chest pain.      Marland Kitchen oxyCODONE (ROXICODONE) 5 MG immediate release tablet Take 1-2 tablets (5-10 mg total) by mouth every 3 (three) hours as needed for pain.  80 tablet  0  . sildenafil (REVATIO) 20 MG tablet Take 20 mg by mouth daily as needed. erectile dysfunction      . sodium chloride (OCEAN) 0.65 % nasal spray Place 1 spray into the nose 2 (two) times daily as needed for congestion.      . tamsulosin (FLOMAX) 0.4 MG CAPS Take 0.4 mg by mouth every evening.        No current facility-administered medications for this visit.    Past Medical History  Diagnosis Date  . Hypertension   . Hypercholesterolemia   . 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 Borderline diabetes   . Cancer 2013    prostate   . Hepatitis     "pt told had been exposed to hepatitis when he went to donate blood"  . Nephrolithiasis   . 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 Diabetes mellitus without complication     Past Surgical History  Procedure Laterality Date  . Foot surgery Right 1980's    calcium deposit removed  . Colonoscopy N/A 07/22/2012    Procedure:  COLONOSCOPY;  Surgeon: [name], MD;  Location: [facility];  Service: Gastroenterology;  Laterality: N/A;  . Prostate biopsy  2013  . Shoulder open rotator cuff repair Left 09/09/2012    Procedure: LEFT ROTATOR CUFF REPAIR SHOULDER OPEN;  Surgeon: [name], MD;  Location: [facility];  Service: Orthopedics;  Laterality: Left;    ROS: Review of systems complete and found to be negative unless listed above  BP 130/82  Pulse 80  Ht 5\' 9"  (1.753 m)  Wt 259 lb 12.8 oz (117.845 kg)  BMI 38.35 kg/m2 General: Well developed, well nourished, obese,in no acute distress Head: Eyes PERRLA, No xanthomas.   Normal cephalic and atramatic  Lungs: Clear bilaterally to auscultation and percussion. Heart: HRRR  S1 S2, without MRG.  Pulses are 2+ & equal.            No carotid bruit. No JVD.  No abdominal bruits. No femoral bruits. Abdomen: Bowel sounds are positive, obese, non-distended, abdomen soft and non-tender without masses or                  Hernia's noted. Msk:  Back normal, normal gait. Normal strength and tone for age. Extremities: No clubbing, cyanosis or edema.  DP +1 Neuro: Alert and oriented X 3. Psych:  Good affect, responds appropriately   ASSESSMENT AND PLAN

## 2013-04-15 NOTE — Assessment & Plan Note (Signed)
Resolved with cessation of caffeine use. I have reviewed stress results with Dr. Bronson Ing by phone, as he is in the [place] office, and it is not his recommendation to move forward with cardiac cath unless he remained symptomatic. He has a low risk study with normal EF.  Echo is also found to be normal with mild grade one diastolic dysfunction.   Will continue him on current medication regimen as directed and see him again in 6 months unless he is symptomatic. He is advised on wt loss and increase exercise.

## 2013-04-15 NOTE — Patient Instructions (Signed)
Your physician wants you to follow-up in: [place] will receive a reminder letter in the mail two months in advance. If you don't receive a letter, please call our office to schedule the follow-up appointment.

## 2013-04-15 NOTE — Assessment & Plan Note (Signed)
Much better controlled on addition of amlodipine 5 mg. Echo with grade I diastolic dysfunction. Will see him again in 6 months. He is advised on low sodium diet.

## 2013-06-18 ENCOUNTER — Telehealth: Payer: Self-pay | Admitting: *Deleted

## 2013-06-18 MED ORDER — AMLODIPINE BESYLATE 5 MG PO TABS: 5.0000 mg | ORAL_TABLET | Freq: Every day | ORAL | Status: DC

## 2013-06-18 NOTE — Telephone Encounter (Signed)
Pt would like amlodipine called in for 90 day instead of 60 days. [place]

## 2013-06-18 NOTE — Telephone Encounter (Signed)
Medication sent via escribe for 90 days

## 2013-07-07 ENCOUNTER — Ambulatory Visit (INDEPENDENT_AMBULATORY_CARE_PROVIDER_SITE_OTHER): Payer: Medicare Other | Admitting: Urology

## 2013-07-07 DIAGNOSIS — C61 Malignant neoplasm of prostate: Secondary | ICD-10-CM | POA: Diagnosis not present

## 2013-07-07 DIAGNOSIS — N529 Male erectile dysfunction, unspecified: Secondary | ICD-10-CM | POA: Diagnosis not present

## 2013-07-07 DIAGNOSIS — R972 Elevated prostate specific antigen [PSA]: Secondary | ICD-10-CM | POA: Diagnosis not present

## 2013-07-07 DIAGNOSIS — N400 Enlarged prostate without lower urinary tract symptoms: Secondary | ICD-10-CM | POA: Diagnosis not present

## 2013-08-05 DIAGNOSIS — M545 Low back pain, unspecified: Secondary | ICD-10-CM | POA: Diagnosis not present

## 2013-08-05 DIAGNOSIS — G609 Hereditary and idiopathic neuropathy, unspecified: Secondary | ICD-10-CM | POA: Diagnosis not present

## 2013-08-05 DIAGNOSIS — Z6838 Body mass index (BMI) 38.0-38.9, adult: Secondary | ICD-10-CM | POA: Diagnosis not present

## 2013-08-05 DIAGNOSIS — M216X9 Other acquired deformities of unspecified foot: Secondary | ICD-10-CM | POA: Diagnosis not present

## 2013-08-11 ENCOUNTER — Other Ambulatory Visit (HOSPITAL_COMMUNITY): Payer: Self-pay | Admitting: Physician Assistant

## 2013-08-11 DIAGNOSIS — IMO0002 Reserved for concepts with insufficient information to code with codable children: Secondary | ICD-10-CM | POA: Diagnosis not present

## 2013-08-11 DIAGNOSIS — S335XXA Sprain of ligaments of lumbar spine, initial encounter: Secondary | ICD-10-CM | POA: Diagnosis not present

## 2013-08-11 DIAGNOSIS — Z681 Body mass index (BMI) 19 or less, adult: Secondary | ICD-10-CM | POA: Diagnosis not present

## 2013-08-13 ENCOUNTER — Ambulatory Visit (HOSPITAL_COMMUNITY)
Admission: RE | Admit: 2013-08-13 | Discharge: 2013-08-13 | Disposition: A | Discharge status: Home / Self Care | Payer: Medicare Other | Source: Ambulatory Visit | Attending: Physician Assistant | Admitting: Physician Assistant

## 2013-08-13 DIAGNOSIS — M545 Low back pain, unspecified: Secondary | ICD-10-CM | POA: Insufficient documentation

## 2013-08-13 DIAGNOSIS — R29898 Other symptoms and signs involving the musculoskeletal system: Secondary | ICD-10-CM | POA: Diagnosis not present

## 2013-08-13 DIAGNOSIS — Z8546 Personal history of malignant neoplasm of prostate: Secondary | ICD-10-CM | POA: Insufficient documentation

## 2013-08-13 DIAGNOSIS — S335XXA Sprain of ligaments of lumbar spine, initial encounter: Secondary | ICD-10-CM

## 2013-08-13 DIAGNOSIS — M51379 Other intervertebral disc degeneration, lumbosacral region without mention of lumbar back pain or lower extremity pain: Secondary | ICD-10-CM | POA: Insufficient documentation

## 2013-08-13 DIAGNOSIS — M48061 Spinal stenosis, lumbar region without neurogenic claudication: Secondary | ICD-10-CM | POA: Insufficient documentation

## 2013-08-13 DIAGNOSIS — M5137 Other intervertebral disc degeneration, lumbosacral region: Secondary | ICD-10-CM | POA: Diagnosis not present

## 2013-08-13 DIAGNOSIS — IMO0002 Reserved for concepts with insufficient information to code with codable children: Secondary | ICD-10-CM

## 2013-08-13 DIAGNOSIS — M25559 Pain in unspecified hip: Secondary | ICD-10-CM | POA: Insufficient documentation

## 2013-08-13 DIAGNOSIS — M5126 Other intervertebral disc displacement, lumbar region: Secondary | ICD-10-CM | POA: Diagnosis not present

## 2013-08-25 ENCOUNTER — Other Ambulatory Visit: Payer: Self-pay | Admitting: Neurosurgery

## 2013-08-25 DIAGNOSIS — M5126 Other intervertebral disc displacement, lumbar region: Secondary | ICD-10-CM | POA: Diagnosis not present

## 2013-08-25 NOTE — Pre-Procedure Instructions (Signed)
James Dyer  08/25/2013   Your procedure is scheduled on:  Tues, Aug 4 @ 5:00 PM  Report to [facility]  at 2:00 PM.  Call this number if you have problems the morning of surgery: [phone]   Remember:   Do not eat food or drink liquids after midnight.   Take these medicines the morning of surgery with A SIP OF WATER: Amlodipine(Norvasc) and Pain Pill(if needed)                Stop taking your Aspirin. No Goody's,BC's,Aleve,Ibuprofen,Fish Oil,or any Herbal Medications   Do not wear jewelry  Do not wear lotions, powders, or colognes. You may wear deodorant.  Men may shave face and neck.  Do not bring valuables to the hospital.  [facility] is not responsible                  for any belongings or valuables.               Contacts, dentures or bridgework may not be worn into surgery.  Leave suitcase in the car. After surgery it may be brought to your room.  For patients admitted to the hospital, discharge time is determined by your                treatment team.               Patients discharged the day of surgery will not be allowed to drive  home.    Special Instructions:  [place] - Preparing for Surgery  Before surgery, you can play an important role.  Because skin is not sterile, your skin needs to be as free of germs as possible.  You can reduce the number of germs on you skin by washing with CHG (chlorahexidine gluconate) soap before surgery.  CHG is an antiseptic cleaner which kills germs and bonds with the skin to continue killing germs even after washing.  Please DO NOT use if you have an allergy to CHG or antibacterial soaps.  If your skin becomes reddened/irritated stop using the CHG and inform your nurse when you arrive at Short Stay.  Do not shave (including legs and underarms) for at least 48 hours prior to the first CHG shower.  You may shave your face.  Please follow these instructions carefully:   1.  Shower with CHG Soap the night before surgery  and the                                morning of Surgery.  2.  If you choose to wash your hair, wash your hair first as usual with your       normal shampoo.  3.  After you shampoo, rinse your hair and body thoroughly to remove the                      Shampoo.  4.  Use CHG as you would any other liquid soap.  You can apply chg directly       to the skin and wash gently with scrungie or a clean washcloth.  5.  Apply the CHG Soap to your body ONLY FROM THE NECK DOWN.        Do not use on open wounds or open sores.  Avoid contact with your eyes,       ears, mouth and genitals (private  parts).  Wash genitals (private parts)       with your normal soap.  6.  Wash thoroughly, paying special attention to the area where your surgery        will be performed.  7.  Thoroughly rinse your body with warm water from the neck down.  8.  DO NOT shower/wash with your normal soap after using and rinsing off       the CHG Soap.  9.  Pat yourself dry with a clean towel.            10.  Wear clean pajamas.            11.  Place clean sheets on your bed the night of your first shower and do not        sleep with pets.  Day of Surgery  Do not apply any lotions/deoderants the morning of surgery.  Please wear clean clothes to the hospital/surgery center.     Please read over the following fact sheets that you were given: Pain Booklet, Coughing and Deep Breathing, MRSA Information and Surgical Site Infection Prevention

## 2013-08-26 ENCOUNTER — Encounter (HOSPITAL_COMMUNITY): Payer: Self-pay

## 2013-08-26 ENCOUNTER — Encounter (HOSPITAL_COMMUNITY): Payer: Self-pay | Admitting: Pharmacy Technician

## 2013-08-26 ENCOUNTER — Encounter (HOSPITAL_COMMUNITY)
Admission: RE | Admit: 2013-08-26 | Discharge: 2013-08-26 | Disposition: A | Discharge status: Home / Self Care | Payer: Medicare Other | Source: Ambulatory Visit | Attending: Neurosurgery | Admitting: Neurosurgery

## 2013-08-26 DIAGNOSIS — Z01812 Encounter for preprocedural laboratory examination: Secondary | ICD-10-CM | POA: Insufficient documentation

## 2013-08-26 DIAGNOSIS — Z01818 Encounter for other preprocedural examination: Secondary | ICD-10-CM | POA: Insufficient documentation

## 2013-08-26 HISTORY — DX: Myoneural disorder, unspecified: G70.9

## 2013-08-26 HISTORY — DX: Unspecified osteoarthritis, unspecified site: M19.90

## 2013-08-26 HISTORY — DX: Personal history of other medical treatment: Z92.89

## 2013-08-26 LAB — BASIC METABOLIC PANEL
Anion gap: 17 — ABNORMAL HIGH (ref 5–15)
BUN: 25 mg/dL — AB (ref 6–23)
CALCIUM: 9.4 mg/dL (ref 8.4–10.5)
CO2: 25 mEq/L (ref 19–32)
CREATININE: 0.82 mg/dL (ref 0.50–1.35)
Chloride: 99 mEq/L (ref 96–112)
GFR calc non Af Amer: 89 mL/min — ABNORMAL LOW (ref >90)
GLUCOSE: 100 mg/dL — AB (ref 70–99)
POTASSIUM: 4 meq/L (ref 3.7–5.3)
Sodium: 141 mEq/L (ref 137–147)

## 2013-08-26 LAB — CBC
HCT: 41.5 % (ref 39.0–52.0)
Hemoglobin: 14 g/dL (ref 13.0–17.0)
MCH: 29.9 pg (ref 26.0–34.0)
MCHC: 33.7 g/dL (ref 30.0–36.0)
MCV: 88.7 fL (ref 78.0–100.0)
PLATELETS: 216 10*3/uL (ref 150–400)
RBC: 4.68 MIL/uL (ref 4.22–5.81)
RDW: 12.8 % (ref 11.5–15.5)
WBC: 10.2 10*3/uL (ref 4.0–10.5)

## 2013-08-26 LAB — SURGICAL PCR SCREEN
MRSA, PCR: NEGATIVE
Staphylococcus aureus: POSITIVE — AB

## 2013-08-26 NOTE — Progress Notes (Signed)
Pt. Denies difficulty breathing, chest pain or pressure. Pt. States the only time that he has SOB is when he bends over at the middle. Followed by Dr. [name], at [facility]. Pt. Reports that he stopped Welchol- will tell Dr. [name] at next appt.

## 2013-08-26 NOTE — Progress Notes (Signed)
08/26/13 1559  OBSTRUCTIVE SLEEP APNEA  Have you ever been diagnosed with sleep apnea through a sleep study? No  Do you snore loudly (loud enough to be heard through closed doors)?  1  Do you often feel tired, fatigued, or sleepy during the daytime? 1  Has anyone observed you stop breathing during your sleep? 0  Do you have, or are you being treated for high blood pressure? 1  BMI more than 35 kg/m2? 0  Age over 68 years old? 1  Neck circumference greater than 40 cm/16 inches? 1  Gender: 0  Obstructive Sleep Apnea Score 5  Score 4 or greater  Results sent to PCP

## 2013-08-27 NOTE — Progress Notes (Signed)
I called [facility] with Mupirocin ointment prescription.

## 2013-08-31 MED ORDER — CEFAZOLIN SODIUM-DEXTROSE 2-3 GM-% IV SOLR
2.0000 g | INTRAVENOUS | Status: AC
  Administered 2013-09-01: 2 g via INTRAVENOUS

## 2013-08-31 NOTE — H&P (Signed)
HISTORY OF PRESENT ILLNESS:                     Mr. James Dyer is a 68 year old retired gentleman, who presents today for about three to four week history of fairly severe pain in his back and right lower extremity.  Pain started in right hip radiated into the leg.  He has never had pain this severe nor in this distribution before.  He said that he's  had a kidney stone and this was about as bad if not worse than the kidney stone.  He had no antecedent trauma and was not involved in any sort of vigorous physical activity when it started.  He's had no bowel or bladder dysfunction.  He has been using a walker until today.  He says that today he feels better, though he still is in a great deal of pain.     REVIEW OF SYSTEMS:                                    Positive for prostate cancer, leg weakness, back pain, leg pain, leg pain with walking, hypertension, eye glasses.  He denies allergic, hematologic, psychiatric, neurological, skin, gastrointestinal, respiratory, constitutional, ear, nose, throat, and mouth.  He does have diabetes.     On his pain chart, he does have pain going down the right lower extremity.     PAST MEDICAL HISTORY:              Current Medical Conditions:  Significant for hypertension, diabetes, and prostate cancer.              Prior Operations:  He has had shoulder surgery in July of this year.              Medications and Allergies:  He has no known drug allergies.  He takes amlodipine, Bayer aspirin, diazepam, enalapril, hydrocodone, metformin, oxycodone, prednisone taper, tamsulosin and WelChol.     FAMILY HISTORY:                                            Not provided.     SOCIAL HISTORY:                                            He does not smoke.  He does not use alcohol.  Does not use illicit drugs.     PHYSICAL EXAMINATION:                                Height 68.5 inches, weight 254 pounds, BMI is 38.06, blood pressure 137/82, pulse is  76, temperature is 97.6.  Pain is 6/10.  On exam, alert, oriented x4 and answering all questions appropriately, he is in obvious distress.  He walks quite crooked with his torso tilted towards the left.  His hip just out to the right and he has a decided limp favoring the right lower extremity.  He moves very slowly.  He has some weakness in the right lower extremity about 4/5.  He has bilateral congenital foot drops and has been in braces  today, 2+ reflexes at the knees, biceps, triceps, brachioradialis, intact proprioception.  Normal muscle tone, bulk and coordination.  Romberg is negative.  Pupils are equal, round and reactive to light.  Full extraocular movements.  Full visual fields.  Hearing intact to finger rub.  Uvula elevates in midline.  Shoulder shrug is normal.  Tongue protrudes in the midline.     IMAGING STUDIES:                              MRI shows a fairly large herniate disc at L4-5 eccentric to the right side.  A great deal of spondylitic change throughout the lumbar spine facet arthropathy throughout the lumbar spine well essentially at L2-3, L3-4, L4-5 and L5-S1, but I don't think any of these are the reason prior to this large disc herniation.  He is stenotic at L3-4, L4-5 some stenosis present at L5-S1.  Again I come back to the disc and this is reason for the acute onset of pain and weakness.     IMPRESSION/PLAN:                             I have recommended that he undergo operative decompression.  He has decided to undergo surgery this coming Tuesday.  Risks and benefits: Bleeding, infection, no relief, need for further surgery, recurrence well explained.  He also received the detail instruction sheet.  He agrees and wishes to proceed.

## 2013-09-01 ENCOUNTER — Ambulatory Visit (HOSPITAL_COMMUNITY): Payer: Medicare Other | Admitting: Anesthesiology

## 2013-09-01 ENCOUNTER — Encounter (HOSPITAL_COMMUNITY)
Admission: RE | Disposition: A | Discharge status: Home / Self Care | Payer: Self-pay | Source: Ambulatory Visit | Attending: Neurosurgery

## 2013-09-01 ENCOUNTER — Encounter (HOSPITAL_COMMUNITY): Payer: Self-pay | Admitting: Certified Registered Nurse Anesthetist

## 2013-09-01 ENCOUNTER — Encounter (HOSPITAL_COMMUNITY): Payer: Medicare Other | Admitting: Anesthesiology

## 2013-09-01 ENCOUNTER — Ambulatory Visit (HOSPITAL_COMMUNITY): Payer: Medicare Other

## 2013-09-01 ENCOUNTER — Ambulatory Visit (HOSPITAL_COMMUNITY)
Admission: RE | Admit: 2013-09-01 | Discharge: 2013-09-02 | Disposition: A | Discharge status: Home / Self Care | Payer: Medicare Other | Source: Ambulatory Visit | Attending: Neurosurgery | Admitting: Neurosurgery

## 2013-09-01 DIAGNOSIS — K759 Inflammatory liver disease, unspecified: Secondary | ICD-10-CM | POA: Insufficient documentation

## 2013-09-01 DIAGNOSIS — M47817 Spondylosis without myelopathy or radiculopathy, lumbosacral region: Secondary | ICD-10-CM | POA: Diagnosis not present

## 2013-09-01 DIAGNOSIS — I10 Essential (primary) hypertension: Secondary | ICD-10-CM | POA: Diagnosis not present

## 2013-09-01 DIAGNOSIS — C61 Malignant neoplasm of prostate: Secondary | ICD-10-CM | POA: Insufficient documentation

## 2013-09-01 DIAGNOSIS — E119 Type 2 diabetes mellitus without complications: Secondary | ICD-10-CM | POA: Diagnosis not present

## 2013-09-01 DIAGNOSIS — Z6838 Body mass index (BMI) 38.0-38.9, adult: Secondary | ICD-10-CM | POA: Diagnosis not present

## 2013-09-01 DIAGNOSIS — M5126 Other intervertebral disc displacement, lumbar region: Secondary | ICD-10-CM | POA: Diagnosis not present

## 2013-09-01 HISTORY — PX: LUMBAR LAMINECTOMY/DECOMPRESSION MICRODISCECTOMY: SHX5026

## 2013-09-01 LAB — GLUCOSE, CAPILLARY
Glucose-Capillary: 98 mg/dL (ref 70–99)
Glucose-Capillary: 107 mg/dL — ABNORMAL HIGH (ref 70–99)
Glucose-Capillary: 138 mg/dL — ABNORMAL HIGH (ref 70–99)

## 2013-09-01 SURGERY — LUMBAR LAMINECTOMY/DECOMPRESSION MICRODISCECTOMY 1 LEVEL
Anesthesia: General | Site: Back | Laterality: Right

## 2013-09-01 MED ORDER — ONDANSETRON HCL 4 MG/2ML IJ SOLN
INTRAMUSCULAR | Status: AC
  Filled 2013-09-01: qty 2

## 2013-09-01 MED ORDER — HYDROMORPHONE HCL PF 1 MG/ML IJ SOLN: 0.2500 mg | INTRAMUSCULAR | Status: DC | PRN

## 2013-09-01 MED ORDER — LIDOCAINE HCL (CARDIAC) 20 MG/ML IV SOLN
INTRAVENOUS | Status: DC | PRN
  Administered 2013-09-01: 80 mg via INTRAVENOUS

## 2013-09-01 MED ORDER — TAMSULOSIN HCL 0.4 MG PO CAPS
0.4000 mg | ORAL_CAPSULE | ORAL | Status: DC
  Administered 2013-09-01: 0.4 mg via ORAL
  Filled 2013-09-01: qty 1

## 2013-09-01 MED ORDER — ROCURONIUM BROMIDE 50 MG/5ML IV SOLN
INTRAVENOUS | Status: AC
  Filled 2013-09-01: qty 1

## 2013-09-01 MED ORDER — SODIUM CHLORIDE 0.9 % IJ SOLN
3.0000 mL | Freq: Two times a day (BID) | INTRAMUSCULAR | Status: DC
  Administered 2013-09-01: 3 mL via INTRAVENOUS

## 2013-09-01 MED ORDER — ONDANSETRON HCL 4 MG/2ML IJ SOLN
INTRAMUSCULAR | Status: DC | PRN
Start: 2013-09-01 — End: 2013-09-01
  Administered 2013-09-01: 4 mg via INTRAVENOUS

## 2013-09-01 MED ORDER — MENTHOL 3 MG MT LOZG: 1.0000 | LOZENGE | OROMUCOSAL | Status: DC | PRN

## 2013-09-01 MED ORDER — CALCIUM CARBONATE ANTACID 500 MG PO CHEW
1.0000 | CHEWABLE_TABLET | Freq: Every day | ORAL | Status: DC | PRN
  Filled 2013-09-01: qty 2

## 2013-09-01 MED ORDER — OXYCODONE HCL 5 MG/5ML PO SOLN: 5.0000 mg | Freq: Once | ORAL | Status: AC | PRN

## 2013-09-01 MED ORDER — AMLODIPINE BESYLATE 5 MG PO TABS
5.0000 mg | ORAL_TABLET | Freq: Every day | ORAL | Status: DC
  Administered 2013-09-01 – 2013-09-02 (×2): 5 mg via ORAL
  Filled 2013-09-01 (×2): qty 1

## 2013-09-01 MED ORDER — THROMBIN 5000 UNITS EX SOLR
CUTANEOUS | Status: DC | PRN
  Administered 2013-09-01 (×2): 5000 [IU] via TOPICAL

## 2013-09-01 MED ORDER — ASPIRIN EC 81 MG PO TBEC
81.0000 mg | DELAYED_RELEASE_TABLET | Freq: Every day | ORAL | Status: DC
  Administered 2013-09-01 – 2013-09-02 (×2): 81 mg via ORAL
  Filled 2013-09-01 (×2): qty 1

## 2013-09-01 MED ORDER — PROPOFOL 10 MG/ML IV BOLUS
INTRAVENOUS | Status: AC
  Filled 2013-09-01: qty 20

## 2013-09-01 MED ORDER — NITROGLYCERIN 0.4 MG SL SUBL: 0.4000 mg | SUBLINGUAL_TABLET | SUBLINGUAL | Status: DC | PRN

## 2013-09-01 MED ORDER — SODIUM CHLORIDE 0.9 % IJ SOLN: 3.0000 mL | INTRAMUSCULAR | Status: DC | PRN

## 2013-09-01 MED ORDER — PHENYLEPHRINE HCL 10 MG/ML IJ SOLN
10.0000 mg | INTRAMUSCULAR | Status: DC | PRN
  Administered 2013-09-01: 15 ug/min via INTRAVENOUS

## 2013-09-01 MED ORDER — FENTANYL CITRATE 0.05 MG/ML IJ SOLN
INTRAMUSCULAR | Status: DC | PRN
  Administered 2013-09-01: 100 ug via INTRAVENOUS

## 2013-09-01 MED ORDER — POTASSIUM CHLORIDE IN NACL 20-0.9 MEQ/L-% IV SOLN
INTRAVENOUS | Status: DC
  Filled 2013-09-01 (×4): qty 1000

## 2013-09-01 MED ORDER — GLYCOPYRROLATE 0.2 MG/ML IJ SOLN
INTRAMUSCULAR | Status: DC | PRN
  Administered 2013-09-01: 0.4 mg via INTRAVENOUS

## 2013-09-01 MED ORDER — ONDANSETRON HCL 4 MG/2ML IJ SOLN: 4.0000 mg | INTRAMUSCULAR | Status: DC | PRN

## 2013-09-01 MED ORDER — NEOSTIGMINE METHYLSULFATE 10 MG/10ML IV SOLN
INTRAVENOUS | Status: DC | PRN
  Administered 2013-09-01: 2.5 mg via INTRAVENOUS

## 2013-09-01 MED ORDER — SUCCINYLCHOLINE CHLORIDE 20 MG/ML IJ SOLN
INTRAMUSCULAR | Status: AC
  Filled 2013-09-01: qty 1

## 2013-09-01 MED ORDER — LIDOCAINE-EPINEPHRINE 0.5 %-1:200000 IJ SOLN
INTRAMUSCULAR | Status: DC | PRN
  Administered 2013-09-01: 10 mL

## 2013-09-01 MED ORDER — KETOROLAC TROMETHAMINE 30 MG/ML IJ SOLN
30.0000 mg | Freq: Four times a day (QID) | INTRAMUSCULAR | Status: DC
  Administered 2013-09-01 – 2013-09-02 (×3): 30 mg via INTRAVENOUS
  Filled 2013-09-01 (×8): qty 1

## 2013-09-01 MED ORDER — MIDAZOLAM HCL 5 MG/5ML IJ SOLN
INTRAMUSCULAR | Status: DC | PRN
  Administered 2013-09-01: 2 mg via INTRAVENOUS

## 2013-09-01 MED ORDER — ROCURONIUM BROMIDE 100 MG/10ML IV SOLN
INTRAVENOUS | Status: DC | PRN
  Administered 2013-09-01: 50 mg via INTRAVENOUS

## 2013-09-01 MED ORDER — ENALAPRIL MALEATE 20 MG PO TABS
20.0000 mg | ORAL_TABLET | Freq: Two times a day (BID) | ORAL | Status: DC
  Administered 2013-09-01 – 2013-09-02 (×2): 20 mg via ORAL
  Filled 2013-09-01 (×3): qty 1

## 2013-09-01 MED ORDER — SENNA 8.6 MG PO TABS
1.0000 | ORAL_TABLET | Freq: Two times a day (BID) | ORAL | Status: DC
  Administered 2013-09-02: 8.6 mg via ORAL
  Filled 2013-09-01 (×2): qty 1

## 2013-09-01 MED ORDER — ONDANSETRON HCL 4 MG/2ML IJ SOLN: 4.0000 mg | Freq: Four times a day (QID) | INTRAMUSCULAR | Status: DC | PRN

## 2013-09-01 MED ORDER — KETOROLAC TROMETHAMINE 30 MG/ML IJ SOLN
INTRAMUSCULAR | Status: AC
  Filled 2013-09-01: qty 1

## 2013-09-01 MED ORDER — HEMOSTATIC AGENTS (NO CHARGE) OPTIME
TOPICAL | Status: DC | PRN
  Administered 2013-09-01: 1 via TOPICAL

## 2013-09-01 MED ORDER — METHYLPREDNISOLONE ACETATE 80 MG/ML IJ SUSP
INTRAMUSCULAR | Status: DC | PRN
  Administered 2013-09-01: 80 mg

## 2013-09-01 MED ORDER — PHENYLEPHRINE HCL 10 MG/ML IJ SOLN
INTRAMUSCULAR | Status: DC | PRN
  Administered 2013-09-01: 80 ug via INTRAVENOUS

## 2013-09-01 MED ORDER — HYDROCODONE-ACETAMINOPHEN 5-325 MG PO TABS: 1.0000 | ORAL_TABLET | ORAL | Status: DC | PRN

## 2013-09-01 MED ORDER — DIAZEPAM 5 MG PO TABS
5.0000 mg | ORAL_TABLET | Freq: Four times a day (QID) | ORAL | Status: DC | PRN
  Administered 2013-09-01: 5 mg via ORAL

## 2013-09-01 MED ORDER — MIDAZOLAM HCL 2 MG/2ML IJ SOLN
INTRAMUSCULAR | Status: AC
  Filled 2013-09-01: qty 2

## 2013-09-01 MED ORDER — LIDOCAINE HCL (CARDIAC) 20 MG/ML IV SOLN
INTRAVENOUS | Status: AC
  Filled 2013-09-01: qty 5

## 2013-09-01 MED ORDER — LACTATED RINGERS IV SOLN
INTRAVENOUS | Status: DC
  Administered 2013-09-01: 14:00:00 via INTRAVENOUS

## 2013-09-01 MED ORDER — OXYCODONE HCL 5 MG PO TABS: 5.0000 mg | ORAL_TABLET | ORAL | Status: DC | PRN

## 2013-09-01 MED ORDER — LACTATED RINGERS IV SOLN
INTRAVENOUS | Status: DC | PRN
  Administered 2013-09-01 (×2): via INTRAVENOUS

## 2013-09-01 MED ORDER — FENTANYL CITRATE 0.05 MG/ML IJ SOLN
INTRAMUSCULAR | Status: AC
  Filled 2013-09-01: qty 5

## 2013-09-01 MED ORDER — ACETAMINOPHEN 650 MG RE SUPP: 650.0000 mg | RECTAL | Status: DC | PRN

## 2013-09-01 MED ORDER — POLYETHYLENE GLYCOL 3350 17 G PO PACK
17.0000 g | PACK | Freq: Every day | ORAL | Status: DC | PRN
Start: 2013-09-01 — End: 2013-09-02
  Filled 2013-09-01: qty 1

## 2013-09-01 MED ORDER — EPHEDRINE SULFATE 50 MG/ML IJ SOLN
INTRAMUSCULAR | Status: DC | PRN
  Administered 2013-09-01 (×2): 10 mg via INTRAVENOUS

## 2013-09-01 MED ORDER — SALINE SPRAY 0.65 % NA SOLN
1.0000 | NASAL | Status: DC | PRN
  Filled 2013-09-01: qty 44

## 2013-09-01 MED ORDER — SODIUM CHLORIDE 0.9 % IV SOLN
250.0000 mL | INTRAVENOUS | Status: DC
  Administered 2013-09-01: 250 mL via INTRAVENOUS

## 2013-09-01 MED ORDER — FENTANYL CITRATE 0.05 MG/ML IJ SOLN
INTRAMUSCULAR | Status: DC | PRN
  Administered 2013-09-01 (×6): 50 ug via INTRAVENOUS

## 2013-09-01 MED ORDER — METFORMIN HCL 500 MG PO TABS
500.0000 mg | ORAL_TABLET | Freq: Two times a day (BID) | ORAL | Status: DC
  Administered 2013-09-02: 500 mg via ORAL
  Filled 2013-09-01 (×3): qty 1

## 2013-09-01 MED ORDER — OXYCODONE HCL 5 MG PO TABS
ORAL_TABLET | ORAL | Status: AC
  Filled 2013-09-01: qty 1

## 2013-09-01 MED ORDER — SILDENAFIL CITRATE 20 MG PO TABS: 20.0000 mg | ORAL_TABLET | Freq: Every day | ORAL | Status: DC | PRN

## 2013-09-01 MED ORDER — PROPOFOL 10 MG/ML IV BOLUS
INTRAVENOUS | Status: DC | PRN
  Administered 2013-09-01: 30 mg via INTRAVENOUS
  Administered 2013-09-01: 150 mg via INTRAVENOUS

## 2013-09-01 MED ORDER — FENTANYL CITRATE 0.05 MG/ML IJ SOLN
INTRAMUSCULAR | Status: AC
  Filled 2013-09-01: qty 2

## 2013-09-01 MED ORDER — ACETAMINOPHEN 325 MG PO TABS: 650.0000 mg | ORAL_TABLET | ORAL | Status: DC | PRN

## 2013-09-01 MED ORDER — OXYCODONE HCL 5 MG PO TABS
5.0000 mg | ORAL_TABLET | Freq: Once | ORAL | Status: AC | PRN
  Administered 2013-09-01: 5 mg via ORAL

## 2013-09-01 MED ORDER — PHENOL 1.4 % MT LIQD
1.0000 | OROMUCOSAL | Status: DC | PRN
  Administered 2013-09-02: 1 via OROMUCOSAL
  Filled 2013-09-01: qty 177

## 2013-09-01 MED ORDER — HYDROMORPHONE HCL PF 1 MG/ML IJ SOLN: 0.5000 mg | INTRAMUSCULAR | Status: DC | PRN

## 2013-09-01 MED ORDER — DIAZEPAM 5 MG PO TABS
ORAL_TABLET | ORAL | Status: AC
  Filled 2013-09-01: qty 1

## 2013-09-01 MED ORDER — PNEUMOCOCCAL VAC POLYVALENT 25 MCG/0.5ML IJ INJ
0.5000 mL | INJECTION | INTRAMUSCULAR | Status: DC
  Filled 2013-09-01: qty 0.5

## 2013-09-01 MED ORDER — BUPIVACAINE HCL (PF) 0.5 % IJ SOLN
INTRAMUSCULAR | Status: DC | PRN
  Administered 2013-09-01: 30 mL

## 2013-09-01 MED ORDER — 0.9 % SODIUM CHLORIDE (POUR BTL) OPTIME
TOPICAL | Status: DC | PRN
  Administered 2013-09-01: 1000 mL

## 2013-09-01 SURGICAL SUPPLY — 60 items
ADH SKN CLS APL DERMABOND .7 (GAUZE/BANDAGES/DRESSINGS) ×1
APL SKNCLS STERI-STRIP NONHPOA (GAUZE/BANDAGES/DRESSINGS)
BAG DECANTER FOR FLEXI CONT (MISCELLANEOUS) ×1 IMPLANT
BENZOIN TINCTURE PRP APPL 2/3 (GAUZE/BANDAGES/DRESSINGS) IMPLANT
BLADE SURG ROTATE 9660 (MISCELLANEOUS) IMPLANT
BUR MATCHSTICK NEURO 3.0 LAGG (BURR) ×2 IMPLANT
CANISTER SUCT 3000ML (MISCELLANEOUS) ×2 IMPLANT
CONT SPEC 4OZ CLIKSEAL STRL BL (MISCELLANEOUS) ×2 IMPLANT
DECANTER SPIKE VIAL GLASS SM (MISCELLANEOUS) ×1 IMPLANT
DERMABOND ADVANCED (GAUZE/BANDAGES/DRESSINGS) ×1
DERMABOND ADVANCED .7 DNX12 (GAUZE/BANDAGES/DRESSINGS) ×1 IMPLANT
DRAPE LAPAROTOMY 100X72X124 (DRAPES) ×2 IMPLANT
DRAPE MICROSCOPE LEICA (MISCELLANEOUS) ×2 IMPLANT
DRAPE POUCH INSTRU U-SHP 10X18 (DRAPES) ×2 IMPLANT
DRAPE SURG 17X23 STRL (DRAPES) ×2 IMPLANT
DURAPREP 26ML APPLICATOR (WOUND CARE) ×2 IMPLANT
ELECT REM PT RETURN 9FT ADLT (ELECTROSURGICAL) ×2
ELECTRODE REM PT RTRN 9FT ADLT (ELECTROSURGICAL) ×1 IMPLANT
GAUZE SPONGE 4X4 12PLY STRL (GAUZE/BANDAGES/DRESSINGS) IMPLANT
GAUZE SPONGE 4X4 16PLY XRAY LF (GAUZE/BANDAGES/DRESSINGS) IMPLANT
GLOVE BIOGEL PI IND STRL 7.0 (GLOVE) IMPLANT
GLOVE BIOGEL PI IND STRL 7.5 (GLOVE) IMPLANT
GLOVE BIOGEL PI INDICATOR 7.0 (GLOVE) ×1
GLOVE BIOGEL PI INDICATOR 7.5 (GLOVE) ×2
GLOVE ECLIPSE 6.5 STRL STRAW (GLOVE) ×1 IMPLANT
GLOVE EXAM NITRILE LRG STRL (GLOVE) IMPLANT
GLOVE EXAM NITRILE MD LF STRL (GLOVE) ×1 IMPLANT
GLOVE EXAM NITRILE XL STR (GLOVE) IMPLANT
GLOVE EXAM NITRILE XS STR PU (GLOVE) IMPLANT
GLOVE SURG SS PI 6.5 STRL IVOR (GLOVE) ×2 IMPLANT
GLOVE SURG SS PI 7.5 STRL IVOR (GLOVE) ×2 IMPLANT
GOWN STRL REUS W/ TWL LRG LVL3 (GOWN DISPOSABLE) ×2 IMPLANT
GOWN STRL REUS W/ TWL XL LVL3 (GOWN DISPOSABLE) IMPLANT
GOWN STRL REUS W/TWL 2XL LVL3 (GOWN DISPOSABLE) IMPLANT
GOWN STRL REUS W/TWL LRG LVL3 (GOWN DISPOSABLE) ×4
GOWN STRL REUS W/TWL XL LVL3 (GOWN DISPOSABLE) ×2
KIT BASIN OR (CUSTOM PROCEDURE TRAY) ×2 IMPLANT
KIT ROOM TURNOVER OR (KITS) ×2 IMPLANT
NDL HYPO 18GX1.5 BLUNT FILL (NEEDLE) IMPLANT
NDL HYPO 25X1 1.5 SAFETY (NEEDLE) ×1 IMPLANT
NDL SPNL 18GX3.5 QUINCKE PK (NEEDLE) IMPLANT
NEEDLE HYPO 18GX1.5 BLUNT FILL (NEEDLE) ×2 IMPLANT
NEEDLE HYPO 25X1 1.5 SAFETY (NEEDLE) ×2 IMPLANT
NEEDLE SPNL 18GX3.5 QUINCKE PK (NEEDLE) ×4 IMPLANT
NS IRRIG 1000ML POUR BTL (IV SOLUTION) ×2 IMPLANT
PACK LAMINECTOMY NEURO (CUSTOM PROCEDURE TRAY) ×2 IMPLANT
PAD ARMBOARD 7.5X6 YLW CONV (MISCELLANEOUS) ×6 IMPLANT
RUBBERBAND STERILE (MISCELLANEOUS) ×4 IMPLANT
SPONGE LAP 4X18 X RAY DECT (DISPOSABLE) IMPLANT
SPONGE SURGIFOAM ABS GEL SZ50 (HEMOSTASIS) ×2 IMPLANT
STRIP CLOSURE SKIN 1/2X4 (GAUZE/BANDAGES/DRESSINGS) IMPLANT
SUT VIC AB 0 CT1 18XCR BRD8 (SUTURE) ×1 IMPLANT
SUT VIC AB 0 CT1 8-18 (SUTURE) ×2
SUT VIC AB 2-0 CT1 18 (SUTURE) ×2 IMPLANT
SUT VIC AB 3-0 SH 8-18 (SUTURE) ×2 IMPLANT
SYR 20ML ECCENTRIC (SYRINGE) ×2 IMPLANT
SYR CONTROL 10ML LL (SYRINGE) ×1 IMPLANT
TOWEL OR 17X24 6PK STRL BLUE (TOWEL DISPOSABLE) ×2 IMPLANT
TOWEL OR 17X26 10 PK STRL BLUE (TOWEL DISPOSABLE) ×2 IMPLANT
WATER STERILE IRR 1000ML POUR (IV SOLUTION) ×2 IMPLANT

## 2013-09-01 NOTE — Anesthesia Postprocedure Evaluation (Signed)
  Anesthesia Post-op Note  Patient: James Dyer  Procedure(s) Performed: Procedure(s) with comments: RIGHT LUMBAR FOUR-FIVE LAMINECTOMY/DECOMPRESSION MICRODISCECTOMY 1 LEVEL (Right) - Right L45 microdiskectomy  Patient Location: PACU  Anesthesia Type: General   Level of Consciousness: awake, alert  and oriented  Airway and Oxygen Therapy: Patient Spontanous Breathing  Post-op Pain: mild  Post-op Assessment: Post-op Vital signs reviewed  Post-op Vital Signs: Reviewed  Last Vitals:  Filed Vitals:   09/01/13 2015  BP:   Pulse: 101  Temp:   Resp: 12    Complications: No apparent anesthesia complications

## 2013-09-01 NOTE — Anesthesia Preprocedure Evaluation (Signed)
Anesthesia Evaluation  Patient identified by MRN, date of birth, ID band Patient awake    Reviewed: Allergy & Precautions, H&P , NPO status , Patient's Chart, lab work & pertinent test results  Airway Mallampati: II  Neck ROM: full    Dental   Pulmonary shortness of breath, former smoker,          Cardiovascular hypertension,     Neuro/Psych  Neuromuscular disease    GI/Hepatic (+) Hepatitis -  Endo/Other  diabetes, Type 2Morbid obesity  Renal/GU      Musculoskeletal   Abdominal   Peds  Hematology   Anesthesia Other Findings   Reproductive/Obstetrics                           Anesthesia Physical Anesthesia Plan  ASA: II  Anesthesia Plan: General   Post-op Pain Management:    Induction: Intravenous  Airway Management Planned: Oral ETT  Additional Equipment:   Intra-op Plan:   Post-operative Plan: Extubation in OR  Informed Consent: I have reviewed the patients History and Physical, chart, labs and discussed the procedure including the risks, benefits and alternatives for the proposed anesthesia with the patient or authorized representative who has indicated his/her understanding and acceptance.     Plan Discussed with: CRNA, Anesthesiologist and Surgeon  Anesthesia Plan Comments:         Anesthesia Quick Evaluation

## 2013-09-01 NOTE — Op Note (Signed)
09/01/2013  7:27 PM  PATIENT:  James Dyer  68 y.o. 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 was taken to the operating room, intubated and placed under a general anesthetic without difficulty. He was positioned prone on a Wilson frame with all pressure points padded. His back was prepped and draped in a sterile manner. I opened the skin with a 10 blade and carried the dissection down to the thoracolumbar fascia. I used both sharp dissection and the monopolar cautery to expose the lamina of L4, and L5. I confirmed my location with an intraoperative xray.  I used the drill, Kerrison punches, and curettes to perform a semihemilaminectomy of L4. I used the punches to remove the ligamentum flavum to expose the thecal sac. I brought the microscope into the operative field. I  started the decompression of the spinal canal, thecal sac and L5 root(s). I cauterized epidural veins overlying the disc space then divided them sharply. I opened the fascial layer covering the disc fragment which was caudal to the disc space. I used pituitary rongeurs, curettes, and other instruments to remove disc material. After the discectomy was completed I inspected the L5 nerve root and felt it was well decompressed. I explored rostrally, laterally, medially, and caudally and was satisfied with the decompression. I irrigated the wound, then closed in layers. I approximated the thoracolumbar fascia, subcutaneous, and subcuticular planes with vicryl sutures. I used dermabond for a sterile dressing.   PLAN OF  CARE: Admit for overnight observation  PATIENT DISPOSITION:  PACU - hemodynamically stable.   Delay start of Pharmacological VTE agent (>24hrs) due to surgical blood loss or risk of bleeding:  yes

## 2013-09-01 NOTE — Transfer of Care (Signed)
Immediate Anesthesia Transfer of Care Note  Patient: James Dyer  Procedure(s) Performed: Procedure(s) with comments: RIGHT LUMBAR FOUR-FIVE LAMINECTOMY/DECOMPRESSION MICRODISCECTOMY 1 LEVEL (Right) - Right L45 microdiskectomy  Patient Location: PACU  Anesthesia Type:General  Level of Consciousness: awake, sedated and patient cooperative  Airway & Oxygen Therapy: Patient connected to nasal cannula oxygen  Post-op Assessment: Report given to PACU RN and Post -op Vital signs reviewed and stable  Post vital signs: Reviewed and stable  Complications: No apparent anesthesia complications

## 2013-09-02 DIAGNOSIS — M5126 Other intervertebral disc displacement, lumbar region: Secondary | ICD-10-CM | POA: Diagnosis not present

## 2013-09-02 LAB — GLUCOSE, CAPILLARY: GLUCOSE-CAPILLARY: 150 mg/dL — AB (ref 70–99)

## 2013-09-02 MED ORDER — HYDROCODONE-ACETAMINOPHEN 5-325 MG PO TABS: 1.0000 | ORAL_TABLET | Freq: Four times a day (QID) | ORAL | Status: DC | PRN

## 2013-09-02 MED ORDER — CYCLOBENZAPRINE HCL 10 MG PO TABS: 10.0000 mg | ORAL_TABLET | Freq: Three times a day (TID) | ORAL | Status: DC | PRN

## 2013-09-02 NOTE — Discharge Summary (Signed)
  Physician Discharge Summary  Patient ID: James Dyer MRN: [id number] DOB/AGE: Oct 16, 1945 68 y.o.  Admit date: 09/01/2013 Discharge date: 09/02/2013  Admission Diagnoses:HNP L4/5 right, right L5 radiculopathy  Discharge Diagnoses:  Active Problems:   HNP (herniated nucleus pulposus), lumbar   Discharged Condition: good  Hospital Course: Mr. Senft was admitted and taken to the operating room for an uncomplicated  Treatments: surgery: RIGHT LUMBAR FOUR-FIVE LAMINECTOMY/DECOMPRESSION MICRODISCECTOMY 1 LEVEL   Discharge Exam: Blood pressure 98/66, pulse 75, temperature 97.8 F (36.6 C), temperature source Oral, resp. rate 18, SpO2 93.00%. General appearance: alert, cooperative and appears stated age Neurologic: Alert and oriented X 3, normal strength and tone. Normal symmetric reflexes. Normal coordination and gait  Disposition: 01-Home or Self Care lumbar herniated disc    Medication List         amLODipine 5 MG tablet  Commonly known as:  NORVASC  Take 1 tablet (5 mg total) by mouth daily.     aspirin 81 MG tablet  Take 81 mg by mouth daily.     calcium carbonate 500 MG chewable tablet  Commonly known as:  TUMS - dosed in mg elemental calcium  Chew 1-2 tablets by mouth daily as needed for indigestion or heartburn.     cyclobenzaprine 10 MG tablet  Commonly known as:  FLEXERIL  Take 1 tablet (10 mg total) by mouth 3 (three) times daily as needed for muscle spasms.     enalapril 20 MG tablet  Commonly known as:  VASOTEC  Take 20 mg by mouth 2 (two) times daily.     HYDROcodone-acetaminophen 5-325 MG per tablet  Commonly known as:  NORCO/VICODIN  Take 1 tablet by mouth every 6 (six) hours as needed for moderate pain.     metFORMIN 500 MG tablet  Commonly known as:  GLUCOPHAGE  Take 500 mg by mouth 2 (two) times daily with a meal.     nitroGLYCERIN 0.4 MG SL tablet  Commonly known as:  NITROSTAT  Place 0.4 mg under the tongue every 5 (five) minutes as needed  for chest pain.     oxyCODONE 5 MG immediate release tablet  Commonly known as:  ROXICODONE  Take 1-2 tablets (5-10 mg total) by mouth every 3 (three) hours as needed for pain.     sildenafil 20 MG tablet  Commonly known as:  REVATIO  Take 20 mg by mouth daily as needed. erectile dysfunction     sodium chloride 0.65 % nasal spray  Commonly known as:  OCEAN  Place 1 spray into the nose 2 (two) times daily as needed for congestion.     tamsulosin 0.4 MG Caps capsule  Commonly known as:  FLOMAX  Take 0.4 mg by mouth every other day.           Follow-up Information   Follow up with Ruven Corradi L, MD In 3 weeks. (call office to make an appointment)    Specialty:  Neurosurgery   Contact information:   [place] [id number] [phone]       Signed: Glenora Morocho L 09/02/2013, 10:26 AM

## 2013-09-02 NOTE — Progress Notes (Signed)
Patient alert and oriented, mae's well, voiding adequate amount of urine, swallowing without difficulty, no c/o pain. Patient discharged home with family. Script and discharged instructions given to patient. Patient and family stated understanding of instructions given.  

## 2013-09-02 NOTE — Discharge Instructions (Signed)
Lumbar Discectomy °Care After °A discectomy involves removal of discmaterial (the cartilage-like structures located between the bones of the back). It is done to relieve pressure on nerve roots. It can be used as a treatment for a back problem. The time in surgery depends on the findings in surgery and what is necessary to correct the problems. °HOME CARE INSTRUCTIONS  °· Check the cut (incision) made by the surgeon twice a day for signs of infection. Some signs of infection may include:  °· A foul smelling, greenish or yellowish discharge from the wound.  °· Increased pain.  °· Increased redness over the incision (operative) site.  °· The skin edges may separate.  °· Flu-like symptoms (problems).  °· A temperature above 101.5° F (38.6° C).  °· Change your bandages in about 24 to 36 hours following surgery or as directed.  °· You may shower tomrrow.  Avoid bathtubs, swimming pools and hot tubs for three weeks or until your incision has healed completely. °· Follow your doctor's instructions as to safe activities, exercises, and physical therapy.  °· Weight reduction may be beneficial if you are overweight.  °· Daily exercise is helpful to prevent the return of problems. Walking is permitted. You may use a treadmill without an incline. Cut down on activities and exercise if you have discomfort. You may also go up and down stairs as much as you can tolerate.  °· DO NOT lift anything heavier than 10 to 15 lbs. Avoid bending or twisting at the waist. Always bend your knees when lifting.  °· Maintain strength and range of motion as instructed.  °· Do not drive for 10 days, or as directed by your doctors. You may be a passenger . Lying back in the passenger seat may be more comfortable for you. Always wear a seatbelt.  °· Limit your sitting in a regular chair to 20 to 30 minutes at a time. There are no limitations for sitting in a recliner. You should lie down or walk in between sitting periods.  °· Only take  over-the-counter or prescription medicines for pain, discomfort, or fever as directed by your caregiver.  °SEEK MEDICAL CARE IF:  °· There is increased bleeding (more than a small spot) from the wound.  °· You notice redness, swelling, or increasing pain in the wound.  °· Pus is coming from wound.  °· You develop an unexplained oral temperature above 102° F (38.9° C) develops.  °· You notice a foul smell coming from the wound or dressing.  °· You have increasing pain in your wound.  °SEEK IMMEDIATE MEDICAL CARE IF:  °· You develop a rash.  °· You have difficulty breathing.  °· You develop any allergic problems to medicines given.  °Document Released: 12/21/2003 Document Revised: 01/04/2011 Document Reviewed: 04/10/2007 °ExitCare® Patient Information ° ° ° °Wound Care °Leave incision open to air. °You may shower. °Do not scrub directly on incision.  °Do not put any creams, lotions, or ointments on incision. °Activity °Walk each and every day, increasing distance each day. °No lifting greater than 5 lbs.  Avoid bending, arching, and twisting. °No driving for 2 weeks; may ride as a passenger locally. °If provided with back brace, wear when out of bed.  It is not necessary to wear in bed. °Diet °Resume your normal diet.  °Return to Work °Will be discussed at you follow up appointment. °Call Your Doctor If Any of These Occur °Redness, drainage, or swelling at the wound.  °Temperature greater than   101 degrees. °Severe pain not relieved by pain medication. °Incision starts to come apart. °Follow Up Appt °Call today for appointment in 4 weeks ([phone]) or for problems.  If you have any hardware placed in your spine, you will need an x-ray before your appointment. °

## 2013-09-03 ENCOUNTER — Encounter (HOSPITAL_COMMUNITY): Payer: Self-pay | Admitting: Neurosurgery

## 2013-09-14 NOTE — Addendum Note (Signed)
Addendum created 09/14/13 1059 by Napoleon Form, MD   Modules edited: Anesthesia Attestations

## 2014-01-05 ENCOUNTER — Ambulatory Visit (INDEPENDENT_AMBULATORY_CARE_PROVIDER_SITE_OTHER): Payer: Medicare Other | Admitting: Urology

## 2014-01-05 DIAGNOSIS — C61 Malignant neoplasm of prostate: Secondary | ICD-10-CM

## 2014-01-05 DIAGNOSIS — R972 Elevated prostate specific antigen [PSA]: Secondary | ICD-10-CM | POA: Diagnosis not present

## 2014-02-19 DIAGNOSIS — E782 Mixed hyperlipidemia: Secondary | ICD-10-CM | POA: Diagnosis not present

## 2014-02-19 DIAGNOSIS — Z6836 Body mass index (BMI) 36.0-36.9, adult: Secondary | ICD-10-CM | POA: Diagnosis not present

## 2014-02-19 DIAGNOSIS — E119 Type 2 diabetes mellitus without complications: Secondary | ICD-10-CM | POA: Diagnosis not present

## 2014-02-19 DIAGNOSIS — I10 Essential (primary) hypertension: Secondary | ICD-10-CM | POA: Diagnosis not present

## 2014-02-22 DIAGNOSIS — I10 Essential (primary) hypertension: Secondary | ICD-10-CM | POA: Diagnosis not present

## 2014-02-22 DIAGNOSIS — Z6836 Body mass index (BMI) 36.0-36.9, adult: Secondary | ICD-10-CM | POA: Diagnosis not present

## 2014-02-22 DIAGNOSIS — E782 Mixed hyperlipidemia: Secondary | ICD-10-CM | POA: Diagnosis not present

## 2014-02-22 DIAGNOSIS — E119 Type 2 diabetes mellitus without complications: Secondary | ICD-10-CM | POA: Diagnosis not present

## 2014-04-26 DIAGNOSIS — H524 Presbyopia: Secondary | ICD-10-CM | POA: Diagnosis not present

## 2014-04-26 DIAGNOSIS — H40021 Open angle with borderline findings, high risk, right eye: Secondary | ICD-10-CM | POA: Diagnosis not present

## 2014-04-26 DIAGNOSIS — H52223 Regular astigmatism, bilateral: Secondary | ICD-10-CM | POA: Diagnosis not present

## 2014-04-26 DIAGNOSIS — H25093 Other age-related incipient cataract, bilateral: Secondary | ICD-10-CM | POA: Diagnosis not present

## 2014-07-06 ENCOUNTER — Ambulatory Visit (INDEPENDENT_AMBULATORY_CARE_PROVIDER_SITE_OTHER): Payer: Medicare Other | Admitting: Urology

## 2014-07-06 DIAGNOSIS — C61 Malignant neoplasm of prostate: Secondary | ICD-10-CM | POA: Diagnosis not present

## 2014-07-06 DIAGNOSIS — N5201 Erectile dysfunction due to arterial insufficiency: Secondary | ICD-10-CM | POA: Diagnosis not present

## 2014-07-06 DIAGNOSIS — N401 Enlarged prostate with lower urinary tract symptoms: Secondary | ICD-10-CM | POA: Diagnosis not present

## 2014-10-06 IMAGING — CR DG SHOULDER 2+V*L*
3 series · 3 of 3 positions shown · non-contrast
Comparison: None.

CLINICAL DATA: History of injury from fall.  History of pain.

LEFT SHOULDER - 2+ VIEW

[view not recorded (1 of 3)]
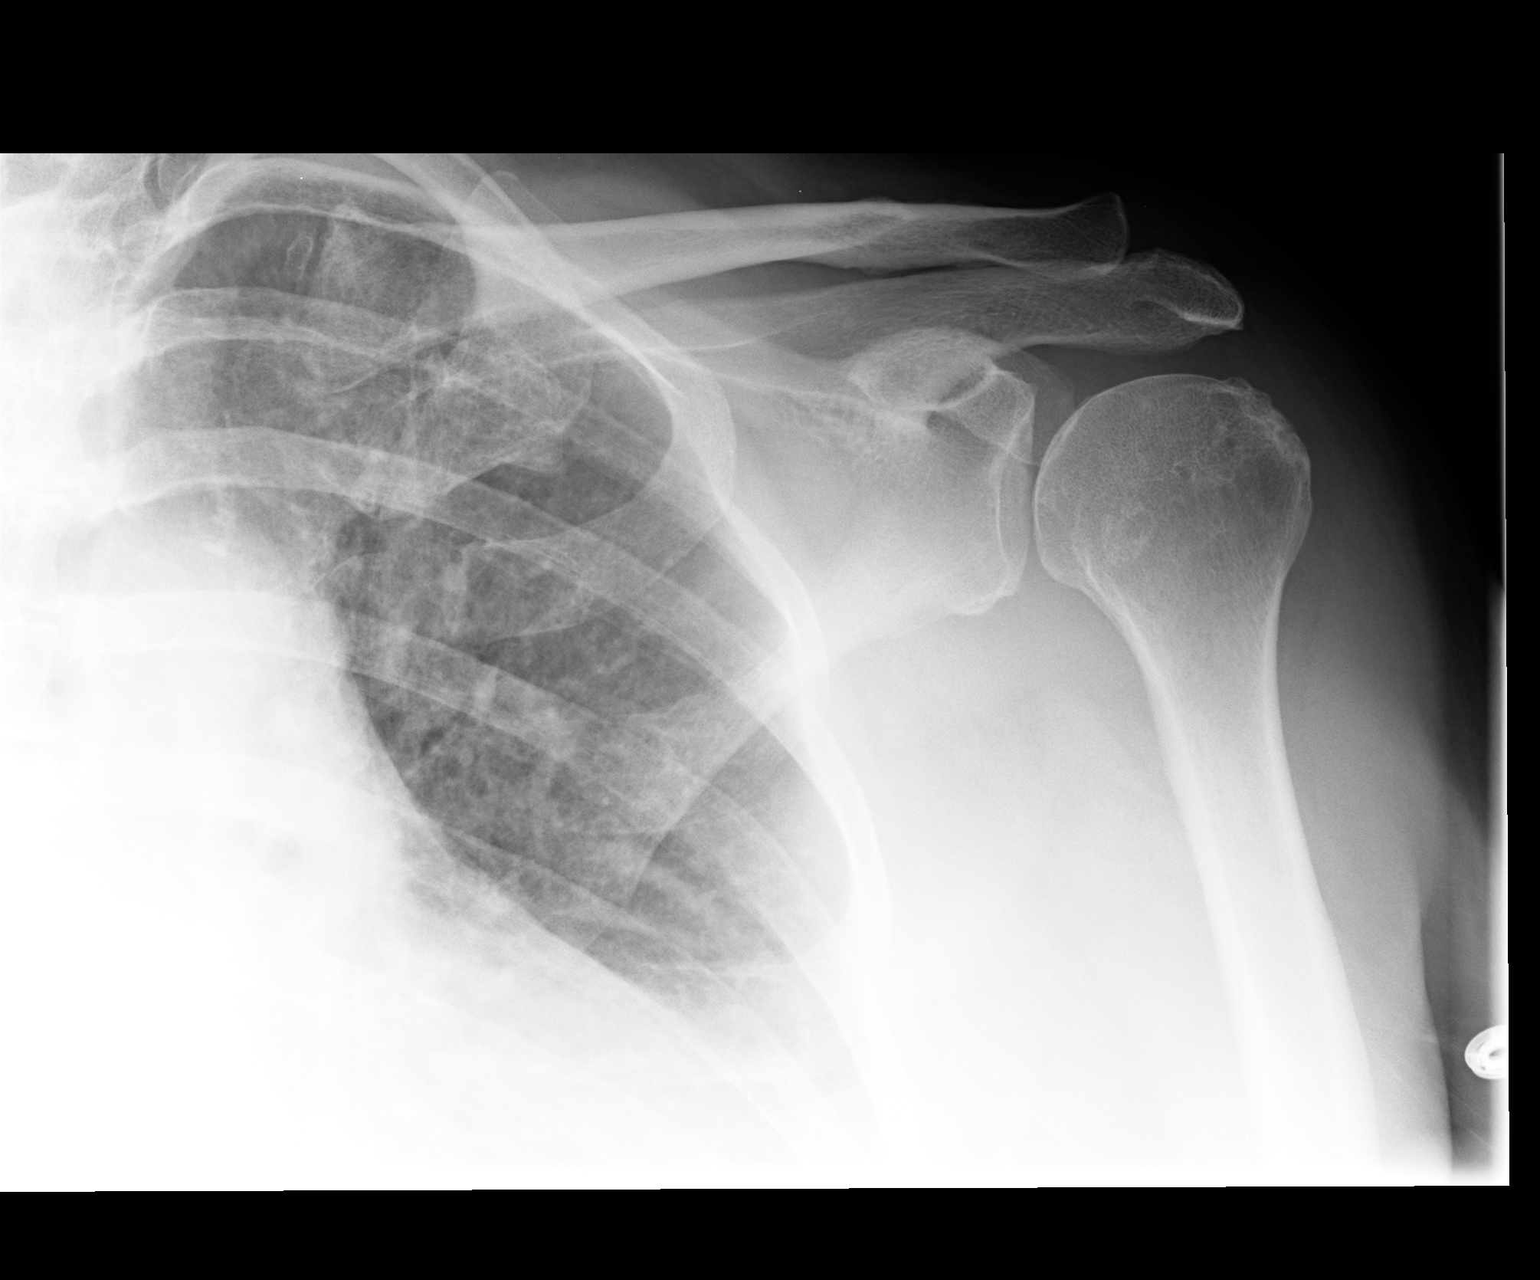

[view not recorded (2 of 3)]
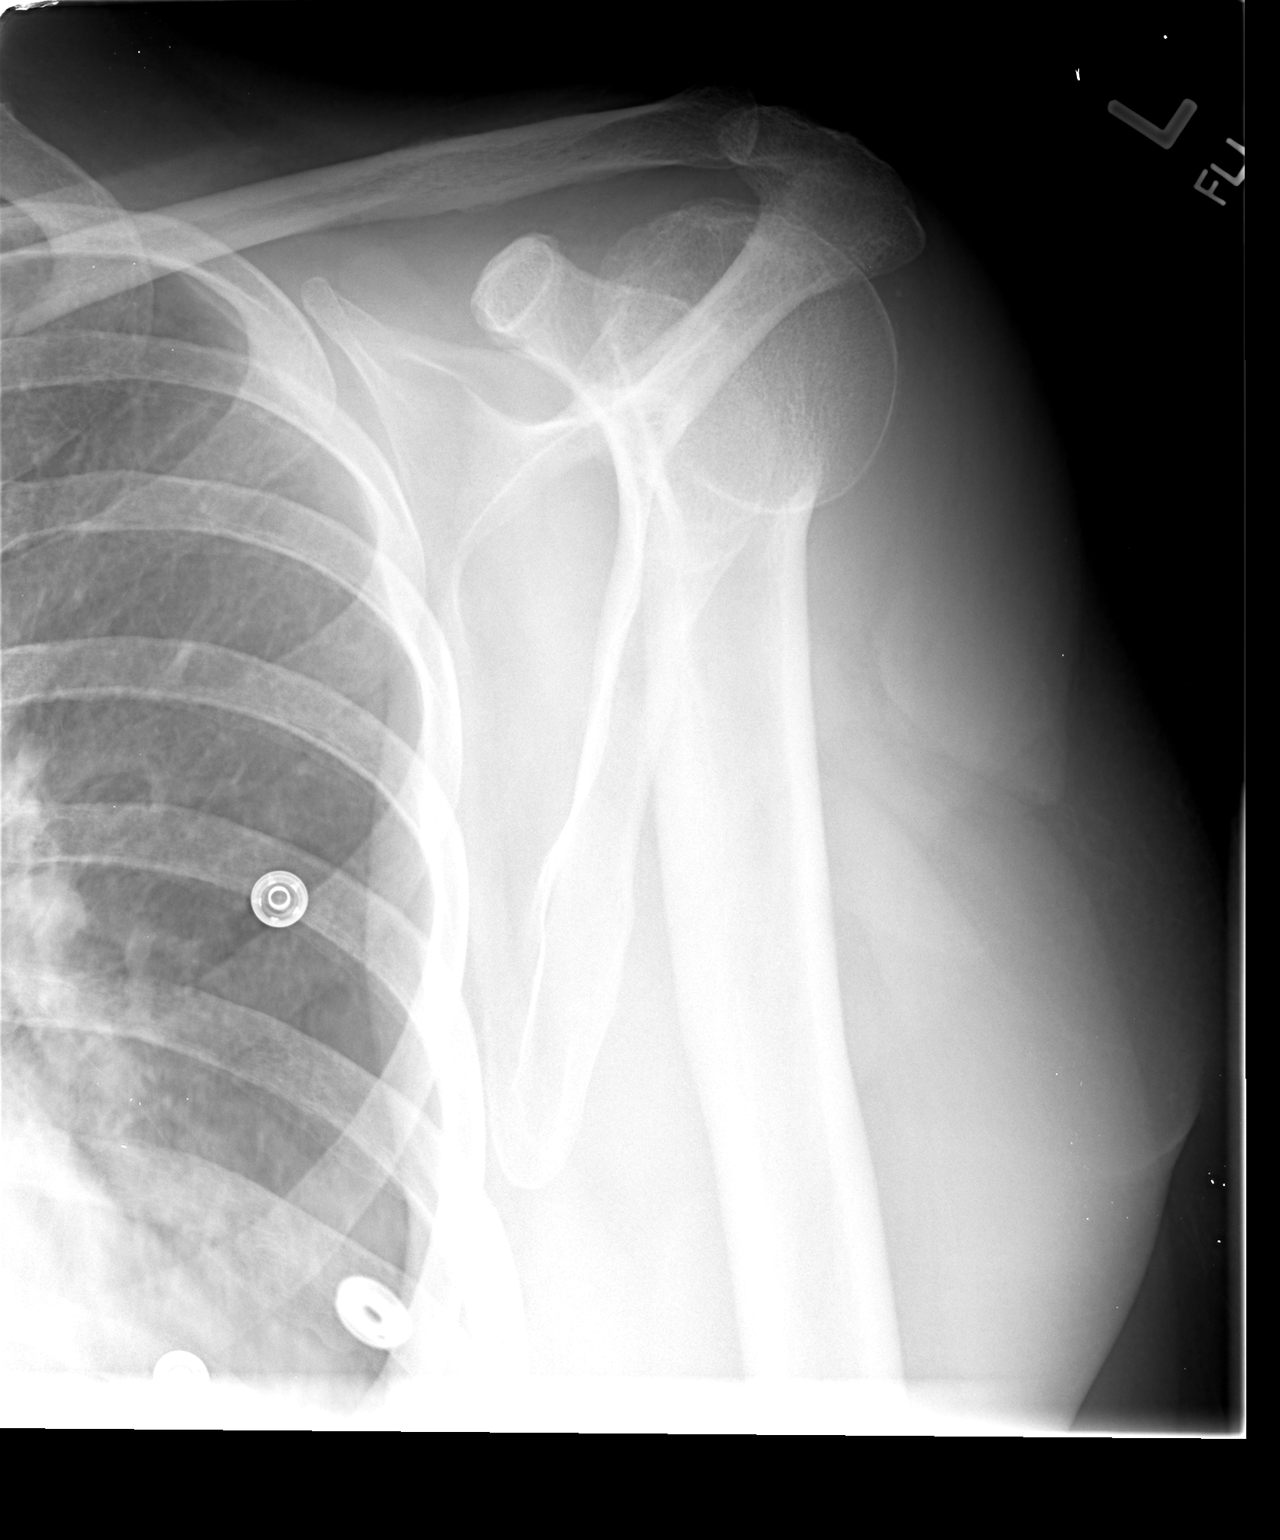

[view not recorded (3 of 3)]
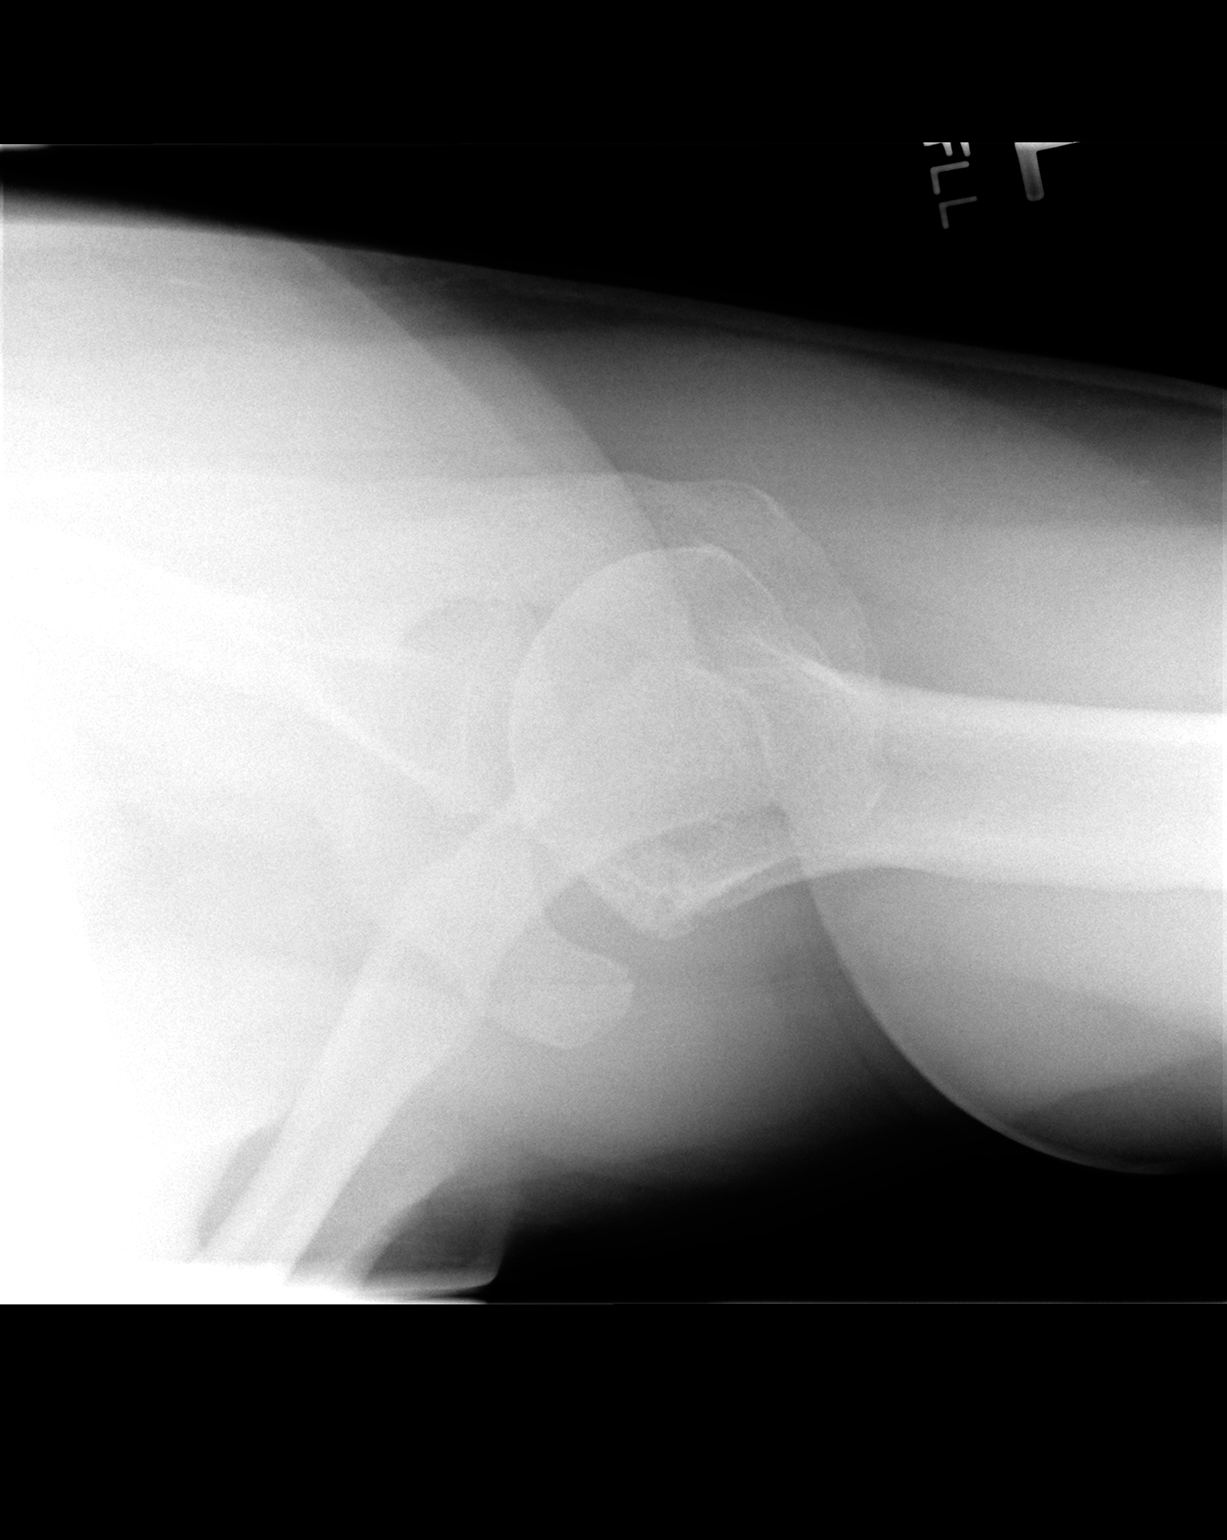

[3 of 3 positions shown; findings below may reference images not displayed]

FINDINGS: Alignment is normal.  Joint spaces are preserved.  No
fracture or dislocation is evident.  No soft tissue lesions are
seen.  There is degenerative roughening of the greater tuberosity
of the humeral head.
IMPRESSION: No fracture or dislocation is seen.  Degenerative roughening of the
greater tuberosity humeral head.  Slight congenital downsloping of
the acromion process.

## 2015-03-03 DIAGNOSIS — Z6838 Body mass index (BMI) 38.0-38.9, adult: Secondary | ICD-10-CM | POA: Diagnosis not present

## 2015-03-03 DIAGNOSIS — Z1389 Encounter for screening for other disorder: Secondary | ICD-10-CM | POA: Diagnosis not present

## 2015-03-03 DIAGNOSIS — L309 Dermatitis, unspecified: Secondary | ICD-10-CM | POA: Diagnosis not present

## 2015-03-03 DIAGNOSIS — L308 Other specified dermatitis: Secondary | ICD-10-CM | POA: Diagnosis not present

## 2015-03-22 DIAGNOSIS — Z1389 Encounter for screening for other disorder: Secondary | ICD-10-CM | POA: Diagnosis not present

## 2015-03-22 DIAGNOSIS — E782 Mixed hyperlipidemia: Secondary | ICD-10-CM | POA: Diagnosis not present

## 2015-03-22 DIAGNOSIS — R5383 Other fatigue: Secondary | ICD-10-CM | POA: Diagnosis not present

## 2015-03-22 DIAGNOSIS — Z6838 Body mass index (BMI) 38.0-38.9, adult: Secondary | ICD-10-CM | POA: Diagnosis not present

## 2015-03-22 DIAGNOSIS — Z23 Encounter for immunization: Secondary | ICD-10-CM | POA: Diagnosis not present

## 2015-03-22 DIAGNOSIS — E119 Type 2 diabetes mellitus without complications: Secondary | ICD-10-CM | POA: Diagnosis not present

## 2015-03-22 DIAGNOSIS — I10 Essential (primary) hypertension: Secondary | ICD-10-CM | POA: Diagnosis not present

## 2015-04-19 DIAGNOSIS — Z1389 Encounter for screening for other disorder: Secondary | ICD-10-CM | POA: Diagnosis not present

## 2015-04-19 DIAGNOSIS — Z6838 Body mass index (BMI) 38.0-38.9, adult: Secondary | ICD-10-CM | POA: Diagnosis not present

## 2015-04-19 DIAGNOSIS — Z0000 Encounter for general adult medical examination without abnormal findings: Secondary | ICD-10-CM | POA: Diagnosis not present

## 2015-04-26 ENCOUNTER — Ambulatory Visit (INDEPENDENT_AMBULATORY_CARE_PROVIDER_SITE_OTHER): Payer: Medicare Other | Admitting: Urology

## 2015-04-26 DIAGNOSIS — N3281 Overactive bladder: Secondary | ICD-10-CM

## 2015-04-26 DIAGNOSIS — N390 Urinary tract infection, site not specified: Secondary | ICD-10-CM | POA: Diagnosis not present

## 2015-04-26 DIAGNOSIS — C61 Malignant neoplasm of prostate: Secondary | ICD-10-CM | POA: Diagnosis not present

## 2015-06-20 DIAGNOSIS — E119 Type 2 diabetes mellitus without complications: Secondary | ICD-10-CM | POA: Diagnosis not present

## 2015-06-20 DIAGNOSIS — H52223 Regular astigmatism, bilateral: Secondary | ICD-10-CM | POA: Diagnosis not present

## 2015-06-20 DIAGNOSIS — H40021 Open angle with borderline findings, high risk, right eye: Secondary | ICD-10-CM | POA: Diagnosis not present

## 2015-06-20 DIAGNOSIS — H25093 Other age-related incipient cataract, bilateral: Secondary | ICD-10-CM | POA: Diagnosis not present

## 2015-06-21 DIAGNOSIS — R3129 Other microscopic hematuria: Secondary | ICD-10-CM | POA: Diagnosis not present

## 2015-07-05 DIAGNOSIS — I10 Essential (primary) hypertension: Secondary | ICD-10-CM | POA: Diagnosis not present

## 2015-07-05 DIAGNOSIS — E782 Mixed hyperlipidemia: Secondary | ICD-10-CM | POA: Diagnosis not present

## 2015-07-05 DIAGNOSIS — E119 Type 2 diabetes mellitus without complications: Secondary | ICD-10-CM | POA: Diagnosis not present

## 2015-07-05 DIAGNOSIS — Z1389 Encounter for screening for other disorder: Secondary | ICD-10-CM | POA: Diagnosis not present

## 2015-07-05 DIAGNOSIS — Z6836 Body mass index (BMI) 36.0-36.9, adult: Secondary | ICD-10-CM | POA: Diagnosis not present

## 2015-11-29 DIAGNOSIS — M21379 Foot drop, unspecified foot: Secondary | ICD-10-CM | POA: Diagnosis not present

## 2015-11-29 DIAGNOSIS — E782 Mixed hyperlipidemia: Secondary | ICD-10-CM | POA: Diagnosis not present

## 2015-11-29 DIAGNOSIS — Z1389 Encounter for screening for other disorder: Secondary | ICD-10-CM | POA: Diagnosis not present

## 2015-11-29 DIAGNOSIS — Z6833 Body mass index (BMI) 33.0-33.9, adult: Secondary | ICD-10-CM | POA: Diagnosis not present

## 2015-11-29 DIAGNOSIS — Z23 Encounter for immunization: Secondary | ICD-10-CM | POA: Diagnosis not present

## 2015-11-29 DIAGNOSIS — E6609 Other obesity due to excess calories: Secondary | ICD-10-CM | POA: Diagnosis not present

## 2015-11-29 DIAGNOSIS — E119 Type 2 diabetes mellitus without complications: Secondary | ICD-10-CM | POA: Diagnosis not present

## 2015-11-29 DIAGNOSIS — I10 Essential (primary) hypertension: Secondary | ICD-10-CM | POA: Diagnosis not present

## 2016-03-30 DIAGNOSIS — M21379 Foot drop, unspecified foot: Secondary | ICD-10-CM | POA: Diagnosis not present

## 2016-03-30 DIAGNOSIS — Z1389 Encounter for screening for other disorder: Secondary | ICD-10-CM | POA: Diagnosis not present

## 2016-03-30 DIAGNOSIS — Z6835 Body mass index (BMI) 35.0-35.9, adult: Secondary | ICD-10-CM | POA: Diagnosis not present

## 2016-03-30 DIAGNOSIS — E6609 Other obesity due to excess calories: Secondary | ICD-10-CM | POA: Diagnosis not present

## 2016-03-30 DIAGNOSIS — C61 Malignant neoplasm of prostate: Secondary | ICD-10-CM | POA: Diagnosis not present

## 2016-03-30 DIAGNOSIS — E782 Mixed hyperlipidemia: Secondary | ICD-10-CM | POA: Diagnosis not present

## 2016-03-30 DIAGNOSIS — E119 Type 2 diabetes mellitus without complications: Secondary | ICD-10-CM | POA: Diagnosis not present

## 2016-04-17 ENCOUNTER — Other Ambulatory Visit (HOSPITAL_COMMUNITY)
Admission: RE | Admit: 2016-04-17 | Discharge: 2016-04-17 | Disposition: A | Discharge status: Home / Self Care | Payer: Medicare Other | Source: Other Acute Inpatient Hospital | Attending: Urology | Admitting: Urology

## 2016-04-17 ENCOUNTER — Ambulatory Visit (INDEPENDENT_AMBULATORY_CARE_PROVIDER_SITE_OTHER): Payer: Medicare Other | Admitting: Urology

## 2016-04-17 DIAGNOSIS — C61 Malignant neoplasm of prostate: Secondary | ICD-10-CM | POA: Diagnosis not present

## 2016-04-17 DIAGNOSIS — N3281 Overactive bladder: Secondary | ICD-10-CM

## 2016-04-17 DIAGNOSIS — N5201 Erectile dysfunction due to arterial insufficiency: Secondary | ICD-10-CM

## 2016-04-17 DIAGNOSIS — R311 Benign essential microscopic hematuria: Secondary | ICD-10-CM | POA: Insufficient documentation

## 2016-04-17 LAB — URINALYSIS, COMPLETE (UACMP) WITH MICROSCOPIC
Bacteria, UA: NONE SEEN
Bilirubin Urine: NEGATIVE
GLUCOSE, UA: NEGATIVE mg/dL
Ketones, ur: NEGATIVE mg/dL
Leukocytes, UA: NEGATIVE
Nitrite: NEGATIVE
Protein, ur: NEGATIVE mg/dL
Specific Gravity, Urine: 1.021 (ref 1.005–1.030)
pH: 6 (ref 5.0–8.0)

## 2016-05-17 ENCOUNTER — Other Ambulatory Visit: Payer: Self-pay | Admitting: Urology

## 2016-05-17 DIAGNOSIS — R3129 Other microscopic hematuria: Secondary | ICD-10-CM

## 2016-05-29 ENCOUNTER — Ambulatory Visit (HOSPITAL_COMMUNITY)
Admission: RE | Admit: 2016-05-29 | Discharge: 2016-05-29 | Disposition: A | Discharge status: Home / Self Care | Payer: Medicare Other | Source: Ambulatory Visit | Attending: Urology | Admitting: Urology

## 2016-05-29 DIAGNOSIS — N281 Cyst of kidney, acquired: Secondary | ICD-10-CM | POA: Insufficient documentation

## 2016-05-29 DIAGNOSIS — N200 Calculus of kidney: Secondary | ICD-10-CM | POA: Diagnosis not present

## 2016-05-29 DIAGNOSIS — R3129 Other microscopic hematuria: Secondary | ICD-10-CM

## 2016-05-29 LAB — POCT I-STAT, CHEM 8
BUN: 21 mg/dL — AB (ref 6–20)
Calcium, Ion: 1.15 mmol/L (ref 1.15–1.40)
Chloride: 103 mmol/L (ref 101–111)
Creatinine, Ser: 0.9 mg/dL (ref 0.61–1.24)
Glucose, Bld: 111 mg/dL — ABNORMAL HIGH (ref 65–99)
HEMATOCRIT: 41 % (ref 39.0–52.0)
Hemoglobin: 13.9 g/dL (ref 13.0–17.0)
Potassium: 3.7 mmol/L (ref 3.5–5.1)
SODIUM: 140 mmol/L (ref 135–145)
TCO2: 26 mmol/L (ref 0–100)

## 2016-05-29 MED ORDER — IOPAMIDOL (ISOVUE-300) INJECTION 61%
150.0000 mL | Freq: Once | INTRAVENOUS | Status: AC | PRN
  Administered 2016-05-29: 150 mL via INTRAVENOUS

## 2016-06-05 ENCOUNTER — Ambulatory Visit (INDEPENDENT_AMBULATORY_CARE_PROVIDER_SITE_OTHER): Payer: Medicare Other | Admitting: Urology

## 2016-06-05 DIAGNOSIS — R311 Benign essential microscopic hematuria: Secondary | ICD-10-CM | POA: Diagnosis not present

## 2016-06-05 DIAGNOSIS — N3281 Overactive bladder: Secondary | ICD-10-CM | POA: Diagnosis not present

## 2016-06-05 DIAGNOSIS — C61 Malignant neoplasm of prostate: Secondary | ICD-10-CM | POA: Diagnosis not present

## 2016-06-18 DIAGNOSIS — H40021 Open angle with borderline findings, high risk, right eye: Secondary | ICD-10-CM | POA: Diagnosis not present

## 2016-06-18 DIAGNOSIS — E119 Type 2 diabetes mellitus without complications: Secondary | ICD-10-CM | POA: Diagnosis not present

## 2016-06-18 DIAGNOSIS — H52223 Regular astigmatism, bilateral: Secondary | ICD-10-CM | POA: Diagnosis not present

## 2016-06-18 DIAGNOSIS — H25093 Other age-related incipient cataract, bilateral: Secondary | ICD-10-CM | POA: Diagnosis not present

## 2016-06-22 DIAGNOSIS — Z6835 Body mass index (BMI) 35.0-35.9, adult: Secondary | ICD-10-CM | POA: Diagnosis not present

## 2016-06-22 DIAGNOSIS — M5412 Radiculopathy, cervical region: Secondary | ICD-10-CM | POA: Diagnosis not present

## 2016-06-22 DIAGNOSIS — M542 Cervicalgia: Secondary | ICD-10-CM | POA: Diagnosis not present

## 2016-07-09 DIAGNOSIS — E119 Type 2 diabetes mellitus without complications: Secondary | ICD-10-CM | POA: Diagnosis not present

## 2016-07-09 DIAGNOSIS — M21379 Foot drop, unspecified foot: Secondary | ICD-10-CM | POA: Diagnosis not present

## 2016-07-09 DIAGNOSIS — I10 Essential (primary) hypertension: Secondary | ICD-10-CM | POA: Diagnosis not present

## 2016-07-09 DIAGNOSIS — E782 Mixed hyperlipidemia: Secondary | ICD-10-CM | POA: Diagnosis not present

## 2016-07-09 DIAGNOSIS — Z6835 Body mass index (BMI) 35.0-35.9, adult: Secondary | ICD-10-CM | POA: Diagnosis not present

## 2016-12-03 DIAGNOSIS — Z23 Encounter for immunization: Secondary | ICD-10-CM | POA: Diagnosis not present

## 2016-12-03 DIAGNOSIS — Z1389 Encounter for screening for other disorder: Secondary | ICD-10-CM | POA: Diagnosis not present

## 2016-12-03 DIAGNOSIS — Z6836 Body mass index (BMI) 36.0-36.9, adult: Secondary | ICD-10-CM | POA: Diagnosis not present

## 2016-12-03 DIAGNOSIS — E119 Type 2 diabetes mellitus without complications: Secondary | ICD-10-CM | POA: Diagnosis not present

## 2016-12-03 DIAGNOSIS — Z125 Encounter for screening for malignant neoplasm of prostate: Secondary | ICD-10-CM | POA: Diagnosis not present

## 2016-12-03 DIAGNOSIS — I10 Essential (primary) hypertension: Secondary | ICD-10-CM | POA: Diagnosis not present

## 2016-12-03 DIAGNOSIS — E6609 Other obesity due to excess calories: Secondary | ICD-10-CM | POA: Diagnosis not present

## 2016-12-03 DIAGNOSIS — C61 Malignant neoplasm of prostate: Secondary | ICD-10-CM | POA: Diagnosis not present

## 2016-12-03 DIAGNOSIS — N400 Enlarged prostate without lower urinary tract symptoms: Secondary | ICD-10-CM | POA: Diagnosis not present

## 2016-12-03 DIAGNOSIS — E782 Mixed hyperlipidemia: Secondary | ICD-10-CM | POA: Diagnosis not present

## 2016-12-19 DIAGNOSIS — J019 Acute sinusitis, unspecified: Secondary | ICD-10-CM | POA: Diagnosis not present

## 2016-12-19 DIAGNOSIS — Z6836 Body mass index (BMI) 36.0-36.9, adult: Secondary | ICD-10-CM | POA: Diagnosis not present

## 2016-12-19 DIAGNOSIS — E6609 Other obesity due to excess calories: Secondary | ICD-10-CM | POA: Diagnosis not present

## 2016-12-19 DIAGNOSIS — J029 Acute pharyngitis, unspecified: Secondary | ICD-10-CM | POA: Diagnosis not present

## 2016-12-19 DIAGNOSIS — M545 Low back pain: Secondary | ICD-10-CM | POA: Diagnosis not present

## 2016-12-19 DIAGNOSIS — R310 Gross hematuria: Secondary | ICD-10-CM | POA: Diagnosis not present

## 2016-12-19 DIAGNOSIS — Z1389 Encounter for screening for other disorder: Secondary | ICD-10-CM | POA: Diagnosis not present

## 2016-12-24 DIAGNOSIS — N5201 Erectile dysfunction due to arterial insufficiency: Secondary | ICD-10-CM | POA: Diagnosis not present

## 2016-12-24 DIAGNOSIS — C61 Malignant neoplasm of prostate: Secondary | ICD-10-CM | POA: Diagnosis not present

## 2016-12-24 DIAGNOSIS — R310 Gross hematuria: Secondary | ICD-10-CM | POA: Diagnosis not present

## 2016-12-24 DIAGNOSIS — R311 Benign essential microscopic hematuria: Secondary | ICD-10-CM | POA: Diagnosis not present

## 2017-01-18 ENCOUNTER — Emergency Department (HOSPITAL_COMMUNITY): Payer: Medicare Other

## 2017-01-18 ENCOUNTER — Emergency Department (HOSPITAL_COMMUNITY)
Admission: EM | Admit: 2017-01-18 | Discharge: 2017-01-18 | Disposition: A | Discharge status: Home / Self Care | Payer: Medicare Other | Attending: Emergency Medicine | Admitting: Emergency Medicine

## 2017-01-18 ENCOUNTER — Other Ambulatory Visit: Payer: Self-pay

## 2017-01-18 ENCOUNTER — Encounter (HOSPITAL_COMMUNITY): Payer: Self-pay

## 2017-01-18 DIAGNOSIS — N23 Unspecified renal colic: Secondary | ICD-10-CM | POA: Diagnosis not present

## 2017-01-18 DIAGNOSIS — N132 Hydronephrosis with renal and ureteral calculous obstruction: Secondary | ICD-10-CM | POA: Diagnosis not present

## 2017-01-18 DIAGNOSIS — I10 Essential (primary) hypertension: Secondary | ICD-10-CM | POA: Diagnosis not present

## 2017-01-18 DIAGNOSIS — R319 Hematuria, unspecified: Secondary | ICD-10-CM | POA: Insufficient documentation

## 2017-01-18 DIAGNOSIS — Z79899 Other long term (current) drug therapy: Secondary | ICD-10-CM | POA: Diagnosis not present

## 2017-01-18 DIAGNOSIS — Z8546 Personal history of malignant neoplasm of prostate: Secondary | ICD-10-CM | POA: Diagnosis not present

## 2017-01-18 DIAGNOSIS — Z87891 Personal history of nicotine dependence: Secondary | ICD-10-CM | POA: Insufficient documentation

## 2017-01-18 DIAGNOSIS — R1032 Left lower quadrant pain: Secondary | ICD-10-CM | POA: Diagnosis present

## 2017-01-18 DIAGNOSIS — E119 Type 2 diabetes mellitus without complications: Secondary | ICD-10-CM | POA: Diagnosis not present

## 2017-01-18 DIAGNOSIS — Z7982 Long term (current) use of aspirin: Secondary | ICD-10-CM | POA: Diagnosis not present

## 2017-01-18 DIAGNOSIS — Z7984 Long term (current) use of oral hypoglycemic drugs: Secondary | ICD-10-CM | POA: Diagnosis not present

## 2017-01-18 LAB — CBC WITH DIFFERENTIAL/PLATELET
Basophils Absolute: 0 10*3/uL (ref 0.0–0.1)
Basophils Relative: 0 %
Eosinophils Absolute: 0.4 10*3/uL (ref 0.0–0.7)
Eosinophils Relative: 6 %
HCT: 40.1 % (ref 39.0–52.0)
Hemoglobin: 13 g/dL (ref 13.0–17.0)
LYMPHS ABS: 2 10*3/uL (ref 0.7–4.0)
LYMPHS PCT: 26 %
MCH: 29.3 pg (ref 26.0–34.0)
MCHC: 32.4 g/dL (ref 30.0–36.0)
MCV: 90.5 fL (ref 78.0–100.0)
Monocytes Absolute: 0.6 10*3/uL (ref 0.1–1.0)
Monocytes Relative: 8 %
Neutro Abs: 4.5 10*3/uL (ref 1.7–7.7)
Neutrophils Relative %: 60 %
PLATELETS: 230 10*3/uL (ref 150–400)
RBC: 4.43 MIL/uL (ref 4.22–5.81)
RDW: 12.8 % (ref 11.5–15.5)
WBC: 7.5 10*3/uL (ref 4.0–10.5)

## 2017-01-18 LAB — URINALYSIS, ROUTINE W REFLEX MICROSCOPIC
Bacteria, UA: NONE SEEN
Bilirubin Urine: NEGATIVE
Glucose, UA: NEGATIVE mg/dL
Ketones, ur: NEGATIVE mg/dL
Leukocytes, UA: NEGATIVE
Nitrite: NEGATIVE
PROTEIN: NEGATIVE mg/dL
SQUAMOUS EPITHELIAL / LPF: NONE SEEN
Specific Gravity, Urine: 1.015 (ref 1.005–1.030)
pH: 5 (ref 5.0–8.0)

## 2017-01-18 LAB — BASIC METABOLIC PANEL
Anion gap: 11 (ref 5–15)
BUN: 25 mg/dL — AB (ref 6–20)
CHLORIDE: 103 mmol/L (ref 101–111)
CO2: 23 mmol/L (ref 22–32)
Calcium: 9 mg/dL (ref 8.9–10.3)
Creatinine, Ser: 0.91 mg/dL (ref 0.61–1.24)
GFR calc Af Amer: >60 mL/min (ref >60)
GLUCOSE: 123 mg/dL — AB (ref 65–99)
Potassium: 4.1 mmol/L (ref 3.5–5.1)
Sodium: 137 mmol/L (ref 135–145)

## 2017-01-18 MED ORDER — ONDANSETRON HCL 4 MG PO TABS: 4.0000 mg | ORAL_TABLET | Freq: Four times a day (QID) | ORAL | 0 refills | Status: DC

## 2017-01-18 MED ORDER — HYDROMORPHONE HCL 1 MG/ML IJ SOLN
1.0000 mg | Freq: Once | INTRAMUSCULAR | Status: DC
  Filled 2017-01-18: qty 1

## 2017-01-18 MED ORDER — OXYCODONE-ACETAMINOPHEN 5-325 MG PO TABS: 2.0000 | ORAL_TABLET | ORAL | 0 refills | Status: DC | PRN

## 2017-01-18 MED ORDER — ONDANSETRON HCL 4 MG/2ML IJ SOLN
4.0000 mg | Freq: Once | INTRAMUSCULAR | Status: DC
  Filled 2017-01-18: qty 2

## 2017-01-18 MED ORDER — OXYCODONE-ACETAMINOPHEN 5-325 MG PO TABS
1.0000 | ORAL_TABLET | Freq: Once | ORAL | Status: AC
  Administered 2017-01-18: 1 via ORAL
  Filled 2017-01-18: qty 1

## 2017-01-18 MED ORDER — OXYCODONE-ACETAMINOPHEN 5-325 MG PO TABS: 1.0000 | ORAL_TABLET | ORAL | 0 refills | Status: DC | PRN

## 2017-01-18 NOTE — ED Provider Notes (Signed)
[facility] EMERGENCY DEPARTMENT Provider Note   CSN: [id number] Arrival date & time: 01/18/17  0344     History   Chief Complaint Chief Complaint  Patient presents with  . Flank Pain    HPI James Dyer is a 71 y.o. male.  Patient presents for evaluation of left flank pain.  Patient reports that symptoms began about an hour ago.  He reports fairly sudden onset of severe, sharp and stabbing pains in the left flank.  Patient reports associated nausea but no vomiting.  He has not had any fever. He has had a kidney stone in the past and this feels similar.  Patient has been experiencing hematuria for 6 or 8 weeks.  He did see his urologist and had cystoscopy, was told everything was okay.        Past Medical History:  Diagnosis Date  . Arthritis    HNP- lumbar  . Borderline diabetes   . Cancer [facility]) 2013   prostate , treated /w radiation   . Diabetes mellitus without complication ([place])   . 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 Hepatitis    "pt told had been exposed to hepatitis when he went to donate blood"  . History of radiation therapy 04/12/11 - 06/06/11   prostate, seminal vesicles  . History of stress test    Echocardiogram done - 03/2013  . Hypercholesterolemia    states Dr. Armandina Gemma Rx Welchol- pt. hs stopped taking because he believe it gives him  leg cramps.  . Hypertension   . Nephrolithiasis    renal calculi- , also has been told that he has a cyst on kidney - R side   . Neuromuscular disorder ([place])    bilateral foot drop- both feet   . Shortness of breath    yes- when he bends forward    Patient Active Problem List   Diagnosis Date Noted  . HNP (herniated nucleus pulposus), lumbar 09/01/2013  . HTN (hypertension) 03/19/2013  . Hyperlipemia 03/19/2013  . Diabetes type 2, controlled ([place]) 03/19/2013  . Chest pain 03/19/2013  . Pain in joint, shoulder region 10/23/2012  . Muscle weakness  (generalized) 10/23/2012  . S/P rotator cuff repair 10/23/2012  . Complete tear of rotator cuff 09/09/2012  . History of radiation therapy   . Prostate cancer ([place]) 12/18/2010    Past Surgical History:  Procedure Laterality Date  . COLONOSCOPY N/A 07/22/2012   Procedure: COLONOSCOPY;  Surgeon: [name], MD;  [facility] ENDO [facility];  Service: Gastroenterology;  Laterality: N/A;  . FOOT SURGERY Right 1980's   calcium deposit removed  . LUMBAR LAMINECTOMY/DECOMPRESSION MICRODISCECTOMY Right 09/01/2013   Procedure: RIGHT LUMBAR FOUR-FIVE LAMINECTOMY/DECOMPRESSION MICRODISCECTOMY 1 LEVEL;  Surgeon: [name], MD;  Location: [place] NEURO ORS;  Service: Neurosurgery;  Laterality: Right;  Right L45 microdiskectomy  . prostate biopsy  2013  . SHOULDER OPEN ROTATOR CUFF REPAIR Left 09/09/2012   Procedure: LEFT ROTATOR CUFF REPAIR SHOULDER OPEN;  Surgeon: [name], MD;  Location: [facility] OR[facility];  Service: Orthopedics;  Laterality: Left;       Home Medications    Prior to Admission medications   Medication Sig Start Date End Date Taking? Authorizing Provider  amLODipine (NORVASC) 5 MG tablet Take 1 tablet (5 mg total) by mouth daily. 06/18/13   Lendon Colonel, NP  aspirin 81 MG tablet Take 81 mg by mouth daily.    [provider]  calcium carbonate (TUMS - DOSED IN MG ELEMENTAL CALCIUM) 500 MG chewable tablet Chew 1-2 tablets by mouth daily as needed for indigestion or heartburn.    [provider]  cyclobenzaprine (FLEXERIL) 10 MG tablet Take 1 tablet (10 mg total) by mouth 3 (three) times daily as needed for muscle spasms. 09/02/13   [name], MD  enalapril (VASOTEC) 20 MG tablet Take 20 mg by mouth 2 (two) times daily.    [provider]  HYDROcodone-acetaminophen (NORCO/VICODIN) 5-325 MG per tablet Take 1 tablet by mouth every 6 (six) hours as needed for moderate pain. 09/02/13   [name], MD  metFORMIN (GLUCOPHAGE) 500 MG tablet Take 500 mg by mouth 2  (two) times daily with a meal.     [provider]  nitroGLYCERIN (NITROSTAT) 0.4 MG SL tablet Place 0.4 mg under the tongue every 5 (five) minutes as needed for chest pain.    [provider]  ondansetron (ZOFRAN) 4 MG tablet Take 1 tablet (4 mg total) by mouth every 6 (six) hours. 01/18/17   Orpah Greek, MD  oxyCODONE (ROXICODONE) 5 MG immediate release tablet Take 1-2 tablets (5-10 mg total) by mouth every 3 (three) hours as needed for pain. 09/10/12   Constable, Amber, PA-C  oxyCODONE-acetaminophen (PERCOCET) 5-325 MG tablet Take 2 tablets by mouth every 4 (four) hours as needed. 01/18/17   Orpah Greek, MD  oxyCODONE-acetaminophen (PERCOCET) 5-325 MG tablet Take 1-2 tablets by mouth every 4 (four) hours as needed. 01/18/17   Orpah Greek, MD  sildenafil (REVATIO) 20 MG tablet Take 20 mg by mouth daily as needed. erectile dysfunction    [provider]  sodium chloride (OCEAN) 0.65 % nasal spray Place 1 spray into the nose 2 (two) times daily as needed for congestion.    [provider]  tamsulosin (FLOMAX) 0.4 MG CAPS Take 0.4 mg by mouth every other day.     [provider]    Family History No family history on file.  Social History Social History   Tobacco Use  . Smoking status: Former Smoker    Packs/day: 1.00    Years: 5.00    Pack years: 5.00    Types: Cigarettes    Last attempt to quit: 12/18/1966    Years since quitting: 50.1  . Smokeless tobacco: Never Used  Substance Use Topics  . Alcohol use: Yes    Comment: occasional beer  . Drug use: No     Allergies   Latex and Welchol [colesevelam hcl]   Review of Systems Review of Systems  Genitourinary: Positive for flank pain and hematuria.  All other systems reviewed and are negative.    Physical Exam Updated Vital Signs BP (!) 146/94 (BP Location: Left Arm)   Pulse 70   Temp 98.5 F (36.9 C) (Oral)   Resp 17   Ht 5\' 10"  (1.778 m)    Wt 110.2 kg (243 lb)   SpO2 97%   BMI 34.87 kg/m   Physical Exam  Constitutional: He is oriented to person, place, and time. He appears well-developed and well-nourished. No distress.  HENT:  Head: Normocephalic and atraumatic.  Right Ear: Hearing normal.  Left Ear: Hearing normal.  Nose: Nose normal.  Mouth/Throat: Oropharynx is clear and moist and mucous membranes are normal.  Eyes: Conjunctivae and EOM are normal. Pupils are equal, round, and reactive to light.  Neck: Normal range of motion. Neck supple.  Cardiovascular: Regular rhythm, S1 normal and  S2 normal. Exam reveals no gallop and no friction rub.  No murmur heard. Pulmonary/Chest: Effort normal and breath sounds normal. No respiratory distress. He exhibits no tenderness.  Abdominal: Soft. Normal appearance and bowel sounds are normal. There is no hepatosplenomegaly. There is no tenderness. There is no rebound, no guarding, no tenderness at McBurney's point and negative Murphy's sign. No hernia.  Musculoskeletal: Normal range of motion.  Neurological: He is alert and oriented to person, place, and time. He has normal strength. No cranial nerve deficit or sensory deficit. Coordination normal. GCS eye subscore is 4. GCS verbal subscore is 5. GCS motor subscore is 6.  Skin: Skin is warm, dry and intact. No rash noted. No cyanosis.  Psychiatric: He has a normal mood and affect. His speech is normal and behavior is normal. Thought content normal.  Nursing note and vitals reviewed.    ED Treatments / Results  Labs (all labs ordered are listed, but only abnormal results are displayed) Labs Reviewed  BASIC METABOLIC PANEL - Abnormal; Notable for the following components:      Result Value   Glucose, Bld 123 (*)    BUN 25 (*)    All other components within normal limits  URINALYSIS, ROUTINE W REFLEX MICROSCOPIC - Abnormal; Notable for the following components:   Hgb urine dipstick LARGE (*)    All other components within normal  limits  CBC WITH DIFFERENTIAL/PLATELET    EKG  EKG Interpretation None       Radiology Ct Renal Stone Study  Result Date: 01/18/2017 CLINICAL DATA:  Flank pain EXAM: CT ABDOMEN AND PELVIS WITHOUT CONTRAST TECHNIQUE: Multidetector CT imaging of the abdomen and pelvis was performed following the standard protocol without IV contrast. COMPARISON:  CT abdomen pelvis 05/29/2016 FINDINGS: Lower chest: No pulmonary nodules or pleural effusion. No visible pericardial effusion. Hepatobiliary: Normal hepatic contours and density. No visible biliary dilatation. Normal gallbladder. Pancreas: Normal contours without ductal dilatation. No peripancreatic fluid collection. Spleen: Normal. Adrenals/Urinary Tract: --Adrenal glands: Normal. --Right kidney/ureter: No hydronephrosis or perinephric stranding. No nephrolithiasis. No obstructing ureteral stones. --Left kidney/ureter: Moderate left hydroureteronephrosis secondary to obstructing stone at the left ureteropelvic junction, measuring 8 x 4 mm. There are multiple other left renal stones that measure up to 4 mm, but these are not causing obstruction. --Urinary bladder: Unremarkable. Stomach/Bowel: --Stomach/Duodenum: No hiatal hernia or other gastric abnormality. Normal duodenal course and caliber. --Small bowel: No dilatation or inflammation. --Colon: No focal abnormality. --Appendix: Normal. Vascular/Lymphatic: Normal course and caliber of the major abdominal vessels. Atherosclerotic calcification of the common iliac arteries. No abdominal or pelvic lymphadenopathy. Reproductive: Prostate calcifications. Musculoskeletal. L4 hemangioma. Multilevel degenerative disc disease and facet arthrosis. No bony spinal canal stenosis. Other: There is a ring artifact throughout the scan, which is caused by a failing or not calibrated detector. The diagnostic quality of the scan is not compromised in this case. IMPRESSION: 1. Left obstructive uropathy with 8 x 4 mm  ureteropelvic junction stone causing moderate left hydroureteronephrosis and mild perinephric stranding. 2. Multiple other nonobstructing left renal stones. Electronically Signed   By: Ulyses Jarred M.D.   On: 01/18/2017 05:32    Procedures Procedures (including critical care time)  Medications Ordered in ED Medications  HYDROmorphone (DILAUDID) injection 1 mg (1 mg Intravenous Not Given 01/18/17 0608)  ondansetron (ZOFRAN) injection 4 mg (4 mg Intravenous Not Given 01/18/17 0608)  oxyCODONE-acetaminophen (PERCOCET/ROXICET) 5-325 MG per tablet 1 tablet (1 tablet Oral Given 01/18/17 0102)     Initial Impression /  Assessment and Plan / ED Course  I have reviewed the triage vital signs and the nursing notes.  Pertinent labs & imaging results that were available during my care of the patient were reviewed by me and considered in my medical decision making (see chart for details).    Patient presented with sudden onset left flank pain similar to previous renal colic he has had.  Workup does confirm a 4 mm x 8 mm ureteral stone.  Patient declined IV Dilaudid that was ordered at arrival because his pain was improving and he did become pain-free without any intervention here in the ER.  Patient was told that he was likely experienced some relief as the stone moved into a nonobstructive position, but that as the past he was going to have increased pain.  He was given a Percocet here in the ER and will be given additional Percocet to use at home.  He will contact urology today for an appointment.  As it is the weekend prior to Christmas, he likely will need to come back to the ER if he has any uncontrolled pain.   Final Clinical Impressions(s) / ED Diagnoses   Final diagnoses:  Ureteral colic    ED Discharge Orders        Ordered    oxyCODONE-acetaminophen (PERCOCET) 5-325 MG tablet  Every 4 hours PRN     01/18/17 0624    oxyCODONE-acetaminophen (PERCOCET) 5-325 MG tablet  Every 4 hours PRN       01/18/17 0625    ondansetron (ZOFRAN) 4 MG tablet  Every 6 hours     01/18/17 0625       Orpah Greek, MD 01/18/17 [phone]

## 2017-01-18 NOTE — ED Triage Notes (Signed)
Pt reports pain to left flank and left lower abd onset approx 1 hour ago.  Pt reports hx of kidney stones in the past.

## 2017-01-24 MED FILL — Oxycodone w/ Acetaminophen Tab 5-325 MG: ORAL | Qty: 6 | Status: AC

## 2017-02-19 ENCOUNTER — Other Ambulatory Visit: Payer: Self-pay | Admitting: Urology

## 2017-02-19 ENCOUNTER — Ambulatory Visit (HOSPITAL_COMMUNITY)
Admission: RE | Admit: 2017-02-19 | Discharge: 2017-02-19 | Disposition: A | Discharge status: Home / Self Care | Payer: Medicare Other | Source: Ambulatory Visit | Attending: Urology | Admitting: Urology

## 2017-02-19 ENCOUNTER — Ambulatory Visit (INDEPENDENT_AMBULATORY_CARE_PROVIDER_SITE_OTHER): Payer: Medicare Other | Admitting: Urology

## 2017-02-19 DIAGNOSIS — N202 Calculus of kidney with calculus of ureter: Secondary | ICD-10-CM

## 2017-02-19 DIAGNOSIS — N200 Calculus of kidney: Secondary | ICD-10-CM | POA: Diagnosis not present

## 2017-02-22 ENCOUNTER — Other Ambulatory Visit: Payer: Self-pay | Admitting: Urology

## 2017-02-27 ENCOUNTER — Encounter (HOSPITAL_COMMUNITY): Payer: Self-pay | Admitting: General Practice

## 2017-03-03 NOTE — H&P (Signed)
Urology Preoperative H&P   Chief Complaint: Left flank pain.  History of kidney stones  History of Present Illness: James Dyer is a 72 y.o. male who presented to the [facility] ED on 01/18/17 with acute left flank pain.  He had a CT abd/pel at that time that demonstrated an 8 mm left UPJ stone with several other left renal stones.  He was seen in f/u on 02/19/17 reporting resolution of his left flank pain.  He had an additional KUB that day that re-demonstrated an 8 mm left renal stone, but with no signs of any ureteral stones.  He denies any filling or voiding urinary symptoms, dysuria, hematuria, nausea, vomiting, fever or chills at this time.      Past Medical History:  Diagnosis Date  . Arthritis    HNP- lumbar  . Borderline diabetes   . Cancer [facility]) 2013   prostate , treated /w radiation   . Diabetes mellitus without complication ([place])   . 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 Hepatitis    "pt told had been exposed to hepatitis when he went to donate blood"  . History of kidney stones   . History of radiation therapy 04/12/11 - 06/06/11   prostate, seminal vesicles  . History of stress test    Echocardiogram done - 03/2013  . Hypercholesterolemia    states Dr. Armandina Gemma Rx Welchol- pt. hs stopped taking because he believe it gives him  leg cramps.  . Hypertension   . Nephrolithiasis    renal calculi- , also has been told that he has a cyst on kidney - R side   . Neuromuscular disorder ([place])    bilateral foot drop- both feet     Past Surgical History:  Procedure Laterality Date  . BACK SURGERY    . COLONOSCOPY N/A 07/22/2012   Procedure: COLONOSCOPY;  Surgeon: [name], MD;  [facility];  Service: Gastroenterology;  Laterality: N/A;  . FOOT SURGERY Right 1980's   calcium deposit removed  . LUMBAR LAMINECTOMY/DECOMPRESSION MICRODISCECTOMY Right 09/01/2013   Procedure: RIGHT LUMBAR FOUR-FIVE LAMINECTOMY/DECOMPRESSION  MICRODISCECTOMY 1 LEVEL;  Surgeon: [name], MD;  Location: [place] NEUR[facility] ORS;  Service: Neurosurgery;  Laterality: Right;  Right L45 microdiskectomy  . prostate biopsy  2013  . SHOULDER OPEN ROTATOR CUFF REPAIR Left 09/09/2012   Procedure: LEFT ROTATOR CUFF REPAIR SHOULDER OPEN;  Surgeon: [name], MD;  [facility];  Service: Orthopedics;  Laterality: Left;    Allergies:  Allergies  Allergen Reactions  . Latex Other (See Comments)    "sores in mouth after teeth cleaned"  . Welchol [Colesevelam Hcl]     Leg cramps    History reviewed. No pertinent family history.  Social History:  reports that he quit smoking about 50 years ago. His smoking use included cigarettes. He has a 5.00 pack-year smoking history. he has never used smokeless tobacco. He reports that he drinks alcohol. He reports that he does not use drugs.  ROS: A complete review of systems was performed.  All systems are negative except for pertinent findings as noted.  Physical Exam:  Vital signs in last 24 hours:   Constitutional:  Alert and oriented, No acute distress Cardiovascular: Regular rate and rhythm, No JVD Respiratory: Normal respiratory effort, Lungs clear bilaterally GI: Abdomen is soft, nontender, nondistended, no abdominal masses GU: No CVA tenderness Lymphatic: No lymphadenopathy Neurologic: Grossly intact, no focal  deficits Psychiatric: Normal mood and affect  Laboratory Data:  No results for input(s): WBC, HGB, HCT, PLT in the last 72 hours.  No results for input(s): NA, K, CL, GLUCOSE, BUN, CALCIUM, CREATININE in the last 72 hours.  Invalid input(s): CO3   No results found for this or any previous visit (from the past 24 hour(s)). No results found for this or any previous visit (from the past 240 hour(s)).  Renal Function: No results for input(s): CREATININE in the last 168 hours. CrCl cannot be calculated (Patient's most recent lab result is older than the maximum 21 days  allowed.).  Radiologic Imaging: No results found.  I independently reviewed the above imaging studies.  Assessment and Plan James Dyer is a 72 y.o. male with an 8 mm left renal stone  -The risks, benefits and alternatives of left ESWL was discussed with the patient.  Risks included, but are not limited to, bleeding, renal hematoma, urinary tract infections, multiple surgeries to address the treated kidney stones, flank pain, flank bruising and the inherent risks of anesthesia.  The patient voices understanding and wishes to proceed.  Ellison Hughs, MD 03/03/2017, 1:27 PM  [facility] Pager: [phone]

## 2017-03-04 ENCOUNTER — Encounter (HOSPITAL_COMMUNITY): Payer: Self-pay | Admitting: *Deleted

## 2017-03-04 ENCOUNTER — Other Ambulatory Visit: Payer: Self-pay

## 2017-03-04 ENCOUNTER — Encounter (HOSPITAL_COMMUNITY)
Admission: RE | Disposition: A | Discharge status: Home / Self Care | Payer: Self-pay | Source: Ambulatory Visit | Attending: Urology

## 2017-03-04 ENCOUNTER — Ambulatory Visit (HOSPITAL_COMMUNITY): Payer: Medicare Other

## 2017-03-04 ENCOUNTER — Ambulatory Visit (HOSPITAL_COMMUNITY)
Admission: RE | Admit: 2017-03-04 | Discharge: 2017-03-04 | Disposition: A | Discharge status: Home / Self Care | Payer: Medicare Other | Source: Ambulatory Visit | Attending: Urology | Admitting: Urology

## 2017-03-04 DIAGNOSIS — E119 Type 2 diabetes mellitus without complications: Secondary | ICD-10-CM | POA: Diagnosis not present

## 2017-03-04 DIAGNOSIS — I10 Essential (primary) hypertension: Secondary | ICD-10-CM | POA: Diagnosis not present

## 2017-03-04 DIAGNOSIS — Z8546 Personal history of malignant neoplasm of prostate: Secondary | ICD-10-CM | POA: Insufficient documentation

## 2017-03-04 DIAGNOSIS — N200 Calculus of kidney: Secondary | ICD-10-CM | POA: Diagnosis not present

## 2017-03-04 DIAGNOSIS — E669 Obesity, unspecified: Secondary | ICD-10-CM | POA: Diagnosis not present

## 2017-03-04 DIAGNOSIS — Z87891 Personal history of nicotine dependence: Secondary | ICD-10-CM | POA: Insufficient documentation

## 2017-03-04 DIAGNOSIS — R109 Unspecified abdominal pain: Secondary | ICD-10-CM | POA: Diagnosis present

## 2017-03-04 DIAGNOSIS — Z923 Personal history of irradiation: Secondary | ICD-10-CM | POA: Diagnosis not present

## 2017-03-04 DIAGNOSIS — Z6836 Body mass index (BMI) 36.0-36.9, adult: Secondary | ICD-10-CM | POA: Insufficient documentation

## 2017-03-04 HISTORY — PX: EXTRACORPOREAL SHOCK WAVE LITHOTRIPSY: SHX1557

## 2017-03-04 HISTORY — DX: Personal history of urinary calculi: Z87.442

## 2017-03-04 LAB — GLUCOSE, CAPILLARY: Glucose-Capillary: 111 mg/dL — ABNORMAL HIGH (ref 65–99)

## 2017-03-04 SURGERY — LITHOTRIPSY, ESWL
Anesthesia: LOCAL | Laterality: Left

## 2017-03-04 MED ORDER — DIAZEPAM 5 MG PO TABS
10.0000 mg | ORAL_TABLET | ORAL | Status: AC
  Administered 2017-03-04: 10 mg via ORAL
  Filled 2017-03-04: qty 2

## 2017-03-04 MED ORDER — LEVOFLOXACIN 500 MG PO TABS
500.0000 mg | ORAL_TABLET | ORAL | Status: AC
  Administered 2017-03-04: 500 mg via ORAL
  Filled 2017-03-04: qty 1

## 2017-03-04 MED ORDER — LACTATED RINGERS IV SOLN
INTRAVENOUS | Status: DC
  Administered 2017-03-04: 09:00:00 via INTRAVENOUS

## 2017-03-04 MED ORDER — DIPHENHYDRAMINE HCL 25 MG PO CAPS
25.0000 mg | ORAL_CAPSULE | ORAL | Status: AC
  Administered 2017-03-04: 25 mg via ORAL
  Filled 2017-03-04: qty 1

## 2017-03-04 MED ORDER — SODIUM CHLORIDE 0.9 % IV SOLN
INTRAVENOUS | Status: DC
  Administered 2017-03-04: 07:00:00 via INTRAVENOUS

## 2017-03-04 NOTE — Interval H&P Note (Signed)
History and Physical Interval Note:  03/04/2017 7:30 AM  James Dyer  has presented today for surgery, with the diagnosis of LEFT RENAL STONE  The various methods of treatment have been discussed with the patient and family. After consideration of risks, benefits and other options for treatment, the patient has consented to  Procedure(s): LEFT EXTRACORPOREAL SHOCK WAVE LITHOTRIPSY (ESWL) (Left) as a surgical intervention .  The patient's history has been reviewed, patient examined, no change in status, stable for surgery.  I have reviewed the patient's chart and labs.  Questions were answered to the patient's satisfaction.     James Dyer

## 2017-03-04 NOTE — Discharge Instructions (Signed)
Moderate Conscious Sedation, Adult, Care After °These instructions provide you with information about caring for yourself after your procedure. Your health care provider may also give you more specific instructions. Your treatment has been planned according to current medical practices, but problems sometimes occur. Call your health care provider if you have any problems or questions after your procedure. °What can I expect after the procedure? °After your procedure, it is common: °To feel sleepy for several hours. °To feel clumsy and have poor balance for several hours. °To have poor judgment for several hours. °To vomit if you eat too soon. ° °Follow these instructions at home: °For at least 24 hours after the procedure: ° °Do not: °Participate in activities where you could fall or become injured. °Drive. °Use heavy machinery. °Drink alcohol. °Take sleeping pills or medicines that cause drowsiness. °Make important decisions or sign legal documents. °Take care of children on your own. °Rest. °Eating and drinking °Follow the diet recommended by your health care provider. °If you vomit: °Drink water, juice, or soup when you can drink without vomiting. °Make sure you have little or no nausea before eating solid foods. °General instructions °Have a responsible adult stay with you until you are awake and alert. °Take over-the-counter and prescription medicines only as told by your health care provider. °If you smoke, do not smoke without supervision. °Keep all follow-up visits as told by your health care provider. This is important. °Contact a health care provider if: °You keep feeling nauseous or you keep vomiting. °You feel light-headed. °You develop a rash. °You have a fever. °Get help right away if: °You have trouble breathing. °This information is not intended to replace advice given to you by your health care provider. Make sure you discuss any questions you have with your health care provider. °Document Released:  11/05/2012 Document Revised: 06/20/2015 Document Reviewed: 05/07/2015 °Elsevier Interactive Patient Education © 2018 Else[employer] Inc[employer] °Lithotripsy, Care After °This sheet gives you information about how to care for yourself after your procedure. Your health care provider may also give you more specific instructions. If you have problems or questions, contact your health care provider. °What can I expect after the procedure? °After the procedure, it is common to have: °· Some blood in your urine. This should only last for a few days. °· Soreness in your back, sides, or upper abdomen for a few days. °· Blotches or bruises on your back where the pressure wave entered the skin. °· Pain, discomfort, or nausea when pieces (fragments) of the kidney stone move through the tube that carries urine from the kidney to the bladder (ureter). Stone fragments may pass soon after the procedure, but they may continue to pass for up to 4-8 weeks. °? If you have severe pain or nausea, contact your health care provider. This may be caused by a large stone that was not broken up, and this may mean that you need more treatment. °· Some pain or discomfort during urination. °· Some pain or discomfort in the lower abdomen or (in men) at the base of the penis. ° °Follow these instructions at home: °Medicines °· Take over-the-counter and prescription medicines only as told by your health care provider. °· If you were prescribed an antibiotic medicine, take it as told by your health care provider. Do not stop taking the antibiotic even if you start to feel better. °· Do not drive for 24 hours if you were given a medicine to help you relax (sedative). °· Do not drive   or use heavy machinery while taking prescription pain medicine. °Eating and drinking °· Drink enough water and fluids to keep your urine clear or pale yellow. This helps any remaining pieces of the stone to pass. It can also help prevent new stones from forming. °· Eat plenty of fresh  fruits and vegetables. °· Follow instructions from your health care provider about eating and drinking restrictions. You may be instructed: °? To reduce how much salt (sodium) you eat or drink. Check ingredients and nutrition facts on packaged foods and beverages. °? To reduce how much meat you eat. °· Eat the recommended amount of calcium for your age and gender. Ask your health care provider how much calcium you should have. °General instructions °· Get plenty of rest. °· Most people can resume normal activities 1-2 days after the procedure. Ask your health care provider what activities are safe for you. °· If directed, strain all urine through the strainer that was provided by your health care provider. °? Keep all fragments for your health care provider to see. Any stones that are found may be sent to a medical lab for examination. The stone may be as small as a grain of salt. °· Keep all follow-up visits as told by your health care provider. This is important. °Contact a health care provider if: °· You have pain that is severe or does not get better with medicine. °· You have nausea that is severe or does not go away. °· You have blood in your urine longer than your health care provider told you to expect. °· You have more blood in your urine. °· You have pain during urination that does not go away. °· You urinate more frequently than usual and this does not go away. °· You develop a rash or any other possible signs of an allergic reaction. °Get help right away if: °· You have severe pain in your back, sides, or upper abdomen. °· You have severe pain while urinating. °· Your urine is very dark red. °· You have blood in your stool (feces). °· You cannot pass any urine at all. °· You feel a strong urge to urinate after emptying your bladder. °· You have a fever or chills. °· You develop shortness of breath, difficulty breathing, or chest pain. °· You have severe nausea that leads to persistent vomiting. °· You  faint. °Summary °· After this procedure, it is common to have some pain, discomfort, or nausea when pieces (fragments) of the kidney stone move through the tube that carries urine from the kidney to the bladder (ureter). If this pain or nausea is severe, however, you should contact your health care provider. °· Most people can resume normal activities 1-2 days after the procedure. Ask your health care provider what activities are safe for you. °· Drink enough water and fluids to keep your urine clear or pale yellow. This helps any remaining pieces of the stone to pass, and it can help prevent new stones from forming. °· If directed, strain your urine and keep all fragments for your health care provider to see. Fragments or stones may be as small as a grain of salt. °· Get help right away if you have severe pain in your back, sides, or upper abdomen or have severe pain while urinating. °This information is not intended to replace advice given to you by your health care provider. Make sure you discuss any questions you have with your health care provider. °Document Released: 02/04/2007 Document Revised:   12/07/2015 Document Reviewed: 12/07/2015 °Elsevier Interactive Patient Education © 2018 Elsevier Inc. ° °

## 2017-03-04 NOTE — Op Note (Signed)
See [facility] OP note scanned into chart. Also because of the size, density, location and other factors that cannot be anticipated I feel this will likely be a staged procedure. This fact supersedes any indication in the scanned [facility] operative note to the contrary.  

## 2017-03-05 ENCOUNTER — Encounter (HOSPITAL_COMMUNITY): Payer: Self-pay | Admitting: Urology

## 2017-03-14 DIAGNOSIS — H6693 Otitis media, unspecified, bilateral: Secondary | ICD-10-CM | POA: Diagnosis not present

## 2017-03-14 DIAGNOSIS — J019 Acute sinusitis, unspecified: Secondary | ICD-10-CM | POA: Diagnosis not present

## 2017-03-14 DIAGNOSIS — Z1389 Encounter for screening for other disorder: Secondary | ICD-10-CM | POA: Diagnosis not present

## 2017-03-14 DIAGNOSIS — Z6837 Body mass index (BMI) 37.0-37.9, adult: Secondary | ICD-10-CM | POA: Diagnosis not present

## 2017-04-08 DIAGNOSIS — K219 Gastro-esophageal reflux disease without esophagitis: Secondary | ICD-10-CM | POA: Diagnosis not present

## 2017-04-08 DIAGNOSIS — Z6837 Body mass index (BMI) 37.0-37.9, adult: Secondary | ICD-10-CM | POA: Diagnosis not present

## 2017-04-08 DIAGNOSIS — N400 Enlarged prostate without lower urinary tract symptoms: Secondary | ICD-10-CM | POA: Diagnosis not present

## 2017-04-08 DIAGNOSIS — E782 Mixed hyperlipidemia: Secondary | ICD-10-CM | POA: Diagnosis not present

## 2017-04-08 DIAGNOSIS — E119 Type 2 diabetes mellitus without complications: Secondary | ICD-10-CM | POA: Diagnosis not present

## 2017-04-08 DIAGNOSIS — I110 Hypertensive heart disease with heart failure: Secondary | ICD-10-CM | POA: Diagnosis not present

## 2017-04-18 ENCOUNTER — Other Ambulatory Visit: Payer: Self-pay | Admitting: Urology

## 2017-04-18 ENCOUNTER — Ambulatory Visit (HOSPITAL_COMMUNITY)
Admission: RE | Admit: 2017-04-18 | Discharge: 2017-04-18 | Disposition: A | Discharge status: Home / Self Care | Payer: Medicare Other | Source: Ambulatory Visit | Attending: Urology | Admitting: Urology

## 2017-04-18 DIAGNOSIS — N200 Calculus of kidney: Secondary | ICD-10-CM

## 2017-04-18 DIAGNOSIS — Z87442 Personal history of urinary calculi: Secondary | ICD-10-CM | POA: Diagnosis not present

## 2017-04-18 DIAGNOSIS — Z09 Encounter for follow-up examination after completed treatment for conditions other than malignant neoplasm: Secondary | ICD-10-CM | POA: Insufficient documentation

## 2017-04-23 ENCOUNTER — Other Ambulatory Visit (HOSPITAL_COMMUNITY)
Admission: RE | Admit: 2017-04-23 | Discharge: 2017-04-23 | Disposition: A | Discharge status: Home / Self Care | Payer: Medicare Other | Source: Ambulatory Visit | Attending: Urology | Admitting: Urology

## 2017-04-23 ENCOUNTER — Ambulatory Visit (INDEPENDENT_AMBULATORY_CARE_PROVIDER_SITE_OTHER): Payer: Medicare Other | Admitting: Urology

## 2017-04-23 DIAGNOSIS — C61 Malignant neoplasm of prostate: Secondary | ICD-10-CM

## 2017-04-23 DIAGNOSIS — N202 Calculus of kidney with calculus of ureter: Secondary | ICD-10-CM | POA: Insufficient documentation

## 2017-04-23 DIAGNOSIS — N201 Calculus of ureter: Secondary | ICD-10-CM

## 2017-04-26 LAB — STONE ANALYSIS
CA PHOS CRY STONE QL IR: 5 %
Ca Oxalate,Monohydr.: 95 %
STONE WEIGHT KSTONE: 108 mg

## 2017-05-06 DIAGNOSIS — N200 Calculus of kidney: Secondary | ICD-10-CM | POA: Diagnosis not present

## 2017-05-08 ENCOUNTER — Encounter (INDEPENDENT_AMBULATORY_CARE_PROVIDER_SITE_OTHER): Payer: Self-pay | Admitting: *Deleted

## 2017-05-08 ENCOUNTER — Ambulatory Visit (INDEPENDENT_AMBULATORY_CARE_PROVIDER_SITE_OTHER): Payer: Medicare Other | Admitting: Internal Medicine

## 2017-05-08 ENCOUNTER — Encounter (INDEPENDENT_AMBULATORY_CARE_PROVIDER_SITE_OTHER): Payer: Self-pay | Admitting: Internal Medicine

## 2017-05-08 VITALS — BP 180/100 | HR 64 | Temp 98.1°F | Ht 70.0 in | Wt 255.0 lb

## 2017-05-08 DIAGNOSIS — R1310 Dysphagia, unspecified: Secondary | ICD-10-CM | POA: Diagnosis not present

## 2017-05-08 DIAGNOSIS — K219 Gastro-esophageal reflux disease without esophagitis: Secondary | ICD-10-CM

## 2017-05-08 DIAGNOSIS — R1319 Other dysphagia: Secondary | ICD-10-CM

## 2017-05-08 NOTE — Progress Notes (Signed)
Subjective:    Patient ID: James Dyer, male    DOB: 29-Jun-1945, 72 y.o.   MRN: [id number]  HPIReferred by Dr. [name] for GERD. He did have some nausea (he describes as a weak nausea).  He says he never had an ulcer. He has some discomfort in his abdomen. He was started on Protonix about 2 weeks ago and his symptoms are better. He says he has to wash his food down with liquids. It feels like food is hanging. Symptoms for years.  Diabetic x 1 year   Review of Systems Past Medical History:  Diagnosis Date  . Arthritis    HNP- lumbar  . Borderline diabetes   . Cancer [facility]) 2013   prostate , treated /w radiation   . Diabetes mellitus without complication ([place])   . 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 Hepatitis    "pt told had been exposed to hepatitis when he went to donate blood"  . History of kidney stones   . History of radiation therapy 04/12/11 - 06/06/11   prostate, seminal vesicles  . History of stress test    Echocardiogram done - 03/2013  . Hypercholesterolemia    states Dr. Armandina Gemma Rx Welchol- pt. hs stopped taking because he believe it gives him  leg cramps.  . Hypertension   . Nephrolithiasis    renal calculi- , also has been told that he has a cyst on kidney - R side   . Neuromuscular disorder ([place])    bilateral foot drop- both feet     Past Surgical History:  Procedure Laterality Date  . BACK SURGERY    . COLONOSCOPY N/A 07/22/2012   Procedure: COLONOSCOPY;  Surgeon: [name], MD;  [facility];  Service: Gastroenterology;  Laterality: N/A;  . EXTRACORPOREAL SHOCK WAVE LITHOTRIPSY Left 03/04/2017   Procedure: LEFT EXTRACORPOREAL SHOCK WAVE LITHOTRIPSY (ESWL);  Surgeon: [name], MD;  Location: [facility];  Service: Urology;  Laterality: Left;  . FOOT SURGERY Right 1980's   calcium deposit removed  . LUMBAR LAMINECTOMY/DECOMPRESSION MICRODISCECTOMY Right 09/01/2013   Procedure:  RIGHT LUMBAR FOUR-FIVE LAMINECTOMY/DECOMPRESSION MICRODISCECTOMY 1 LEVEL;  Surgeon: [name], MD;  Location: [place] [facility] OR[facility];  Service: Neurosurgery;  Laterality: Right;  Right L45 microdiskectomy  . prostate biopsy  2013  . SHOULDER OPEN ROTATOR CUFF REPAIR Left 09/09/2012   Procedure: LEFT ROTATOR CUFF REPAIR SHOULDER OPEN;  Surgeon: [name], MD;  Location: [facility];  Service: Orthopedics;  Laterality: Left;    Allergies  Allergen Reactions  . Latex Other (See Comments)    "sores in mouth after teeth cleaned"  . Welchol [Colesevelam Hcl]     Leg cramps    Current Outpatient Medications on File Prior to Visit  Medication Sig Dispense Refill  . enalapril (VASOTEC) 20 MG tablet Take 20 mg by mouth 2 (two) times daily.     . metFORMIN (GLUCOPHAGE) 500 MG tablet Take 500 mg by mouth 2 (two) times daily with a meal.     . pantoprazole (PROTONIX) 40 MG tablet Take 40 mg by mouth daily.    . sildenafil (REVATIO) 20 MG tablet Take 20 mg by mouth daily as needed. erectile dysfunction    . sodium chloride (OCEAN) 0.65 % nasal spray Place 1 spray into the nose 2 (two) times daily as needed for congestion.    . tamsulosin (FLOMAX) 0.4 MG CAPS Take 0.4 mg  by mouth every other day.      No current facility-administered medications on file prior to visit.        Objective:   Physical Exam Blood pressure (!) 180/100, pulse 64, temperature 98.1 F (36.7 C), height 5\' 10"  (1.778 m), weight 255 lb (115.7 kg). Alert and oriented. Skin warm and dry. Oral mucosa is moist.   . Sclera anicteric, conjunctivae is pink. Thyroid not enlarged. No cervical lymphadenopathy. Lungs clear. Heart regular rate and rhythm.  Abdomen is soft. Bowel sounds are positive. No hepatomegaly. No abdominal masses felt. No tenderness.  No edema to lower extremities.           Assessment & Plan:  GERD. Continue the Protonix. Dysphagia. DG esophagram. Will need an EGD once I have DG esophagram

## 2017-05-08 NOTE — Patient Instructions (Signed)
Continue the Protonix. DG esophagram.

## 2017-05-14 ENCOUNTER — Ambulatory Visit (HOSPITAL_COMMUNITY)
Admission: RE | Admit: 2017-05-14 | Discharge: 2017-05-14 | Disposition: A | Discharge status: Home / Self Care | Payer: Medicare Other | Source: Ambulatory Visit | Attending: Internal Medicine | Admitting: Internal Medicine

## 2017-05-14 DIAGNOSIS — R1310 Dysphagia, unspecified: Secondary | ICD-10-CM

## 2017-05-14 DIAGNOSIS — R1319 Other dysphagia: Secondary | ICD-10-CM

## 2017-07-09 DIAGNOSIS — Z1389 Encounter for screening for other disorder: Secondary | ICD-10-CM | POA: Diagnosis not present

## 2017-07-09 DIAGNOSIS — Z6837 Body mass index (BMI) 37.0-37.9, adult: Secondary | ICD-10-CM | POA: Diagnosis not present

## 2017-07-09 DIAGNOSIS — I10 Essential (primary) hypertension: Secondary | ICD-10-CM | POA: Diagnosis not present

## 2017-07-09 DIAGNOSIS — E782 Mixed hyperlipidemia: Secondary | ICD-10-CM | POA: Diagnosis not present

## 2017-07-09 DIAGNOSIS — M21379 Foot drop, unspecified foot: Secondary | ICD-10-CM | POA: Diagnosis not present

## 2017-07-09 DIAGNOSIS — Z0001 Encounter for general adult medical examination with abnormal findings: Secondary | ICD-10-CM | POA: Diagnosis not present

## 2017-07-09 DIAGNOSIS — C61 Malignant neoplasm of prostate: Secondary | ICD-10-CM | POA: Diagnosis not present

## 2017-07-09 DIAGNOSIS — E119 Type 2 diabetes mellitus without complications: Secondary | ICD-10-CM | POA: Diagnosis not present

## 2017-08-08 DIAGNOSIS — H25813 Combined forms of age-related cataract, bilateral: Secondary | ICD-10-CM | POA: Diagnosis not present

## 2017-08-08 DIAGNOSIS — H40011 Open angle with borderline findings, low risk, right eye: Secondary | ICD-10-CM | POA: Diagnosis not present

## 2017-08-08 DIAGNOSIS — E119 Type 2 diabetes mellitus without complications: Secondary | ICD-10-CM | POA: Diagnosis not present

## 2017-09-04 DIAGNOSIS — Z6838 Body mass index (BMI) 38.0-38.9, adult: Secondary | ICD-10-CM | POA: Diagnosis not present

## 2017-09-04 DIAGNOSIS — R42 Dizziness and giddiness: Secondary | ICD-10-CM | POA: Diagnosis not present

## 2017-09-04 DIAGNOSIS — R531 Weakness: Secondary | ICD-10-CM | POA: Diagnosis not present

## 2017-09-04 DIAGNOSIS — R110 Nausea: Secondary | ICD-10-CM | POA: Diagnosis not present

## 2017-10-02 DIAGNOSIS — A4902 Methicillin resistant Staphylococcus aureus infection, unspecified site: Secondary | ICD-10-CM | POA: Diagnosis not present

## 2017-10-02 DIAGNOSIS — Z23 Encounter for immunization: Secondary | ICD-10-CM | POA: Diagnosis not present

## 2017-10-02 DIAGNOSIS — Z6838 Body mass index (BMI) 38.0-38.9, adult: Secondary | ICD-10-CM | POA: Diagnosis not present

## 2017-10-02 DIAGNOSIS — L0292 Furuncle, unspecified: Secondary | ICD-10-CM | POA: Diagnosis not present

## 2017-11-07 DIAGNOSIS — R310 Gross hematuria: Secondary | ICD-10-CM | POA: Diagnosis not present

## 2017-11-07 DIAGNOSIS — Z87442 Personal history of urinary calculi: Secondary | ICD-10-CM | POA: Diagnosis not present

## 2017-11-26 DIAGNOSIS — R300 Dysuria: Secondary | ICD-10-CM | POA: Diagnosis not present

## 2017-11-26 DIAGNOSIS — R310 Gross hematuria: Secondary | ICD-10-CM | POA: Diagnosis not present

## 2017-11-26 DIAGNOSIS — Z87442 Personal history of urinary calculi: Secondary | ICD-10-CM | POA: Diagnosis not present

## 2018-03-11 DIAGNOSIS — R5383 Other fatigue: Secondary | ICD-10-CM | POA: Diagnosis not present

## 2018-03-11 DIAGNOSIS — E7849 Other hyperlipidemia: Secondary | ICD-10-CM | POA: Diagnosis not present

## 2018-03-11 DIAGNOSIS — E119 Type 2 diabetes mellitus without complications: Secondary | ICD-10-CM | POA: Diagnosis not present

## 2018-03-11 DIAGNOSIS — Z6838 Body mass index (BMI) 38.0-38.9, adult: Secondary | ICD-10-CM | POA: Diagnosis not present

## 2018-03-11 DIAGNOSIS — C61 Malignant neoplasm of prostate: Secondary | ICD-10-CM | POA: Diagnosis not present

## 2018-03-11 DIAGNOSIS — J069 Acute upper respiratory infection, unspecified: Secondary | ICD-10-CM | POA: Diagnosis not present

## 2018-03-11 DIAGNOSIS — I10 Essential (primary) hypertension: Secondary | ICD-10-CM | POA: Diagnosis not present

## 2018-03-12 DIAGNOSIS — L820 Inflamed seborrheic keratosis: Secondary | ICD-10-CM | POA: Diagnosis not present

## 2018-03-12 DIAGNOSIS — D225 Melanocytic nevi of trunk: Secondary | ICD-10-CM | POA: Diagnosis not present

## 2018-03-12 DIAGNOSIS — L720 Epidermal cyst: Secondary | ICD-10-CM | POA: Diagnosis not present

## 2018-03-12 DIAGNOSIS — L918 Other hypertrophic disorders of the skin: Secondary | ICD-10-CM | POA: Diagnosis not present

## 2018-03-12 DIAGNOSIS — D485 Neoplasm of uncertain behavior of skin: Secondary | ICD-10-CM | POA: Diagnosis not present

## 2018-04-14 ENCOUNTER — Ambulatory Visit (HOSPITAL_COMMUNITY)
Admission: RE | Admit: 2018-04-14 | Discharge: 2018-04-14 | Disposition: A | Discharge status: Home / Self Care | Payer: Medicare Other | Source: Ambulatory Visit | Attending: Urology | Admitting: Urology

## 2018-04-14 ENCOUNTER — Other Ambulatory Visit (HOSPITAL_COMMUNITY): Payer: Self-pay | Admitting: Urology

## 2018-04-14 ENCOUNTER — Other Ambulatory Visit: Payer: Self-pay

## 2018-04-14 DIAGNOSIS — N200 Calculus of kidney: Secondary | ICD-10-CM | POA: Diagnosis not present

## 2018-04-14 DIAGNOSIS — N201 Calculus of ureter: Secondary | ICD-10-CM

## 2018-04-22 ENCOUNTER — Other Ambulatory Visit: Payer: Self-pay

## 2018-04-22 ENCOUNTER — Ambulatory Visit (INDEPENDENT_AMBULATORY_CARE_PROVIDER_SITE_OTHER): Payer: Medicare Other | Admitting: Urology

## 2018-04-22 DIAGNOSIS — C61 Malignant neoplasm of prostate: Secondary | ICD-10-CM | POA: Diagnosis not present

## 2018-04-22 DIAGNOSIS — N200 Calculus of kidney: Secondary | ICD-10-CM

## 2018-04-22 DIAGNOSIS — Z8546 Personal history of malignant neoplasm of prostate: Secondary | ICD-10-CM

## 2018-04-22 DIAGNOSIS — R3915 Urgency of urination: Secondary | ICD-10-CM | POA: Diagnosis not present

## 2018-06-24 DIAGNOSIS — S66911A Strain of unspecified muscle, fascia and tendon at wrist and hand level, right hand, initial encounter: Secondary | ICD-10-CM | POA: Diagnosis not present

## 2018-06-24 DIAGNOSIS — M654 Radial styloid tenosynovitis [de Quervain]: Secondary | ICD-10-CM | POA: Diagnosis not present

## 2018-06-24 DIAGNOSIS — Z6838 Body mass index (BMI) 38.0-38.9, adult: Secondary | ICD-10-CM | POA: Diagnosis not present

## 2018-07-09 ENCOUNTER — Other Ambulatory Visit: Payer: Medicare Other

## 2018-07-09 ENCOUNTER — Other Ambulatory Visit: Payer: Self-pay

## 2018-07-09 DIAGNOSIS — Z20822 Contact with and (suspected) exposure to covid-19: Secondary | ICD-10-CM

## 2018-07-09 DIAGNOSIS — R6889 Other general symptoms and signs: Secondary | ICD-10-CM | POA: Diagnosis not present

## 2018-07-11 LAB — NOVEL CORONAVIRUS, NAA: SARS-CoV-2, NAA: NOT DETECTED

## 2018-08-18 DIAGNOSIS — H25813 Combined forms of age-related cataract, bilateral: Secondary | ICD-10-CM | POA: Diagnosis not present

## 2018-08-18 DIAGNOSIS — E119 Type 2 diabetes mellitus without complications: Secondary | ICD-10-CM | POA: Diagnosis not present

## 2018-08-28 DIAGNOSIS — Z6838 Body mass index (BMI) 38.0-38.9, adult: Secondary | ICD-10-CM | POA: Diagnosis not present

## 2018-08-28 DIAGNOSIS — Z1389 Encounter for screening for other disorder: Secondary | ICD-10-CM | POA: Diagnosis not present

## 2018-08-28 DIAGNOSIS — Z0000 Encounter for general adult medical examination without abnormal findings: Secondary | ICD-10-CM | POA: Diagnosis not present

## 2018-08-28 DIAGNOSIS — I10 Essential (primary) hypertension: Secondary | ICD-10-CM | POA: Diagnosis not present

## 2018-08-28 DIAGNOSIS — E119 Type 2 diabetes mellitus without complications: Secondary | ICD-10-CM | POA: Diagnosis not present

## 2018-08-28 DIAGNOSIS — J302 Other seasonal allergic rhinitis: Secondary | ICD-10-CM | POA: Diagnosis not present

## 2018-11-18 DIAGNOSIS — Z23 Encounter for immunization: Secondary | ICD-10-CM | POA: Diagnosis not present

## 2018-12-19 ENCOUNTER — Other Ambulatory Visit: Payer: Self-pay

## 2018-12-19 DIAGNOSIS — Z20822 Contact with and (suspected) exposure to covid-19: Secondary | ICD-10-CM

## 2018-12-19 DIAGNOSIS — Z20828 Contact with and (suspected) exposure to other viral communicable diseases: Secondary | ICD-10-CM | POA: Diagnosis not present

## 2018-12-21 LAB — NOVEL CORONAVIRUS, NAA: SARS-CoV-2, NAA: NOT DETECTED

## 2019-03-10 DIAGNOSIS — E7849 Other hyperlipidemia: Secondary | ICD-10-CM | POA: Diagnosis not present

## 2019-03-10 DIAGNOSIS — I10 Essential (primary) hypertension: Secondary | ICD-10-CM | POA: Diagnosis not present

## 2019-03-10 DIAGNOSIS — Z6838 Body mass index (BMI) 38.0-38.9, adult: Secondary | ICD-10-CM | POA: Diagnosis not present

## 2019-03-10 DIAGNOSIS — J329 Chronic sinusitis, unspecified: Secondary | ICD-10-CM | POA: Diagnosis not present

## 2019-03-10 DIAGNOSIS — E119 Type 2 diabetes mellitus without complications: Secondary | ICD-10-CM | POA: Diagnosis not present

## 2019-03-11 ENCOUNTER — Ambulatory Visit (INDEPENDENT_AMBULATORY_CARE_PROVIDER_SITE_OTHER): Payer: Medicare Other | Admitting: Gastroenterology

## 2019-03-11 ENCOUNTER — Encounter (INDEPENDENT_AMBULATORY_CARE_PROVIDER_SITE_OTHER): Payer: Self-pay | Admitting: Gastroenterology

## 2019-03-11 ENCOUNTER — Other Ambulatory Visit: Payer: Self-pay

## 2019-03-11 VITALS — BP 157/99 | HR 83 | Temp 97.9°F | Ht 70.0 in | Wt 261.0 lb

## 2019-03-11 DIAGNOSIS — K219 Gastro-esophageal reflux disease without esophagitis: Secondary | ICD-10-CM | POA: Diagnosis not present

## 2019-03-11 DIAGNOSIS — R1310 Dysphagia, unspecified: Secondary | ICD-10-CM

## 2019-03-11 DIAGNOSIS — Z6838 Body mass index (BMI) 38.0-38.9, adult: Secondary | ICD-10-CM | POA: Diagnosis not present

## 2019-03-11 DIAGNOSIS — E119 Type 2 diabetes mellitus without complications: Secondary | ICD-10-CM | POA: Diagnosis not present

## 2019-03-11 DIAGNOSIS — I10 Essential (primary) hypertension: Secondary | ICD-10-CM | POA: Diagnosis not present

## 2019-03-11 NOTE — Progress Notes (Signed)
Patient profile: James Dyer is a 74 y.o. male seen for evaluation of dysphagia, last seen in clinic 2019 and had a barium swallow as below.   History of Present Illness: James Dyer is seen today for for continued symptoms since 2019.  He reports feeling that foods will sometimes stop and sit halfway down his esophagus.  He frequently has to use liquids to push foods down.  States he tolerates pills okay.  No issues with liquids alone.  Also has volume regurgitation when he is belching up digested food, this can occur almost daily, he has focused on trying to eat less which has helped.  He has not previously been on acid reflux medicine recently.  His PCP did prescribe him Protonix and he picked this up this morning.  Per chart review he was on Protonix in 2019 but unsure when this was stopped.  He denies any tobacco.  No frequent NSAIDs.  Very occasional social alcohol.  He has a bowel movement daily or twice a day.  He has no rectal bleeding or melena.  No lower abdominal pain.   Wt Readings from Last 3 Encounters:  03/11/19 261 lb (118.4 kg)  05/08/17 255 lb (115.7 kg)  03/04/17 248 lb 8 oz (112.7 kg)     Last Colonoscopy: 2014-no polyps. Per report done for history of colon polyps, recommendation is for 10-year repeat.  Denies family history of colon colon cancer. Last Endoscopy: None prior   Past Medical History:  Past Medical History:  Diagnosis Date  . Arthritis    HNP- lumbar  . Borderline diabetes   . Cancer [facility]) 2013   prostate , treated /w radiation   . Diabetes mellitus without complication ([place])   . 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 Hepatitis    "pt told had been exposed to hepatitis when he went to donate blood"  . History of kidney stones   . History of radiation therapy 04/12/11 - 06/06/11   prostate, seminal vesicles  . History of stress test    Echocardiogram done - 03/2013  .  Hypercholesterolemia    states Dr. Armandina Gemma Rx Welchol- pt. hs stopped taking because he believe it gives him  leg cramps.  . Hypertension   . Nephrolithiasis    renal calculi- , also has been told that he has a cyst on kidney - R side   . Neuromuscular disorder ([place])    bilateral foot drop- both feet     Problem List: Patient Active Problem List   Diagnosis Date Noted  . HNP (herniated nucleus pulposus), lumbar 09/01/2013  . HTN (hypertension) 03/19/2013  . Hyperlipemia 03/19/2013  . Diabetes type 2, controlled ([place]) 03/19/2013  . Chest pain 03/19/2013  . Pain in joint, shoulder region 10/23/2012  . Muscle weakness (generalized) 10/23/2012  . S/P rotator cuff repair 10/23/2012  . Complete tear of rotator cuff 09/09/2012  . History of radiation therapy   . Prostate cancer ([place]) 12/18/2010    Past Surgical History: Past Surgical History:  Procedure Laterality Date  . BACK SURGERY    . COLONOSCOPY N/A 07/22/2012   Procedure: COLONOSCOPY;  Surgeon: [name], MD;  [facility];  Service: Gastroenterology;  Laterality: N/A;  . EXTRACORPOREAL SHOCK WAVE LITHOTRIPSY Left 03/04/2017   Procedure: LEFT EXTRACORPOREAL SHOCK WAVE LITHOTRIPSY (ESWL);  Surgeon: [name], MD;  Location[facility];  Service: Urology;  Laterality:  Left;  . FOOT SURGERY Right 1980's   calcium deposit removed  . LUMBAR LAMINECTOMY/DECOMPRESSION MICRODISCECTOMY Right 09/01/2013   Procedure: RIGHT LUMBAR FOUR-FIVE LAMINECTOMY/DECOMPRESSION MICRODISCECTOMY 1 LEVEL;  Surgeon: [name], MD;  Location: [place] NEURO ORS;  Service: Neurosurgery;  Laterality: Right;  Right L45 microdiskectomy  . prostate biopsy  2013  . SHOULDER OPEN ROTATOR CUFF REPAIR Left 09/09/2012   Procedure: LEFT ROTATOR CUFF REPAIR SHOULDER OPEN;  Surgeon: [name], MD;  [facility];  Service: Orthopedics;  Laterality: Left;    Allergies: Allergies  Allergen Reactions  . Latex Other (See Comments)    "sores  in mouth after teeth cleaned"  . Welchol [Colesevelam Hcl]     Leg cramps      Home Medications:  Current Outpatient Medications:  .  losartan (COZAAR) 100 MG tablet, Take 100 mg by mouth daily., Disp: , Rfl:  .  metFORMIN (GLUCOPHAGE) 500 MG tablet, Take 500 mg by mouth 2 (two) times daily with a meal. , Disp: , Rfl:  .  Multiple Vitamins-Minerals (CENTRUM SILVER PO), Take by mouth daily., Disp: , Rfl:  .  oxybutynin (DITROPAN) 5 MG tablet, Take 5 mg by mouth every 6 (six) hours as needed., Disp: , Rfl:  .  pantoprazole (PROTONIX) 40 MG tablet, Take 40 mg by mouth daily., Disp: , Rfl:  .  Pseudoephedrine HCl (SUDAFED 24 HOUR PO), Take by mouth. Patient will take 1 at night as needed for congestion., Disp: , Rfl:  .  sildenafil (REVATIO) 20 MG tablet, Take 20 mg by mouth daily as needed. erectile dysfunction, Disp: , Rfl:  .  sodium chloride (OCEAN) 0.65 % nasal spray, Place 1 spray into the nose 2 (two) times daily as needed for congestion., Disp: , Rfl:  .  vitamin B-12 (CYANOCOBALAMIN) 1000 MCG tablet, Take 1,000 mcg by mouth daily., Disp: , Rfl:  .  Xylitol (XYLIMELTS) 550 MG DISK, Use as directed in the mouth or throat. Patient has uses once a day and once at night., Disp: , Rfl:    Family History: family history is not on file.    Social History:   reports that he quit smoking about 52 years ago. His smoking use included cigarettes. He has a 5.00 pack-year smoking history. He has never used smokeless tobacco. He reports current alcohol use. He reports that he does not use drugs.   Review of Systems: Constitutional: Denies weight loss/weight gain  Eyes: No changes in vision. ENT: No oral lesions, sore throat.  GI: see HPI.  Heme/Lymph: No easy bruising.  CV: No chest pain.  GU: No hematuria.  Integumentary: No rashes.  Neuro: No headaches.  Psych: No depression/anxiety.  Endocrine: No heat/cold intolerance.  Allergic/Immunologic: No urticaria.  Resp: No cough, SOB.    Musculoskeletal: No joint swelling.    Physical Examination: BP (!) 157/99 (BP Location: Right Arm, Patient Position: Sitting, Cuff Size: Large)   Pulse 83   Temp 97.9 F (36.6 C) (Temporal)   Ht 5\' 10"  (1.778 m)   Wt 261 lb (118.4 kg)   BMI 37.45 kg/m  Gen: NAD, alert and oriented x 4 HEENT: PEERLA, EOMI, Neck: supple, no JVD Chest: CTA bilaterally, no wheezes, crackles, or other adventitious sounds CV: RRR, no m/g/c/r Abd: soft, NT, ND, +BS in all four quadrants; no HSM, guarding, ridigity, or rebound tenderness Ext: no edema, well perfused with 2+ pulses, Skin: no rash or lesions noted on observed skin Lymph: no noted LAD  Data Reviewed:  Barium swallow 04/2017-IMPRESSION: No esophageal abnormalities identified.  Assessment/Plan: Mr. Butta is a 74 y.o. male    Faisal was seen today for follow-up.  Diagnoses and all orders for this visit:  Dysphagia, unspecified type  Chronic GERD    1.  GERD-he is symptomatic, his PCP prescribed Protonix 40 mg which she is planning to start today.  We also did review diet modifications.  No prior endoscopy needs given his dysphagia.  Suspect his symptoms will improve with PPI.  2.  Dysphagia-no prior endoscopy.  Barium swallow 2 years ago without abnormalities. Needs EGD for evaluation.  He denies prior issues with sedation.Patient denies CP, SOB, and use of blood thinners. I discussed the risks and benefits of procedure including bleeding, perforation, infection, missed lesions, medication reactions and possible hospitalization or surgery if complications. All questions answered.   3. Hx of colon polyps-10-year repeat was recommended in 2014. No polyps in 2014 but indication was hx of polyps. Not able to find prior path to determine prior polyp type. Will plan to repeat 2024.    Can follow-up in office after endoscopy.  I personally performed the service, non-incident to. (WP)  Laurine Blazer, [facility]  [facility]astro[facility]

## 2019-03-11 NOTE — Patient Instructions (Addendum)
We will contact you about scheduling endoscopy with covid restrictions.  In interim make sure you are remaining upright after meals.  Start Protonix as prescribed by PCP.  Limit coffee sodas etc.

## 2019-03-12 ENCOUNTER — Other Ambulatory Visit (INDEPENDENT_AMBULATORY_CARE_PROVIDER_SITE_OTHER): Payer: Self-pay | Admitting: *Deleted

## 2019-03-12 ENCOUNTER — Encounter (INDEPENDENT_AMBULATORY_CARE_PROVIDER_SITE_OTHER): Payer: Self-pay | Admitting: *Deleted

## 2019-03-12 DIAGNOSIS — K219 Gastro-esophageal reflux disease without esophagitis: Secondary | ICD-10-CM

## 2019-03-12 DIAGNOSIS — R1310 Dysphagia, unspecified: Secondary | ICD-10-CM

## 2019-03-25 DIAGNOSIS — J343 Hypertrophy of nasal turbinates: Secondary | ICD-10-CM | POA: Diagnosis not present

## 2019-03-25 DIAGNOSIS — J310 Chronic rhinitis: Secondary | ICD-10-CM | POA: Diagnosis not present

## 2019-04-02 ENCOUNTER — Ambulatory Visit: Payer: Medicare Other | Attending: Internal Medicine

## 2019-04-02 DIAGNOSIS — Z23 Encounter for immunization: Secondary | ICD-10-CM | POA: Insufficient documentation

## 2019-04-02 NOTE — Progress Notes (Signed)
   Covid-19 Vaccination Clinic  Name:  James Dyer    MRN: [id number] DOB: Mar 06, 1945  04/02/2019  Mr. James Dyer was observed post Covid-19 immunization for 15 minutes without incident. He was provided with Vaccine Information Sheet and instruction to access the V-Safe system.   Mr. James Dyer was instructed to call 911 with any severe reactions post vaccine: Marland Kitchen Difficulty breathing  . Swelling of face and throat  . A fast heartbeat  . A bad rash all over body  . Dizziness and weakness   Immunizations Administered    Name Date Dose VIS Date Route   Moderna COVID-19 Vaccine 04/02/2019 10:47 AM 0.5 mL 12/30/2018 Intramuscular   Manufacturer: Moderna   Lot: [id number]   GannPO:[id number]

## 2019-04-14 IMAGING — DX DG ABDOMEN 1V
2 series · 2 of 2 positions shown · non-contrast
Comparison: 02/19/2017

CLINICAL DATA: Left renal stone

EXAM:
ABDOMEN - 1 VIEW

[abdomen kub (1 of 2)]
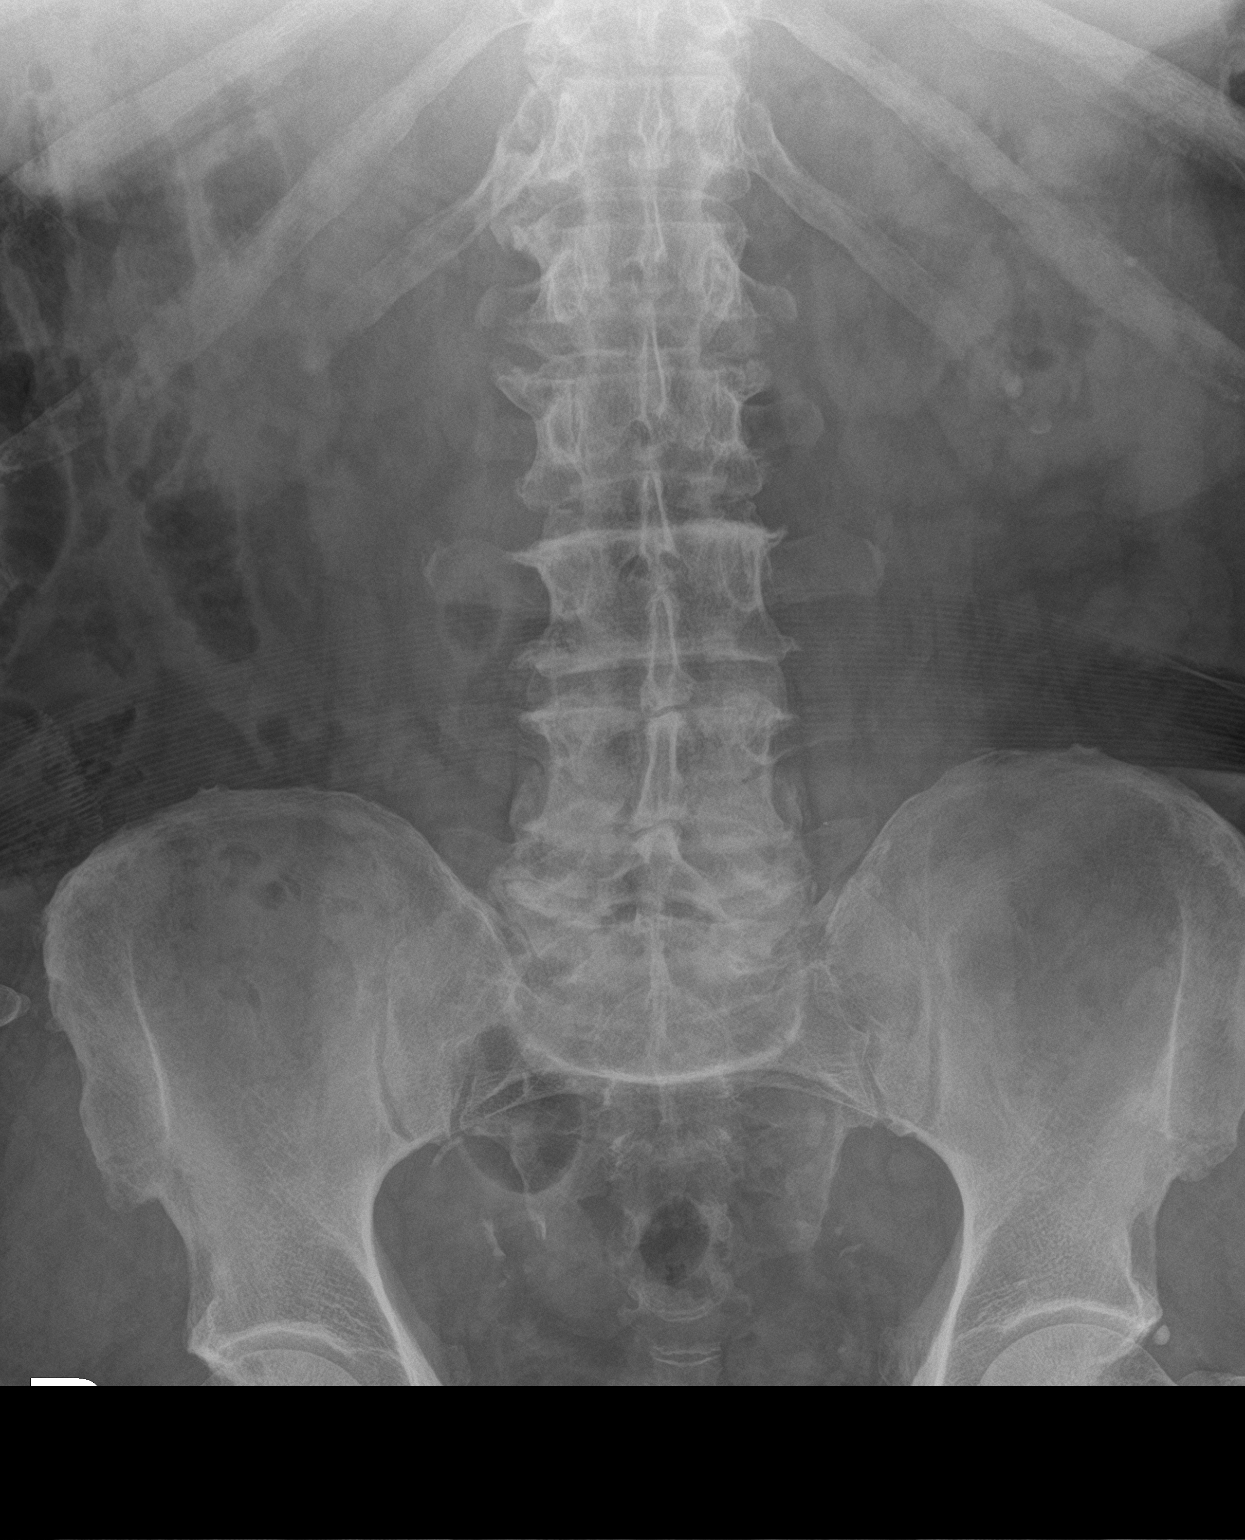

[abdomen kub (2 of 2)]
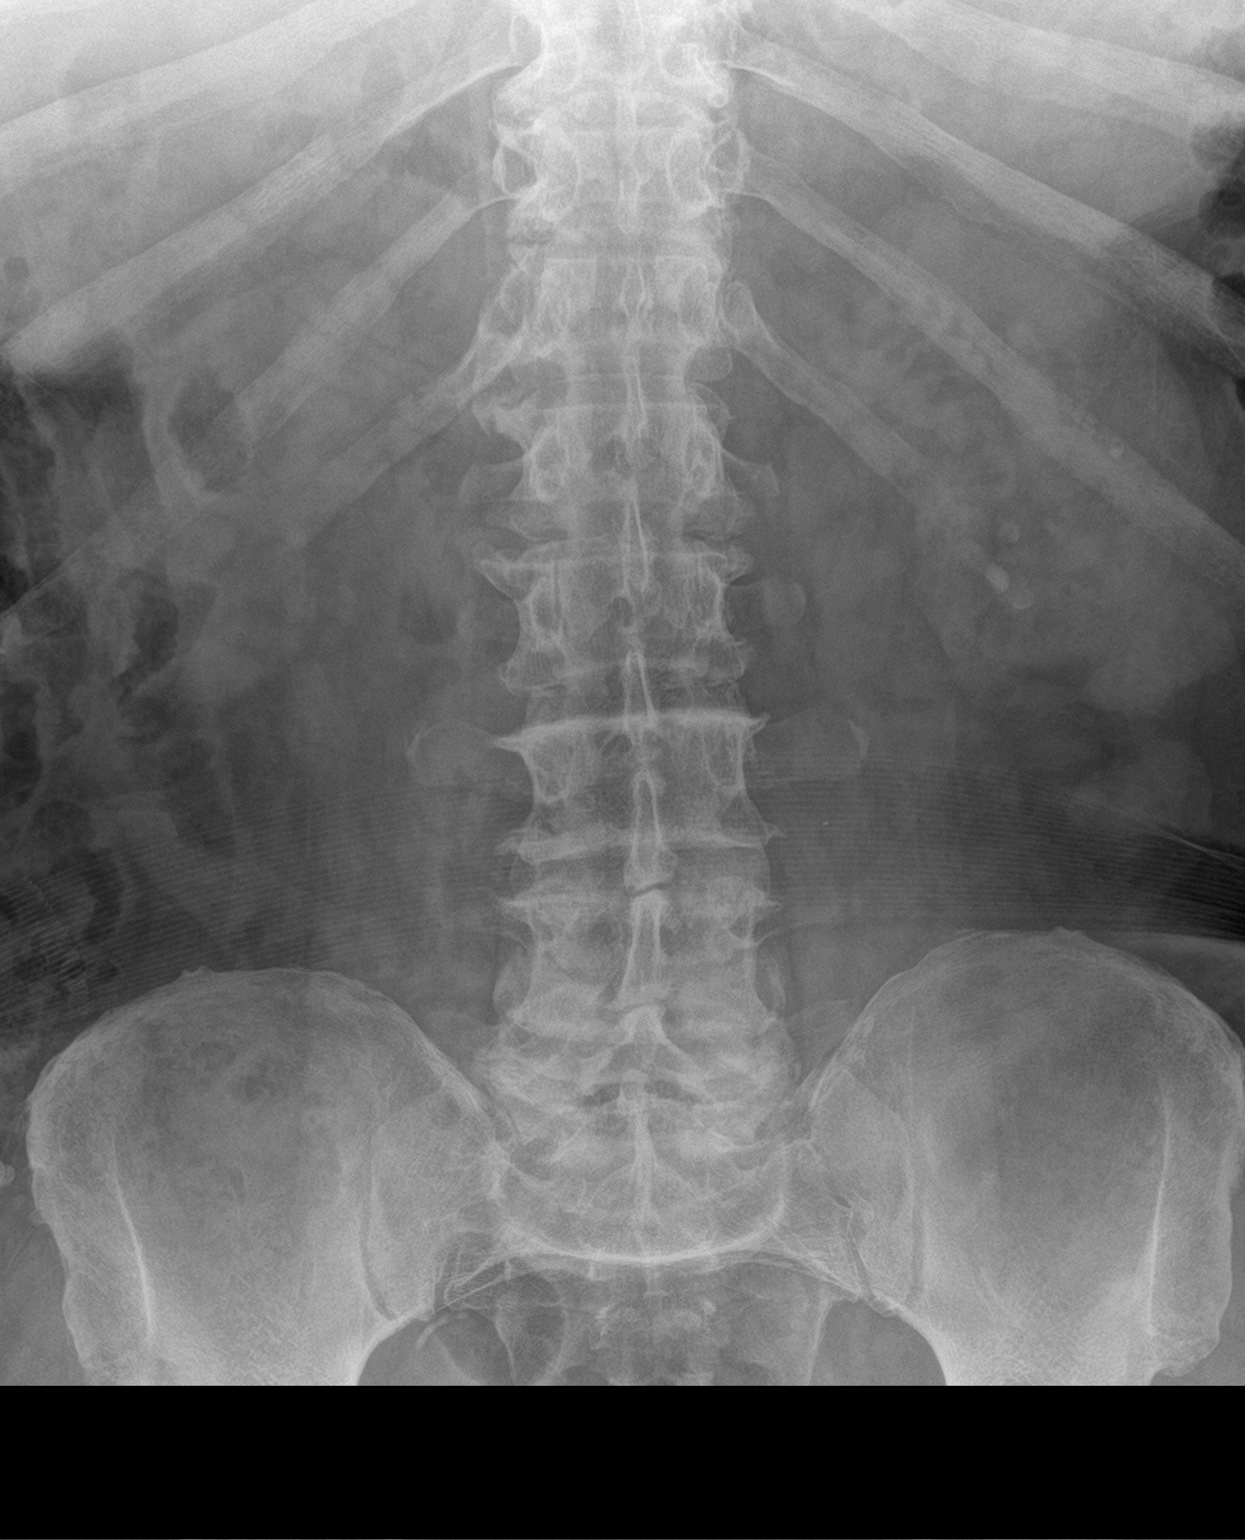

[2 of 2 positions shown; findings below may reference images not displayed]

FINDINGS: 8 mm calculus overlying the left lower kidney.

No definite ureteral calculi are visualized.

Degenerative changes of the lumbar spine.
IMPRESSION: 8 mm calculus overlying the left lower kidney.

## 2019-04-20 ENCOUNTER — Other Ambulatory Visit (HOSPITAL_COMMUNITY)
Admission: RE | Admit: 2019-04-20 | Discharge: 2019-04-20 | Disposition: A | Discharge status: Home / Self Care | Payer: Medicare Other | Source: Ambulatory Visit | Attending: Internal Medicine | Admitting: Internal Medicine

## 2019-04-20 ENCOUNTER — Other Ambulatory Visit: Payer: Self-pay

## 2019-04-20 DIAGNOSIS — Z20822 Contact with and (suspected) exposure to covid-19: Secondary | ICD-10-CM | POA: Insufficient documentation

## 2019-04-20 DIAGNOSIS — Z01812 Encounter for preprocedural laboratory examination: Secondary | ICD-10-CM | POA: Insufficient documentation

## 2019-04-20 LAB — SARS CORONAVIRUS 2 (TAT 6-24 HRS): SARS Coronavirus 2: NEGATIVE

## 2019-04-22 ENCOUNTER — Other Ambulatory Visit: Payer: Self-pay

## 2019-04-22 ENCOUNTER — Encounter (HOSPITAL_COMMUNITY): Payer: Self-pay | Admitting: Internal Medicine

## 2019-04-22 ENCOUNTER — Ambulatory Visit (HOSPITAL_COMMUNITY)
Admission: RE | Admit: 2019-04-22 | Discharge: 2019-04-22 | Disposition: A | Discharge status: Home / Self Care | Payer: Medicare Other | Attending: Internal Medicine | Admitting: Internal Medicine

## 2019-04-22 ENCOUNTER — Encounter (HOSPITAL_COMMUNITY)
Admission: RE | Disposition: A | Discharge status: Home / Self Care | Payer: Self-pay | Source: Home / Self Care | Attending: Internal Medicine

## 2019-04-22 DIAGNOSIS — Z888 Allergy status to other drugs, medicaments and biological substances status: Secondary | ICD-10-CM | POA: Insufficient documentation

## 2019-04-22 DIAGNOSIS — Z79899 Other long term (current) drug therapy: Secondary | ICD-10-CM | POA: Diagnosis not present

## 2019-04-22 DIAGNOSIS — Q6689 Other  specified congenital deformities of feet: Secondary | ICD-10-CM | POA: Insufficient documentation

## 2019-04-22 DIAGNOSIS — Z91040 Latex allergy status: Secondary | ICD-10-CM | POA: Diagnosis not present

## 2019-04-22 DIAGNOSIS — K228 Other specified diseases of esophagus: Secondary | ICD-10-CM | POA: Diagnosis not present

## 2019-04-22 DIAGNOSIS — R1314 Dysphagia, pharyngoesophageal phase: Secondary | ICD-10-CM | POA: Insufficient documentation

## 2019-04-22 DIAGNOSIS — K219 Gastro-esophageal reflux disease without esophagitis: Secondary | ICD-10-CM | POA: Diagnosis not present

## 2019-04-22 DIAGNOSIS — E7800 Pure hypercholesterolemia, unspecified: Secondary | ICD-10-CM | POA: Insufficient documentation

## 2019-04-22 DIAGNOSIS — E119 Type 2 diabetes mellitus without complications: Secondary | ICD-10-CM | POA: Diagnosis not present

## 2019-04-22 DIAGNOSIS — Z7984 Long term (current) use of oral hypoglycemic drugs: Secondary | ICD-10-CM | POA: Diagnosis not present

## 2019-04-22 DIAGNOSIS — Z87891 Personal history of nicotine dependence: Secondary | ICD-10-CM | POA: Diagnosis not present

## 2019-04-22 DIAGNOSIS — I10 Essential (primary) hypertension: Secondary | ICD-10-CM | POA: Diagnosis not present

## 2019-04-22 DIAGNOSIS — M1990 Unspecified osteoarthritis, unspecified site: Secondary | ICD-10-CM | POA: Insufficient documentation

## 2019-04-22 DIAGNOSIS — R1310 Dysphagia, unspecified: Secondary | ICD-10-CM

## 2019-04-22 DIAGNOSIS — K759 Inflammatory liver disease, unspecified: Secondary | ICD-10-CM | POA: Diagnosis not present

## 2019-04-22 HISTORY — PX: ESOPHAGEAL DILATION: SHX303

## 2019-04-22 HISTORY — PX: ESOPHAGOGASTRODUODENOSCOPY: SHX5428

## 2019-04-22 LAB — GLUCOSE, CAPILLARY: Glucose-Capillary: 101 mg/dL — ABNORMAL HIGH (ref 70–99)

## 2019-04-22 SURGERY — EGD (ESOPHAGOGASTRODUODENOSCOPY)
Anesthesia: Moderate Sedation

## 2019-04-22 MED ORDER — MEPERIDINE HCL 50 MG/ML IJ SOLN
INTRAMUSCULAR | Status: AC
  Filled 2019-04-22: qty 1

## 2019-04-22 MED ORDER — LIDOCAINE VISCOUS HCL 2 % MT SOLN
OROMUCOSAL | Status: DC | PRN
  Administered 2019-04-22: 4 mL via OROMUCOSAL

## 2019-04-22 MED ORDER — MEPERIDINE HCL 50 MG/ML IJ SOLN
INTRAMUSCULAR | Status: DC | PRN
  Administered 2019-04-22 (×2): 25 mg via INTRAVENOUS

## 2019-04-22 MED ORDER — MIDAZOLAM HCL 5 MG/5ML IJ SOLN
INTRAMUSCULAR | Status: DC | PRN
  Administered 2019-04-22 (×2): 2 mg via INTRAVENOUS

## 2019-04-22 MED ORDER — STERILE WATER FOR IRRIGATION IR SOLN
Status: DC | PRN
  Administered 2019-04-22: 1.5 mL

## 2019-04-22 MED ORDER — SODIUM CHLORIDE 0.9 % IV SOLN
INTRAVENOUS | Status: DC
  Administered 2019-04-22: 13:00:00 1000 mL via INTRAVENOUS

## 2019-04-22 MED ORDER — LIDOCAINE VISCOUS HCL 2 % MT SOLN
OROMUCOSAL | Status: AC
  Filled 2019-04-22: qty 15

## 2019-04-22 MED ORDER — MIDAZOLAM HCL 5 MG/5ML IJ SOLN
INTRAMUSCULAR | Status: AC
  Filled 2019-04-22: qty 10

## 2019-04-22 NOTE — Op Note (Signed)
[facility] Patient Name: James Dyer Procedure Date: 04/22/2019 1:33 PM MRN: [id number] Date of Birth: Nov 17, 1945 Attending MD: Hildred Laser , MD CSN: [id number] Age: 74 Admit Type: Outpatient Procedure:                Upper GI endoscopy Indications:              Esophageal dysphagia, Gastro-esophageal reflux                            disease Providers:                Hildred Laser, MD, Otis Peak B. Sharon Seller, RN, Raphael Gibney, Technician Referring MD:             Halford Chessman, MD Medicines:                Lidocaine spray, Meperidine 50 mg IV, Midazolam 4                            mg IV Complications:            No immediate complications. Estimated Blood Loss:     Estimated blood loss: none. Procedure:                Pre-Anesthesia Assessment:                           - Prior to the procedure, a History and Physical                            was performed, and patient medications and                            allergies were reviewed. The patient's tolerance of                            previous anesthesia was also reviewed. The risks                            and benefits of the procedure and the sedation                            options and risks were discussed with the patient.                            All questions were answered, and informed consent                            was obtained. Prior Anticoagulants: The patient has                            taken no previous anticoagulant or antiplatelet                            agents.  ASA Grade Assessment: II - A patient with                            mild systemic disease. After reviewing the risks                            and benefits, the patient was deemed in                            satisfactory condition to undergo the procedure.                           After obtaining informed consent, the endoscope was                            passed under direct vision. Throughout  the                            procedure, the patient's blood pressure, pulse, and                            oxygen saturations were monitored continuously. The                            [id number]) was introduced through the                            mouth, and advanced to the second part of duodenum.                            The upper GI endoscopy was accomplished without                            difficulty. The patient tolerated the procedure                            well. Scope In: 2:04:01 PM Scope Out: 2:12:54 PM Total Procedure Duration: 0 hours 8 minutes 53 seconds  Findings:      The hypopharynx was normal.      The examined esophagus was normal.      The Z-line was regular and was found 43 cm from the incisors.      No endoscopic abnormality was evident in the esophagus to explain the       patient's complaint of dysphagia. It was decided, however, to proceed       with dilation of the entire esophagus. The scope was withdrawn. Dilation       was performed with a Maloney dilator with mild resistance at 56 Fr. The       dilation site was examined following endoscope reinsertion and showed no       change and no bleeding, mucosal tear or perforation.      The entire examined stomach was normal.      The duodenal bulb and second portion of the duodenum were normal. Impression:               -  Normal hypopharynx.                           - Normal esophagus.                           - Z-line regular, 43 cm from the incisors.                           - No endoscopic esophageal abnormality to explain                            patient's dysphagia. Esophagus dilated. Dilated.                           - Normal stomach.                           - Normal duodenal bulb and second portion of the                            duodenum.                           - No specimens collected.                           Comment: since no structural abnormality noted,                             suspect we are dealing with esophageal dysmotility.                           if dysphagia persists will consider esophageal                            manometry. Moderate Sedation:      Moderate (conscious) sedation was administered by the endoscopy nurse       and supervised by the endoscopist. The following parameters were       monitored: oxygen saturation, heart rate, blood pressure, CO2       capnography and response to care. Total physician intraservice time was       13 minutes. Recommendation:           - Patient has a contact number available for                            emergencies. The signs and symptoms of potential                            delayed complications were discussed with the                            patient. Return to normal activities tomorrow.                            Written discharge instructions were provided to  the                            patient.                           - Patient has a contact number available for                            emergencies. The signs and symptoms of potential                            delayed complications were discussed with the                            patient. Return to normal activities tomorrow.                            Written discharge instructions were provided to the                            patient.                           - Resume previous diet today.                           - Continue present medications.                           - Telephone GI clinic in 1 week. Procedure Code(s):        --- Professional ---                           [phone], Esophagogastroduodenoscopy, flexible,                            transoral; diagnostic, including collection of                            specimen(s) by brushing or washing, when performed                            (separate procedure)                           43450, Dilation of esophagus, by unguided sound or                             bougie, single or multiple passes                           G0500, Moderate sedation services provided by the                            same physician or other qualified health care  professional performing a gastrointestinal                            endoscopic service that sedation supports,                            requiring the presence of an independent trained                            observer to assist in the monitoring of the                            patient's level of consciousness and physiological                            status; initial 15 minutes of intra-service time;                            patient age 33 years or older (additional time may                            be reported with [phone], as appropriate) Diagnosis Code(s):        --- Professional ---                           R13.14, Dysphagia, pharyngoesophageal phase                           K21.9, Gastro-esophageal reflux disease without                            esophagitis CPT copyright 2019 American Medical Association. All rights reserved. The codes documented in this report are preliminary and upon coder review may  be revised to meet current compliance requirements. Hildred Laser, MD Hildred Laser, MD 04/22/2019 2:21:23 PM This report has been signed electronically. Number of Addenda: 0

## 2019-04-22 NOTE — Discharge Instructions (Signed)
Resume usual medications and diet as before. Remember to eat slowly and chew your food thoroughly. No driving for 24 hours. Please call office with progress report in 1 week.     Upper Endoscopy, Adult, Care After This sheet gives you information about how to care for yourself after your procedure. Your health care provider may also give you more specific instructions. If you have problems or questions, contact your health care provider. What can I expect after the procedure? After the procedure, it is common to have:  A sore throat.  Mild stomach pain or discomfort.  Bloating.  Nausea. Follow these instructions at home:   Follow instructions from your health care provider about what to eat or drink after your procedure.  Return to your normal activities as told by your health care provider. Ask your health care provider what activities are safe for you.  Take over-the-counter and prescription medicines only as told by your health care provider.  Do not drive for 24 hours if you were given a sedative during your procedure.  Keep all follow-up visits as told by your health care provider. This is important. Contact a health care provider if you have:  A sore throat that lasts longer than one day.  Trouble swallowing. Get help right away if:  You vomit blood or your vomit looks like coffee grounds.  You have: ? A fever. ? Bloody, black, or tarry stools. ? A severe sore throat or you cannot swallow. ? Difficulty breathing. ? Severe pain in your chest or abdomen. Summary  After the procedure, it is common to have a sore throat, mild stomach discomfort, bloating, and nausea.  Do not drive for 24 hours if you were given a sedative during the procedure.  Follow instructions from your health care provider about what to eat or drink after your procedure.  Return to your normal activities as told by your health care provider. This information is not intended to replace  advice given to you by your health care provider. Make sure you discuss any questions you have with your health care provider. Document Revised: 07/09/2017 Document Reviewed: 06/17/2017 Elsevier Patient Education  [place].

## 2019-04-22 NOTE — H&P (Signed)
James Dyer is an 74 y.o. male.   Chief Complaint: Patient is here for esophagogastroduodenoscopy and esophageal dilation. HPI: Patient is 74 year old Caucasian male who has chronic GERD who presents with over 2-year history of dysphagia to solids.  He had barium study in April 2019 revealed no abnormality.  Barium pill passed from oral cavity to stomach without any delay.  He is having heartburn couple times a week but he does not watch his diet.  He points to suprasternal area site of bolus obstruction.  He says he waits about an bolus eventually goes down.  He has good appetite and has not lost any weight.  He denies epigastric pain or melena.  Past Medical History:  Diagnosis Date  . Arthritis    HNP- lumbar  . Borderline diabetes   . Cancer [facility]) 2013   prostate , treated /w radiation   . Diabetes mellitus without complication ([place])   . Foot drop, bilateral GERD since birthpt wears braces in shoes   pt must have braces to walk  . Hepatitis    "pt told had been exposed to hepatitis when he went to donate blood"  . History of kidney stones   . History of radiation therapy 04/12/11 - 06/06/11   prostate, seminal vesicles  . History of stress test    Echocardiogram done - 03/2013  . Hypercholesterolemia    states Dr. Armandina Gemma Rx Welchol- pt. hs stopped taking because he believe it gives him  leg cramps.  . Hypertension   . Nephrolithiasis    renal calculi- , also has been told that he has a cyst on kidney - R side   . Neuromuscular disorder ([place])    bilateral foot drop- both feet     Past Surgical History:  Procedure Laterality Date  . BACK SURGERY    . COLONOSCOPY N/A 07/22/2012   Procedure: COLONOSCOPY;  Surgeon: [name], MD;  [facility];  Service: Gastroenterology;  Laterality: N/A;  . EXTRACORPOREAL SHOCK WAVE LITHOTRIPSY Left 03/04/2017   Procedure: LEFT EXTRACORPOREAL SHOCK WAVE LITHOTRIPSY (ESWL);  Surgeon: [name], MD;  Location: [facility];   Service: Urology;  Laterality: Left;  . FOOT SURGERY Right 1980's   calcium deposit removed  . LUMBAR LAMINECTOMY/DECOMPRESSION MICRODISCECTOMY Right 09/01/2013   Procedure: RIGHT LUMBAR FOUR-FIVE LAMINECTOMY/DECOMPRESSION MICRODISCECTOMY 1 LEVEL;  Surgeon: [name], MD;  Location: [facility];  Service: Neurosurgery;  Laterality: Right;  Right L45 microdiskectomy  . prostate biopsy  2013  . SHOULDER OPEN ROTATOR CUFF REPAIR Left 09/09/2012   Procedure: LEFT ROTATOR CUFF REPAIR SHOULDER OPEN;  Surgeon: [name], MD;  Location: [facility];  Service: Orthopedics;  Laterality: Left;    History reviewed. No pertinent family history. Social History:  reports that he quit smoking about 52 years ago. His smoking use included cigarettes. He has a 5.00 pack-year smoking history. He has never used smokeless tobacco. He reports current alcohol use. He reports that he does not use drugs.  Allergies:  Allergies  Allergen Reactions  . Latex Other (See Comments)    "sores in mouth after teeth cleaned"  . Welchol [Colesevelam Hcl]     Leg cramps    Medications Prior to Admission  Medication Sig Dispense Refill  . cholecalciferol (VITAMIN D) 25 MCG (1000 UNIT) tablet Take 1,000 Units by mouth daily with lunch.    . Lidocaine HCl (PAIN RELIEF ROLL-ON EX) Apply 1 application topically daily as needed (applied to feet if needed for  pain.).    Marland Kitchen losartan (COZAAR) 100 MG tablet Take 100 mg by mouth daily.    . metFORMIN (GLUCOPHAGE) 500 MG tablet Take 500 mg by mouth 2 (two) times daily with a meal.     . Multiple Vitamin (MULTIVITAMIN WITH MINERALS) TABS tablet Take 1 tablet by mouth daily.    Marland Kitchen oxybutynin (DITROPAN) 5 MG tablet Take 5 mg by mouth in the morning, at noon, and at bedtime.     . pantoprazole (PROTONIX) 40 MG tablet Take 40 mg by mouth every evening.     . phenylephrine (SUDAFED PE) 10 MG TABS tablet Take 10 mg by mouth every 4 (four) hours as needed (congestion).    . sodium chloride  (OCEAN) 0.65 % nasal spray Place 1 spray into the nose 2 (two) times daily as needed for congestion.    . vitamin B-12 (CYANOCOBALAMIN) 1000 MCG tablet Take 1,000 mcg by mouth every evening.     . Xylitol (XYLIMELTS) 550 MG DISK Use as directed 1 tablet in the mouth or throat in the morning and at bedtime.       No results found for this or any previous visit (from the past 48 hour(s)). No results found.  Review of Systems  Blood pressure (!) 110/98, pulse 70, temperature 98.7 F (37.1 C), temperature source Oral, resp. rate 20, height 5\' 10"  (1.778 m), weight 117.9 kg, SpO2 100 %. Physical Exam  Constitutional: He appears well-developed and well-nourished.  HENT:  Mouth/Throat: Oropharynx is clear and moist.  Eyes: Conjunctivae are normal. No scleral icterus.  Neck: No thyromegaly present.  Cardiovascular: Normal rate, regular rhythm and normal heart sounds.  No murmur heard. Respiratory: Effort normal and breath sounds normal.  GI:  Abdomen is full.  On palpation soft and nontender with no organomegaly or masses.  Musculoskeletal:        General: No edema.     Comments: He has bilateral ankle braces.  Lymphadenopathy:    He has no cervical adenopathy.  Neurological: He is alert.  Skin: Skin is warm and dry.     Assessment/Plan Esophageal dysphagia in patient with chronic GERD. Esophagogastroduodenoscopy with esophageal dilation.  Hildred Laser, MD 04/22/2019, 1:53 PM

## 2019-05-05 ENCOUNTER — Ambulatory Visit: Payer: Medicare Other | Attending: Internal Medicine

## 2019-05-05 DIAGNOSIS — Z23 Encounter for immunization: Secondary | ICD-10-CM

## 2019-05-05 NOTE — Progress Notes (Signed)
   Covid-19 Vaccination Clinic  Name:  James Dyer    MRN: [id number] DOB: 1945-07-15  05/05/2019  [name] was observed post Covid-19 immunization for 15 minutes without incident. He was provided with Vaccine Information Sheet and instruction to access the V-Safe system.   [name] was instructed to call 911 with any severe reactions post vaccine: Marland Kitchen Difficulty breathing  . Swelling of face and throat  . A fast heartbeat  . A bad rash all over body  . Dizziness and weakness   Immunizations Administered    Name Date Dose VIS Date Route   Moderna COVID-19 Vaccine 05/05/2019  8:40 AM 0.5 mL 12/30/2018 Intramuscular   Manufacturer: Moderna   Lot: [id number]   [id number]

## 2019-05-06 DIAGNOSIS — J310 Chronic rhinitis: Secondary | ICD-10-CM | POA: Diagnosis not present

## 2019-05-06 DIAGNOSIS — J343 Hypertrophy of nasal turbinates: Secondary | ICD-10-CM | POA: Diagnosis not present

## 2019-08-20 DIAGNOSIS — H01004 Unspecified blepharitis left upper eyelid: Secondary | ICD-10-CM | POA: Diagnosis not present

## 2019-08-20 DIAGNOSIS — E119 Type 2 diabetes mellitus without complications: Secondary | ICD-10-CM | POA: Diagnosis not present

## 2019-08-20 DIAGNOSIS — H01005 Unspecified blepharitis left lower eyelid: Secondary | ICD-10-CM | POA: Diagnosis not present

## 2019-08-20 DIAGNOSIS — H25813 Combined forms of age-related cataract, bilateral: Secondary | ICD-10-CM | POA: Diagnosis not present

## 2019-09-09 DIAGNOSIS — Z6838 Body mass index (BMI) 38.0-38.9, adult: Secondary | ICD-10-CM | POA: Diagnosis not present

## 2019-09-09 DIAGNOSIS — I10 Essential (primary) hypertension: Secondary | ICD-10-CM | POA: Diagnosis not present

## 2019-09-09 DIAGNOSIS — Z0001 Encounter for general adult medical examination with abnormal findings: Secondary | ICD-10-CM | POA: Diagnosis not present

## 2019-09-09 DIAGNOSIS — R000 Tachycardia, unspecified: Secondary | ICD-10-CM | POA: Diagnosis not present

## 2019-09-09 DIAGNOSIS — K219 Gastro-esophageal reflux disease without esophagitis: Secondary | ICD-10-CM | POA: Diagnosis not present

## 2019-09-09 DIAGNOSIS — E119 Type 2 diabetes mellitus without complications: Secondary | ICD-10-CM | POA: Diagnosis not present

## 2019-09-09 DIAGNOSIS — Z125 Encounter for screening for malignant neoplasm of prostate: Secondary | ICD-10-CM | POA: Diagnosis not present

## 2019-09-09 DIAGNOSIS — E7849 Other hyperlipidemia: Secondary | ICD-10-CM | POA: Diagnosis not present

## 2019-09-09 DIAGNOSIS — Z1389 Encounter for screening for other disorder: Secondary | ICD-10-CM | POA: Diagnosis not present

## 2019-11-05 DIAGNOSIS — Z23 Encounter for immunization: Secondary | ICD-10-CM | POA: Diagnosis not present

## 2019-12-15 NOTE — Progress Notes (Signed)
CARDIOLOGY CONSULT NOTE       Patient ID: James Dyer MRN: [id number] DOB/AGE: April 05, 1945 74 y.o.  Admit date: (Not on file) Referring Physician: [name] Primary Physician: [name], MD Primary Cardiologist: New Reason for Consultation: Afib  Active Problems:   * No active hospital problems. *   HPI:  74 y.o. referred by Dr [name] for tachycardia ? Irregular pulse. History of HTN, HLD, DM and obesity Also with GERD, dysphagia chronic neuromuscular dx bilateral foot drop.   Seen by Dr Bronson Ing for chest pain in 2015.  Previous smoker  Has had myalgias with statin in past Myove abnormal low risk April 01 2013 no ischemia likely diaphragmatic attenuation EF 55% Echo 03/30/13 with EF 55% no significant valve disease   Lab review Hct 42 CR 1.0 Normal LFTls LDL 144 PSA normal TSH 2.6 A1c 7.2   ECG today shows afib. He is unaware of rhythm No chest pain , dyspnea or syncope No contraindications to anticoagulation   This patients CHA2DS2-VASc Score and unadjusted Ischemic Stroke Rate (% per year) is equal to 3.2 % stroke rate/year from a score of 3  Above score calculated as 1 point each if present [CHF, HTN, DM, Vascular=MI/PAD/Aortic Plaque, Age if 65-74, or Male] Above score calculated as 2 points each if present [Age > 75, or Stroke/TIA/TE]  ROS All other systems reviewed and negative except as noted above  Past Medical History:  Diagnosis Date  . Arthritis    HNP- lumbar  . Borderline diabetes   . Cancer [facility]) 2013   prostate , treated /w radiation   . Diabetes mellitus without complication ([place])   . 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 Hepatitis    "pt told had been exposed to hepatitis when he went to donate blood"  . History of kidney stones   . History of radiation therapy 04/12/11 - 06/06/11   prostate, seminal vesicles  . History of stress test    Echocardiogram done - 03/2013  . Hypercholesterolemia     states Dr. Armandina Gemma Rx Welchol- pt. hs stopped taking because he believe it gives him  leg cramps.  . Hypertension   . Nephrolithiasis    renal calculi- , also has been told that he has a cyst on kidney - R side   . Neuromuscular disorder ([place])    bilateral foot drop- both feet     No family history on file.  Social History   Socioeconomic History  . Marital status: Married    Spouse name: Not on file  . Number of children: Not on file  . Years of education: Not on file  . Highest education level: Not on file  Occupational History  . Not on file  Tobacco Use  . Smoking status: Former Smoker    Packs/day: 1.00    Years: 5.00    Pack years: 5.00    Types: Cigarettes    Quit date: 12/18/1966    Years since quitting: 53.0  . Smokeless tobacco: Never Used  Vaping Use  . Vaping Use: Never used  Substance and Sexual Activity  . Alcohol use: Yes    Comment: occasional beer  . Drug use: No  . Sexual activity: Never  Other Topics Concern  . Not on file  Social History Narrative  . Not on file   Social Determinants of Health   Financial Resource Strain:   . Difficulty of Paying  Living Expenses: Not on file  Food Insecurity:   . Worried About Charity fundraiser in the Last Year: Not on file  . Ran Out of Food in the Last Year: Not on file  Transportation Needs:   . Lack of Transportation (Medical): Not on file  . Lack of Transportation (Non-Medical): Not on file  Physical Activity:   . Days of Exercise per Week: Not on file  . Minutes of Exercise per Session: Not on file  Stress:   . Feeling of Stress : Not on file  Social Connections:   . Frequency of Communication with Friends and Family: Not on file  . Frequency of Social Gatherings with Friends and Family: Not on file  . Attends Religious Services: Not on file  . Active Member of Clubs or Organizations: Not on file  . Attends Archivist Meetings: Not on file  . Marital Status: Not on file  Intimate  Partner Violence:   . Fear of Current or Ex-Partner: Not on file  . Emotionally Abused: Not on file  . Physically Abused: Not on file  . Sexually Abused: Not on file    Past Surgical History:  Procedure Laterality Date  . BACK SURGERY    . COLONOSCOPY N/A 07/22/2012   Procedure: COLONOSCOPY;  Surgeon: [name], MD;  [facility];  Service: Gastroenterology;  Laterality: N/A;  . ESOPHAGEAL DILATION N/A 04/22/2019   Procedure: ESOPHAGEAL DILATION;  Surgeon: Rogene Houston, MD;  [facility];  Service: Endoscopy;  Laterality: N/A;  . ESOPHAGOGASTRODUODENOSCOPY N/A 04/22/2019   Procedure: ESOPHAGOGASTRODUODENOSCOPY (EGD);  Surgeon: Rogene Houston, MD;  [facility];  Service: Endoscopy;  Laterality: N/A;  145  . EXTRACORPOREAL SHOCK WAVE LITHOTRIPSY Left 03/04/2017   Procedure: LEFT EXTRACORPOREAL SHOCK WAVE LITHOTRIPSY (ESWL);  Surgeon: [name], MD;  [facility];  Service: Urology;  Laterality: Left;  . FOOT SURGERY Right 1980's   calcium deposit removed  . LUMBAR LAMINECTOMY/DECOMPRESSION MICRODISCECTOMY Right 09/01/2013   Procedure: RIGHT LUMBAR FOUR-FIVE LAMINECTOMY/DECOMPRESSION MICRODISCECTOMY 1 LEVEL;  Surgeon: [name], MD;  Location: [place] [facility] OR[facility];  Service: Neurosurgery;  Laterality: Right;  Right L45 microdiskectomy  . prostate biopsy  2013  . SHOULDER OPEN ROTATOR CUFF REPAIR Left 09/09/2012   Procedure: LEFT ROTATOR CUFF REPAIR SHOULDER OPEN;  Surgeon: [name], MD;  [facility];  Service: Orthopedics;  Laterality: Left;      Current Outpatient Medications:  .  cholecalciferol (VITAMIN D) 25 MCG (1000 UNIT) tablet, Take 1,000 Units by mouth daily with lunch., Disp: , Rfl:  .  Lidocaine HCl (PAIN RELIEF ROLL-ON EX), Apply 1 application topically daily as needed (applied to feet if needed for pain.)., Disp: , Rfl:  .  losartan (COZAAR) 100 MG tablet, Take 100 mg by mouth daily., Disp: , Rfl:  .  metFORMIN  (GLUCOPHAGE) 500 MG tablet, Take 500 mg by mouth 2 (two) times daily with a meal. , Disp: , Rfl:  .  Multiple Vitamin (MULTIVITAMIN WITH MINERALS) TABS tablet, Take 1 tablet by mouth daily., Disp: , Rfl:  .  oxybutynin (DITROPAN) 5 MG tablet, Take 5 mg by mouth in the morning, at noon, and at bedtime. , Disp: , Rfl:  .  pantoprazole (PROTONIX) 40 MG tablet, Take 40 mg by mouth every evening. , Disp: , Rfl:  .  phenylephrine (SUDAFED PE) 10 MG TABS tablet, Take 10 mg by mouth every 4 (four) hours as needed (congestion).,  Disp: , Rfl:  .  sodium chloride (OCEAN) 0.65 % nasal spray, Place 1 spray into the nose 2 (two) times daily as needed for congestion., Disp: , Rfl:  .  vitamin B-12 (CYANOCOBALAMIN) 1000 MCG tablet, Take 1,000 mcg by mouth every evening. , Disp: , Rfl:  .  Xylitol (XYLIMELTS) 550 MG DISK, Use as directed 1 tablet in the mouth or throat in the morning and at bedtime. , Disp: , Rfl:     Physical Exam: There were no vitals taken for this visit.    Affect appropriate Healthy:  appears stated age 20: normal Neck supple with no adenopathy JVP normal no bruits no thyromegaly Lungs clear with no wheezing and good diaphragmatic motion Heart:  S1/S2 no murmur, no rub, gallop or click PMI normal Abdomen: benighn, BS positve, no tenderness, no AAA no bruit.  No HSM or HJR Distal pulses intact with no bruits No edema Bilateral foot drop with braces  Skin warm and dry Bilateral leg splints from foot drop    Labs:   Lab Results  Component Value Date   WBC 7.5 01/18/2017   HGB 13.0 01/18/2017   HCT 40.1 01/18/2017   MCV 90.5 01/18/2017   PLT 230 01/18/2017   No results for input(s): NA, K, CL, CO2, BUN, CREATININE, CALCIUM, PROT, BILITOT, ALKPHOS, ALT, AST, GLUCOSE in the last 168 hours.  Invalid input(s): LABALBU Lab Results  Component Value Date   TROPONINI <0.30 03/14/2012   No results found for: CHOL No results found for: HDL No results found for: LDLCALC No  results found for: TRIG No results found for: CHOLHDL No results found for: LDLDIRECT    Radiology: No results found.  EKG: SR PVC rate 74 otherwise normal 2013   ASSESSMENT AND PLAN:   1. Afib:  No clear onset. Fairly asymptomatic Start lopressor 25 bid for rate control start eliquis 5 bid for stroke prophylaxis. TTE to assess atrial sizes and EF. F/U 3-4 weeks will decide then on strategy of rate control and anticoagulation vs cardioversion  2. DM:  Discussed low carb diet.  Target hemoglobin A1c is 6.5 or less.  Continue current medications. 3.HTN:  Well controlled.  Continue current medications and low sodium Dash type diet.   4. Dysphagia:  Normal EGD April 22 2019 f/u GI    Signed: Jenkins Rouge 12/22/2019, 1:41 PM

## 2019-12-22 ENCOUNTER — Ambulatory Visit (INDEPENDENT_AMBULATORY_CARE_PROVIDER_SITE_OTHER): Payer: Medicare Other | Admitting: Cardiovascular Disease

## 2019-12-22 ENCOUNTER — Other Ambulatory Visit: Payer: Self-pay

## 2019-12-22 ENCOUNTER — Encounter: Payer: Self-pay | Admitting: Cardiovascular Disease

## 2019-12-22 VITALS — BP 132/84 | HR 93 | Ht 69.0 in | Wt 235.0 lb

## 2019-12-22 DIAGNOSIS — I4891 Unspecified atrial fibrillation: Secondary | ICD-10-CM

## 2019-12-22 DIAGNOSIS — I10 Essential (primary) hypertension: Secondary | ICD-10-CM

## 2019-12-22 MED ORDER — APIXABAN 5 MG PO TABS: 5.0000 mg | ORAL_TABLET | Freq: Two times a day (BID) | ORAL | 11 refills | Status: DC

## 2019-12-22 MED ORDER — METOPROLOL TARTRATE 25 MG PO TABS: 25.0000 mg | ORAL_TABLET | Freq: Two times a day (BID) | ORAL | 3 refills | Status: DC

## 2019-12-22 NOTE — Patient Instructions (Signed)
Medication Instructions:   START Eliquis 5 mg twice a day   START lopressor 25 mg twice a day    *If you need a refill on your cardiac medications before your next appointment, please call your pharmacy*   Lab Work: None today  If you have labs (blood work) drawn today and your tests are completely normal, you will receive your results only by: Marland Kitchen MyChart Message (if you have MyChart) OR . A paper copy in the mail If you have any lab test that is abnormal or we need to change your treatment, we will call you to review the results.   Testing/Procedures: Your physician has requested that you have an echocardiogram. Echocardiography is a painless test that uses sound waves to create images of your heart. It provides your doctor with information about the size and shape of your heart and how well your heart's chambers and valves are working. This procedure takes approximately one hour. There are no restrictions for this procedure.     Follow-Up: At [facility], you and your health needs are our priority.  As part of our continuing mission to provide you with exceptional heart care, we have created designated Provider Care Teams.  These Care Teams include your primary Cardiologist (physician) and Advanced Practice Providers (APPs -  Physician Assistants and Nurse Practitioners) who all work together to provide you with the care you need, when you need it.  We recommend signing up for the patient portal called "MyChart".  Sign up information is provided on this After Visit Summary.  MyChart is used to connect with patients for Virtual Visits (Telemedicine).  Patients are able to view lab/test results, encounter notes, upcoming appointments, etc.  Non-urgent messages can be sent to your provider as well.   To learn more about what you can do with MyChart, go to [email or web address].    Your next appointment:   3-4 week(s)  The format for your next appointment:   In  Person  Provider:   Jenkins Rouge, MD   Other Instructions   Samples Eliquis 5 mg # 28 lot [id number], exp 11/2021    Plus FREE 30 Day trail card for Eliquis       Thank you for choosing [facility] !

## 2019-12-28 ENCOUNTER — Other Ambulatory Visit: Payer: Self-pay

## 2019-12-28 ENCOUNTER — Ambulatory Visit (HOSPITAL_COMMUNITY)
Admission: RE | Admit: 2019-12-28 | Discharge: 2019-12-28 | Disposition: A | Discharge status: Home / Self Care | Payer: Medicare Other | Source: Ambulatory Visit | Attending: Cardiovascular Disease | Admitting: Cardiovascular Disease

## 2019-12-28 DIAGNOSIS — I4891 Unspecified atrial fibrillation: Secondary | ICD-10-CM

## 2019-12-28 LAB — ECHOCARDIOGRAM COMPLETE
Area-P 1/2: 4.39 cm2
S' Lateral: 3.67 cm

## 2019-12-28 NOTE — Progress Notes (Signed)
*  PRELIMINARY RESULTS* Echocardiogram 2D Echocardiogram has been performed.  Leavy Cella 12/28/2019, 10:36 AM

## 2020-01-11 NOTE — Progress Notes (Signed)
CARDIOLOGY CONSULT NOTE       Patient ID: James Dyer MRN: [id number] DOB/AGE: 1945/11/27 74 y.o.  Admit date: (Not on file) Referring Physician: [name] Primary Physician: [name], MD Primary Cardiologist: New Reason for Consultation: Afib  Active Problems:   * No active hospital problems. *   HPI:  74 y.o. first seen 12/22/19 for afib. History of HTN, HLD, DM and obesity Also with GERD, dysphagia chronic neuromuscular dx bilateral foot drop.   Seen by Dr Bronson Ing for chest pain in 2015.  Previous smoker  Has had myalgias with statin in past Myove abnormal low risk April 01 2013 no ischemia likely diaphragmatic attenuation EF 55% Echo 03/30/13 with EF 55% no significant valve disease   Lab review Hct 42 CR 1.0 Normal LFTls LDL 144 PSA normal TSH 2.6 A1c 7.2   ECG 12/22/19 showed afib . He is unaware of rhythm No chest pain , dyspnea or syncope No contraindications to anticoagulation Started on lopressor and eliquis  TTE 12/28/19 EF 50-55% LA 48 mm no significant valve disease  He is totally asymptomatic and does not want [place].   He fell and hurt his right shoulder Feels the same as when he tore his rotator cuff on left Has not had MRI or seen ortho yet   This patients CHA2DS2-VASc Score and unadjusted Ischemic Stroke Rate (% per year) is equal to 3.2 % stroke rate/year from a score of 3  Above score calculated as 1 point each if present [CHF, HTN, DM, Vascular=MI/PAD/Aortic Plaque, Age if 65-74, or Male] Above score calculated as 2 points each if present [Age > 75, or Stroke/TIA/TE]  ROS All other systems reviewed and negative except as noted above  Past Medical History:  Diagnosis Date  . Arthritis    HNP- lumbar  . Borderline diabetes   . Cancer [facility]) 2013   prostate , treated /w radiation   . Diabetes mellitus without complication ([place])   . 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 Hepatitis    "pt told  had been exposed to hepatitis when he went to donate blood"  . History of kidney stones   . History of radiation therapy 04/12/11 - 06/06/11   prostate, seminal vesicles  . History of stress test    Echocardiogram done - 03/2013  . Hypercholesterolemia    states Dr. Armandina Gemma Rx Welchol- pt. hs stopped taking because he believe it gives him  leg cramps.  . Hypertension   . Nephrolithiasis    renal calculi- , also has been told that he has a cyst on kidney - R side   . Neuromuscular disorder ([place])    bilateral foot drop- both feet     No family history on file.  Social History   Socioeconomic History  . Marital status: Married    Spouse name: Not on file  . Number of children: Not on file  . Years of education: Not on file  . Highest education level: Not on file  Occupational History  . Not on file  Tobacco Use  . Smoking status: Former Smoker    Packs/day: 1.00    Years: 5.00    Pack years: 5.00    Types: Cigarettes    Quit date: 12/18/1966    Years since quitting: 53.1  . Smokeless tobacco: Never Used  Vaping Use  . Vaping Use: Never used  Substance and Sexual Activity  . Alcohol  use: Yes    Comment: occasional beer  . Drug use: No  . Sexual activity: Never  Other Topics Concern  . Not on file  Social History Narrative  . Not on file   Social Determinants of Health   Financial Resource Strain: Not on file  Food Insecurity: Not on file  Transportation Needs: Not on file  Physical Activity: Not on file  Stress: Not on file  Social Connections: Not on file  Intimate Partner Violence: Not on file    Past Surgical History:  Procedure Laterality Date  . BACK SURGERY    . COLONOSCOPY N/A 07/22/2012   Procedure: COLONOSCOPY;  Surgeon: [name], MD;  [facility];  Service: Gastroenterology;  Laterality: N/A;  . ESOPHAGEAL DILATION N/A 04/22/2019   Procedure: ESOPHAGEAL DILATION;  Surgeon: Rogene Houston, MD;  [facility];  Service:  Endoscopy;  Laterality: N/A;  . ESOPHAGOGASTRODUODENOSCOPY N/A 04/22/2019   Procedure: ESOPHAGOGASTRODUODENOSCOPY (EGD);  Surgeon: Rogene Houston, MD;  [facility];  Service: Endoscopy;  Laterality: N/A;  145  . EXTRACORPOREAL SHOCK WAVE LITHOTRIPSY Left 03/04/2017   Procedure: LEFT EXTRACORPOREAL SHOCK WAVE LITHOTRIPSY (ESWL);  Surgeon: [name], MD;  Location: [facility];  Service: Urology;  Laterality: Left;  . FOOT SURGERY Right 1980's   calcium deposit removed  . LUMBAR LAMINECTOMY/DECOMPRESSION MICRODISCECTOMY Right 09/01/2013   Procedure: RIGHT LUMBAR FOUR-FIVE LAMINECTOMY/DECOMPRESSION MICRODISCECTOMY 1 LEVEL;  Surgeon: [name], MD;  Location: [facility];  Service: Neurosurgery;  Laterality: Right;  Right L45 microdiskectomy  . prostate biopsy  2013  . SHOULDER OPEN ROTATOR CUFF REPAIR Left 09/09/2012   Procedure: LEFT ROTATOR CUFF REPAIR SHOULDER OPEN;  Surgeon: [name], MD;  Location: [facility];  Service: Orthopedics;  Laterality: Left;      Current Outpatient Medications:  .  apixaban (ELIQUIS) 5 MG TABS tablet, Take 1 tablet (5 mg total) by mouth 2 (two) times daily., Disp: 60 tablet, Rfl: 11 .  cholecalciferol (VITAMIN D) 25 MCG (1000 UNIT) tablet, Take 1,000 Units by mouth daily with lunch., Disp: , Rfl:  .  Lidocaine HCl (PAIN RELIEF ROLL-ON EX), Apply 1 application topically daily as needed (applied to feet if needed for pain.)., Disp: , Rfl:  .  losartan (COZAAR) 100 MG tablet, Take 100 mg by mouth daily., Disp: , Rfl:  .  metoprolol tartrate (LOPRESSOR) 25 MG tablet, Take 1 tablet (25 mg total) by mouth 2 (two) times daily., Disp: 180 tablet, Rfl: 3 .  Multiple Vitamin (MULTIVITAMIN WITH MINERALS) TABS tablet, Take 1 tablet by mouth daily., Disp: , Rfl:  .  oxybutynin (DITROPAN XL) 15 MG 24 hr tablet, Take 15 mg by mouth daily., Disp: , Rfl:  .  phenylephrine (SUDAFED PE) 10 MG TABS tablet, Take 10 mg by mouth every 4 (four) hours as needed  (congestion)., Disp: , Rfl:  .  vitamin B-12 (CYANOCOBALAMIN) 1000 MCG tablet, Take 1,000 mcg by mouth every evening. , Disp: , Rfl:  .  Xylitol (XYLIMELTS) 550 MG DISK, Use as directed 1 tablet in the mouth or throat in the morning and at bedtime. , Disp: , Rfl:     Physical Exam: Blood pressure 140/90, pulse 93, resp. rate 16, height 5\' 9"  (1.753 m), weight 106.8 kg, SpO2 98 %.    Affect appropriate Healthy:  appears stated age 55: normal Neck supple with no adenopathy JVP normal no bruits no thyromegaly Lungs clear with no wheezing and good diaphragmatic motion  Heart:  S1/S2 no murmur, no rub, gallop or click PMI normal Abdomen: benighn, BS positve, no tenderness, no AAA no bruit.  No HSM or HJR Distal pulses intact with no bruits No edema Bilateral foot drop with braces  Skin warm and dry Bilateral leg splints from foot drop    Labs:   Lab Results  Component Value Date   WBC 7.5 01/18/2017   HGB 13.0 01/18/2017   HCT 40.1 01/18/2017   MCV 90.5 01/18/2017   PLT 230 01/18/2017   No results for input(s): NA, K, CL, CO2, BUN, CREATININE, CALCIUM, PROT, BILITOT, ALKPHOS, ALT, AST, GLUCOSE in the last 168 hours.  Invalid input(s): LABALBU Lab Results  Component Value Date   TROPONINI <0.30 03/14/2012   No results found for: CHOL No results found for: HDL No results found for: LDLCALC No results found for: TRIG No results found for: CHOLHDL No results found for: LDLDIRECT    Radiology: ECHOCARDIOGRAM COMPLETE  Result Date: 12/28/2019    ECHOCARDIOGRAM REPORT   Patient Name:   James Dyer Date of Exam: 12/28/2019 Medical Rec #:  [id number]       Height:       69.0 in Accession #:    [id number]      Weight:       235.0 lb Date of Birth:  [birth year]      BSA:          2.213 m Patient Age:    62 years        BP:           134/88 mmHg Patient Gender: M               HR:           84 bpm. Exam Location:  [facility] Procedure: 2D Echo Indications:    Atrial  Fibrillation 427.31 / I48.91  History:        Patient has prior history of Echocardiogram examinations, most                 recent 03/30/2013. Signs/Symptoms:Chest Pain; Risk                 Factors:Hypertension, Diabetes, Dyslipidemia and Former Smoker.  Sonographer:    Leavy Cella RDCS (AE) Referring Phys: [place]  1. Left ventricular ejection fraction, by estimation, is 50 to 55%. The left ventricle has low normal function. The left ventricle has no regional wall motion abnormalities. There is moderate left ventricular hypertrophy. Left ventricular diastolic parameters are indeterminate in the setting of atrial fibrillation.  2. Right ventricular systolic function is normal. The right ventricular size is normal.  3. The mitral valve is grossly normal. Trivial mitral valve regurgitation.  4. The aortic valve is tricuspid. Aortic valve regurgitation is not visualized.  5. The inferior vena cava is normal in size with greater than 50% respiratory variability, suggesting right atrial pressure of 3 mmHg. FINDINGS  Left Ventricle: Left ventricular ejection fraction, by estimation, is 50 to 55%. The left ventricle has low normal function. The left ventricle has no regional wall motion abnormalities. The left ventricular internal cavity size was normal in size. There is moderate left ventricular hypertrophy. Left ventricular diastolic parameters are indeterminate. Right Ventricle: The right ventricular size is normal. No increase in right ventricular wall thickness. Right ventricular systolic function is normal. Left Atrium: Left atrial size was normal in size. Right Atrium: Right atrial size was normal in size. Pericardium:  There is no evidence of pericardial effusion. Mitral Valve: The mitral valve is grossly normal. Mild mitral annular calcification. Trivial mitral valve regurgitation. Tricuspid Valve: The tricuspid valve is grossly normal. Tricuspid valve regurgitation is trivial. Aortic  Valve: The aortic valve is tricuspid. There is mild to moderate aortic valve annular calcification. Aortic valve regurgitation is not visualized. Pulmonic Valve: The pulmonic valve was grossly normal. Pulmonic valve regurgitation is trivial. Aorta: The aortic root is normal in size and structure. Venous: The inferior vena cava is normal in size with greater than 50% respiratory variability, suggesting right atrial pressure of 3 mmHg. IAS/Shunts: No atrial level shunt detected by color flow Doppler.  LEFT VENTRICLE PLAX 2D LVIDd:         4.81 cm  Diastology LVIDs:         3.67 cm  LV e' medial:    7.72 cm/s LV PW:         1.49 cm  LV E/e' medial:  10.2 LV IVS:        1.24 cm  LV e' lateral:   11.70 cm/s LVOT diam:     1.90 cm  LV E/e' lateral: 6.7 LVOT Area:     2.84 cm  RIGHT VENTRICLE RV S prime:     8.49 cm/s TAPSE (M-mode): 2.2 cm LEFT ATRIUM              Index       RIGHT ATRIUM           Index LA diam:        4.80 cm  2.17 cm/m  RA Area:     13.40 cm LA Vol (A2C):   118.0 ml 53.33 ml/m RA Volume:   30.20 ml  13.65 ml/m LA Vol (A4C):   64.4 ml  29.11 ml/m LA Biplane Vol: 88.7 ml  40.09 ml/m   AORTA Ao Root diam: 3.00 cm MITRAL VALVE MV Area (PHT): 4.39 cm    SHUNTS MV Decel Time: 173 msec    Systemic Diam: 1.90 cm MV E velocity: 78.80 cm/s Rozann Lesches MD Electronically signed by Rozann Lesches MD Signature Date/Time: 12/28/2019/12:29:26 PM    Final     EKG: SR PVC rate 74 otherwise normal 2013   ASSESSMENT AND PLAN:   1. Afib:  No clear onset. Fairly asymptomatic  Tolerating lopressor and eliquis TTE with normal EF and moderate LaE at 48 mm He defers [facility] as he is asymptomatic Continue rate control and anticoagulation. Ok to stop eliquis for 48 hours if shoulder procedure needed  2. DM:  Discussed low carb diet.  Target hemoglobin A1c is 6.5 or less.  Continue current medications. 3.HTN:  Well controlled.  Continue current medications and low sodium Dash type diet.   4. Dysphagia:  Normal  EGD April 22 2019 f/u GI  5. Otho:  Encouraged him to get MRI and see ortho as he is getting a frozen shoulder   F/U in 6 months   Signed: Jenkins Rouge 01/18/2020, 3:00 PM

## 2020-01-14 DIAGNOSIS — K219 Gastro-esophageal reflux disease without esophagitis: Secondary | ICD-10-CM | POA: Diagnosis not present

## 2020-01-14 DIAGNOSIS — E119 Type 2 diabetes mellitus without complications: Secondary | ICD-10-CM | POA: Diagnosis not present

## 2020-01-14 DIAGNOSIS — E7849 Other hyperlipidemia: Secondary | ICD-10-CM | POA: Diagnosis not present

## 2020-01-14 DIAGNOSIS — I10 Essential (primary) hypertension: Secondary | ICD-10-CM | POA: Diagnosis not present

## 2020-01-14 DIAGNOSIS — Z6834 Body mass index (BMI) 34.0-34.9, adult: Secondary | ICD-10-CM | POA: Diagnosis not present

## 2020-01-18 ENCOUNTER — Encounter: Payer: Self-pay | Admitting: Cardiovascular Disease

## 2020-01-18 ENCOUNTER — Ambulatory Visit (INDEPENDENT_AMBULATORY_CARE_PROVIDER_SITE_OTHER): Payer: Medicare Other | Admitting: Cardiovascular Disease

## 2020-01-18 ENCOUNTER — Other Ambulatory Visit: Payer: Self-pay

## 2020-01-18 VITALS — BP 140/90 | HR 93 | Resp 16 | Ht 69.0 in | Wt 235.4 lb

## 2020-01-18 DIAGNOSIS — Z23 Encounter for immunization: Secondary | ICD-10-CM | POA: Diagnosis not present

## 2020-01-18 DIAGNOSIS — I4819 Other persistent atrial fibrillation: Secondary | ICD-10-CM | POA: Diagnosis not present

## 2020-01-18 NOTE — Patient Instructions (Signed)
Medication Instructions:  Your physician recommends that you continue on your current medications as directed. Please refer to the Current Medication list given to you today.  *If you need a refill on your cardiac medications before your next appointment, please call your pharmacy*   Lab Work: NONE  If you have labs (blood work) drawn today and your tests are completely normal, you will receive your results only by: MyChart Message (if you have MyChart) OR A paper copy in the mail If you have any lab test that is abnormal or we need to change your treatment, we will call you to review the results.   Testing/Procedures: NONE    Follow-Up: At [facility], you and your health needs are our priority.  As part of our continuing mission to provide you with exceptional heart care, we have created designated Provider Care Teams.  These Care Teams include your primary Cardiologist (physician) and Advanced Practice Providers (APPs -  Physician Assistants and Nurse Practitioners) who all work together to provide you with the care you need, when you need it.  We recommend signing up for the patient portal called "MyChart".  Sign up information is provided on this After Visit Summary.  MyChart is used to connect with patients for Virtual Visits (Telemedicine).  Patients are able to view lab/test results, encounter notes, upcoming appointments, etc.  Non-urgent messages can be sent to your provider as well.   To learn more about what you can do with MyChart, go to [email or web address].    Your next appointment:   6 month(s)  The format for your next appointment:   In Person  Provider:   Peter Nishan, MD   Other Instructions Thank you for choosing [facility]!    

## 2020-02-03 DIAGNOSIS — M19111 Post-traumatic osteoarthritis, right shoulder: Secondary | ICD-10-CM | POA: Diagnosis not present

## 2020-02-03 DIAGNOSIS — M25511 Pain in right shoulder: Secondary | ICD-10-CM | POA: Diagnosis not present

## 2020-03-02 DIAGNOSIS — M19011 Primary osteoarthritis, right shoulder: Secondary | ICD-10-CM | POA: Diagnosis not present

## 2020-05-04 DIAGNOSIS — Z1389 Encounter for screening for other disorder: Secondary | ICD-10-CM | POA: Diagnosis not present

## 2020-05-04 DIAGNOSIS — Z6830 Body mass index (BMI) 30.0-30.9, adult: Secondary | ICD-10-CM | POA: Diagnosis not present

## 2020-05-04 DIAGNOSIS — Z1331 Encounter for screening for depression: Secondary | ICD-10-CM | POA: Diagnosis not present

## 2020-05-04 DIAGNOSIS — R001 Bradycardia, unspecified: Secondary | ICD-10-CM | POA: Diagnosis not present

## 2020-05-04 DIAGNOSIS — I4891 Unspecified atrial fibrillation: Secondary | ICD-10-CM | POA: Diagnosis not present

## 2020-05-04 DIAGNOSIS — R42 Dizziness and giddiness: Secondary | ICD-10-CM | POA: Diagnosis not present

## 2020-05-04 DIAGNOSIS — E538 Deficiency of other specified B group vitamins: Secondary | ICD-10-CM | POA: Diagnosis not present

## 2020-05-04 DIAGNOSIS — E118 Type 2 diabetes mellitus with unspecified complications: Secondary | ICD-10-CM | POA: Diagnosis not present

## 2020-05-05 NOTE — Progress Notes (Addendum)
CARDIOLOGY CONSULT NOTE       Patient ID: James Dyer MRN: [id number] DOB/AGE: 75-Sep-1947 75 y.o.  Admit date: (Not on file) Referring Physician: [name] Primary Physician: [name], MD Primary Cardiologist: New Reason for Consultation: Afib  Active Problems:   * No active hospital problems. *   HPI: 75 y.o. with history of PAF, HTN, HLD,DM-2, GERD, Dysphagia and chronic neuromuscular Sx bilateral foot drop. No known CAD non ischemic myovue 2015. Found to be in asymptomatic afib 12/22/19 Started on lopressor and eliquis TTE 12/28/19 with EF 50-55% LA 48 mm no significant valve disease Patient deferred [facility]. Has chronic bilateral shoulder injuries with  Previous surgery on left Recent cortisone shot on right   CHADVASC 3   Seen by primary with somatic complaints Ringing in ears, dizzy, tinnitus Rx meclizine, B12 shot And antibiotic in office. Feeling some better Lab work done and told it was good  He has not had any dyspnea, palpitations, chest pain or syncope  Labs 05/04/20 reviewed Hct 45.7 A1c 6.7        ROS All other systems reviewed and negative except as noted above  Past Medical History:  Diagnosis Date  . Arthritis    HNP- lumbar  . Borderline diabetes   . Cancer [facility]) 2013   prostate , treated /w radiation   . Diabetes mellitus without complication ([place])   . 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 Hepatitis    "pt told had been exposed to hepatitis when he went to donate blood"  . History of kidney stones   . History of radiation therapy 04/12/11 - 06/06/11   prostate, seminal vesicles  . History of stress test    Echocardiogram done - 03/2013  . Hypercholesterolemia    states Dr. Armandina Gemma Rx Welchol- pt. hs stopped taking because he believe it gives him  leg cramps.  . Hypertension   . Nephrolithiasis    renal calculi- , also has been told that he has a cyst on kidney - R side   . Neuromuscular  disorder ([place])    bilateral foot drop- both feet     No family history on file.  Social History   Socioeconomic History  . Marital status: Married    Spouse name: Not on file  . Number of children: Not on file  . Years of education: Not on file  . Highest education level: Not on file  Occupational History  . Not on file  Tobacco Use  . Smoking status: Former Smoker    Packs/day: 1.00    Years: 5.00    Pack years: 5.00    Types: Cigarettes    Quit date: 12/18/1966    Years since quitting: 53.4  . Smokeless tobacco: Never Used  Vaping Use  . Vaping Use: Never used  Substance and Sexual Activity  . Alcohol use: Yes    Comment: occasional beer  . Drug use: No  . Sexual activity: Never  Other Topics Concern  . Not on file  Social History Narrative  . Not on file   Social Determinants of Health   Financial Resource Strain: Not on file  Food Insecurity: Not on file  Transportation Needs: Not on file  Physical Activity: Not on file  Stress: Not on file  Social Connections: Not on file  Intimate Partner Violence: Not on file    Past Surgical History:  Procedure Laterality Date  .  BACK SURGERY    . COLONOSCOPY N/A 07/22/2012   Procedure: COLONOSCOPY;  Surgeon: [name], MD;  [facility];  Service: Gastroenterology;  Laterality: N/A;  . ESOPHAGEAL DILATION N/A 04/22/2019   Procedure: ESOPHAGEAL DILATION;  Surgeon: Rogene Houston, MD;  [facility];  Service: Endoscopy;  Laterality: N/A;  . ESOPHAGOGASTRODUODENOSCOPY N/A 04/22/2019   Procedure: ESOPHAGOGASTRODUODENOSCOPY (EGD);  Surgeon: Rogene Houston, MD;  [facility];  Service: Endoscopy;  Laterality: N/A;  145  . EXTRACORPOREAL SHOCK WAVE LITHOTRIPSY Left 03/04/2017   Procedure: LEFT EXTRACORPOREAL SHOCK WAVE LITHOTRIPSY (ESWL);  Surgeon: [name], MD;  [facility];  Service: Urology;  Laterality: Left;  . FOOT SURGERY Right 1980's   calcium deposit removed   . LUMBAR LAMINECTOMY/DECOMPRESSION MICRODISCECTOMY Right 09/01/2013   Procedure: RIGHT LUMBAR FOUR-FIVE LAMINECTOMY/DECOMPRESSION MICRODISCECTOMY 1 LEVEL;  Surgeon: [name], MD;  Location: [facility];  Service: Neurosurgery;  Laterality: Right;  Right L45 microdiskectomy  . prostate biopsy  2013  . SHOULDER OPEN ROTATOR CUFF REPAIR Left 09/09/2012   Procedure: LEFT ROTATOR CUFF REPAIR SHOULDER OPEN;  Surgeon: [name], MD;  [facility];  Service: Orthopedics;  Laterality: Left;      Current Outpatient Medications:  .  apixaban (ELIQUIS) 5 MG TABS tablet, Take 1 tablet (5 mg total) by mouth 2 (two) times daily., Disp: 60 tablet, Rfl: 11 .  cholecalciferol (VITAMIN D) 25 MCG (1000 UNIT) tablet, Take 1,000 Units by mouth daily with lunch., Disp: , Rfl:  .  Lidocaine HCl (PAIN RELIEF ROLL-ON EX), Apply 1 application topically daily as needed (applied to feet if needed for pain.)., Disp: , Rfl:  .  losartan (COZAAR) 100 MG tablet, Take 100 mg by mouth daily., Disp: , Rfl:  .  metoprolol tartrate (LOPRESSOR) 25 MG tablet, Take 1 tablet (25 mg total) by mouth 2 (two) times daily., Disp: 180 tablet, Rfl: 3 .  Multiple Vitamin (MULTIVITAMIN WITH MINERALS) TABS tablet, Take 1 tablet by mouth daily., Disp: , Rfl:  .  oxybutynin (DITROPAN XL) 15 MG 24 hr tablet, Take 15 mg by mouth daily., Disp: , Rfl:  .  phenylephrine (SUDAFED PE) 10 MG TABS tablet, Take 10 mg by mouth every 4 (four) hours as needed (congestion)., Disp: , Rfl:  .  vitamin B-12 (CYANOCOBALAMIN) 1000 MCG tablet, Take 1,000 mcg by mouth every evening. , Disp: , Rfl:  .  Xylitol (XYLIMELTS) 550 MG DISK, Use as directed 1 tablet in the mouth or throat in the morning and at bedtime. , Disp: , Rfl:     Physical Exam: There were no vitals taken for this visit.    Affect appropriate Healthy:  appears stated age 56: normal Neck supple with no adenopathy JVP normal no bruits no thyromegaly Lungs clear with no wheezing  and good diaphragmatic motion Heart:  S1/S2 no murmur, no rub, gallop or click PMI normal Abdomen: benighn, BS positve, no tenderness, no AAA no bruit.  No HSM or HJR Distal pulses intact with no bruits No edema Bilateral foot drop with braces  Skin warm and dry Bilateral leg splints from foot drop    Labs:   Lab Results  Component Value Date   WBC 7.5 01/18/2017   HGB 13.0 01/18/2017   HCT 40.1 01/18/2017   MCV 90.5 01/18/2017   PLT 230 01/18/2017   No results for input(s): NA, K, CL, CO2, BUN, CREATININE, CALCIUM, PROT, BILITOT, ALKPHOS, ALT, AST, GLUCOSE in the  last 168 hours.  Invalid input(s): LABALBU Lab Results  Component Value Date   TROPONINI <0.30 03/14/2012   No results found for: CHOL No results found for: HDL No results found for: LDLCALC No results found for: TRIG No results found for: CHOLHDL No results found for: LDLDIRECT    Radiology: No results found.  EKG: SR PVC rate 74 otherwise normal 2013   ASSESSMENT AND PLAN:   1. Afib:  Asymptomatic  Continue rate control and anticoagulation.   2. DM:  Discussed low carb diet.  Target hemoglobin A1c is 6.5 or less.  Continue current medications. 3.HTN:  Well controlled.  Continue current medications and low sodium Dash type diet.   4. Dysphagia:  Normal EGD April 22 2019 f/u GI  5. Otho:  Encouraged him to get MRI and see ortho as he is getting a frozen shoulder  6. Dizzy/Malaise:  Not clear that any of his symptoms related to his heart On blood thinner Hct Was normal 05/04/20   F/U in 6 months   Signed: Jenkins Rouge 05/05/2020, 9:12 AM

## 2020-05-11 ENCOUNTER — Encounter: Payer: Self-pay | Admitting: Cardiovascular Disease

## 2020-05-11 ENCOUNTER — Encounter: Payer: Self-pay | Admitting: *Deleted

## 2020-05-11 ENCOUNTER — Other Ambulatory Visit: Payer: Self-pay

## 2020-05-11 ENCOUNTER — Ambulatory Visit (INDEPENDENT_AMBULATORY_CARE_PROVIDER_SITE_OTHER): Payer: Medicare Other | Admitting: Cardiovascular Disease

## 2020-05-11 VITALS — BP 134/80 | HR 78 | Ht 69.0 in | Wt 243.0 lb

## 2020-05-11 DIAGNOSIS — R42 Dizziness and giddiness: Secondary | ICD-10-CM | POA: Diagnosis not present

## 2020-05-11 DIAGNOSIS — Z7901 Long term (current) use of anticoagulants: Secondary | ICD-10-CM | POA: Diagnosis not present

## 2020-05-11 DIAGNOSIS — I482 Chronic atrial fibrillation, unspecified: Secondary | ICD-10-CM

## 2020-05-11 NOTE — Patient Instructions (Signed)
Medication Instructions:  Your physician recommends that you continue on your current medications as directed. Please refer to the Current Medication list given to you today.  *If you need a refill on your cardiac medications before your next appointment, please call your pharmacy*   Lab Work: NONE  If you have labs (blood work) drawn today and your tests are completely normal, you will receive your results only by: MyChart Message (if you have MyChart) OR A paper copy in the mail If you have any lab test that is abnormal or we need to change your treatment, we will call you to review the results.   Testing/Procedures: NONE    Follow-Up: At [facility], you and your health needs are our priority.  As part of our continuing mission to provide you with exceptional heart care, we have created designated Provider Care Teams.  These Care Teams include your primary Cardiologist (physician) and Advanced Practice Providers (APPs -  Physician Assistants and Nurse Practitioners) who all work together to provide you with the care you need, when you need it.  We recommend signing up for the patient portal called "MyChart".  Sign up information is provided on this After Visit Summary.  MyChart is used to connect with patients for Virtual Visits (Telemedicine).  Patients are able to view lab/test results, encounter notes, upcoming appointments, etc.  Non-urgent messages can be sent to your provider as well.   To learn more about what you can do with MyChart, go to [email or web address].    Your next appointment:   6 month(s)  The format for your next appointment:   In Person  Provider:   Peter Nishan, MD   Other Instructions Thank you for choosing [facility]!    

## 2020-07-06 DIAGNOSIS — L918 Other hypertrophic disorders of the skin: Secondary | ICD-10-CM | POA: Diagnosis not present

## 2020-07-06 DIAGNOSIS — D225 Melanocytic nevi of trunk: Secondary | ICD-10-CM | POA: Diagnosis not present

## 2020-07-20 ENCOUNTER — Ambulatory Visit: Payer: Medicare Other | Admitting: Cardiovascular Disease

## 2020-08-25 DIAGNOSIS — E119 Type 2 diabetes mellitus without complications: Secondary | ICD-10-CM | POA: Diagnosis not present

## 2020-08-25 DIAGNOSIS — H25813 Combined forms of age-related cataract, bilateral: Secondary | ICD-10-CM | POA: Diagnosis not present

## 2020-08-25 DIAGNOSIS — H01001 Unspecified blepharitis right upper eyelid: Secondary | ICD-10-CM | POA: Diagnosis not present

## 2020-08-25 DIAGNOSIS — H01002 Unspecified blepharitis right lower eyelid: Secondary | ICD-10-CM | POA: Diagnosis not present

## 2020-09-13 ENCOUNTER — Other Ambulatory Visit: Payer: Self-pay

## 2020-09-13 ENCOUNTER — Ambulatory Visit
Admission: EM | Admit: 2020-09-13 | Discharge: 2020-09-13 | Disposition: A | Discharge status: Home / Self Care | Payer: Medicare Other | Attending: Family Medicine | Admitting: Family Medicine

## 2020-09-13 ENCOUNTER — Encounter: Payer: Self-pay | Admitting: Emergency Medicine

## 2020-09-13 DIAGNOSIS — B029 Zoster without complications: Secondary | ICD-10-CM

## 2020-09-13 DIAGNOSIS — L239 Allergic contact dermatitis, unspecified cause: Secondary | ICD-10-CM

## 2020-09-13 MED ORDER — VALACYCLOVIR HCL 1 G PO TABS: 1000.0000 mg | ORAL_TABLET | Freq: Two times a day (BID) | ORAL | 0 refills | Status: AC

## 2020-09-13 MED ORDER — TRIAMCINOLONE ACETONIDE 0.1 % EX CREA: 1.0000 "application " | TOPICAL_CREAM | Freq: Two times a day (BID) | CUTANEOUS | 0 refills | Status: DC

## 2020-09-13 NOTE — ED Triage Notes (Signed)
Red itchy rash on all over, larger patch on left side of back since Thursday

## 2020-09-28 DIAGNOSIS — R001 Bradycardia, unspecified: Secondary | ICD-10-CM | POA: Diagnosis not present

## 2020-09-28 DIAGNOSIS — E782 Mixed hyperlipidemia: Secondary | ICD-10-CM | POA: Diagnosis not present

## 2020-09-28 DIAGNOSIS — I10 Essential (primary) hypertension: Secondary | ICD-10-CM | POA: Diagnosis not present

## 2020-09-28 DIAGNOSIS — Z0001 Encounter for general adult medical examination with abnormal findings: Secondary | ICD-10-CM | POA: Diagnosis not present

## 2020-09-28 DIAGNOSIS — E118 Type 2 diabetes mellitus with unspecified complications: Secondary | ICD-10-CM | POA: Diagnosis not present

## 2020-09-28 DIAGNOSIS — N400 Enlarged prostate without lower urinary tract symptoms: Secondary | ICD-10-CM | POA: Diagnosis not present

## 2020-09-28 DIAGNOSIS — Z1389 Encounter for screening for other disorder: Secondary | ICD-10-CM | POA: Diagnosis not present

## 2020-09-28 DIAGNOSIS — I4891 Unspecified atrial fibrillation: Secondary | ICD-10-CM | POA: Diagnosis not present

## 2020-09-28 DIAGNOSIS — Z1331 Encounter for screening for depression: Secondary | ICD-10-CM | POA: Diagnosis not present

## 2020-09-28 DIAGNOSIS — E538 Deficiency of other specified B group vitamins: Secondary | ICD-10-CM | POA: Diagnosis not present

## 2020-09-28 DIAGNOSIS — M21379 Foot drop, unspecified foot: Secondary | ICD-10-CM | POA: Diagnosis not present

## 2020-09-28 DIAGNOSIS — E6609 Other obesity due to excess calories: Secondary | ICD-10-CM | POA: Diagnosis not present

## 2020-09-28 DIAGNOSIS — Z6836 Body mass index (BMI) 36.0-36.9, adult: Secondary | ICD-10-CM | POA: Diagnosis not present

## 2020-09-28 DIAGNOSIS — Z125 Encounter for screening for malignant neoplasm of prostate: Secondary | ICD-10-CM | POA: Diagnosis not present

## 2020-12-05 DIAGNOSIS — Z20822 Contact with and (suspected) exposure to covid-19: Secondary | ICD-10-CM | POA: Diagnosis not present

## 2020-12-15 DIAGNOSIS — Z23 Encounter for immunization: Secondary | ICD-10-CM | POA: Diagnosis not present

## 2020-12-26 ENCOUNTER — Other Ambulatory Visit: Payer: Self-pay | Admitting: Cardiovascular Disease

## 2021-01-02 NOTE — Progress Notes (Signed)
CARDIOLOGY CONSULT NOTE       Patient ID: James Dyer MRN: [id number] DOB/AGE: February 21, 1945 75 y.o.  Admit date: (Not on file) Referring Physician: [name] Primary Physician: [name], MD Primary Cardiologist: New Reason for Consultation: Afib  Active Problems:   * No active hospital problems. *   HPI: 75 y.o. with history of PAF, HTN, HLD,DM-2, GERD, Dysphagia and chronic neuromuscular Sx bilateral foot drop. No known CAD non ischemic myovue 2015. Found to be in asymptomatic afib 12/22/19 Started on lopressor and eliquis TTE 12/28/19 with EF 50-55% LA 48 mm no significant valve disease Patient deferred [facility]. Has chronic bilateral shoulder injuries with  Previous surgery on left Recent cortisone shot on right   CHADVASC 3   He has not had any dyspnea, palpitations, chest pain or syncope  Long discussion with him about utility of one effort to restore NSR Higher risk for CHF, CE, MR over time. Risks including stroke, PPM, intubation discussed willing to proceed  He has worsening incontinence [place] [facility] Ditropan XL not working Needs f/u visit     ROS All other systems reviewed and negative except as noted above  Past Medical History:  Diagnosis Date   Arthritis    HNP- lumbar   Borderline diabetes    Cancer ([place]) 2013   prostate , treated /w radiation    Diabetes mellitus without complication ([place])    Foot drop, bilateral since birthpt wears braces in shoes   pt must have braces to walk   Hepatitis    "pt told had been exposed to hepatitis when he went to donate blood"   History of kidney stones    History of radiation therapy 04/12/11 - 06/06/11   prostate, seminal vesicles   History of stress test    Echocardiogram done - 03/2013   Hypercholesterolemia    states Dr. Armandina Gemma Rx Welchol- pt. hs stopped taking because he believe it gives him  leg cramps.   Hypertension    Nephrolithiasis    renal calculi- , also has been told that he has a  cyst on kidney - R side    Neuromuscular disorder ([place])    bilateral foot drop- both feet     History reviewed. No pertinent family history.  Social History   Socioeconomic History   Marital status: Married    Spouse name: Not on file   Number of children: Not on file   Years of education: Not on file   Highest education level: Not on file  Occupational History   Not on file  Tobacco Use   Smoking status: Former    Packs/day: 1.00    Years: 5.00    Pack years: 5.00    Types: Cigarettes    Quit date: 12/18/1966    Years since quitting: 54.0   Smokeless tobacco: Never  Vaping Use   Vaping Use: Never used  Substance and Sexual Activity   Alcohol use: Yes    Comment: occasional beer   Drug use: No   Sexual activity: Never  Other Topics Concern   Not on file  Social History Narrative   Not on file   Social Determinants of Health   Financial Resource Strain: Not on file  Food Insecurity: Not on file  Transportation Needs: Not on file  Physical Activity: Not on file  Stress: Not on file  Social Connections: Not on file  Intimate Partner Violence: Not on file    Past Surgical History:  Procedure Laterality  Date   BACK SURGERY     COLONOSCOPY N/A 07/22/2012   Procedure: COLONOSCOPY;  Surgeon: [name], MD;  [facility];  Service: Gastroenterology;  Laterality: N/A;   ESOPHAGEAL DILATION N/A 04/22/2019   Procedure: ESOPHAGEAL DILATION;  Surgeon: Rogene Houston, MD;  [facility];  Service: Endoscopy;  Laterality: N/A;   ESOPHAGOGASTRODUODENOSCOPY N/A 04/22/2019   Procedure: ESOPHAGOGASTRODUODENOSCOPY (EGD);  Surgeon: Rogene Houston, MD;  [facility];  Service: Endoscopy;  Laterality: N/A;  145   EXTRACORPOREAL SHOCK WAVE LITHOTRIPSY Left 03/04/2017   Procedure: LEFT EXTRACORPOREAL SHOCK WAVE LITHOTRIPSY (ESWL);  Surgeon: [name], MD;  [facility];  Service: Urology;  Laterality: Left;   FOOT SURGERY Right  1980's   calcium deposit removed   LUMBAR LAMINECTOMY/DECOMPRESSION MICRODISCECTOMY Right 09/01/2013   Procedure: RIGHT LUMBAR FOUR-FIVE LAMINECTOMY/DECOMPRESSION MICRODISCECTOMY 1 LEVEL;  Surgeon: [name], MD;  Location: [place] [facility];  Service: Neurosurgery;  Laterality: Right;  Right L45 microdiskectomy   prostate biopsy  2013   SHOULDER OPEN ROTATOR CUFF REPAIR Left 09/09/2012   Procedure: LEFT ROTATOR CUFF REPAIR SHOULDER OPEN;  Surgeon: [name], MD;  [facility];  Service: Orthopedics;  Laterality: Left;      Current Outpatient Medications:    apixaban (ELIQUIS) 5 MG TABS tablet, Take 1 tablet (5 mg total) by mouth 2 (two) times daily., Disp: 60 tablet, Rfl: 11   cholecalciferol (VITAMIN D) 25 MCG (1000 UNIT) tablet, Take 1,000 Units by mouth daily with lunch., Disp: , Rfl:    Lidocaine HCl (PAIN RELIEF ROLL-ON EX), Apply 1 application topically daily as needed (applied to feet if needed for pain.)., Disp: , Rfl:    losartan (COZAAR) 100 MG tablet, Take 100 mg by mouth daily., Disp: , Rfl:    meclizine (ANTIVERT) 25 MG tablet, SMARTSIG:2 By Mouth Twice Daily PRN, Disp: , Rfl:    metoprolol tartrate (LOPRESSOR) 25 MG tablet, TAKE 1 TABLET(25 MG) BY MOUTH TWICE DAILY, Disp: 180 tablet, Rfl: 1   Multiple Vitamin (MULTIVITAMIN WITH MINERALS) TABS tablet, Take 1 tablet by mouth daily., Disp: , Rfl:    oxybutynin (DITROPAN XL) 15 MG 24 hr tablet, Take 15 mg by mouth daily., Disp: , Rfl:    phenylephrine (SUDAFED PE) 10 MG TABS tablet, Take 10 mg by mouth every 4 (four) hours as needed (congestion)., Disp: , Rfl:    triamcinolone cream (KENALOG) 0.1 %, Apply 1 application topically 2 (two) times daily., Disp: 454 g, Rfl: 0   vitamin B-12 (CYANOCOBALAMIN) 1000 MCG tablet, Take 1,000 mcg by mouth every evening. , Disp: , Rfl:     Physical Exam: Blood pressure (!) 148/92, pulse 75, height 5\' 9"  (1.753 m), weight 253 lb 9.6 oz (115 kg), SpO2 97 %.    Affect appropriate Healthy:   appears stated age 28: normal Neck supple with no adenopathy JVP normal no bruits no thyromegaly Lungs clear with no wheezing and good diaphragmatic motion Heart:  S1/S2 no murmur, no rub, gallop or click PMI normal Abdomen: benighn, BS positve, no tenderness, no AAA no bruit.  No HSM or HJR Distal pulses intact with no bruits No edema Bilateral foot drop with braces  Skin warm and dry Bilateral leg splints from foot drop    Labs:   Lab Results  Component Value Date   WBC 7.5 01/18/2017   HGB 13.0 01/18/2017   HCT 40.1 01/18/2017   MCV 90.5 01/18/2017   PLT 230 01/18/2017  No results for input(s): NA, K, CL, CO2, BUN, CREATININE, CALCIUM, PROT, BILITOT, ALKPHOS, ALT, AST, GLUCOSE in the last 168 hours.  Invalid input(s): LABALBU Lab Results  Component Value Date   TROPONINI <0.30 03/14/2012   No results found for: CHOL No results found for: HDL No results found for: LDLCALC No results found for: TRIG No results found for: CHOLHDL No results found for: LDLDIRECT    Radiology: No results found.  EKG: SR PVC rate 74 otherwise normal 2013   ASSESSMENT AND PLAN:   1. Afib:  Continue eliquis no missed doses and lopressor [place] to be scheduled at [facility] or on my reader B day. See above risks discussed    2. DM:  Discussed low carb diet.  Target hemoglobin A1c is 6.5 or less.  Continue current medications. 3.HTN:  Well controlled.  Continue current medications and low sodium Dash type diet.   4. Dysphagia:  Normal EGD April 22 2019 f/u GI  5. Otho:  Encouraged him to get MRI and see ortho as he is getting a frozen shoulder  6. Dizzy/Malaise:  Not clear that any of his symptoms related to his heart On blood thinner Hct Was normal 05/04/20  7. Urology:  f/u with Dr Eulogio Ditch Incontinence worse Ditropan not effective Told him he would not be able to d/c eliquis for at least 4 weeks post cardioversion for any urologic procedure He will make f/u appointment with them   F/U2-3  weeks post conversion attempt   Signed: Jenkins Rouge 01/04/2021, 10:44 AM

## 2021-01-04 ENCOUNTER — Other Ambulatory Visit: Payer: Self-pay | Admitting: Cardiovascular Disease

## 2021-01-04 ENCOUNTER — Other Ambulatory Visit: Payer: Self-pay

## 2021-01-04 ENCOUNTER — Encounter: Payer: Self-pay | Admitting: *Deleted

## 2021-01-04 ENCOUNTER — Encounter: Payer: Self-pay | Admitting: Cardiovascular Disease

## 2021-01-04 ENCOUNTER — Ambulatory Visit (INDEPENDENT_AMBULATORY_CARE_PROVIDER_SITE_OTHER): Payer: Medicare Other | Admitting: Cardiovascular Disease

## 2021-01-04 ENCOUNTER — Telehealth: Payer: Self-pay | Admitting: Cardiovascular Disease

## 2021-01-04 VITALS — BP 148/92 | HR 75 | Ht 69.0 in | Wt 253.6 lb

## 2021-01-04 DIAGNOSIS — I482 Chronic atrial fibrillation, unspecified: Secondary | ICD-10-CM | POA: Diagnosis not present

## 2021-01-04 DIAGNOSIS — N393 Stress incontinence (female) (male): Secondary | ICD-10-CM | POA: Diagnosis not present

## 2021-01-04 DIAGNOSIS — Z7901 Long term (current) use of anticoagulants: Secondary | ICD-10-CM

## 2021-01-04 DIAGNOSIS — I480 Paroxysmal atrial fibrillation: Secondary | ICD-10-CM

## 2021-01-04 DIAGNOSIS — I4819 Other persistent atrial fibrillation: Secondary | ICD-10-CM | POA: Diagnosis not present

## 2021-01-04 DIAGNOSIS — I10 Essential (primary) hypertension: Secondary | ICD-10-CM | POA: Diagnosis not present

## 2021-01-04 NOTE — Telephone Encounter (Signed)
Patient would like to have his procedure currently scheduled for 02/03/21 moved out to after 02/11/21. His son would like to be present but will be out of town until after 02/11/21

## 2021-01-04 NOTE — Patient Instructions (Signed)
Medication Instructions:  Your physician recommends that you continue on your current medications as directed. Please refer to the Current Medication list given to you today.  *If you need a refill on your cardiac medications before your next appointment, please call your pharmacy*   Lab Work: Your physician recommends that you return for lab work during Pre op   If you have labs (blood work) drawn today and your tests are completely normal, you will receive your results only by: [place] (if you have MyChart) OR A paper copy in the mail If you have any lab test that is abnormal or we need to change your treatment, we will call you to review the results.   Testing/Procedures: Your physician has recommended that you have a Cardioversion (DCCV). Electrical Cardioversion uses a jolt of electricity to your heart either through paddles or wired patches attached to your chest. This is a controlled, usually prescheduled, procedure. Defibrillation is done under light anesthesia in the hospital, and you usually go home the day of the procedure. This is done to get your heart back into a normal rhythm. You are not awake for the procedure. Please see the instruction sheet given to you today.    Follow-Up: At [facility], you and your health needs are our priority.  As part of our continuing mission to provide you with exceptional heart care, we have created designated Provider Care Teams.  These Care Teams include your primary Cardiologist (physician) and Advanced Practice Providers (APPs -  Physician Assistants and Nurse Practitioners) who all work together to provide you with the care you need, when you need it.  We recommend signing up for the patient portal called "MyChart".  Sign up information is provided on this After Visit Summary.  MyChart is used to connect with patients for Virtual Visits (Telemedicine).  Patients are able to view lab/test results, encounter notes, upcoming  appointments, etc.  Non-urgent messages can be sent to your provider as well.   To learn more about what you can do with MyChart, go to [email or web address].    Your next appointment:    After Cardioversion   The format for your next appointment:   In Person  Provider:   Jenkins Rouge, MD    Other Instructions Thank you for choosing [facility]!

## 2021-01-06 NOTE — Telephone Encounter (Signed)
Pt cardioversion has been moved to 02/16/2020, first case, pt needs to arrive at 7:30 am. Left a message on pt's phone detailing the changes. Will call again.

## 2021-01-09 NOTE — Telephone Encounter (Signed)
Pt cardioversion moved to 1/31 with Dr. Johnsie Cancel. Pt notified and verbalized understanding. Pt agreeable to still have lab work completed.

## 2021-01-10 ENCOUNTER — Other Ambulatory Visit: Payer: Self-pay | Admitting: Cardiovascular Disease

## 2021-01-10 NOTE — Telephone Encounter (Signed)
Prescription refill request for Eliquis received. Indication: Atrial fib Last office visit: 01/04/21  Edmonia James MD Scr: 1.04 on 09/28/20 Age: 75 Weight: 115kg  Based on above findings Eliquis 5mg  twice daily is the appropriate dose.  Refill approved.

## 2021-01-29 DIAGNOSIS — Z9181 History of falling: Secondary | ICD-10-CM | POA: Insufficient documentation

## 2021-01-29 DIAGNOSIS — Z87891 Personal history of nicotine dependence: Secondary | ICD-10-CM | POA: Insufficient documentation

## 2021-01-29 DIAGNOSIS — I7121 Aneurysm of the ascending aorta, without rupture: Secondary | ICD-10-CM | POA: Insufficient documentation

## 2021-01-29 DIAGNOSIS — I10 Essential (primary) hypertension: Secondary | ICD-10-CM | POA: Insufficient documentation

## 2021-01-29 DIAGNOSIS — N200 Calculus of kidney: Secondary | ICD-10-CM | POA: Insufficient documentation

## 2021-01-29 DIAGNOSIS — N3281 Overactive bladder: Secondary | ICD-10-CM | POA: Insufficient documentation

## 2021-01-29 DIAGNOSIS — E7800 Pure hypercholesterolemia, unspecified: Secondary | ICD-10-CM | POA: Insufficient documentation

## 2021-01-29 DIAGNOSIS — G709 Myoneural disorder, unspecified: Secondary | ICD-10-CM | POA: Insufficient documentation

## 2021-01-31 ENCOUNTER — Other Ambulatory Visit: Payer: Self-pay

## 2021-01-31 ENCOUNTER — Encounter: Payer: Self-pay | Admitting: Urology

## 2021-01-31 ENCOUNTER — Ambulatory Visit (INDEPENDENT_AMBULATORY_CARE_PROVIDER_SITE_OTHER): Payer: Medicare Other | Admitting: Urology

## 2021-01-31 VITALS — BP 151/89 | HR 54

## 2021-01-31 DIAGNOSIS — C61 Malignant neoplasm of prostate: Secondary | ICD-10-CM

## 2021-01-31 DIAGNOSIS — R350 Frequency of micturition: Secondary | ICD-10-CM

## 2021-01-31 DIAGNOSIS — Z8546 Personal history of malignant neoplasm of prostate: Secondary | ICD-10-CM

## 2021-01-31 DIAGNOSIS — R3915 Urgency of urination: Secondary | ICD-10-CM

## 2021-01-31 DIAGNOSIS — R3129 Other microscopic hematuria: Secondary | ICD-10-CM | POA: Diagnosis not present

## 2021-01-31 DIAGNOSIS — N200 Calculus of kidney: Secondary | ICD-10-CM

## 2021-01-31 LAB — URINALYSIS, ROUTINE W REFLEX MICROSCOPIC
Bilirubin, UA: NEGATIVE
Glucose, UA: NEGATIVE
Leukocytes,UA: NEGATIVE
Nitrite, UA: NEGATIVE
Specific Gravity, UA: 1.025 (ref 1.005–1.030)
Urobilinogen, Ur: 0.2 mg/dL (ref 0.2–1.0)
pH, UA: 5.5 (ref 5.0–7.5)

## 2021-01-31 LAB — MICROSCOPIC EXAMINATION
Bacteria, UA: NONE SEEN
Renal Epithel, UA: NONE SEEN /hpf
WBC, UA: NONE SEEN /hpf (ref 0–5)

## 2021-01-31 LAB — BLADDER SCAN AMB NON-IMAGING

## 2021-01-31 NOTE — Progress Notes (Signed)
History of Present Illness: Here for follow-up of several issues:   1.  History of prostate cancer.  Initial ultrasound and biopsy on 11/23/2010.  At that time prostate size 38 mL, PSA density 0.10.  1/12 cores revealed GS 3+4 pattern.  He completed IMRT on 06/06/2011.  Last follow-up here in early 2020.  At that time PSA was 0.10.  2.  History of urolithiasis.  Had lithotripsy for a left upper ureteral stone on 03/04/2017.  No symptoms of stone since then  3.  Overactive bladder symptoms.  He is on oxybutynin XL 15 mg a day.  Despite that, he still has significant lower urinary tract symptoms-frequency, urgency, urgency incontinence.  Past Medical History:  Diagnosis Date   Arthritis    HNP- lumbar   Borderline diabetes    Cancer ([place]) 2013   prostate , treated /w radiation    Diabetes mellitus without complication ([place])    Foot drop, bilateral since birthpt wears braces in shoes   pt must have braces to walk   Hepatitis    "pt told had been exposed to hepatitis when he went to donate blood"   History of kidney stones    History of radiation therapy 04/12/11 - 06/06/11   prostate, seminal vesicles   History of stress test    Echocardiogram done - 03/2013   Hypercholesterolemia    states Dr. Armandina Gemma Rx Welchol- pt. hs stopped taking because he believe it gives him  leg cramps.   Hypertension    Nephrolithiasis    renal calculi- , also has been told that he has a cyst on kidney - R side    Neuromuscular disorder ([place])    bilateral foot drop- both feet     Past Surgical History:  Procedure Laterality Date   BACK SURGERY     COLONOSCOPY N/A 07/22/2012   Procedure: COLONOSCOPY;  Surgeon: [name], MD;  [facility];  Service: Gastroenterology;  Laterality: N/A;   ESOPHAGEAL DILATION N/A 04/22/2019   Procedure: ESOPHAGEAL DILATION;  Surgeon: Rogene Houston, MD;  [facility];  Service: Endoscopy;  Laterality: N/A;   ESOPHAGOGASTRODUODENOSCOPY N/A 04/22/2019    Procedure: ESOPHAGOGASTRODUODENOSCOPY (EGD);  Surgeon: Rogene Houston, MD;  [facility];  Service: Endoscopy;  Laterality: N/A;  145   EXTRACORPOREAL SHOCK WAVE LITHOTRIPSY Left 03/04/2017   Procedure: LEFT EXTRACORPOREAL SHOCK WAVE LITHOTRIPSY (ESWL);  Surgeon: [name], MD;  [facility];  Service: Urology;  Laterality: Left;   FOOT SURGERY Right 1980's   calcium deposit removed   LUMBAR LAMINECTOMY/DECOMPRESSION MICRODISCECTOMY Right 09/01/2013   Procedure: RIGHT LUMBAR FOUR-FIVE LAMINECTOMY/DECOMPRESSION MICRODISCECTOMY 1 LEVEL;  Surgeon: [name], MD;  Location: [place] NEUR[facility] ORS;  Service: Neurosurgery;  Laterality: Right;  Right L45 microdiskectomy   prostate biopsy  2013   SHOULDER OPEN ROTATOR CUFF REPAIR Left 09/09/2012   Procedure: LEFT ROTATOR CUFF REPAIR SHOULDER OPEN;  Surgeon: [name], MD;  [facility];  Service: Orthopedics;  Laterality: Left;    Home Medications:  Allergies as of 01/31/2021       Reactions   Latex Other (See Comments)   "sores in mouth after teeth cleaned"        Medication List        Accurate as of January 31, 2021  3:49 PM. If you have any questions, ask your nurse or doctor.          cholecalciferol 25 MCG (1000 UNIT) tablet Commonly known  as: VITAMIN D Take 1,000 Units by mouth daily with lunch.   Eliquis 5 MG Tabs tablet Generic drug: apixaban TAKE 1 TABLET(5 MG) BY MOUTH TWICE DAILY   losartan 100 MG tablet Commonly known as: COZAAR Take 100 mg by mouth daily.   meclizine 25 MG tablet Commonly known as: ANTIVERT SMARTSIG:2 By Mouth Twice Daily PRN   metoprolol tartrate 25 MG tablet Commonly known as: LOPRESSOR TAKE 1 TABLET(25 MG) BY MOUTH TWICE DAILY   multivitamin with minerals Tabs tablet Take 1 tablet by mouth daily.   oxybutynin 15 MG 24 hr tablet Commonly known as: DITROPAN XL Take 15 mg by mouth daily.   PAIN RELIEF ROLL-ON EX Apply 1 application topically daily as  needed (applied to feet if needed for pain.).   phenylephrine 10 MG Tabs tablet Commonly known as: SUDAFED PE Take 10 mg by mouth every 4 (four) hours as needed (congestion).   triamcinolone cream 0.1 % Commonly known as: KENALOG Apply 1 application topically 2 (two) times daily.   vitamin B-12 1000 MCG tablet Commonly known as: CYANOCOBALAMIN Take 1,000 mcg by mouth every evening.        Allergies:  Allergies  Allergen Reactions   Latex Other (See Comments)    "sores in mouth after teeth cleaned"    No family history on file.  Social History:  reports that he quit smoking about 54 years ago. His smoking use included cigarettes. He has a 5.00 pack-year smoking history. He has never used smokeless tobacco. He reports current alcohol use. He reports that he does not use drugs.  ROS: A complete review of systems was performed.  All systems are negative except for pertinent findings as noted.  Physical Exam:  Vital signs in last 24 hours: BP (!) 151/89    Pulse (!) 54  Constitutional:  Alert and oriented, No acute distress Cardiovascular: Regular rate  Respiratory: Normal respiratory effort GI: Abdomen is soft, nontender, nondistended, no abdominal masses. No CVAT.  No inguinal hernias Genitourinary: Normal male phallus circumcised but buried, testes are descended bilaterally and non-tender and without masses, scrotum is normal in appearance without lesions or masses, perineum is normal on inspection. Lymphatic: No lymphadenopathy Neurologic: Grossly intact, no focal deficits Psychiatric: Normal mood and affect  I have reviewed prior pt note  I have reviewed urinalysis results  I have independently reviewed prior imaging-CT from 2018 reviewed with patient  I have reviewed prior PSA/pathology results  Bladder scan volume today 35 mL    Impression/Assessment:  1.  History of prostate cancer.  Status post external beam radiotherapy in 2013 with no evidence of  recurrence to date although last seen over 2 years ago  2.  He does have microscopic hematuria today  3.  Overactive bladder symptoms, bothersome, on Ditropan XL 15 mg  4.  History of urolithiasis, no current symptoms  Plan:  1.  For now, he will stop Ditropan XL and I will start Myrbetriq 50 mg a day-he was given 3 months of samples  2.  I will have him come back in 3 months to recheck urine for hematuria  3.  PSA was checked today

## 2021-01-31 NOTE — Progress Notes (Signed)
post void residual 35ml  

## 2021-01-31 NOTE — Progress Notes (Signed)
Urological Symptom Review  Patient is experiencing the following symptoms: Frequent urination Hard to postpone urination Get up at night to urinate Leakage of urine Erection problems (male only)   Review of Systems  Gastrointestinal (upper)  : Negative for upper GI symptoms  Gastrointestinal (lower) : Negative for lower GI symptoms  Constitutional : Negative for symptoms  Skin: Negative for skin symptoms  Eyes: Negative for eye symptoms  Ear/Nose/Throat : Negative for Ear/Nose/Throat symptoms  Hematologic/Lymphatic: Negative for Hematologic/Lymphatic symptoms  Cardiovascular : Negative for cardiovascular symptoms  Respiratory : Negative for respiratory symptoms  Endocrine: Negative for endocrine symptoms  Musculoskeletal: Negative for musculoskeletal symptoms  Neurological: Dizziness  Psychologic: Negative for psychiatric symptoms

## 2021-02-01 LAB — PSA: Prostate Specific Ag, Serum: <0.1 ng/mL (ref 0.0–4.0)

## 2021-02-02 DIAGNOSIS — J329 Chronic sinusitis, unspecified: Secondary | ICD-10-CM | POA: Diagnosis not present

## 2021-02-20 ENCOUNTER — Encounter (HOSPITAL_COMMUNITY): Payer: Self-pay | Admitting: Cardiovascular Disease

## 2021-02-21 ENCOUNTER — Other Ambulatory Visit (HOSPITAL_COMMUNITY)
Admission: RE | Admit: 2021-02-21 | Discharge: 2021-02-21 | Disposition: A | Discharge status: Home / Self Care | Payer: Medicare Other | Source: Ambulatory Visit | Attending: Cardiovascular Disease | Admitting: Cardiovascular Disease

## 2021-02-21 ENCOUNTER — Other Ambulatory Visit: Payer: Self-pay

## 2021-02-21 ENCOUNTER — Encounter: Payer: Self-pay | Admitting: *Deleted

## 2021-02-21 DIAGNOSIS — I482 Chronic atrial fibrillation, unspecified: Secondary | ICD-10-CM | POA: Insufficient documentation

## 2021-02-21 LAB — BASIC METABOLIC PANEL
Anion gap: 10 (ref 5–15)
BUN: 27 mg/dL — ABNORMAL HIGH (ref 8–23)
CO2: 25 mmol/L (ref 22–32)
Calcium: 9.2 mg/dL (ref 8.9–10.3)
Chloride: 104 mmol/L (ref 98–111)
Creatinine, Ser: 1.1 mg/dL (ref 0.61–1.24)
GFR, Estimated: >60 mL/min (ref >60)
Glucose, Bld: 154 mg/dL — ABNORMAL HIGH (ref 70–99)
Potassium: 3.7 mmol/L (ref 3.5–5.1)
Sodium: 139 mmol/L (ref 135–145)

## 2021-02-21 LAB — CBC
HCT: 44.7 % (ref 39.0–52.0)
Hemoglobin: 15.2 g/dL (ref 13.0–17.0)
MCH: 30.4 pg (ref 26.0–34.0)
MCHC: 34 g/dL (ref 30.0–36.0)
MCV: 89.4 fL (ref 80.0–100.0)
Platelets: 202 10*3/uL (ref 150–400)
RBC: 5 MIL/uL (ref 4.22–5.81)
RDW: 12.4 % (ref 11.5–15.5)
WBC: 7.9 10*3/uL (ref 4.0–10.5)
nRBC: 0 % (ref 0.0–0.2)

## 2021-02-22 NOTE — Progress Notes (Signed)
This encounter was created in error - please disregard.

## 2021-02-27 DIAGNOSIS — Z20822 Contact with and (suspected) exposure to covid-19: Secondary | ICD-10-CM | POA: Diagnosis not present

## 2021-02-28 ENCOUNTER — Encounter (HOSPITAL_COMMUNITY)
Admission: RE | Disposition: A | Discharge status: Home / Self Care | Payer: Self-pay | Source: Home / Self Care | Attending: Cardiovascular Disease

## 2021-02-28 ENCOUNTER — Encounter (HOSPITAL_COMMUNITY): Payer: Self-pay | Admitting: Cardiovascular Disease

## 2021-02-28 ENCOUNTER — Ambulatory Visit (HOSPITAL_COMMUNITY): Payer: Medicare Other | Admitting: Certified Registered"

## 2021-02-28 ENCOUNTER — Other Ambulatory Visit: Payer: Self-pay

## 2021-02-28 ENCOUNTER — Ambulatory Visit (HOSPITAL_COMMUNITY)
Admission: RE | Admit: 2021-02-28 | Discharge: 2021-02-28 | Disposition: A | Discharge status: Home / Self Care | Payer: Medicare Other | Attending: Cardiovascular Disease | Admitting: Cardiovascular Disease

## 2021-02-28 DIAGNOSIS — E119 Type 2 diabetes mellitus without complications: Secondary | ICD-10-CM | POA: Insufficient documentation

## 2021-02-28 DIAGNOSIS — I4891 Unspecified atrial fibrillation: Secondary | ICD-10-CM | POA: Diagnosis not present

## 2021-02-28 DIAGNOSIS — I4819 Other persistent atrial fibrillation: Secondary | ICD-10-CM

## 2021-02-28 DIAGNOSIS — Z923 Personal history of irradiation: Secondary | ICD-10-CM | POA: Diagnosis not present

## 2021-02-28 DIAGNOSIS — Z87891 Personal history of nicotine dependence: Secondary | ICD-10-CM | POA: Diagnosis not present

## 2021-02-28 DIAGNOSIS — K219 Gastro-esophageal reflux disease without esophagitis: Secondary | ICD-10-CM | POA: Insufficient documentation

## 2021-02-28 DIAGNOSIS — M21372 Foot drop, left foot: Secondary | ICD-10-CM | POA: Insufficient documentation

## 2021-02-28 DIAGNOSIS — M1990 Unspecified osteoarthritis, unspecified site: Secondary | ICD-10-CM | POA: Diagnosis not present

## 2021-02-28 DIAGNOSIS — E669 Obesity, unspecified: Secondary | ICD-10-CM | POA: Diagnosis not present

## 2021-02-28 DIAGNOSIS — Z8546 Personal history of malignant neoplasm of prostate: Secondary | ICD-10-CM | POA: Insufficient documentation

## 2021-02-28 DIAGNOSIS — Z6836 Body mass index (BMI) 36.0-36.9, adult: Secondary | ICD-10-CM | POA: Diagnosis not present

## 2021-02-28 DIAGNOSIS — I480 Paroxysmal atrial fibrillation: Secondary | ICD-10-CM | POA: Diagnosis not present

## 2021-02-28 DIAGNOSIS — M21371 Foot drop, right foot: Secondary | ICD-10-CM | POA: Diagnosis not present

## 2021-02-28 DIAGNOSIS — I10 Essential (primary) hypertension: Secondary | ICD-10-CM | POA: Diagnosis not present

## 2021-02-28 DIAGNOSIS — E785 Hyperlipidemia, unspecified: Secondary | ICD-10-CM | POA: Diagnosis not present

## 2021-02-28 HISTORY — PX: CARDIOVERSION: SHX1299

## 2021-02-28 SURGERY — CARDIOVERSION
Anesthesia: General

## 2021-02-28 MED ORDER — PROPOFOL 10 MG/ML IV BOLUS
INTRAVENOUS | Status: DC | PRN
  Administered 2021-02-28: 150 mg via INTRAVENOUS

## 2021-02-28 MED ORDER — AMLODIPINE BESYLATE 2.5 MG PO TABS: 2.5000 mg | ORAL_TABLET | Freq: Every day | ORAL | 11 refills | Status: DC

## 2021-02-28 MED ORDER — SODIUM CHLORIDE 0.9 % IV SOLN: INTRAVENOUS | Status: DC

## 2021-02-28 MED ORDER — LIDOCAINE HCL (CARDIAC) PF 100 MG/5ML IV SOSY
PREFILLED_SYRINGE | INTRAVENOUS | Status: DC | PRN
  Administered 2021-02-28: 40 mg via INTRAVENOUS

## 2021-02-28 NOTE — Discharge Instructions (Addendum)

## 2021-02-28 NOTE — Anesthesia Postprocedure Evaluation (Signed)
Anesthesia Post Note  Patient: James Dyer  Procedure(s) Performed: CARDIOVERSION     Patient location during evaluation: Endoscopy Anesthesia Type: General Level of consciousness: awake and alert, patient cooperative and oriented Pain management: pain level controlled Vital Signs Assessment: post-procedure vital signs reviewed and stable Respiratory status: spontaneous breathing, nonlabored ventilation and respiratory function stable Cardiovascular status: blood pressure returned to baseline and stable Postop Assessment: no apparent nausea or vomiting and able to ambulate Anesthetic complications: no   No notable events documented.  Last Vitals:  Vitals:   02/28/21 1024 02/28/21 1030  BP: (!) 144/93 (!) 169/102  Pulse: 70   Resp: 20   Temp: (!) 35.9 C   SpO2: 95%     Last Pain:  Vitals:   02/28/21 1024  TempSrc: Temporal  PainSc:                  Cortez Flippen,E. Monetta Lick

## 2021-02-28 NOTE — Progress Notes (Signed)
CARDIOLOGY CONSULT NOTE       Patient ID: James Dyer MRN: [id number] DOB/AGE: 76/04/1945 76 y.o.  Admit date: (Not on file) Referring Physician: [name] Primary Physician: [name], MD Primary Cardiologist: Johnsie Cancel Reason for Consultation: Afib  Active Problems:   * No active hospital problems. *    HPI: 76 y.o. with history of PAF, HTN, HLD,DM-2, GERD, Dysphagia and chronic neuromuscular Sx bilateral foot drop. No known CAD non ischemic myovue 2015. Found to be in asymptomatic afib 12/22/19 Started on lopressor and eliquis TTE 12/28/19 with EF 50-55% LA 48 mm no significant valve disease Patient deferred [facility]. Has chronic bilateral shoulder injuries with  Previous surgery on left Recent cortisone shot on right   CHADVASC 3   He has not had any dyspnea, palpitations, chest pain or syncope  Long discussion with him about utility of one effort to restore NSR Higher risk for CHF, CE, MR over time. Risks including stroke, PPM, intubation discussed willing to proceed  He has worsening incontinence [place] [facility] Ditropan XL not working Seen by [employer] 01/31/21 history prostate cancer with IMRT 2013 left uretal stone with lithotripsy 2019 Overactive bladder Ditropan XL stopped and started on Myrbetriq 50 mg PSA normal 01/31/21   [place] with Dr [name] 02/28/21 with conversion BP noted to be high even sedated Had ERAF And is in rate controlled afib today Long discussion about AAT and lifestyle modifications He needs to lose wait and f/u with Dr Lennox Grumbles for sleep study  Not currently interested in AAT or repeat conversion    ROS All other systems reviewed and negative except as noted above  Past Medical History:  Diagnosis Date   Arthritis    HNP- lumbar   Borderline diabetes    Cancer ([place]) 2013   prostate , treated /w radiation    Diabetes mellitus without complication ([place])    Foot drop, bilateral since birthpt wears braces in shoes   pt must have braces  to walk   Hepatitis    "pt told had been exposed to hepatitis when he went to donate blood"   History of kidney stones    History of radiation therapy 04/12/11 - 06/06/11   prostate, seminal vesicles   History of stress test    Echocardiogram done - 03/2013   Hypercholesterolemia    states Dr. Armandina Gemma Rx Welchol- pt. hs stopped taking because he believe it gives him  leg cramps.   Hypertension    Nephrolithiasis    renal calculi- , also has been told that he has a cyst on kidney - R side    Neuromuscular disorder ([place])    bilateral foot drop- both feet     No family history on file.  Social History   Socioeconomic History   Marital status: Married    Spouse name: Not on file   Number of children: Not on file   Years of education: Not on file   Highest education level: Not on file  Occupational History   Not on file  Tobacco Use   Smoking status: Former    Packs/day: 1.00    Years: 5.00    Pack years: 5.00    Types: Cigarettes    Quit date: 12/18/1966    Years since quitting: 54.2   Smokeless tobacco: Never  Vaping Use   Vaping Use: Never used  Substance and Sexual Activity   Alcohol use: Yes    Comment: occasional beer   Drug use: No  Sexual activity: Never  Other Topics Concern   Not on file  Social History Narrative   Not on file   Social Determinants of Health   Financial Resource Strain: Not on file  Food Insecurity: Not on file  Transportation Needs: Not on file  Physical Activity: Not on file  Stress: Not on file  Social Connections: Not on file  Intimate Partner Violence: Not on file    Past Surgical History:  Procedure Laterality Date   BACK SURGERY     CARDIOVERSION N/A 02/28/2021   Procedure: CARDIOVERSION;  Surgeon: Sanda Klein, MD;  Location: [place];  Service: Cardiovascular;  Laterality: N/A;   COLONOSCOPY N/A 07/22/2012   Procedure: COLONOSCOPY;  Surgeon: [name], MD;  [facility];  Service: Gastroenterology;   Laterality: N/A;   ESOPHAGEAL DILATION N/A 04/22/2019   Procedure: ESOPHAGEAL DILATION;  Surgeon: Rogene Houston, MD;  [facility];  Service: Endoscopy;  Laterality: N/A;   ESOPHAGOGASTRODUODENOSCOPY N/A 04/22/2019   Procedure: ESOPHAGOGASTRODUODENOSCOPY (EGD);  Surgeon: Rogene Houston, MD;  [facility];  Service: Endoscopy;  Laterality: N/A;  145   EXTRACORPOREAL SHOCK WAVE LITHOTRIPSY Left 03/04/2017   Procedure: LEFT EXTRACORPOREAL SHOCK WAVE LITHOTRIPSY (ESWL);  Surgeon: [name], MD;  [facility];  Service: Urology;  Laterality: Left;   FOOT SURGERY Right 1980's   calcium deposit removed   LUMBAR LAMINECTOMY/DECOMPRESSION MICRODISCECTOMY Right 09/01/2013   Procedure: RIGHT LUMBAR FOUR-FIVE LAMINECTOMY/DECOMPRESSION MICRODISCECTOMY 1 LEVEL;  Surgeon: [name], MD;  Location: [place] [facility];  Service: Neurosurgery;  Laterality: Right;  Right L45 microdiskectomy   prostate biopsy  2013   SHOULDER OPEN ROTATOR CUFF REPAIR Left 09/09/2012   Procedure: LEFT ROTATOR CUFF REPAIR SHOULDER OPEN;  Surgeon: [name], MD;  [facility];  Service: Orthopedics;  Laterality: Left;      Current Outpatient Medications:    amLODipine (NORVASC) 2.5 MG tablet, Take 1 tablet (2.5 mg total) by mouth daily., Disp: 30 tablet, Rfl: 11   apixaban (ELIQUIS) 5 MG TABS tablet, TAKE 1 TABLET(5 MG) BY MOUTH TWICE DAILY, Disp: 60 tablet, Rfl: 5   cholecalciferol (VITAMIN D) 25 MCG (1000 UNIT) tablet, Take 1,000 Units by mouth daily with lunch., Disp: , Rfl:    fluticasone (FLONASE) 50 MCG/ACT nasal spray, Place 2 sprays into both nostrils daily as needed for allergies or rhinitis., Disp: , Rfl:    Lidocaine HCl (PAIN RELIEF ROLL-ON EX), Apply 1 application topically daily as needed (applied to feet if needed for pain.)., Disp: , Rfl:    Lidocaine HCl 4 % CREA, Apply 1 application topically daily., Disp: , Rfl:    losartan (COZAAR) 100 MG tablet, Take 100 mg by  mouth daily., Disp: , Rfl:    meclizine (ANTIVERT) 25 MG tablet, Take 25 mg by mouth 2 (two) times daily as needed for dizziness., Disp: , Rfl:    metoprolol tartrate (LOPRESSOR) 25 MG tablet, TAKE 1 TABLET(25 MG) BY MOUTH TWICE DAILY, Disp: 180 tablet, Rfl: 1   mirabegron ER (MYRBETRIQ) 50 MG TB24 tablet, Take 50 mg by mouth daily., Disp: , Rfl:    Multiple Vitamin (MULTIVITAMIN WITH MINERALS) TABS tablet, Take 1 tablet by mouth daily., Disp: , Rfl:    phenylephrine (SUDAFED PE) 10 MG TABS tablet, Take 10 mg by mouth every 4 (four) hours as needed (congestion)., Disp: , Rfl:    triamcinolone cream (KENALOG) 0.1 %, Apply 1 application topically 2 (two) times daily. (Patient taking differently:  Apply 1 application topically 2 (two) times daily as needed (irritation).), Disp: 454 g, Rfl: 0   Turmeric 500 MG CAPS, Take 500 mg by mouth daily., Disp: , Rfl:    vitamin B-12 (CYANOCOBALAMIN) 1000 MCG tablet, Take 1,000 mcg by mouth every evening. , Disp: , Rfl:     Physical Exam: Blood pressure 138/88, pulse 80, height 5\' 9"  (1.753 m), weight 260 lb (117.9 kg), SpO2 98 %.    Affect appropriate Healthy:  appears stated age 83: normal Neck supple with no adenopathy JVP normal no bruits no thyromegaly Lungs clear with no wheezing and good diaphragmatic motion Heart:  S1/S2 no murmur, no rub, gallop or click PMI normal Abdomen: benighn, BS positve, no tenderness, no AAA no bruit.  No HSM or HJR Distal pulses intact with no bruits No edema Bilateral foot drop with braces  Skin warm and dry Bilateral leg splints from foot drop    Labs:   Lab Results  Component Value Date   WBC 7.9 02/21/2021   HGB 15.2 02/21/2021   HCT 44.7 02/21/2021   MCV 89.4 02/21/2021   PLT 202 02/21/2021   No results for input(s): NA, K, CL, CO2, BUN, CREATININE, CALCIUM, PROT, BILITOT, ALKPHOS, ALT, AST, GLUCOSE in the last 168 hours.  Invalid input(s): LABALBU Lab Results  Component Value Date   TROPONINI  <0.30 03/14/2012   No results found for: CHOL No results found for: HDL No results found for: LDLCALC No results found for: TRIG No results found for: CHOLHDL No results found for: LDLDIRECT    Radiology: No results found.  EKG: SR PVC rate 74 otherwise normal 2013   ASSESSMENT AND PLAN:   1. Afib:  Continue eliquis and lopressor rate control and anticoagulation Weight loss and sleep study per primary  2. DM:  Discussed low carb diet.  Target hemoglobin A1c is 6.5 or less.  Continue current medications. 3.HTN:  Well controlled.  Continue current medications and low sodium Dash type diet.   4. Dysphagia:  Normal EGD April 22 2019 f/u GI  5. Otho:  Encouraged him to get MRI and see ortho as he is getting a frozen shoulder  6. Dizzy/Malaise:  Not clear that any of his symptoms related to his heart On blood thinner Hct Was normal 05/04/20  7. Urology:  f/u with Dr Eulogio Ditch Incontinence worse Ditropan not effective Started on Myrbetriq    F/u in 3 months   Signed: Jenkins Rouge 03/08/2021, 9:22 AM

## 2021-02-28 NOTE — H&P (Signed)
Cardiology Admission History and Physical:   Patient ID: James Dyer MRN: [id number]; DOB: [birth year]   Admission date: 02/28/2021  PCP:  [name], MD   [facility] Providers Cardiologist:  None        Chief Complaint:  atrial fibrillation   Patient Profile:   James Dyer is a 76 y.o. male with atrial fibrillation  who is being seen 02/28/2021 for electrical cardioversion  History of Present Illness:   Mr. James Dyer is a 76 y.o. with history of PAF, HTN, HLD,DM-2, GERD, Dysphagia and chronic neuromuscular Sx bilateral foot drop. No known CAD non ischemic myovue 2015. Found to be in asymptomatic afib 12/22/19 Started on lopressor and eliquis TTE 12/28/19 with EF 50-55% LA 48 mm no significant valve disease Patient deferred [facility]. Has chronic bilateral shoulder injuries with  Previous surgery on left Recent cortisone shot on right    CHADVASC 4    He has not had any dyspnea, palpitations, chest pain or syncope   Long discussion with him about utility of one effort to restore NSR Higher risk for CHF, CE, MR over time. Risks including stroke, PPM, intubation discussed willing to proceed   He has worsening incontinence [place] [facility] Ditropan XL not working   Past Medical History:  Diagnosis Date   Arthritis    HNP- lumbar   Borderline diabetes    Cancer ([place]) 2013   prostate , treated /w radiation    Diabetes mellitus without complication ([place])    Foot drop, bilateral since birthpt wears braces in shoes   pt must have braces to walk   Hepatitis    "pt told had been exposed to hepatitis when he went to donate blood"   History of kidney stones    History of radiation therapy 04/12/11 - 06/06/11   prostate, seminal vesicles   History of stress test    Echocardiogram done - 03/2013   Hypercholesterolemia    states Dr. Armandina Gemma Rx Welchol- pt. hs stopped taking because he believe it gives him  leg cramps.   Hypertension    Nephrolithiasis    renal  calculi- , also has been told that he has a cyst on kidney - R side    Neuromuscular disorder ([place])    bilateral foot drop- both feet     Past Surgical History:  Procedure Laterality Date   BACK SURGERY     COLONOSCOPY N/A 07/22/2012   Procedure: COLONOSCOPY;  Surgeon: [name], MD;  [facility];  Service: Gastroenterology;  Laterality: N/A;   ESOPHAGEAL DILATION N/A 04/22/2019   Procedure: ESOPHAGEAL DILATION;  Surgeon: Rogene Houston, MD;  [facility];  Service: Endoscopy;  Laterality: N/A;   ESOPHAGOGASTRODUODENOSCOPY N/A 04/22/2019   Procedure: ESOPHAGOGASTRODUODENOSCOPY (EGD);  Surgeon: Rogene Houston, MD;  [facility];  Service: Endoscopy;  Laterality: N/A;  145   EXTRACORPOREAL SHOCK WAVE LITHOTRIPSY Left 03/04/2017   Procedure: LEFT EXTRACORPOREAL SHOCK WAVE LITHOTRIPSY (ESWL);  Surgeon: [name], MD;  [facility];  Service: Urology;  Laterality: Left;   FOOT SURGERY Right 1980's   calcium deposit removed   LUMBAR LAMINECTOMY/DECOMPRESSION MICRODISCECTOMY Right 09/01/2013   Procedure: RIGHT LUMBAR FOUR-FIVE LAMINECTOMY/DECOMPRESSION MICRODISCECTOMY 1 LEVEL;  Surgeon: [name], MD;  Location: [place] [facility];  Service: Neurosurgery;  Laterality: Right;  Right L45 microdiskectomy   prostate biopsy  2013   SHOULDER OPEN ROTATOR CUFF REPAIR Left 09/09/2012   Procedure: LEFT ROTATOR CUFF REPAIR SHOULDER  OPEN;  Surgeon: [name], MD;  [facility];  Service: Orthopedics;  Laterality: Left;     Medications Prior to Admission: Prior to Admission medications   Medication Sig Start Date End Date Taking? Authorizing Provider  apixaban (ELIQUIS) 5 MG TABS tablet TAKE 1 TABLET(5 MG) BY MOUTH TWICE DAILY 01/10/21  Yes Josue Hector, MD  cholecalciferol (VITAMIN D) 25 MCG (1000 UNIT) tablet Take 1,000 Units by mouth daily with lunch.   Yes [provider]  fluticasone (FLONASE) 50 MCG/ACT nasal spray Place 2  sprays into both nostrils daily as needed for allergies or rhinitis.   Yes [provider]  Lidocaine HCl (ASPERCREME LIDOCAINE) 4 % CREA Apply 1 application topically daily.   Yes [provider]  Lidocaine HCl (PAIN RELIEF ROLL-ON EX) Apply 1 application topically daily as needed (applied to feet if needed for pain.).   Yes [provider]  losartan (COZAAR) 100 MG tablet Take 100 mg by mouth daily.   Yes [provider]  meclizine (ANTIVERT) 25 MG tablet Take 25 mg by mouth 2 (two) times daily as needed for dizziness. 05/04/20  Yes [provider]  metoprolol tartrate (LOPRESSOR) 25 MG tablet TAKE 1 TABLET(25 MG) BY MOUTH TWICE DAILY 12/26/20  Yes Josue Hector, MD  mirabegron ER (MYRBETRIQ) 50 MG TB24 tablet Take 50 mg by mouth daily.   Yes [provider]  Multiple Vitamin (MULTIVITAMIN WITH MINERALS) TABS tablet Take 1 tablet by mouth daily.   Yes [provider]  phenylephrine (SUDAFED PE) 10 MG TABS tablet Take 10 mg by mouth every 4 (four) hours as needed (congestion).   Yes [provider]  triamcinolone cream (KENALOG) 0.1 % Apply 1 application topically 2 (two) times daily. Patient taking differently: Apply 1 application topically 2 (two) times daily as needed (irritation). 09/13/20  Yes Scot Jun, FNP  Turmeric 500 MG CAPS Take 500 mg by mouth daily.   Yes [provider]  vitamin B-12 (CYANOCOBALAMIN) 1000 MCG tablet Take 1,000 mcg by mouth every evening.    Yes [provider]     Allergies:    Allergies  Allergen Reactions   Latex Other (See Comments)    "sores in mouth after teeth cleaned"    Social History:   Social History   Socioeconomic History   Marital status: Married    Spouse name: Not on file   Number of children: Not on file   Years of education: Not on file   Highest education level: Not on file  Occupational History   Not on file  Tobacco Use   Smoking  status: Former    Packs/day: 1.00    Years: 5.00    Pack years: 5.00    Types: Cigarettes    Quit date: 12/18/1966    Years since quitting: 54.2   Smokeless tobacco: Never  Vaping Use   Vaping Use: Never used  Substance and Sexual Activity   Alcohol use: Yes    Comment: occasional beer   Drug use: No   Sexual activity: Never  Other Topics Concern   Not on file  Social History Narrative   Not on file   Social Determinants of Health   Financial Resource Strain: Not on file  Food Insecurity: Not on file  Transportation Needs: Not on file  Physical Activity: Not on file  Stress: Not on file  Social Connections: Not on file  Intimate Partner Violence: Not on file  Family History:   The patient's family history is significant for the absence of premature onset heart disease  ROS:  Please see the history of present illness.  All other ROS reviewed and negative.     Physical Exam/Data:   Vitals:   02/28/21 0904  BP: (!) 166/123  Pulse: 90  Resp: (!) 23  Temp: 97.6 F (36.4 C)  TempSrc: Temporal  SpO2: 96%  Weight: 113.4 kg  Height: 5\' 9"  (1.753 m)   No intake or output data in the 24 hours ending 02/28/21 0915 Last 3 Weights 02/28/2021 01/04/2021 05/11/2020  Weight (lbs) 250 lb 253 lb 9.6 oz 243 lb  Weight (kg) 113.399 kg 115.032 kg 110.224 kg     Body mass index is 36.92 kg/m.  General:  Well nourished, well developed, in no acute distress, obese HEENT: normal Neck: no JVD Vascular: No carotid bruits; Distal pulses 2+ bilaterally   Cardiac:  normal S1, S2; irregular; no murmur  Lungs:  clear to auscultation bilaterally, no wheezing, rhonchi or rales  Abd: soft, nontender, no hepatomegaly  Ext: no edema Musculoskeletal:  No deformities, BUE and BLE strength normal and equal Skin: warm and dry  Neuro:  CNs 2-12 intact, no focal abnormalities noted Psych:  Normal affect    EKG:  telemetry shows atrial fibrillation with controlled rate  Relevant CV  Studies: ECHO 12/28/2019   1. Left ventricular ejection fraction, by estimation, is 50 to 55%. The  left ventricle has low normal function. The left ventricle has no regional  wall motion abnormalities. There is moderate left ventricular hypertrophy.  Left ventricular diastolic  parameters are indeterminate in the setting of atrial fibrillation.   2. Right ventricular systolic function is normal. The right ventricular  size is normal.   3. The mitral valve is grossly normal. Trivial mitral valve  regurgitation.   4. The aortic valve is tricuspid. Aortic valve regurgitation is not  visualized.   5. The inferior vena cava is normal in size with greater than 50%  respiratory variability, suggesting right atrial pressure of 3 mmHg.   Laboratory Data:  High Sensitivity Troponin:  No results for input(s): TROPONINIHS in the last 720 hours.    ChemistryNo results for input(s): NA, K, CL, CO2, GLUCOSE, BUN, CREATININE, CALCIUM, MG, GFRNONAA, GFRAA, ANIONGAP in the last 168 hours.  No results for input(s): PROT, ALBUMIN, AST, ALT, ALKPHOS, BILITOT in the last 168 hours. Lipids No results for input(s): CHOL, TRIG, HDL, LABVLDL, LDLCALC, CHOLHDL in the last 168 hours. HematologyNo results for input(s): WBC, RBC, HGB, HCT, MCV, MCH, MCHC, RDW, PLT in the last 168 hours. Thyroid No results for input(s): TSH, FREET4 in the last 168 hours. BNPNo results for input(s): BNP, PROBNP in the last 168 hours.  DDimer No results for input(s): DDIMER in the last 168 hours.   Radiology/Studies:  No results found.   Assessment and Plan:   Plan an attempt at DCCV, but arrhythmia appears to be >1 year in duration. This procedure has been fully reviewed with the patient and written informed consent has been obtained.    Risk Assessment/Risk Scores:         CHA2DS2-VASc Score = 4   This indicates a 4.8% annual risk of stroke. The patient's score is based upon: CHF History: 0 HTN History: 1 Diabetes  History: 1 Stroke History: 0 Vascular Disease History: 0 Age Score: 2 Gender Score: 0      Signed, Sanda Klein, MD  02/28/2021 9:15 AM

## 2021-02-28 NOTE — Transfer of Care (Signed)
Immediate Anesthesia Transfer of Care Note  Patient: James Dyer  Procedure(s) Performed: CARDIOVERSION  Patient Location: PACU  Anesthesia Type:General  Level of Consciousness: drowsy and patient cooperative  Airway & Oxygen Therapy: Patient Spontanous Breathing  Post-op Assessment: Report given to RN and Post -op Vital signs reviewed and stable  Post vital signs: Reviewed and stable  Last Vitals:  Vitals Value Taken Time  BP 172/100 02/28/21 1014  Temp    Pulse 74 02/28/21 1015  Resp 20 02/28/21 1015  SpO2 97 % 02/28/21 1015  Vitals shown include unvalidated device data.  Last Pain:  Vitals:   02/28/21 0904  TempSrc: Temporal  PainSc: 0-No pain         Complications: No notable events documented.

## 2021-02-28 NOTE — Anesthesia Preprocedure Evaluation (Signed)
Anesthesia Evaluation  Patient identified by MRN, date of birth, ID band Patient awake    Reviewed: Allergy & Precautions, NPO status , Patient's Chart, lab work & pertinent test results, reviewed documented beta blocker date and time   History of Anesthesia Complications Negative for: history of anesthetic complications  Airway Mallampati: II  TM Distance: >3 FB Neck ROM: Full    Dental  (+) Chipped, Dental Advisory Given   Pulmonary former smoker,    breath sounds clear to auscultation       Cardiovascular hypertension, Pt. on medications and Pt. on home beta blockers (-) angina Rhythm:Irregular Rate:Normal  '21 ECHO: EF 50- 55%. The LV has low normal function, no regional wall motion abnormalities. There is moderate LVH. RVF is normal, no significant valvular abnormalities   Neuro/Psych negative neurological ROS     GI/Hepatic negative GI ROS, Neg liver ROS,   Endo/Other  diabetes (glu 154)  Renal/GU negative Renal ROS   Prostate cancer    Musculoskeletal  (+) Arthritis ,   Abdominal (+) + obese,   Peds  Hematology eliquis   Anesthesia Other Findings   Reproductive/Obstetrics                             Anesthesia Physical Anesthesia Plan  ASA: 3  Anesthesia Plan: General   Post-op Pain Management: Minimal or no pain anticipated   Induction: Intravenous  PONV Risk Score and Plan: 2 and Treatment may vary due to age or medical condition  Airway Management Planned: Natural Airway and Mask  Additional Equipment: None  Intra-op Plan:   Post-operative Plan:   Informed Consent: I have reviewed the patients History and Physical, chart, labs and discussed the procedure including the risks, benefits and alternatives for the proposed anesthesia with the patient or authorized representative who has indicated his/her understanding and acceptance.     Dental advisory given  Plan  Discussed with: CRNA and Surgeon  Anesthesia Plan Comments:         Anesthesia Quick Evaluation

## 2021-02-28 NOTE — Op Note (Signed)
Procedure: Electrical Cardioversion Indications:  Atrial Fibrillation  Procedure Details:  Consent: Risks of procedure as well as the alternatives and risks of each were explained to the (patient/caregiver).  Consent for procedure obtained.  Time Out: Verified patient identification, verified procedure, site/side was marked, verified correct patient position, special equipment/implants available, medications/allergies/relevent history reviewed, required imaging and test results available.  Performed  Patient placed on cardiac monitor, pulse oximetry, supplemental oxygen as necessary.  Sedation given:  Propofol 150 mg IV, Dr. Glennon Mac Pacer pads placed anterior and posterior chest.  Cardioverted 1 time(s).  Cardioversion with synchronized biphasic 150J shock.  Evaluation: Findings: Post procedure EKG shows: NSR Complications: None Patient did tolerate procedure well.  BP remained very high even while sedated.  Time Spent Directly with the Patient:  30 minutes   James Dyer 02/28/2021, 10:05 AM

## 2021-03-02 DIAGNOSIS — Z20822 Contact with and (suspected) exposure to covid-19: Secondary | ICD-10-CM | POA: Diagnosis not present

## 2021-03-08 ENCOUNTER — Ambulatory Visit (INDEPENDENT_AMBULATORY_CARE_PROVIDER_SITE_OTHER): Payer: Medicare Other | Admitting: Cardiovascular Disease

## 2021-03-08 ENCOUNTER — Encounter: Payer: Self-pay | Admitting: Cardiovascular Disease

## 2021-03-08 ENCOUNTER — Other Ambulatory Visit: Payer: Self-pay

## 2021-03-08 VITALS — BP 138/88 | HR 80 | Ht 69.0 in | Wt 260.0 lb

## 2021-03-08 DIAGNOSIS — I10 Essential (primary) hypertension: Secondary | ICD-10-CM | POA: Diagnosis not present

## 2021-03-08 DIAGNOSIS — I4819 Other persistent atrial fibrillation: Secondary | ICD-10-CM | POA: Diagnosis not present

## 2021-03-08 NOTE — Patient Instructions (Signed)
Medication Instructions:  Your physician recommends that you continue on your current medications as directed. Please refer to the Current Medication list given to you today.  *If you need a refill on your cardiac medications before your next appointment, please call your pharmacy*   Lab Work: NONE   If you have labs (blood work) drawn today and your tests are completely normal, you will receive your results only by: [place] (if you have MyChart) OR A paper copy in the mail If you have any lab test that is abnormal or we need to change your treatment, we will call you to review the results.   Testing/Procedures: NONE    Follow-Up: At [facility], you and your health needs are our priority.  As part of our continuing mission to provide you with exceptional heart care, we have created designated Provider Care Teams.  These Care Teams include your primary Cardiologist (physician) and Advanced Practice Providers (APPs -  Physician Assistants and Nurse Practitioners) who all work together to provide you with the care you need, when you need it.  We recommend signing up for the patient portal called "MyChart".  Sign up information is provided on this After Visit Summary.  MyChart is used to connect with patients for Virtual Visits (Telemedicine).  Patients are able to view lab/test results, encounter notes, upcoming appointments, etc.  Non-urgent messages can be sent to your provider as well.   To learn more about what you can do with MyChart, go to [email or web address].    Your next appointment:   3 month(s)  The format for your next appointment:   In Person  Provider:   Jenkins Rouge, MD     Other Instructions Thank you for choosing [place]!

## 2021-03-13 DIAGNOSIS — E7849 Other hyperlipidemia: Secondary | ICD-10-CM | POA: Diagnosis not present

## 2021-03-13 DIAGNOSIS — E6609 Other obesity due to excess calories: Secondary | ICD-10-CM | POA: Diagnosis not present

## 2021-03-13 DIAGNOSIS — Z6837 Body mass index (BMI) 37.0-37.9, adult: Secondary | ICD-10-CM | POA: Diagnosis not present

## 2021-03-13 DIAGNOSIS — I4891 Unspecified atrial fibrillation: Secondary | ICD-10-CM | POA: Diagnosis not present

## 2021-03-13 DIAGNOSIS — I10 Essential (primary) hypertension: Secondary | ICD-10-CM | POA: Diagnosis not present

## 2021-03-13 DIAGNOSIS — E118 Type 2 diabetes mellitus with unspecified complications: Secondary | ICD-10-CM | POA: Diagnosis not present

## 2021-03-13 DIAGNOSIS — E538 Deficiency of other specified B group vitamins: Secondary | ICD-10-CM | POA: Diagnosis not present

## 2021-03-13 DIAGNOSIS — E782 Mixed hyperlipidemia: Secondary | ICD-10-CM | POA: Diagnosis not present

## 2021-03-23 DIAGNOSIS — Z20822 Contact with and (suspected) exposure to covid-19: Secondary | ICD-10-CM | POA: Diagnosis not present

## 2021-04-19 DIAGNOSIS — Z20822 Contact with and (suspected) exposure to covid-19: Secondary | ICD-10-CM | POA: Diagnosis not present

## 2021-04-25 DIAGNOSIS — Z20822 Contact with and (suspected) exposure to covid-19: Secondary | ICD-10-CM | POA: Diagnosis not present

## 2021-04-26 DIAGNOSIS — Z20822 Contact with and (suspected) exposure to covid-19: Secondary | ICD-10-CM | POA: Diagnosis not present

## 2021-04-28 DIAGNOSIS — Z20822 Contact with and (suspected) exposure to covid-19: Secondary | ICD-10-CM | POA: Diagnosis not present

## 2021-05-01 NOTE — Final Progress Note (Addendum)
? ?History of Present Illness: Here for follow-up of several issues:  ?  ?1.  History of prostate cancer.  10.25.2012: TRUS/Bx. Prostate volume 38 mL, PSA density 0.10.  1/12 cores revealed GS 3+4 pattern.  He completed IMRT on 5.8.2013. Most recent PSA 1.3.2023--<0.1. ? ?2.  History of urolithiasis.  Had lithotripsy for a left upper ureteral stone on 03/04/2017.  No symptoms of stone since then ? ?3.  Overactive bladder symptoms.  At his visit here in January, 2023 he was given Myrbetriq and taken off of oxybutynin 15 mg.  He still have significant lower urinary tract symptoms-frequency, urgency, occasional urgency incontinence, nocturia.  He drinks 5-6 caffeinated Sun drops a day. ? ?Past Medical History:  ?Diagnosis Date  ? Arthritis   ? HNP- lumbar  ? Borderline diabetes   ? Cancer [facility]) 2013  ? prostate , treated /w radiation   ? Diabetes mellitus without complication ([place])   ? Foot drop, bilateral since birthpt wears braces in shoes  ? pt must have braces to walk  ? Hepatitis   ? "pt told had been exposed to hepatitis when he went to donate blood"  ? History of kidney stones   ? History of radiation therapy 04/12/11 - 06/06/11  ? prostate, seminal vesicles  ? History of stress test   ? Echocardiogram done - 03/2013  ? Hypercholesterolemia   ? states Dr. Armandina Gemma Rx Welchol- pt. hs stopped taking because he believe it gives him  leg cramps.  ? Hypertension   ? Nephrolithiasis   ? renal calculi- , also has been told that he has a cyst on kidney - R side   ? Neuromuscular disorder ([place])   ? bilateral foot drop- both feet   ? ? ?Past Surgical History:  ?Procedure Laterality Date  ? BACK SURGERY    ? CARDIOVERSION N/A 02/28/2021  ? Procedure: CARDIOVERSION;  Surgeon: Sanda Klein, MD;  Location: [place];  Service: Cardiovascular;  Laterality: N/A;  ? COLONOSCOPY N/A 07/22/2012  ? Procedure: COLONOSCOPY;  Surgeon: [name], MD;  [facility];  Service: Gastroenterology;  Laterality: N/A;  ?  ESOPHAGEAL DILATION N/A 04/22/2019  ? Procedure: ESOPHAGEAL DILATION;  Surgeon: Rogene Houston, MD;  [facility];  Service: Endoscopy;  Laterality: N/A;  ? ESOPHAGOGASTRODUODENOSCOPY N/A 04/22/2019  ? Procedure: ESOPHAGOGASTRODUODENOSCOPY (EGD);  Surgeon: Rogene Houston, MD;  [facility];  Service: Endoscopy;  Laterality: N/A;  145  ? EXTRACORPOREAL SHOCK WAVE LITHOTRIPSY Left 03/04/2017  ? Procedure: LEFT EXTRACORPOREAL SHOCK WAVE LITHOTRIPSY (ESWL);  Surgeon: [name], MD;  [facility];  Service: Urology;  Laterality: Left;  ? FOOT SURGERY Right 1980's  ? calcium deposit removed  ? LUMBAR LAMINECTOMY/DECOMPRESSION MICRODISCECTOMY Right 09/01/2013  ? Procedure: RIGHT LUMBAR FOUR-FIVE LAMINECTOMY/DECOMPRESSION MICRODISCECTOMY 1 LEVEL;  Surgeon: [name], MD;  Location: [place] [facility];  Service: Neurosurgery;  Laterality: Right;  Right L45 microdiskectomy  ? prostate biopsy  2013  ? SHOULDER OPEN ROTATOR CUFF REPAIR Left 09/09/2012  ? Procedure: LEFT ROTATOR CUFF REPAIR SHOULDER OPEN;  Surgeon: [name], MD;  [facility];  Service: Orthopedics;  Laterality: Left;  ? ? ?Home Medications:  ?Allergies as of 05/02/2021   ? ?   Reactions  ? Latex Other (See Comments)  ? "sores in mouth after teeth cleaned"  ? ?  ? ?  ?Medication List  ?  ? ?  ? Accurate as of May 01, 2021  8:23 PM. If  you have any questions, ask your nurse or doctor.  ?  ?  ? ?  ? ?amLODipine 2.5 MG tablet ?Commonly known as: NORVASC ?Take 1 tablet (2.5 mg total) by mouth daily. ?  ?cholecalciferol 25 MCG (1000 UNIT) tablet ?Commonly known as: VITAMIN D ?Take 1,000 Units by mouth daily with lunch. ?  ?Eliquis 5 MG Tabs tablet ?Generic drug: apixaban ?TAKE 1 TABLET(5 MG) BY MOUTH TWICE DAILY ?  ?fluticasone 50 MCG/ACT nasal spray ?Commonly known as: FLONASE ?Place 2 sprays into both nostrils daily as needed for allergies or rhinitis. ?  ?losartan 100 MG tablet ?Commonly known as: COZAAR ?Take 100 mg  by mouth daily. ?  ?meclizine 25 MG tablet ?Commonly known as: ANTIVERT ?Take 25 mg by mouth 2 (two) times daily as needed for dizziness. ?  ?metoprolol tartrate 25 MG tablet ?Commonly known as: LOPRESSOR ?TAKE 1 TABLET(25 MG) BY MOUTH TWICE DAILY ?  ?mirabegron ER 50 MG Tb24 tablet ?Commonly known as: MYRBETRIQ ?Take 50 mg by mouth daily. ?  ?multivitamin with minerals Tabs tablet ?Take 1 tablet by mouth daily. ?  ?PAIN RELIEF ROLL-ON EX ?Apply 1 application topically daily as needed (applied to feet if needed for pain.). ?  ?Lidocaine HCl 4 % Crea ?Apply 1 application topically daily. ?  ?phenylephrine 10 MG Tabs tablet ?Commonly known as: SUDAFED PE ?Take 10 mg by mouth every 4 (four) hours as needed (congestion). ?  ?triamcinolone cream 0.1 % ?Commonly known as: KENALOG ?Apply 1 application topically 2 (two) times daily. ?What changed:  ?when to take this ?reasons to take this ?  ?Turmeric 500 MG Caps ?Take 500 mg by mouth daily. ?  ?vitamin B-12 1000 MCG tablet ?Commonly known as: CYANOCOBALAMIN ?Take 1,000 mcg by mouth every evening. ?  ? ?  ? ? ?Allergies:  ?Allergies  ?Allergen Reactions  ? Latex Other (See Comments)  ?  "sores in mouth after teeth cleaned"  ? ? ?No family history on file. ? ?Social History:  reports that he quit smoking about 54 years ago. His smoking use included cigarettes. He has a 5.00 pack-year smoking history. He has never used smokeless tobacco. He reports current alcohol use. He reports that he does not use drugs. ? ?ROS: ?A complete review of systems was performed.  All systems are negative except for pertinent findings as noted. ? ?Physical Exam:  ?Vital signs in last 24 hours: ?There were no vitals taken for this visit. ?Constitutional:  Alert and oriented, No acute distress ?Cardiovascular: Regular rate  ?Respiratory: Normal respiratory effort ?Neurologic: Grossly intact, no focal deficits ?Psychiatric: Normal mood and affect ? ?I have reviewed prior pt notes ? ?I have reviewed  urinalysis results ? ?I have independently reviewed prior imaging--prior bladder scan revealed negligible residual urine volume ? ?I have reviewed prior PSA results ? ? ? ?Impression/Assessment:  ?1.  History of prostate cancer, status post IMRT with excellent persistent PSA response ? ?2.  Lower urinary tract symptoms, bothersome despite overactive bladder medications.  He does drink a fair amount of caffeine ? ?3.  Microscopic hematuria ? ?Plan:  ?1.  He was given instructions on limiting his caffeine/soda intake, drink more water ? ?2.  Drink less fluids in the evening ? ?3.  Given 2 more month samples of Myrbetriq 50 mg ? ?4.  I will see him back in 2 months to recheck symptoms with the above instructions as well as to recheck urine-if persistent hematuria consider cystoscopy ? ?

## 2021-05-02 ENCOUNTER — Ambulatory Visit (INDEPENDENT_AMBULATORY_CARE_PROVIDER_SITE_OTHER): Payer: Medicare Other | Admitting: Urology

## 2021-05-02 ENCOUNTER — Encounter: Payer: Self-pay | Admitting: Urology

## 2021-05-02 VITALS — BP 146/87 | HR 83

## 2021-05-02 DIAGNOSIS — R3915 Urgency of urination: Secondary | ICD-10-CM | POA: Diagnosis not present

## 2021-05-02 DIAGNOSIS — N200 Calculus of kidney: Secondary | ICD-10-CM | POA: Diagnosis not present

## 2021-05-02 DIAGNOSIS — Z8546 Personal history of malignant neoplasm of prostate: Secondary | ICD-10-CM

## 2021-05-02 DIAGNOSIS — R3129 Other microscopic hematuria: Secondary | ICD-10-CM

## 2021-05-02 LAB — MICROSCOPIC EXAMINATION
Bacteria, UA: NONE SEEN
Epithelial Cells (non renal): NONE SEEN /hpf (ref 0–10)
Renal Epithel, UA: NONE SEEN /hpf
WBC, UA: NONE SEEN /hpf (ref 0–5)

## 2021-05-02 LAB — URINALYSIS, ROUTINE W REFLEX MICROSCOPIC
Bilirubin, UA: NEGATIVE
Glucose, UA: NEGATIVE
Ketones, UA: NEGATIVE
Leukocytes,UA: NEGATIVE
Nitrite, UA: NEGATIVE
Specific Gravity, UA: >1.03 — ABNORMAL HIGH (ref 1.005–1.030)
Urobilinogen, Ur: 0.2 mg/dL (ref 0.2–1.0)
pH, UA: 5.5 (ref 5.0–7.5)

## 2021-05-10 DIAGNOSIS — E559 Vitamin D deficiency, unspecified: Secondary | ICD-10-CM | POA: Diagnosis not present

## 2021-05-15 DIAGNOSIS — Z20822 Contact with and (suspected) exposure to covid-19: Secondary | ICD-10-CM | POA: Diagnosis not present

## 2021-05-16 NOTE — Progress Notes (Signed)
Note made in error

## 2021-05-25 DIAGNOSIS — Z20822 Contact with and (suspected) exposure to covid-19: Secondary | ICD-10-CM | POA: Diagnosis not present

## 2021-05-27 DIAGNOSIS — Z20822 Contact with and (suspected) exposure to covid-19: Secondary | ICD-10-CM | POA: Diagnosis not present

## 2021-05-29 DIAGNOSIS — Z20822 Contact with and (suspected) exposure to covid-19: Secondary | ICD-10-CM | POA: Diagnosis not present

## 2021-05-31 DIAGNOSIS — Z20822 Contact with and (suspected) exposure to covid-19: Secondary | ICD-10-CM | POA: Diagnosis not present

## 2021-06-05 DIAGNOSIS — Z20822 Contact with and (suspected) exposure to covid-19: Secondary | ICD-10-CM | POA: Diagnosis not present

## 2021-06-06 DIAGNOSIS — Z20822 Contact with and (suspected) exposure to covid-19: Secondary | ICD-10-CM | POA: Diagnosis not present

## 2021-06-27 NOTE — Progress Notes (Signed)
CARDIOLOGY CONSULT NOTE       Patient ID: James Dyer MRN: [id number] DOB/AGE: 1945-10-08 76 y.o.  Admit date: (Not on file) Referring Physician: Hilma Dyer Primary Physician: James Sites, MD Primary Cardiologist: James Dyer Reason for Consultation: Afib   HPI: 76 y.o. with history of PAF ( CHADVASC 3 ) , HTN, HLD,DM-2, GERD, Dysphagia and chronic neuromuscular Sx bilateral foot drop. No known CAD non ischemic myovue 2015. Found to be in asymptomatic afib 12/22/19 Started on lopressor and eliquis TTE 12/28/19 with EF 50-55% LA 48 mm no significant valve disease Patient deferred [facility]. Has chronic bilateral shoulder injuries with  Previous surgery on left and cortisone shot on right  [facility] with Dr James Dyer 02/28/21 with conversion BP noted to be high even sedated Had ERAF with afib during office f/u 03/08/21  Long discussion about AAT and lifestyle modifications He needs to lose weight and f/u with Dr James Dyer for sleep study  Not currently interested in AAT or repeat conversion   Son with him today Showed me pictures of patients beautiful James Dyer with black/white paint    ROS All other systems reviewed and negative except as noted above  Past Medical History:  Diagnosis Date   Arthritis    HNP- lumbar   Borderline diabetes    Cancer ([place]) 2013   prostate , treated /w radiation    Diabetes mellitus without complication ([place])    Foot drop, bilateral since birthpt wears braces in shoes   pt must have braces to walk   Hepatitis    "pt told had been exposed to hepatitis when he went to donate blood"   History of kidney stones    History of radiation therapy 04/12/11 - 06/06/11   prostate, seminal vesicles   History of stress test    Echocardiogram done - 03/2013   Hypercholesterolemia    states Dr. Armandina Dyer Rx Welchol- pt. hs stopped taking because he believe it gives him  leg cramps.   Hypertension    Nephrolithiasis    renal calculi- , also has been told that he has a cyst on  kidney - R side    Neuromuscular disorder ([place])    bilateral foot drop- both feet     History reviewed. No pertinent family history.  Social History   Socioeconomic History   Marital status: Married    Spouse name: Not on file   Number of children: Not on file   Years of education: Not on file   Highest education level: Not on file  Occupational History   Not on file  Tobacco Use   Smoking status: Former    Packs/day: 1.00    Years: 5.00    Pack years: 5.00    Types: Cigarettes    Quit date: 12/18/1966    Years since quitting: 54.5   Smokeless tobacco: Never  Vaping Use   Vaping Use: Never used  Substance and Sexual Activity   Alcohol use: Yes    Comment: occasional beer   Drug use: No   Sexual activity: Never  Other Topics Concern   Not on file  Social History Narrative   Not on file   Social Determinants of Health   Financial Resource Strain: Not on file  Food Insecurity: Not on file  Transportation Needs: Not on file  Physical Activity: Not on file  Stress: Not on file  Social Connections: Not on file  Intimate Partner Violence: Not on file    Past Surgical History:  Procedure Laterality  Date   BACK SURGERY     CARDIOVERSION N/A 02/28/2021   Procedure: CARDIOVERSION;  Surgeon: Sanda Klein, MD;  Location: [place] [facility];  Service: Cardiovascular;  Laterality: N/A;   COLONOSCOPY N/A 07/22/2012   Procedure: COLONOSCOPY;  Surgeon: [name], MD;  [facility];  Service: Gastroenterology;  Laterality: N/A;   ESOPHAGEAL DILATION N/A 04/22/2019   Procedure: ESOPHAGEAL DILATION;  Surgeon: Rogene Houston, MD;  [facility];  Service: Endoscopy;  Laterality: N/A;   ESOPHAGOGASTRODUODENOSCOPY N/A 04/22/2019   Procedure: ESOPHAGOGASTRODUODENOSCOPY (EGD);  Surgeon: Rogene Houston, MD;  [facility];  Service: Endoscopy;  Laterality: N/A;  145   EXTRACORPOREAL SHOCK WAVE LITHOTRIPSY Left 03/04/2017   Procedure: LEFT EXTRACORPOREAL  SHOCK WAVE LITHOTRIPSY (ESWL);  Surgeon: [name], MD;  [facility];  Service: Urology;  Laterality: Left;   FOOT SURGERY Right 1980's   calcium deposit removed   LUMBAR LAMINECTOMY/DECOMPRESSION MICRODISCECTOMY Right 09/01/2013   Procedure: RIGHT LUMBAR FOUR-FIVE LAMINECTOMY/DECOMPRESSION MICRODISCECTOMY 1 LEVEL;  Surgeon: [name], MD;  Location: [place] [facility];  Service: Neurosurgery;  Laterality: Right;  Right L45 microdiskectomy   prostate biopsy  2013   SHOULDER OPEN ROTATOR CUFF REPAIR Left 09/09/2012   Procedure: LEFT ROTATOR CUFF REPAIR SHOULDER OPEN;  Surgeon: [name], MD;  [facility];  Service: Orthopedics;  Laterality: Left;      Current Outpatient Medications:    amLODipine (NORVASC) 2.5 MG tablet, Take 1 tablet (2.5 mg total) by mouth daily., Disp: 30 tablet, Rfl: 11   apixaban (ELIQUIS) 5 MG TABS tablet, TAKE 1 TABLET(5 MG) BY MOUTH TWICE DAILY, Disp: 60 tablet, Rfl: 5   cholecalciferol (VITAMIN D) 25 MCG (1000 UNIT) tablet, Take 1,000 Units by mouth daily with lunch., Disp: , Rfl:    fluticasone (FLONASE) 50 MCG/ACT nasal spray, Place 2 sprays into both nostrils daily as needed for allergies or rhinitis., Disp: , Rfl:    Lidocaine HCl (PAIN RELIEF ROLL-ON EX), Apply 1 application topically daily as needed (applied to feet if needed for pain.)., Disp: , Rfl:    Lidocaine HCl 4 % CREA, Apply 1 application topically daily., Disp: , Rfl:    losartan (COZAAR) 100 MG tablet, Take 100 mg by mouth daily., Disp: , Rfl:    meclizine (ANTIVERT) 25 MG tablet, Take 25 mg by mouth 2 (two) times daily as needed for dizziness., Disp: , Rfl:    metoprolol tartrate (LOPRESSOR) 25 MG tablet, TAKE 1 TABLET(25 MG) BY MOUTH TWICE DAILY, Disp: 180 tablet, Rfl: 1   mirabegron ER (MYRBETRIQ) 50 MG TB24 tablet, Take 50 mg by mouth daily., Disp: , Rfl:    Multiple Vitamin (MULTIVITAMIN WITH MINERALS) TABS tablet, Take 1 tablet by mouth daily., Disp: , Rfl:     phenylephrine (SUDAFED PE) 10 MG TABS tablet, Take 10 mg by mouth every 4 (four) hours as needed (congestion)., Disp: , Rfl:    triamcinolone cream (KENALOG) 0.1 %, Apply 1 application topically 2 (two) times daily. (Patient taking differently: Apply 1 application. topically 2 (two) times daily as needed (irritation).), Disp: 454 g, Rfl: 0   Turmeric 500 MG CAPS, Take 500 mg by mouth daily., Disp: , Rfl:    vitamin B-12 (CYANOCOBALAMIN) 1000 MCG tablet, Take 1,000 mcg by mouth every evening. , Disp: , Rfl:     Physical Exam: Blood pressure (!) 144/90, pulse 82, height '5\' 9"'$  (1.753 m), weight 260 lb (117.9 kg), SpO2 96 %.    Affect  appropriate Healthy:  appears stated age 72: normal Neck supple with no adenopathy JVP normal no bruits no thyromegaly Lungs clear with no wheezing and good diaphragmatic motion Heart:  S1/S2 no murmur, no rub, gallop or click PMI normal Abdomen: benighn, BS positve, no tenderness, no AAA no bruit.  No HSM or HJR Distal pulses intact with no bruits No edema Bilateral foot drop with braces  Skin warm and dry Bilateral leg splints from foot drop    Labs:   Lab Results  Component Value Date   WBC 7.9 02/21/2021   HGB 15.2 02/21/2021   HCT 44.7 02/21/2021   MCV 89.4 02/21/2021   PLT 202 02/21/2021   No results for input(s): NA, K, CL, CO2, BUN, CREATININE, CALCIUM, PROT, BILITOT, ALKPHOS, ALT, AST, GLUCOSE in the last 168 hours.  Invalid input(s): LABALBU Lab Results  Component Value Date   TROPONINI <0.30 03/14/2012   No results found for: CHOL No results found for: HDL No results found for: LDLCALC No results found for: TRIG No results found for: CHOLHDL No results found for: LDLDIRECT    Radiology: No results found.  EKG: SR PVC rate 74 otherwise normal 2013   ASSESSMENT AND PLAN:   1. Afib:  Continue eliquis and lopressor rate control and anticoagulation Weight loss and sleep study per primary ERAF post [facility] 02/28/21 patient  deferred AAT or repeat [facility] Discussed looking into Pradaxa for less cost as it is generic 2. DM:  Discussed low carb diet.  Target hemoglobin A1c is 6.5 or less.  Continue current medications. 3.HTN:  Increase Norvasc 5 mg daily  low sodium Dash type diet.   4. Dysphagia:  Normal EGD April 22 2019 f/u GI  5. Otho:  Encouraged him to get MRI and see ortho as he is getting a frozen shoulder  6. Dizzy/Malaise:  Not clear that any of his symptoms related to his heart On blood thinner Hct Was normal 05/04/20  7. Urology:  f/u with Dr Eulogio Ditch Incontinence worse Ditropan not effective Started on Myrbetriq  post TURP for prostate cancer with IMRT   Increase Norvasc 5 mg   F/u in a year   Signed: Jenkins Rouge 07/04/2021, 9:04 AM

## 2021-07-04 ENCOUNTER — Ambulatory Visit (INDEPENDENT_AMBULATORY_CARE_PROVIDER_SITE_OTHER): Payer: Medicare Other | Admitting: Cardiovascular Disease

## 2021-07-04 ENCOUNTER — Encounter: Payer: Self-pay | Admitting: Cardiovascular Disease

## 2021-07-04 VITALS — BP 144/90 | HR 82 | Ht 69.0 in | Wt 260.0 lb

## 2021-07-04 DIAGNOSIS — I10 Essential (primary) hypertension: Secondary | ICD-10-CM | POA: Diagnosis not present

## 2021-07-04 DIAGNOSIS — I4819 Other persistent atrial fibrillation: Secondary | ICD-10-CM

## 2021-07-04 DIAGNOSIS — Z7901 Long term (current) use of anticoagulants: Secondary | ICD-10-CM

## 2021-07-04 MED ORDER — AMLODIPINE BESYLATE 5 MG PO TABS: 5.0000 mg | ORAL_TABLET | Freq: Every day | ORAL | 3 refills | Status: DC

## 2021-07-04 NOTE — Patient Instructions (Signed)
Medication Instructions:   Increase Norvasc to 5 mg Daily   Look into Pradaxa   *If you need a refill on your cardiac medications before your next appointment, please call your pharmacy*   Lab Work: NONE  If you have labs (blood work) drawn today and your tests are completely normal, you will receive your results only by: [place] (if you have MyChart) OR A paper copy in the mail If you have any lab test that is abnormal or we need to change your treatment, we will call you to review the results.   Testing/Procedures: NONE    Follow-Up: At [facility], you and your health needs are our priority.  As part of our continuing mission to provide you with exceptional heart care, we have created designated Provider Care Teams.  These Care Teams include your primary Cardiologist (physician) and Advanced Practice Providers (APPs -  Physician Assistants and Nurse Practitioners) who all work together to provide you with the care you need, when you need it.  We recommend signing up for the patient portal called "MyChart".  Sign up information is provided on this After Visit Summary.  MyChart is used to connect with patients for Virtual Visits (Telemedicine).  Patients are able to view lab/test results, encounter notes, upcoming appointments, etc.  Non-urgent messages can be sent to your provider as well.   To learn more about what you can do with MyChart, go to [email or web address].    Your next appointment:   1 year(s)  The format for your next appointment:   In Person  Provider:   Jenkins Rouge, MD    Other Instructions Thank you for choosing [facility]!    Important Information About Sugar

## 2021-07-10 NOTE — Progress Notes (Signed)
History of Present Illness: James Dyer is here today for routine followup.    1.  History of prostate cancer.  10.25.2012: TRUS/Bx. Prostate volume 38 mL, PSA density 0.10.  1/12 cores revealed GS 3+4 pattern.  He completed IMRT on 5.8.2013. Most recent PSA 1.3.2023--<0.1.   2.  History of urolithiasis.  Had lithotripsy for a left upper ureteral stone on 03/04/2017.  No symptoms of stone since then   3.  Overactive bladder symptoms.  At his visit here in January, 2023 he was given Myrbetriq and taken off of oxybutynin 15 mg.  He still have significant lower urinary tract symptoms-frequency, urgency, occasional urgency incontinence, nocturia.  He drinks 5-6 caffeinated Sun drops a day.  6.13.2023: Here for follow-up.  At his last visit he was given more Myrbetriq samples as well as instructed on dietary management of OAB.  He did have microscopic hematuria at that time.  He is still drinking quite a few Sun drops.  He stopped the Myrbetriq about a week ago.  Urinary symptoms have not gotten that bad since then. Past Medical History:  Diagnosis Date   Arthritis    HNP- lumbar   Borderline diabetes    Cancer ([place]) 2013   prostate , treated /w radiation    Diabetes mellitus without complication ([place])    Foot drop, bilateral since birthpt wears braces in shoes   pt must have braces to walk   Hepatitis    "pt told had been exposed to hepatitis when he went to donate blood"   History of kidney stones    History of radiation therapy 04/12/11 - 06/06/11   prostate, seminal vesicles   History of stress test    Echocardiogram done - 03/2013   Hypercholesterolemia    states Dr. Armandina Gemma Rx Welchol- pt. hs stopped taking because he believe it gives him  leg cramps.   Hypertension    Nephrolithiasis    renal calculi- , also has been told that he has a cyst on kidney - R side    Neuromuscular disorder ([place])    bilateral foot drop- both feet     Past Surgical History:  Procedure Laterality Date    BACK SURGERY     CARDIOVERSION N/A 02/28/2021   Procedure: CARDIOVERSION;  Surgeon: Sanda Klein, MD;  Location: [place] END[facility];  Service: Cardiovascular;  Laterality: N/A;   COLONOSCOPY N/A 07/22/2012   Procedure: COLONOSCOPY;  Surgeon: [name], MD;  [facility];  Service: Gastroenterology;  Laterality: N/A;   ESOPHAGEAL DILATION N/A 04/22/2019   Procedure: ESOPHAGEAL DILATION;  Surgeon: Rogene Houston, MD;  [facility];  Service: Endoscopy;  Laterality: N/A;   ESOPHAGOGASTRODUODENOSCOPY N/A 04/22/2019   Procedure: ESOPHAGOGASTRODUODENOSCOPY (EGD);  Surgeon: Rogene Houston, MD;  [facility];  Service: Endoscopy;  Laterality: N/A;  145   EXTRACORPOREAL SHOCK WAVE LITHOTRIPSY Left 03/04/2017   Procedure: LEFT EXTRACORPOREAL SHOCK WAVE LITHOTRIPSY (ESWL);  Surgeon: [name], MD;  [facility];  Service: Urology;  Laterality: Left;   FOOT SURGERY Right 1980's   calcium deposit removed   LUMBAR LAMINECTOMY/DECOMPRESSION MICRODISCECTOMY Right 09/01/2013   Procedure: RIGHT LUMBAR FOUR-FIVE LAMINECTOMY/DECOMPRESSION MICRODISCECTOMY 1 LEVEL;  Surgeon: [name], MD;  Location: [place] [facility];  Service: Neurosurgery;  Laterality: Right;  Right L45 microdiskectomy   prostate biopsy  2013   SHOULDER OPEN ROTATOR CUFF REPAIR Left 09/09/2012   Procedure: LEFT ROTATOR CUFF REPAIR SHOULDER OPEN;  Surgeon: [name], MD;  [facility];  Service: Orthopedics;  Laterality: Left;    Home Medications:  Allergies as of 07/11/2021       Reactions   Latex Other (See Comments)   "sores in mouth after teeth cleaned"        Medication List        Accurate as of July 10, 2021  7:40 PM. If you have any questions, ask your nurse or doctor.          amLODipine 5 MG tablet Commonly known as: NORVASC Take 1 tablet (5 mg total) by mouth daily.   cholecalciferol 25 MCG (1000 UNIT) tablet Commonly known as: VITAMIN D Take 1,000 Units  by mouth daily with lunch.   Eliquis 5 MG Tabs tablet Generic drug: apixaban TAKE 1 TABLET(5 MG) BY MOUTH TWICE DAILY   fluticasone 50 MCG/ACT nasal spray Commonly known as: FLONASE Place 2 sprays into both nostrils daily as needed for allergies or rhinitis.   losartan 100 MG tablet Commonly known as: COZAAR Take 100 mg by mouth daily.   meclizine 25 MG tablet Commonly known as: ANTIVERT Take 25 mg by mouth 2 (two) times daily as needed for dizziness.   metoprolol tartrate 25 MG tablet Commonly known as: LOPRESSOR TAKE 1 TABLET(25 MG) BY MOUTH TWICE DAILY   mirabegron ER 50 MG Tb24 tablet Commonly known as: MYRBETRIQ Take 50 mg by mouth daily.   multivitamin with minerals Tabs tablet Take 1 tablet by mouth daily.   PAIN RELIEF ROLL-ON EX Apply 1 application topically daily as needed (applied to feet if needed for pain.).   Lidocaine HCl 4 % Crea Apply 1 application topically daily.   phenylephrine 10 MG Tabs tablet Commonly known as: SUDAFED PE Take 10 mg by mouth every 4 (four) hours as needed (congestion).   triamcinolone cream 0.1 % Commonly known as: KENALOG Apply 1 application topically 2 (two) times daily. What changed:  when to take this reasons to take this   Turmeric 500 MG Caps Take 500 mg by mouth daily.   vitamin B-12 1000 MCG tablet Commonly known as: CYANOCOBALAMIN Take 1,000 mcg by mouth every evening.        Allergies:  Allergies  Allergen Reactions   Latex Other (See Comments)    "sores in mouth after teeth cleaned"    No family history on file.  Social History:  reports that he quit smoking about 54 years ago. His smoking use included cigarettes. He has a 5.00 pack-year smoking history. He has never used smokeless tobacco. He reports current alcohol use. He reports that he does not use drugs.  ROS: A complete review of systems was performed.  All systems are negative except for pertinent findings as noted.  Physical Exam:   Vital signs in last 24 hours: There were no vitals taken for this visit. Constitutional:  Alert and oriented, No acute distress Cardiovascular: Regular rate  Respiratory: Normal respiratory effort Lymphatic: No lymphadenopathy Neurologic: Grossly intact, no focal deficits Psychiatric: Normal mood and affect  I have reviewed prior pt notes  I have reviewed urinalysis results--no evidence of hematuria  I have independently reviewed IPSS results-score 15, quality-of-life score 6.  I have reviewed prior PSA results  Impression/Assessment:  History of prostate cancer, 10 years out from radiotherapy without evidence of recurrence  Overactive bladder symptoms, no doubt exacerbated by heavy soda intake.  Not that worse off of Myrbetriq  History of stone disease, no evidence of stones currently  Plan:  1.  I did discuss with he and his son that he should limit his sodium intake and increase water intake  2.  I sent a prescription for Solifenacin 10 mg and showed his symptoms get worse being off of the Myrbetriq  3.  I will see back in 6 months following PSA

## 2021-07-11 ENCOUNTER — Encounter: Payer: Self-pay | Admitting: Urology

## 2021-07-11 ENCOUNTER — Ambulatory Visit (INDEPENDENT_AMBULATORY_CARE_PROVIDER_SITE_OTHER): Payer: Medicare Other | Admitting: Urology

## 2021-07-11 VITALS — BP 162/91 | HR 79

## 2021-07-11 DIAGNOSIS — Z87442 Personal history of urinary calculi: Secondary | ICD-10-CM

## 2021-07-11 DIAGNOSIS — Z8546 Personal history of malignant neoplasm of prostate: Secondary | ICD-10-CM | POA: Diagnosis not present

## 2021-07-11 DIAGNOSIS — R350 Frequency of micturition: Secondary | ICD-10-CM

## 2021-07-11 DIAGNOSIS — R3129 Other microscopic hematuria: Secondary | ICD-10-CM | POA: Diagnosis not present

## 2021-07-11 MED ORDER — SOLIFENACIN SUCCINATE 10 MG PO TABS: 10.0000 mg | ORAL_TABLET | Freq: Every day | ORAL | 11 refills | Status: DC

## 2021-07-12 LAB — MICROSCOPIC EXAMINATION: Bacteria, UA: NONE SEEN

## 2021-07-12 LAB — URINALYSIS, ROUTINE W REFLEX MICROSCOPIC
Bilirubin, UA: NEGATIVE
Glucose, UA: NEGATIVE
Ketones, UA: NEGATIVE
Leukocytes,UA: NEGATIVE
Nitrite, UA: NEGATIVE
Specific Gravity, UA: 1.025 (ref 1.005–1.030)
Urobilinogen, Ur: 0.2 mg/dL (ref 0.2–1.0)
pH, UA: 5 (ref 5.0–7.5)

## 2021-07-15 ENCOUNTER — Other Ambulatory Visit: Payer: Self-pay | Admitting: Cardiovascular Disease

## 2021-07-19 ENCOUNTER — Telehealth: Payer: Self-pay

## 2021-07-19 NOTE — Telephone Encounter (Signed)
Patient left a voice message 07-18-21:  Patient is needing to discuss medications (medications are mixed up) he is taking. Said  Please advise.  Call back: [phone]  Thanks, Helene Kelp

## 2021-07-20 DIAGNOSIS — M79672 Pain in left foot: Secondary | ICD-10-CM | POA: Diagnosis not present

## 2021-07-20 DIAGNOSIS — M25572 Pain in left ankle and joints of left foot: Secondary | ICD-10-CM | POA: Diagnosis not present

## 2021-07-20 DIAGNOSIS — M21372 Foot drop, left foot: Secondary | ICD-10-CM | POA: Diagnosis not present

## 2021-07-20 DIAGNOSIS — M21371 Foot drop, right foot: Secondary | ICD-10-CM | POA: Insufficient documentation

## 2021-07-20 DIAGNOSIS — S93402A Sprain of unspecified ligament of left ankle, initial encounter: Secondary | ICD-10-CM | POA: Diagnosis not present

## 2021-07-21 ENCOUNTER — Telehealth: Payer: Self-pay | Admitting: Cardiovascular Disease

## 2021-07-21 MED ORDER — METOPROLOL TARTRATE 25 MG PO TABS: ORAL_TABLET | ORAL | 1 refills | Status: DC

## 2021-07-21 NOTE — Telephone Encounter (Signed)
Refill complete 

## 2021-07-24 ENCOUNTER — Other Ambulatory Visit: Payer: Self-pay | Admitting: Cardiovascular Disease

## 2021-08-10 DIAGNOSIS — S93402A Sprain of unspecified ligament of left ankle, initial encounter: Secondary | ICD-10-CM | POA: Diagnosis not present

## 2021-08-24 DIAGNOSIS — E119 Type 2 diabetes mellitus without complications: Secondary | ICD-10-CM | POA: Diagnosis not present

## 2021-08-24 DIAGNOSIS — H25813 Combined forms of age-related cataract, bilateral: Secondary | ICD-10-CM | POA: Diagnosis not present

## 2021-08-24 DIAGNOSIS — H01001 Unspecified blepharitis right upper eyelid: Secondary | ICD-10-CM | POA: Diagnosis not present

## 2021-08-24 DIAGNOSIS — H01002 Unspecified blepharitis right lower eyelid: Secondary | ICD-10-CM | POA: Diagnosis not present

## 2021-09-11 DIAGNOSIS — M21371 Foot drop, right foot: Secondary | ICD-10-CM | POA: Diagnosis not present

## 2021-09-11 DIAGNOSIS — M21372 Foot drop, left foot: Secondary | ICD-10-CM | POA: Diagnosis not present

## 2021-09-11 DIAGNOSIS — S93402A Sprain of unspecified ligament of left ankle, initial encounter: Secondary | ICD-10-CM | POA: Diagnosis not present

## 2021-09-25 ENCOUNTER — Other Ambulatory Visit (HOSPITAL_COMMUNITY): Payer: Self-pay | Admitting: Family Medicine

## 2021-09-25 ENCOUNTER — Ambulatory Visit (HOSPITAL_COMMUNITY)
Admission: RE | Admit: 2021-09-25 | Discharge: 2021-09-25 | Disposition: A | Discharge status: Home / Self Care | Payer: Medicare Other | Source: Ambulatory Visit | Attending: Family Medicine | Admitting: Family Medicine

## 2021-09-25 ENCOUNTER — Other Ambulatory Visit: Payer: Self-pay | Admitting: Family Medicine

## 2021-09-25 DIAGNOSIS — M21379 Foot drop, unspecified foot: Secondary | ICD-10-CM | POA: Diagnosis not present

## 2021-09-25 DIAGNOSIS — S0990XA Unspecified injury of head, initial encounter: Secondary | ICD-10-CM | POA: Insufficient documentation

## 2021-09-25 DIAGNOSIS — I10 Essential (primary) hypertension: Secondary | ICD-10-CM | POA: Diagnosis not present

## 2021-09-25 DIAGNOSIS — E782 Mixed hyperlipidemia: Secondary | ICD-10-CM | POA: Diagnosis not present

## 2021-09-25 DIAGNOSIS — Z6838 Body mass index (BMI) 38.0-38.9, adult: Secondary | ICD-10-CM | POA: Diagnosis not present

## 2021-09-25 DIAGNOSIS — E538 Deficiency of other specified B group vitamins: Secondary | ICD-10-CM | POA: Diagnosis not present

## 2021-09-25 DIAGNOSIS — E039 Hypothyroidism, unspecified: Secondary | ICD-10-CM | POA: Diagnosis not present

## 2021-09-25 DIAGNOSIS — E6609 Other obesity due to excess calories: Secondary | ICD-10-CM | POA: Diagnosis not present

## 2021-09-25 DIAGNOSIS — E118 Type 2 diabetes mellitus with unspecified complications: Secondary | ICD-10-CM | POA: Diagnosis not present

## 2021-09-25 DIAGNOSIS — S0631AA Contusion and laceration of right cerebrum with loss of consciousness status unknown, initial encounter: Secondary | ICD-10-CM | POA: Diagnosis not present

## 2021-09-25 DIAGNOSIS — I4891 Unspecified atrial fibrillation: Secondary | ICD-10-CM | POA: Diagnosis not present

## 2021-11-21 DIAGNOSIS — E6609 Other obesity due to excess calories: Secondary | ICD-10-CM | POA: Diagnosis not present

## 2021-11-21 DIAGNOSIS — Z1331 Encounter for screening for depression: Secondary | ICD-10-CM | POA: Diagnosis not present

## 2021-11-21 DIAGNOSIS — I4891 Unspecified atrial fibrillation: Secondary | ICD-10-CM | POA: Diagnosis not present

## 2021-11-21 DIAGNOSIS — E538 Deficiency of other specified B group vitamins: Secondary | ICD-10-CM | POA: Diagnosis not present

## 2021-11-21 DIAGNOSIS — I10 Essential (primary) hypertension: Secondary | ICD-10-CM | POA: Diagnosis not present

## 2021-11-21 DIAGNOSIS — E118 Type 2 diabetes mellitus with unspecified complications: Secondary | ICD-10-CM | POA: Diagnosis not present

## 2021-11-21 DIAGNOSIS — E782 Mixed hyperlipidemia: Secondary | ICD-10-CM | POA: Diagnosis not present

## 2021-11-21 DIAGNOSIS — Z0001 Encounter for general adult medical examination with abnormal findings: Secondary | ICD-10-CM | POA: Diagnosis not present

## 2021-11-21 DIAGNOSIS — Z6837 Body mass index (BMI) 37.0-37.9, adult: Secondary | ICD-10-CM | POA: Diagnosis not present

## 2021-11-22 DIAGNOSIS — Z23 Encounter for immunization: Secondary | ICD-10-CM | POA: Diagnosis not present

## 2022-01-08 NOTE — Progress Notes (Incomplete)
History of Present Illness: This man is here today for follow-up of the below listed issues:  1.  History of prostate cancer.  10.25.2012: TRUS/Bx. Prostate volume 38 mL, PSA density 0.10.  1/12 cores revealed GS 3+4 pattern.  He completed IMRT on 5.8.2013. Most recent PSA 1.3.2023--<0.1.   2.  History of urolithiasis.  Had lithotripsy for a left upper ureteral stone on 03/04/2017.  No symptoms of stone since then   3.  Overactive bladder symptoms.  At his visit here in January, 2023 he was given Myrbetriq and taken off of oxybutynin 15 mg.  He still have significant lower urinary tract symptoms-frequency, urgency, occasional urgency incontinence, nocturia.  He drinks 5-6 caffeinated Sun drops a day.  12.12.2023: Still on solifenacin. He does have LUTS--urgency and occasional urgency incontinence.  Still drinks a fair amount of carbonated soft drinks.  He does have a good stream.  He has had no blood in his urine or stool.  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 Armandina Gemma Rx Welchol- pt. hs stopped taking because he believe it gives him  leg cramps.   Hypertension    Nephrolithiasis    renal calculi- , also has been told that he has a cyst on kidney - R side    Neuromuscular disorder ([place])    bilateral foot drop- both feet     Past Surgical History:  Procedure Laterality Date   BACK SURGERY     CARDIOVERSION N/A 02/28/2021   Procedure: CARDIOVERSION;   Surgeon: Sanda Klein, MD;  Location: [facility];  Service: Cardiovascular;  Laterality: N/A;   COLONOSCOPY N/A 07/22/2012   Procedure: COLONOSCOPY;  Surgeon: [name], MD;  Location: [facility];  Service: Gastroenterology;  Laterality: N/A;   ESOPHAGEAL DILATION N/A 04/22/2019   Procedure: ESOPHAGEAL DILATION;  Surgeon: Rogene Houston, MD;  Location: [facility];  Service: Endoscopy;  Laterality: N/A;   ESOPHAGOGASTRODUODENOSCOPY N/A 04/22/2019   Procedure: ESOPHAGOGASTRODUODENOSCOPY (EGD);  Surgeon: Rogene Houston, MD;  Location: [facility];  Service: Endoscopy;  Laterality: N/A;  145   EXTRACORPOREAL SHOCK WAVE LITHOTRIPSY Left 03/04/2017   Procedure: LEFT EXTRACORPOREAL SHOCK WAVE LITHOTRIPSY (ESWL);  Surgeon: [name], MD;  Location: [facility];  Service: Urology;  Laterality: Left;   FOOT SURGERY Right 1980's   calcium deposit removed   LUMBAR LAMINECTOMY/DECOMPRESSION MICRODISCECTOMY Right 09/01/2013   Procedure: RIGHT LUMBAR FOUR-FIVE LAMINECTOMY/DECOMPRESSION MICRODISCECTOMY 1 LEVEL;  Surgeon: [name], MD;  Location: [facility];  Service: Neurosurgery;  Laterality: Right;  Right L45 microdiskectomy   prostate biopsy  2013   SHOULDER OPEN ROTATOR CUFF REPAIR Left 09/09/2012   Procedure: LEFT ROTATOR CUFF REPAIR SHOULDER OPEN;  Surgeon: [name], MD;  Location: [facility];  Service: Orthopedics;  Laterality: Left;    Home Medications:  Allergies as of 01/09/2022       Reactions   Latex Other (See Comments)   "sores in mouth after teeth cleaned"        Medication List        Accurate as of January 08, 2022  8:28 AM. If you have any questions, ask your nurse or doctor.          amLODipine 5 MG tablet Commonly known as: NORVASC Take 1 tablet (5 mg total) by mouth daily.   cholecalciferol 25 MCG (1000 UNIT) tablet Commonly known as: VITAMIN D3 Take 1,000 Units by mouth daily with lunch.   cyanocobalamin 1000 MCG tablet Commonly  known as: VITAMIN B12 Take 1,000 mcg by mouth every evening.   Eliquis 5 MG Tabs tablet Generic drug: apixaban TAKE 1 TABLET(5 MG) BY MOUTH TWICE DAILY   fluticasone 50 MCG/ACT nasal spray Commonly known as: FLONASE Place 2 sprays into both nostrils daily as needed for allergies or rhinitis.   losartan 100 MG tablet Commonly known as: COZAAR Take 100 mg by mouth daily.   meclizine 25 MG tablet Commonly known as: ANTIVERT Take 25 mg by mouth 2 (two) times daily as needed for dizziness.   metoprolol tartrate 25 MG tablet Commonly known as: LOPRESSOR TAKE 1 TABLET(25 MG) BY MOUTH TWICE DAILY   multivitamin with minerals Tabs tablet Take 1 tablet by mouth daily.   PAIN RELIEF ROLL-ON EX Apply 1 application topically daily as needed (applied to feet if needed for pain.).   Lidocaine HCl 4 % Crea Apply 1 application topically daily.   phenylephrine 10 MG Tabs tablet Commonly known as: SUDAFED PE Take 10 mg by mouth every 4 (four) hours as needed (congestion).   solifenacin 10 MG tablet Commonly known as: VESICARE Take 1 tablet (10 mg total) by mouth daily.   triamcinolone cream 0.1 % Commonly known as: KENALOG Apply 1 application topically 2 (two) times daily. What changed:  when to take this reasons to take this   Turmeric 500 MG Caps Take 500 mg by mouth daily.        Allergies:  Allergies  Allergen Reactions   Latex Other (See Comments)    "sores in mouth after teeth cleaned"    No family history on file.  Social History:  reports that he quit smoking about 55 years ago. His smoking use included cigarettes. He has a 5.00 pack-year smoking history. He has never used smokeless tobacco. He reports current alcohol use. He reports that he does not use drugs.  ROS: A complete review of systems was performed.  All systems are negative except for pertinent findings as noted.  Physical Exam:  Vital signs in last 24 hours: There were no vitals taken for this  visit. Constitutional:  Alert and oriented, No acute distress Cardiovascular: Regular rate  Respiratory: Normal respiratory effort Neurologic: Grossly intact, no focal deficits Psychiatric: Normal mood and affect  I have reviewed prior pt notes  I have reviewed notes from referring/previous physicians  I have independently reviewed prior imaging--U/S volume  I have reviewed prior PSA results  IPSS form reviewed    Impression/Assessment:  1.  History of prostate cancer, grade group 2.  He completed IMRT over 10 years ago.  Excellent PSA response, last PSA just about a year ago  2.  Lower urinary tract symptoms-overactive bladder.  On Solifenacin with tolerable symptoms but still occasional urgency and urgency incontinence  3.  History of urolithiasis.  No recent symptoms  Plan:  1.  I will check his PSA today  2.  Overactive bladder guidance sheet given  3.  I will see back in 1 year for recheck

## 2022-01-09 ENCOUNTER — Ambulatory Visit (INDEPENDENT_AMBULATORY_CARE_PROVIDER_SITE_OTHER): Payer: Medicare Other | Admitting: Urology

## 2022-01-09 VITALS — BP 114/83 | HR 96

## 2022-01-09 DIAGNOSIS — R350 Frequency of micturition: Secondary | ICD-10-CM | POA: Diagnosis not present

## 2022-01-09 DIAGNOSIS — Z8546 Personal history of malignant neoplasm of prostate: Secondary | ICD-10-CM

## 2022-01-09 DIAGNOSIS — R3129 Other microscopic hematuria: Secondary | ICD-10-CM

## 2022-01-09 DIAGNOSIS — R3915 Urgency of urination: Secondary | ICD-10-CM | POA: Diagnosis not present

## 2022-01-10 LAB — PSA: Prostate Specific Ag, Serum: <0.1 ng/mL (ref 0.0–4.0)

## 2022-01-16 ENCOUNTER — Telehealth: Payer: Self-pay

## 2022-01-16 NOTE — Telephone Encounter (Signed)
Unable to reach patient by phone, left voicemail requesting call back for results.

## 2022-01-16 NOTE — Telephone Encounter (Signed)
-----   Message from Franchot Gallo, MD sent at 01/16/2022 12:57 PM EST ----- Patient did not see PSA results in [place] him PSA is still undetectable.

## 2022-02-01 NOTE — Progress Notes (Signed)
Opened in error

## 2022-02-05 ENCOUNTER — Other Ambulatory Visit: Payer: Self-pay | Admitting: Cardiovascular Disease

## 2022-02-05 NOTE — Telephone Encounter (Signed)
Prescription refill request for Eliquis received. Indication:afib Last office visit:6/23 Scr:1.1 Age: 77 Weight:117.9  kg  Prescription refilled

## 2022-05-12 ENCOUNTER — Emergency Department (HOSPITAL_COMMUNITY): Payer: Medicare Other

## 2022-05-12 ENCOUNTER — Encounter (HOSPITAL_COMMUNITY): Payer: Self-pay

## 2022-05-12 ENCOUNTER — Other Ambulatory Visit: Payer: Self-pay

## 2022-05-12 ENCOUNTER — Inpatient Hospital Stay (HOSPITAL_COMMUNITY)
Admission: EM | Admit: 2022-05-12 | Discharge: 2022-05-22 | DRG: 682 | Disposition: A | Discharge status: Skilled Nursing Facility | Payer: Medicare Other | Attending: Internal Medicine | Admitting: Internal Medicine

## 2022-05-12 DIAGNOSIS — N3281 Overactive bladder: Secondary | ICD-10-CM | POA: Diagnosis not present

## 2022-05-12 DIAGNOSIS — N281 Cyst of kidney, acquired: Secondary | ICD-10-CM | POA: Diagnosis not present

## 2022-05-12 DIAGNOSIS — E11649 Type 2 diabetes mellitus with hypoglycemia without coma: Secondary | ICD-10-CM | POA: Insufficient documentation

## 2022-05-12 DIAGNOSIS — G319 Degenerative disease of nervous system, unspecified: Secondary | ICD-10-CM | POA: Diagnosis not present

## 2022-05-12 DIAGNOSIS — N390 Urinary tract infection, site not specified: Secondary | ICD-10-CM | POA: Diagnosis present

## 2022-05-12 DIAGNOSIS — Z87442 Personal history of urinary calculi: Secondary | ICD-10-CM

## 2022-05-12 DIAGNOSIS — F03918 Unspecified dementia, unspecified severity, with other behavioral disturbance: Secondary | ICD-10-CM | POA: Diagnosis present

## 2022-05-12 DIAGNOSIS — R262 Difficulty in walking, not elsewhere classified: Secondary | ICD-10-CM | POA: Diagnosis not present

## 2022-05-12 DIAGNOSIS — M6282 Rhabdomyolysis: Secondary | ICD-10-CM | POA: Diagnosis present

## 2022-05-12 DIAGNOSIS — E861 Hypovolemia: Secondary | ICD-10-CM | POA: Diagnosis not present

## 2022-05-12 DIAGNOSIS — R338 Other retention of urine: Secondary | ICD-10-CM | POA: Diagnosis not present

## 2022-05-12 DIAGNOSIS — Z8546 Personal history of malignant neoplasm of prostate: Secondary | ICD-10-CM

## 2022-05-12 DIAGNOSIS — R509 Fever, unspecified: Secondary | ICD-10-CM

## 2022-05-12 DIAGNOSIS — Z91040 Latex allergy status: Secondary | ICD-10-CM

## 2022-05-12 DIAGNOSIS — I7121 Aneurysm of the ascending aorta, without rupture: Secondary | ICD-10-CM | POA: Diagnosis present

## 2022-05-12 DIAGNOSIS — E7800 Pure hypercholesterolemia, unspecified: Secondary | ICD-10-CM | POA: Diagnosis present

## 2022-05-12 DIAGNOSIS — E785 Hyperlipidemia, unspecified: Secondary | ICD-10-CM | POA: Diagnosis not present

## 2022-05-12 DIAGNOSIS — S0990XA Unspecified injury of head, initial encounter: Secondary | ICD-10-CM | POA: Diagnosis present

## 2022-05-12 DIAGNOSIS — M21371 Foot drop, right foot: Secondary | ICD-10-CM | POA: Diagnosis present

## 2022-05-12 DIAGNOSIS — W19XXXA Unspecified fall, initial encounter: Secondary | ICD-10-CM | POA: Diagnosis not present

## 2022-05-12 DIAGNOSIS — S0010XA Contusion of unspecified eyelid and periocular area, initial encounter: Secondary | ICD-10-CM | POA: Diagnosis present

## 2022-05-12 DIAGNOSIS — R296 Repeated falls: Secondary | ICD-10-CM | POA: Diagnosis present

## 2022-05-12 DIAGNOSIS — M21372 Foot drop, left foot: Secondary | ICD-10-CM | POA: Diagnosis present

## 2022-05-12 DIAGNOSIS — D6959 Other secondary thrombocytopenia: Secondary | ICD-10-CM | POA: Diagnosis present

## 2022-05-12 DIAGNOSIS — E669 Obesity, unspecified: Secondary | ICD-10-CM | POA: Diagnosis present

## 2022-05-12 DIAGNOSIS — E162 Hypoglycemia, unspecified: Secondary | ICD-10-CM | POA: Diagnosis not present

## 2022-05-12 DIAGNOSIS — R319 Hematuria, unspecified: Secondary | ICD-10-CM | POA: Diagnosis not present

## 2022-05-12 DIAGNOSIS — B9620 Unspecified Escherichia coli [E. coli] as the cause of diseases classified elsewhere: Secondary | ICD-10-CM | POA: Diagnosis present

## 2022-05-12 DIAGNOSIS — N400 Enlarged prostate without lower urinary tract symptoms: Secondary | ICD-10-CM | POA: Diagnosis present

## 2022-05-12 DIAGNOSIS — D696 Thrombocytopenia, unspecified: Secondary | ICD-10-CM

## 2022-05-12 DIAGNOSIS — Z7984 Long term (current) use of oral hypoglycemic drugs: Secondary | ICD-10-CM

## 2022-05-12 DIAGNOSIS — R413 Other amnesia: Secondary | ICD-10-CM | POA: Diagnosis not present

## 2022-05-12 DIAGNOSIS — S299XXA Unspecified injury of thorax, initial encounter: Secondary | ICD-10-CM | POA: Diagnosis not present

## 2022-05-12 DIAGNOSIS — Z79899 Other long term (current) drug therapy: Secondary | ICD-10-CM

## 2022-05-12 DIAGNOSIS — Y92009 Unspecified place in unspecified non-institutional (private) residence as the place of occurrence of the external cause: Secondary | ICD-10-CM | POA: Diagnosis not present

## 2022-05-12 DIAGNOSIS — I10 Essential (primary) hypertension: Secondary | ICD-10-CM | POA: Diagnosis present

## 2022-05-12 DIAGNOSIS — N189 Chronic kidney disease, unspecified: Secondary | ICD-10-CM | POA: Diagnosis present

## 2022-05-12 DIAGNOSIS — Z9181 History of falling: Secondary | ICD-10-CM | POA: Diagnosis not present

## 2022-05-12 DIAGNOSIS — R2681 Unsteadiness on feet: Secondary | ICD-10-CM | POA: Diagnosis not present

## 2022-05-12 DIAGNOSIS — S0012XD Contusion of left eyelid and periocular area, subsequent encounter: Secondary | ICD-10-CM | POA: Diagnosis not present

## 2022-05-12 DIAGNOSIS — Z923 Personal history of irradiation: Secondary | ICD-10-CM

## 2022-05-12 DIAGNOSIS — R488 Other symbolic dysfunctions: Secondary | ICD-10-CM | POA: Diagnosis not present

## 2022-05-12 DIAGNOSIS — I480 Paroxysmal atrial fibrillation: Secondary | ICD-10-CM | POA: Diagnosis not present

## 2022-05-12 DIAGNOSIS — R748 Abnormal levels of other serum enzymes: Secondary | ICD-10-CM

## 2022-05-12 DIAGNOSIS — R4182 Altered mental status, unspecified: Secondary | ICD-10-CM

## 2022-05-12 DIAGNOSIS — R000 Tachycardia, unspecified: Secondary | ICD-10-CM | POA: Diagnosis not present

## 2022-05-12 DIAGNOSIS — Z7901 Long term (current) use of anticoagulants: Secondary | ICD-10-CM

## 2022-05-12 DIAGNOSIS — I129 Hypertensive chronic kidney disease with stage 1 through stage 4 chronic kidney disease, or unspecified chronic kidney disease: Secondary | ICD-10-CM | POA: Diagnosis present

## 2022-05-12 DIAGNOSIS — R531 Weakness: Secondary | ICD-10-CM | POA: Diagnosis not present

## 2022-05-12 DIAGNOSIS — N179 Acute kidney failure, unspecified: Principal | ICD-10-CM | POA: Diagnosis present

## 2022-05-12 DIAGNOSIS — S0003XA Contusion of scalp, initial encounter: Secondary | ICD-10-CM | POA: Diagnosis not present

## 2022-05-12 DIAGNOSIS — I152 Hypertension secondary to endocrine disorders: Secondary | ICD-10-CM | POA: Diagnosis not present

## 2022-05-12 DIAGNOSIS — Z6833 Body mass index (BMI) 33.0-33.9, adult: Secondary | ICD-10-CM

## 2022-05-12 DIAGNOSIS — G9341 Metabolic encephalopathy: Secondary | ICD-10-CM | POA: Diagnosis not present

## 2022-05-12 DIAGNOSIS — Z7401 Bed confinement status: Secondary | ICD-10-CM | POA: Diagnosis not present

## 2022-05-12 DIAGNOSIS — M6281 Muscle weakness (generalized): Secondary | ICD-10-CM | POA: Diagnosis not present

## 2022-05-12 DIAGNOSIS — Z87891 Personal history of nicotine dependence: Secondary | ICD-10-CM

## 2022-05-12 DIAGNOSIS — E876 Hypokalemia: Secondary | ICD-10-CM | POA: Diagnosis present

## 2022-05-12 DIAGNOSIS — G4710 Hypersomnia, unspecified: Secondary | ICD-10-CM | POA: Diagnosis present

## 2022-05-12 DIAGNOSIS — Z1152 Encounter for screening for COVID-19: Secondary | ICD-10-CM | POA: Diagnosis not present

## 2022-05-12 DIAGNOSIS — D72819 Decreased white blood cell count, unspecified: Secondary | ICD-10-CM | POA: Diagnosis present

## 2022-05-12 DIAGNOSIS — I9589 Other hypotension: Secondary | ICD-10-CM | POA: Diagnosis present

## 2022-05-12 DIAGNOSIS — Z978 Presence of other specified devices: Secondary | ICD-10-CM | POA: Diagnosis not present

## 2022-05-12 DIAGNOSIS — S0011XD Contusion of right eyelid and periocular area, subsequent encounter: Secondary | ICD-10-CM | POA: Diagnosis not present

## 2022-05-12 DIAGNOSIS — J9811 Atelectasis: Secondary | ICD-10-CM | POA: Diagnosis not present

## 2022-05-12 DIAGNOSIS — S3993XA Unspecified injury of pelvis, initial encounter: Secondary | ICD-10-CM | POA: Diagnosis not present

## 2022-05-12 DIAGNOSIS — S199XXA Unspecified injury of neck, initial encounter: Secondary | ICD-10-CM | POA: Diagnosis not present

## 2022-05-12 DIAGNOSIS — R911 Solitary pulmonary nodule: Secondary | ICD-10-CM | POA: Diagnosis not present

## 2022-05-12 DIAGNOSIS — W19XXXD Unspecified fall, subsequent encounter: Secondary | ICD-10-CM | POA: Diagnosis not present

## 2022-05-12 DIAGNOSIS — C61 Malignant neoplasm of prostate: Secondary | ICD-10-CM | POA: Diagnosis not present

## 2022-05-12 DIAGNOSIS — E1159 Type 2 diabetes mellitus with other circulatory complications: Secondary | ICD-10-CM | POA: Diagnosis not present

## 2022-05-12 HISTORY — DX: Paroxysmal atrial fibrillation: I48.0

## 2022-05-12 LAB — COMPREHENSIVE METABOLIC PANEL
ALT: 41 U/L (ref 0–44)
AST: 62 U/L — ABNORMAL HIGH (ref 15–41)
Albumin: 3.6 g/dL (ref 3.5–5.0)
Alkaline Phosphatase: 49 U/L (ref 38–126)
Anion gap: 12 (ref 5–15)
BUN: 32 mg/dL — ABNORMAL HIGH (ref 8–23)
CO2: 22 mmol/L (ref 22–32)
Calcium: 8.3 mg/dL — ABNORMAL LOW (ref 8.9–10.3)
Chloride: 101 mmol/L (ref 98–111)
Creatinine, Ser: 1.49 mg/dL — ABNORMAL HIGH (ref 0.61–1.24)
GFR, Estimated: 48 mL/min — ABNORMAL LOW (ref >60)
Glucose, Bld: 71 mg/dL (ref 70–99)
Potassium: 3.6 mmol/L (ref 3.5–5.1)
Sodium: 135 mmol/L (ref 135–145)
Total Bilirubin: 0.8 mg/dL (ref 0.3–1.2)
Total Protein: 6.4 g/dL — ABNORMAL LOW (ref 6.5–8.1)

## 2022-05-12 LAB — CBC
HCT: 43.1 % (ref 39.0–52.0)
Hemoglobin: 14.3 g/dL (ref 13.0–17.0)
MCH: 29.4 pg (ref 26.0–34.0)
MCHC: 33.2 g/dL (ref 30.0–36.0)
MCV: 88.5 fL (ref 80.0–100.0)
Platelets: 106 10*3/uL — ABNORMAL LOW (ref 150–400)
RBC: 4.87 MIL/uL (ref 4.22–5.81)
RDW: 13.1 % (ref 11.5–15.5)
WBC: 4 10*3/uL (ref 4.0–10.5)
nRBC: 0 % (ref 0.0–0.2)

## 2022-05-12 LAB — I-STAT CHEM 8, ED
BUN: 34 mg/dL — ABNORMAL HIGH (ref 8–23)
Calcium, Ion: 0.95 mmol/L — ABNORMAL LOW (ref 1.15–1.40)
Chloride: 100 mmol/L (ref 98–111)
Creatinine, Ser: 1.5 mg/dL — ABNORMAL HIGH (ref 0.61–1.24)
Glucose, Bld: 67 mg/dL — ABNORMAL LOW (ref 70–99)
HCT: 42 % (ref 39.0–52.0)
Hemoglobin: 14.3 g/dL (ref 13.0–17.0)
Potassium: 3.5 mmol/L (ref 3.5–5.1)
Sodium: 136 mmol/L (ref 135–145)
TCO2: 23 mmol/L (ref 22–32)

## 2022-05-12 LAB — CBG MONITORING, ED
Glucose-Capillary: 178 mg/dL — ABNORMAL HIGH (ref 70–99)
Glucose-Capillary: 45 mg/dL — ABNORMAL LOW (ref 70–99)
Glucose-Capillary: 84 mg/dL (ref 70–99)

## 2022-05-12 LAB — URINALYSIS, ROUTINE W REFLEX MICROSCOPIC
Bacteria, UA: NONE SEEN
Bilirubin Urine: NEGATIVE
Glucose, UA: NEGATIVE mg/dL
Ketones, ur: NEGATIVE mg/dL
Leukocytes,Ua: NEGATIVE
Nitrite: NEGATIVE
Protein, ur: 100 mg/dL — AB
Specific Gravity, Urine: 1.025 (ref 1.005–1.030)
pH: 5 (ref 5.0–8.0)

## 2022-05-12 LAB — RESP PANEL BY RT-PCR (RSV, FLU A&B, COVID)  RVPGX2
Influenza A by PCR: NEGATIVE
Influenza B by PCR: NEGATIVE
Resp Syncytial Virus by PCR: NEGATIVE
SARS Coronavirus 2 by RT PCR: NEGATIVE

## 2022-05-12 LAB — SAMPLE TO BLOOD BANK

## 2022-05-12 LAB — PROTIME-INR
INR: 1.5 — ABNORMAL HIGH (ref 0.8–1.2)
Prothrombin Time: 17.6 seconds — ABNORMAL HIGH (ref 11.4–15.2)

## 2022-05-12 LAB — LACTIC ACID, PLASMA: Lactic Acid, Venous: 0.9 mmol/L (ref 0.5–1.9)

## 2022-05-12 LAB — CK: Total CK: 419 U/L — ABNORMAL HIGH (ref 49–397)

## 2022-05-12 LAB — ETHANOL: Alcohol, Ethyl (B): <10 mg/dL (ref <10)

## 2022-05-12 MED ORDER — ONDANSETRON HCL 4 MG PO TABS: 4.0000 mg | ORAL_TABLET | Freq: Four times a day (QID) | ORAL | Status: DC | PRN

## 2022-05-12 MED ORDER — ACETAMINOPHEN 650 MG RE SUPP
650.0000 mg | Freq: Four times a day (QID) | RECTAL | Status: DC | PRN
  Administered 2022-05-12: 650 mg via RECTAL
  Filled 2022-05-12: qty 1

## 2022-05-12 MED ORDER — SODIUM CHLORIDE 0.9 % IV SOLN: INTRAVENOUS | Status: DC

## 2022-05-12 MED ORDER — ACETAMINOPHEN 325 MG PO TABS
650.0000 mg | ORAL_TABLET | Freq: Four times a day (QID) | ORAL | Status: DC | PRN
  Administered 2022-05-18 – 2022-05-21 (×3): 650 mg via ORAL
  Filled 2022-05-12 (×3): qty 2

## 2022-05-12 MED ORDER — SODIUM CHLORIDE 0.9% FLUSH
3.0000 mL | Freq: Two times a day (BID) | INTRAVENOUS | Status: DC
  Administered 2022-05-12 – 2022-05-22 (×19): 3 mL via INTRAVENOUS

## 2022-05-12 MED ORDER — DEXTROSE 50 % IV SOLN
12.5000 g | INTRAVENOUS | Status: DC | PRN
  Administered 2022-05-13: 12.5 g via INTRAVENOUS
  Filled 2022-05-12: qty 50

## 2022-05-12 MED ORDER — SODIUM CHLORIDE 0.9 % IV BOLUS
1000.0000 mL | Freq: Once | INTRAVENOUS | Status: AC
  Administered 2022-05-12: 1000 mL via INTRAVENOUS

## 2022-05-12 MED ORDER — ACETAMINOPHEN 650 MG RE SUPP: 650.0000 mg | Freq: Four times a day (QID) | RECTAL | Status: DC | PRN

## 2022-05-12 MED ORDER — HALOPERIDOL LACTATE 5 MG/ML IJ SOLN
2.0000 mg | Freq: Once | INTRAMUSCULAR | Status: AC
  Administered 2022-05-12: 2 mg via INTRAVENOUS
  Filled 2022-05-12: qty 1

## 2022-05-12 MED ORDER — ACETAMINOPHEN 500 MG PO TABS
1000.0000 mg | ORAL_TABLET | Freq: Once | ORAL | Status: DC
  Filled 2022-05-12: qty 2

## 2022-05-12 MED ORDER — DEXTROSE 50 % IV SOLN: 1.0000 | Freq: Once | INTRAVENOUS | Status: AC

## 2022-05-12 MED ORDER — ONDANSETRON HCL 4 MG/2ML IJ SOLN: 4.0000 mg | Freq: Four times a day (QID) | INTRAMUSCULAR | Status: DC | PRN

## 2022-05-12 MED ORDER — LORAZEPAM 2 MG/ML IJ SOLN
1.0000 mg | Freq: Once | INTRAMUSCULAR | Status: AC | PRN
  Administered 2022-05-16: 1 mg via INTRAVENOUS
  Filled 2022-05-12: qty 1

## 2022-05-12 MED ORDER — DEXTROSE 50 % IV SOLN
INTRAVENOUS | Status: AC
  Administered 2022-05-12: 50 mL via INTRAVENOUS
  Filled 2022-05-12: qty 50

## 2022-05-12 MED ORDER — METOPROLOL TARTRATE 25 MG PO TABS
25.0000 mg | ORAL_TABLET | Freq: Two times a day (BID) | ORAL | Status: DC
  Administered 2022-05-13 – 2022-05-14 (×4): 25 mg via ORAL
  Filled 2022-05-12 (×4): qty 1

## 2022-05-12 MED ORDER — SENNOSIDES-DOCUSATE SODIUM 8.6-50 MG PO TABS: 1.0000 | ORAL_TABLET | Freq: Every evening | ORAL | Status: DC | PRN

## 2022-05-12 MED ORDER — IOHEXOL 350 MG/ML SOLN
75.0000 mL | Freq: Once | INTRAVENOUS | Status: AC | PRN
  Administered 2022-05-12: 75 mL via INTRAVENOUS

## 2022-05-12 MED ORDER — LORAZEPAM 2 MG/ML IJ SOLN
1.0000 mg | Freq: Once | INTRAMUSCULAR | Status: AC
  Administered 2022-05-12: 1 mg via INTRAVENOUS
  Filled 2022-05-12: qty 1

## 2022-05-12 NOTE — Progress Notes (Signed)
Orthopedic Tech Progress Note Patient Details:  James Dyer 11-Apr-1945 [id number]  Level II trauma, ortho tech not needed at this time  Patient ID: James Dyer, male   DOB: 06-Nov-1945, 77 y.o.   MRN: [id number]  [name] 05/12/2022, 6:54 PM

## 2022-05-12 NOTE — ED Notes (Signed)
ED TO INPATIENT HANDOFF REPORT  ED Nurse Name and Phone #: Kinzleigh Kandler  S Name/Age/Gender James Dyer 77 y.o. male Room/Bed: 005C/005C  Code Status   Code Status: Prior  Home/SNF/Other Home Patient oriented to: self Is this baseline? Yes   Triage Complete: Triage complete  Chief Complaint Acute kidney injury [N17.9]  Triage Note Pt had fall onto hardwood floor onto his face PTA.  Both eyes and bridge of nose have bruising.  Unable to remember if he passed out and fell or had a trip and fall.  Taken to room and Level 2 trauma paged out.    Patient fell onto floor and hit face at home. Pt alert and oriented. Denies LOC. + eliquis, reports multiple falls over the past couple of days.    Allergies Allergies  Allergen Reactions   Latex Other (See Comments)    "sores in mouth after teeth cleaned"    Level of Care/Admitting Diagnosis ED Disposition     ED Disposition  Admit   Condition  --   Comment  Hospital [facility] [[id number]]  Level of Care: Telemetry Cardiac [103]  May place patient in observation at [facility] or [facility] if equivalent level of care is available:: No  Covid Evaluation: Confirmed COVID Negative  Diagnosis: Acute kidney injury [416606]  Admitting Physician: Charlsie Quest [[id number]]  Attending Physician: Charlsie Quest [[id number]]          B Medical/Surgery History Past Medical History:  Diagnosis Date   Arthritis    HNP- lumbar   Borderline diabetes    Cancer 2013   prostate , treated /w radiation    Diabetes mellitus without complication    Foot drop, bilateral since birthpt wears braces in shoes   pt must have braces to walk   Hepatitis    "pt told had been exposed to hepatitis when he went to donate blood"   History of kidney stones    History of radiation therapy 04/12/11 - 06/06/11   prostate, seminal vesicles   History of stress test    Echocardiogram done - 03/2013   Hypercholesterolemia    states  Dr. Renette Butters Rx Welchol- pt. hs stopped taking because he believe it gives him  leg cramps.   Hypertension    Nephrolithiasis    renal calculi- , also has been told that he has a cyst on kidney - R side    Neuromuscular disorder    bilateral foot drop- both feet    Paroxysmal atrial fibrillation    Past Surgical History:  Procedure Laterality Date   BACK SURGERY     CARDIOVERSION N/A 02/28/2021   Procedure: CARDIOVERSION;  Surgeon: Thurmon Fair, MD;  Location: [facility];  Service: Cardiovascular;  Laterality: N/A;   COLONOSCOPY N/A 07/22/2012   Procedure: COLONOSCOPY;  Surgeon: Dalia Heading, MD;  Location: [facility];  Service: Gastroenterology;  Laterality: N/A;   ESOPHAGEAL DILATION N/A 04/22/2019   Procedure: ESOPHAGEAL DILATION;  Surgeon: Malissa Hippo, MD;  Location: [facility];  Service: Endoscopy;  Laterality: N/A;   ESOPHAGOGASTRODUODENOSCOPY N/A 04/22/2019   Procedure: ESOPHAGOGASTRODUODENOSCOPY (EGD);  Surgeon: Malissa Hippo, MD;  Location: [facility];  Service: Endoscopy;  Laterality: N/A;  145   EXTRACORPOREAL SHOCK WAVE LITHOTRIPSY Left 03/04/2017   Procedure: LEFT EXTRACORPOREAL SHOCK WAVE LITHOTRIPSY (ESWL);  Surgeon: Rene Paci, MD;  Location: [facility];  Service: Urology;  Laterality: Left;   FOOT SURGERY Right 1980's   calcium  deposit removed   LUMBAR LAMINECTOMY/DECOMPRESSION MICRODISCECTOMY Right 09/01/2013   Procedure: RIGHT LUMBAR FOUR-FIVE LAMINECTOMY/DECOMPRESSION MICRODISCECTOMY 1 LEVEL;  Surgeon: Coletta Memos, MD;  Location: [facility];  Service: Neurosurgery;  Laterality: Right;  Right L45 microdiskectomy   prostate biopsy  2013   SHOULDER OPEN ROTATOR CUFF REPAIR Left 09/09/2012   Procedure: LEFT ROTATOR CUFF REPAIR SHOULDER OPEN;  Surgeon: Jacki Cones, MD;  Location: [facility];  Service: Orthopedics;  Laterality: Left;     A IV Location/Drains/Wounds Patient Lines/Drains/Airways Status     Active Line/Drains/Airways     Name  Placement date Placement time Site Days   Peripheral IV 05/12/22 20 G Anterior;Distal;Right;Upper Arm 05/12/22  1819  Arm  less than 1   Incision 09/09/12 Shoulder Left 09/09/12  1134  -- 3532   Incision (Closed) 09/01/13 Back Other (Comment) 09/01/13  1809  -- 3175            Intake/Output Last 24 hours No intake or output data in the 24 hours ending 05/12/22 2333  Labs/Imaging Results for orders placed or performed during the hospital encounter of 05/12/22 (from the past 48 hour(s))  Comprehensive metabolic panel     Status: Abnormal   Collection Time: 05/12/22  6:20 PM  Result Value Ref Range   Sodium 135 135 - 145 mmol/L   Potassium 3.6 3.5 - 5.1 mmol/L   Chloride 101 98 - 111 mmol/L   CO2 22 22 - 32 mmol/L   Glucose, Bld 71 70 - 99 mg/dL    Comment: Glucose reference range applies only to samples taken after fasting for at least 8 hours.   BUN 32 (H) 8 - 23 mg/dL   Creatinine, Ser 1.61 (H) 0.61 - 1.24 mg/dL   Calcium 8.3 (L) 8.9 - 10.3 mg/dL   Total Protein 6.4 (L) 6.5 - 8.1 g/dL   Albumin 3.6 3.5 - 5.0 g/dL   AST 62 (H) 15 - 41 U/L   ALT 41 0 - 44 U/L   Alkaline Phosphatase 49 38 - 126 U/L   Total Bilirubin 0.8 0.3 - 1.2 mg/dL   GFR, Estimated 48 (L) >60 mL/min    Comment: (NOTE) Calculated using the CKD-EPI Creatinine Equation (2021)    Anion gap 12 5 - 15    Comment: Performed at [facility], [address], [place], [place] [address]  CBC     Status: Abnormal   Collection Time: 05/12/22  6:20 PM  Result Value Ref Range   WBC 4.0 4.0 - 10.5 K/uL   RBC 4.87 4.22 - 5.81 MIL/uL   Hemoglobin 14.3 13.0 - 17.0 g/dL   HCT 54.0 98.1 - 19.1 %   MCV 88.5 80.0 - 100.0 fL   MCH 29.4 26.0 - 34.0 pg   MCHC 33.2 30.0 - 36.0 g/dL   RDW 47.8 29.5 - 62.1 %   Platelets 106 (L) 150 - 400 K/uL    Comment: REPEATED TO VERIFY   nRBC 0.0 0.0 - 0.2 %    Comment: Performed at [facility], [address] [place], [place] [address]  Ethanol     Status: None    Collection Time: 05/12/22  6:20 PM  Result Value Ref Range   Alcohol, Ethyl (B) <10 <10 mg/dL    Comment: (NOTE) Lowest detectable limit for serum alcohol is 10 mg/dL.  For medical purposes only. Performed at [facility], [address] [place], [place] [address]   Lactic acid, plasma  Status: None   Collection Time: 05/12/22  6:20 PM  Result Value Ref Range   Lactic Acid, Venous 0.9 0.5 - 1.9 mmol/L    Comment: Performed at [facility], [address] [place], [place] [address]  Protime-INR     Status: Abnormal   Collection Time: 05/12/22  6:20 PM  Result Value Ref Range   Prothrombin Time 17.6 (H) 11.4 - 15.2 seconds   INR 1.5 (H) 0.8 - 1.2    Comment: (NOTE) INR goal varies based on device and disease states. Performed at [facility], [address], [place], [place] [address]   Sample to Blood Bank     Status: None   Collection Time: 05/12/22  6:20 PM  Result Value Ref Range   Blood Bank Specimen SAMPLE AVAILABLE FOR TESTING    Sample Expiration      05/15/2022,2359 Performed at [facility], [address] [place], [place] [address]   CK     Status: Abnormal   Collection Time: 05/12/22  6:20 PM  Result Value Ref Range   Total CK 419 (H) 49 - 397 U/L    Comment: Performed at [facility], [address] [place], [place] [address]  I-Stat Chem 8, ED     Status: Abnormal   Collection Time: 05/12/22  6:30 PM  Result Value Ref Range   Sodium 136 135 - 145 mmol/L   Potassium 3.5 3.5 - 5.1 mmol/L   Chloride 100 98 - 111 mmol/L   BUN 34 (H) 8 - 23 mg/dL   Creatinine, Ser 2.95 (H) 0.61 - 1.24 mg/dL   Glucose, Bld 67 (L) 70 - 99 mg/dL    Comment: Glucose reference range applies only to samples taken after fasting for at least 8 hours.   Calcium, Ion 0.95 (L) 1.15 - 1.40 mmol/L   TCO2 23 22 - 32 mmol/L   Hemoglobin 14.3 13.0 - 17.0 g/dL   HCT 62.1 30.8 - 65.7 %  Resp panel by RT-PCR (RSV, Flu A&B, Covid) Anterior Nasal Swab      Status: None   Collection Time: 05/12/22  9:00 PM   Specimen: Anterior Nasal Swab  Result Value Ref Range   SARS Coronavirus 2 by RT PCR NEGATIVE NEGATIVE   Influenza A by PCR NEGATIVE NEGATIVE   Influenza B by PCR NEGATIVE NEGATIVE    Comment: (NOTE) The Xpert Xpress SARS-CoV-2/FLU/RSV plus assay is intended as an aid in the diagnosis of influenza from Nasopharyngeal swab specimens and should not be used as a sole basis for treatment. Nasal washings and aspirates are unacceptable for Xpert Xpress SARS-CoV-2/FLU/RSV testing.  Fact Sheet for Patients: BloggerCourse.com  Fact Sheet for Healthcare Providers: SeriousBroker.it  This test is not yet approved or cleared by the [place] FDA and has been authorized for detection and/or diagnosis of SARS-CoV-2 by FDA under an Emergency Use Authorization (EUA). This EUA will remain in effect (meaning this test can be used) for the duration of the COVID-19 declaration under Section 564(b)(1) of the Act, 21 U.S.C. section 360bbb-3(b)(1), unless the authorization is terminated or revoked.     Resp Syncytial Virus by PCR NEGATIVE NEGATIVE    Comment: (NOTE) Fact Sheet for Patients: BloggerCourse.com  Fact Sheet for Healthcare Providers: SeriousBroker.it  This test is not yet approved or cleared by the [place] FDA and has been authorized for detection and/or diagnosis of SARS-CoV-2 by FDA under an Emergency Use Authorization (EUA). This EUA will  remain in effect (meaning this test can be used) for the duration of the COVID-19 declaration under Section 564(b)(1) of the Act, 21 U.S.C. section 360bbb-3(b)(1), unless the authorization is terminated or revoked.  Performed at [facility], [address], [place], [place] [address]   POC CBG, ED     Status: Abnormal   Collection Time: 05/12/22  9:02 PM  Result Value Ref Range    Glucose-Capillary 45 (L) 70 - 99 mg/dL    Comment: Glucose reference range applies only to samples taken after fasting for at least 8 hours.  CBG monitoring, ED     Status: Abnormal   Collection Time: 05/12/22  9:16 PM  Result Value Ref Range   Glucose-Capillary 178 (H) 70 - 99 mg/dL    Comment: Glucose reference range applies only to samples taken after fasting for at least 8 hours.  Urinalysis, Routine w reflex microscopic -Urine, Random     Status: Abnormal   Collection Time: 05/12/22 10:08 PM  Result Value Ref Range   Color, Urine YELLOW YELLOW   APPearance HAZY (A) CLEAR   Specific Gravity, Urine 1.025 1.005 - 1.030   pH 5.0 5.0 - 8.0   Glucose, UA NEGATIVE NEGATIVE mg/dL   Hgb urine dipstick SMALL (A) NEGATIVE   Bilirubin Urine NEGATIVE NEGATIVE   Ketones, ur NEGATIVE NEGATIVE mg/dL   Protein, ur 604 (A) NEGATIVE mg/dL   Nitrite NEGATIVE NEGATIVE   Leukocytes,Ua NEGATIVE NEGATIVE   RBC / HPF 0-5 0 - 5 RBC/hpf   WBC, UA 0-5 0 - 5 WBC/hpf   Bacteria, UA NONE SEEN NONE SEEN   Squamous Epithelial / HPF 0-5 0 - 5 /HPF   Mucus PRESENT    Hyaline Casts, UA PRESENT     Comment: Performed at [facility], [address], [place], [place] [address]  CBG monitoring, ED     Status: None   Collection Time: 05/12/22 11:27 PM  Result Value Ref Range   Glucose-Capillary 84 70 - 99 mg/dL    Comment: Glucose reference range applies only to samples taken after fasting for at least 8 hours.   CT HEAD WO CONTRAST  Result Date: 05/12/2022 CLINICAL DATA:  Head trauma, moderate-severe; Facial trauma, blunt; Neck trauma (Age >= 65y) EXAM: CT HEAD WITHOUT CONTRAST CT MAXILLOFACIAL WITHOUT CONTRAST CT CERVICAL SPINE WITHOUT CONTRAST TECHNIQUE: Multidetector CT imaging of the head, cervical spine, and maxillofacial structures were performed using the standard protocol without intravenous contrast. Multiplanar CT image reconstructions of the cervical spine and maxillofacial structures were also  generated. RADIATION DOSE REDUCTION: This exam was performed according to the departmental dose-optimization program which includes automated exposure control, adjustment of the mA and/or kV according to patient size and/or use of iterative reconstruction technique. COMPARISON:  CT head 09/25/2021 FINDINGS: CT HEAD FINDINGS Brain: Cerebral ventricle sizes are concordant with the degree of cerebral volume loss. Patchy and confluent areas of decreased attenuation are noted throughout the deep and periventricular white matter of the cerebral hemispheres bilaterally, compatible with chronic microvascular ischemic disease. No evidence of large-territorial acute infarction. No parenchymal hemorrhage. No mass lesion. No extra-axial collection. No mass effect or midline shift. No hydrocephalus. Basilar cisterns are patent. Vascular: No hyperdense vessel. Atherosclerotic calcifications are present within the cavernous internal carotid and vertebral arteries. Skull: No acute fracture or focal lesion. Other: Right frontal scalp hematoma formation. CT MAXILLOFACIAL FINDINGS Osseous: No fracture or mandibular dislocation. No destructive process. Sinuses/Orbits: Paranasal sinuses and mastoid air cells are  clear. The orbits are unremarkable. Soft tissues: Negative. CT CERVICAL SPINE FINDINGS Alignment: Normal. Skull base and vertebrae: Severe multilevel degenerative changes spine most prominent at the C5-C6 level. Multilevel moderate to severe osseous neural foraminal stenosis. No severe osseous central canal stenosis. No acute fracture. No aggressive appearing focal osseous lesion or focal pathologic process. Soft tissues and spinal canal: No prevertebral fluid or swelling. No visible canal hematoma. Upper chest: Interlobular septal wall thickening. Other: None. IMPRESSION: 1. No acute intracranial abnormality. 2.  No acute displaced facial fracture. 3. No acute displaced fracture or traumatic listhesis of the cervical spine. 4.  Likely mild pulmonary edema. Electronically Signed   By: Tish Frederickson M.D.   On: 05/12/2022 21:38   CT Cervical Spine Wo Contrast  Result Date: 05/12/2022 CLINICAL DATA:  Head trauma, moderate-severe; Facial trauma, blunt; Neck trauma (Age >= 65y) EXAM: CT HEAD WITHOUT CONTRAST CT MAXILLOFACIAL WITHOUT CONTRAST CT CERVICAL SPINE WITHOUT CONTRAST TECHNIQUE: Multidetector CT imaging of the head, cervical spine, and maxillofacial structures were performed using the standard protocol without intravenous contrast. Multiplanar CT image reconstructions of the cervical spine and maxillofacial structures were also generated. RADIATION DOSE REDUCTION: This exam was performed according to the departmental dose-optimization program which includes automated exposure control, adjustment of the mA and/or kV according to patient size and/or use of iterative reconstruction technique. COMPARISON:  CT head 09/25/2021 FINDINGS: CT HEAD FINDINGS Brain: Cerebral ventricle sizes are concordant with the degree of cerebral volume loss. Patchy and confluent areas of decreased attenuation are noted throughout the deep and periventricular white matter of the cerebral hemispheres bilaterally, compatible with chronic microvascular ischemic disease. No evidence of large-territorial acute infarction. No parenchymal hemorrhage. No mass lesion. No extra-axial collection. No mass effect or midline shift. No hydrocephalus. Basilar cisterns are patent. Vascular: No hyperdense vessel. Atherosclerotic calcifications are present within the cavernous internal carotid and vertebral arteries. Skull: No acute fracture or focal lesion. Other: Right frontal scalp hematoma formation. CT MAXILLOFACIAL FINDINGS Osseous: No fracture or mandibular dislocation. No destructive process. Sinuses/Orbits: Paranasal sinuses and mastoid air cells are clear. The orbits are unremarkable. Soft tissues: Negative. CT CERVICAL SPINE FINDINGS Alignment: Normal. Skull base  and vertebrae: Severe multilevel degenerative changes spine most prominent at the C5-C6 level. Multilevel moderate to severe osseous neural foraminal stenosis. No severe osseous central canal stenosis. No acute fracture. No aggressive appearing focal osseous lesion or focal pathologic process. Soft tissues and spinal canal: No prevertebral fluid or swelling. No visible canal hematoma. Upper chest: Interlobular septal wall thickening. Other: None. IMPRESSION: 1. No acute intracranial abnormality. 2.  No acute displaced facial fracture. 3. No acute displaced fracture or traumatic listhesis of the cervical spine. 4. Likely mild pulmonary edema. Electronically Signed   By: Tish Frederickson M.D.   On: 05/12/2022 21:38   CT Maxillofacial Wo Contrast  Result Date: 05/12/2022 CLINICAL DATA:  Head trauma, moderate-severe; Facial trauma, blunt; Neck trauma (Age >= 65y) EXAM: CT HEAD WITHOUT CONTRAST CT MAXILLOFACIAL WITHOUT CONTRAST CT CERVICAL SPINE WITHOUT CONTRAST TECHNIQUE: Multidetector CT imaging of the head, cervical spine, and maxillofacial structures were performed using the standard protocol without intravenous contrast. Multiplanar CT image reconstructions of the cervical spine and maxillofacial structures were also generated. RADIATION DOSE REDUCTION: This exam was performed according to the departmental dose-optimization program which includes automated exposure control, adjustment of the mA and/or kV according to patient size and/or use of iterative reconstruction technique. COMPARISON:  CT head 09/25/2021 FINDINGS: CT HEAD FINDINGS Brain: Cerebral ventricle sizes are  concordant with the degree of cerebral volume loss. Patchy and confluent areas of decreased attenuation are noted throughout the deep and periventricular white matter of the cerebral hemispheres bilaterally, compatible with chronic microvascular ischemic disease. No evidence of large-territorial acute infarction. No parenchymal hemorrhage. No  mass lesion. No extra-axial collection. No mass effect or midline shift. No hydrocephalus. Basilar cisterns are patent. Vascular: No hyperdense vessel. Atherosclerotic calcifications are present within the cavernous internal carotid and vertebral arteries. Skull: No acute fracture or focal lesion. Other: Right frontal scalp hematoma formation. CT MAXILLOFACIAL FINDINGS Osseous: No fracture or mandibular dislocation. No destructive process. Sinuses/Orbits: Paranasal sinuses and mastoid air cells are clear. The orbits are unremarkable. Soft tissues: Negative. CT CERVICAL SPINE FINDINGS Alignment: Normal. Skull base and vertebrae: Severe multilevel degenerative changes spine most prominent at the C5-C6 level. Multilevel moderate to severe osseous neural foraminal stenosis. No severe osseous central canal stenosis. No acute fracture. No aggressive appearing focal osseous lesion or focal pathologic process. Soft tissues and spinal canal: No prevertebral fluid or swelling. No visible canal hematoma. Upper chest: Interlobular septal wall thickening. Other: None. IMPRESSION: 1. No acute intracranial abnormality. 2.  No acute displaced facial fracture. 3. No acute displaced fracture or traumatic listhesis of the cervical spine. 4. Likely mild pulmonary edema. Electronically Signed   By: Tish Frederickson M.D.   On: 05/12/2022 21:38   CT CHEST ABDOMEN PELVIS W CONTRAST  Result Date: 05/12/2022 CLINICAL DATA:  Polytrauma, blunt.  Fall. EXAM: CT CHEST, ABDOMEN, AND PELVIS WITH CONTRAST TECHNIQUE: Multidetector CT imaging of the chest, abdomen and pelvis was performed following the standard protocol during bolus administration of intravenous contrast. RADIATION DOSE REDUCTION: This exam was performed according to the departmental dose-optimization program which includes automated exposure control, adjustment of the mA and/or kV according to patient size and/or use of iterative reconstruction technique. CONTRAST:  79mL  OMNIPAQUE IOHEXOL 350 MG/ML SOLN COMPARISON:  01/18/2017. FINDINGS: CT CHEST FINDINGS Cardiovascular: Heart is enlarged and there is no pericardial effusion. Coronary artery calcifications are noted. 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 The pulmonary trunk is normal in caliber. Mediastinum/Nodes: No enlarged mediastinal, hilar, or axillary lymph nodes. Thyroid gland, trachea, and esophagus demonstrate no significant findings. Lungs/Pleura: Atelectasis is present bilaterally. No effusion or pneumothorax. There is a subpleural nodule in the right middle lobe measuring 6 mm, axial image 88, unchanged from 2018 and likely benign. Musculoskeletal: Degenerative changes are present in the thoracic spine. No acute fracture. CT ABDOMEN PELVIS FINDINGS Hepatobiliary: No focal liver abnormality is seen. No gallstones, gallbladder wall thickening, or biliary dilatation. Pancreas: Unremarkable. No pancreatic ductal dilatation or surrounding inflammatory changes. Spleen: No splenic injury or perisplenic hematoma. Adrenals/Urinary Tract: The adrenal glands are within normal limits. Multiple cysts are present within the kidneys. No renal calculus or hydronephrosis. Bladder is unremarkable. Stomach/Bowel: Stomach is within normal limits. Appendix appears normal. No evidence of bowel wall thickening, distention, or inflammatory changes. No free air or pneumatosis. Vascular/Lymphatic: Aortic atherosclerosis. No enlarged abdominal or pelvic lymph nodes. Reproductive: Prostate is normal in size. Brachytherapy seeds are noted. Other: No abdominopelvic ascites. Musculoskeletal: Degenerative changes in the lumbar spine. No acute osseous abnormality. IMPRESSION: 1. No evidence of acute fracture or solid organ injury. 2. 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 Recommend annual imaging followup by CTA or MRA.  This recommendation follows 2010 ACCF/AHA/AATS/ACR/ASA/SCA/SCAI/SIR/STS/SVM Guidelines for the Diagnosis and Management of Patients with Thoracic  Aortic Disease. Circulation. 2010; 121: R604-V409. Aortic aneurysm NOS (ICD10-I71.9) 3. Coronary artery calcifications. Electronically Signed   By: Thornell Sartorius M.D.   On: 05/12/2022 21:21   DG Pelvis Portable  Result Date: 05/12/2022 CLINICAL DATA:  Trauma, fall. EXAM: PORTABLE PELVIS 1-2 VIEWS COMPARISON:  None Available. FINDINGS: The cortical margins of the bony pelvis are intact. No fracture. Pubic symphysis and sacroiliac joints are congruent. Both femoral heads are well-seated in the respective acetabula. Mild bilateral hip degenerative change. IMPRESSION: No pelvic fracture. Electronically Signed   By: Narda Rutherford M.D.   On: 05/12/2022 19:02   DG Chest Port 1 View  Result Date: 05/12/2022 CLINICAL DATA:  Trauma, fall. EXAM: PORTABLE CHEST 1 VIEW COMPARISON:  Radiograph 03/06/2013 FINDINGS: The cardiomediastinal contours are normal. Aortic arch atherosclerosis. Pulmonary vasculature is normal. No consolidation, pleural effusion, or pneumothorax. No acute osseous abnormalities are seen. IMPRESSION: No acute findings or evidence of traumatic injury. Electronically Signed   By: Narda Rutherford M.D.   On: 05/12/2022 19:01    Pending Labs Unresulted Labs (From admission, onward)     Start     Ordered   05/12/22 2243  Blood culture (routine x 2)  BLOOD CULTURE X 2,   R      05/12/22 2242            Vitals/Pain Today's Vitals   05/12/22 2145 05/12/22 2200 05/12/22 2215 05/12/22 2230  BP: 117/73 (!) 114/91 117/69 113/75  Pulse: 82 97 87 84  Resp: (!) 25 (!) 24 (!) 21 (!) 21  Temp:      TempSrc:      SpO2: 98% 96% 94% 94%  Weight:      Height:      PainSc:        Isolation Precautions No active isolations  Medications Medications  acetaminophen (TYLENOL) suppository 650 mg (650 mg Rectal Given 05/12/22 2111)  haloperidol  lactate (HALDOL) injection 2 mg (2 mg Intravenous Given 05/12/22 1947)  LORazepam (ATIVAN) injection 1 mg (1 mg Intravenous Given 05/12/22 1948)  dextrose 50 % solution 50 mL (50 mLs Intravenous Given 05/12/22 2105)  iohexol (OMNIPAQUE) 350 MG/ML injection 75 mL (75 mLs Intravenous Contrast Given 05/12/22 2107)  sodium chloride 0.9 % bolus 1,000 mL (1,000 mLs Intravenous New Bag/Given 05/12/22 2207)    Mobility walks     Focused Assessments Neuro Assessment Handoff:  Swallow screen pass? No  Cardiac Rhythm: Normal sinus rhythm       Neuro Assessment:   Neuro Checks:      Has TPA been given? No If patient is a Neuro Trauma and patient is going to OR before floor call report to 4N Charge nurse: [phone] or [phone]   R Recommendations: See Admitting Provider Note  Report given to:   Additional Notes:

## 2022-05-12 NOTE — Hospital Course (Signed)
James Dyer is a 77 y.o. male with medical history significant for PAF on Eliquis, T2DM, HTN, HLD, chronic bilateral foot drop, history of prostate cancer who is admitted with AKI and altered mental status after falling on his face at home.

## 2022-05-12 NOTE — Progress Notes (Signed)
   05/12/22 1836  Spiritual Encounters  Type of Visit Initial  Care provided to: [facility] partners present during encounter Nurse  Referral source Trauma page  Reason for visit Trauma  OnCall Visit Yes   Chaplain responded to a trauma in the ED - level II, fall on blood thinners. Met patient's family member. Chaplain extended hospitality. No needs at this time. Chaplain introduced spiritual care services. Spiritual care services available as needed.   Alda Ponder, Chaplain 05/12/22

## 2022-05-12 NOTE — H&P (Signed)
History and Physical    James Dyer:[id number] DOB: 1946-01-08 DOA: 05/12/2022  PCP: [name], MD  Patient coming from: Home  I have personally briefly reviewed patient's old medical records in [facility]  Chief Complaint: Fall at home, change in mental status  HPI: James Dyer is a 77 y.o. male with medical history significant for PAF on Eliquis, T2DM, HTN, HLD, chronic bilateral foot drop, history of prostate cancer who presented to the ED for evaluation after fall at home yesterday with altered mental status.  Patient is unable to provide history due to hypersomnolence and is otherwise obtained by EDP, chart review, and son at bedside.  Patient reportedly fell onto his face yesterday hitting hardwood at home.  He had positive loss of consciousness per conversation with EDP.  It is unclear how long he was on the ground.  He has had bruising periorbitally to both eyes since this fall.  He apparently fell again today but this time did not lose consciousness.  He did not indicate the circumstances around his fall.  He does have fall risk due to chronic foot drop.  He was reportedly increasingly confused today when son went to check in on him.  He was subsequently brought to the ED for further evaluation.  Patient is hypersomnolent and difficult to arouse after receiving IV Haldol and Ativan in the ED.  ED Course  Labs/Imaging on admission: I have personally reviewed following labs and imaging studies.  Initial vitals showed BP 127/67, pulse 85, RR 26, temp 98.3 F, SpO2 96% on room air.  Tmax 102.5 F rectally.  Labs show sodium 135, potassium 3.6, bicarb 22, BUN 32, creatinine 1.49, serum glucose 71, WBC 4.0, hemoglobin 14.3, platelets 106,000, serum ethanol <10, lactic acid 0.9, INR 1.5, CK4 119.  Urinalysis negative for UTI.  COVID, influenza, RSV negative.  Blood cultures ordered and pending.  Pelvic x-ray negative for pelvic fracture.  CT head, maxillofacial,  cervical spine without contrast obtained.  No acute intracranial abnormality, no acute displaced facial fracture, no acute displaced fracture or traumatic listhesis of the C-spine.  CT chest/abdomen/pelvis with contrast negative for acute fracture or solid organ injury.  Aortic atherosclerosis with aneurysmal dilatation of the ascending aorta measuring 4.1 cm noted.  Patient was hypoglycemic with CBG 67, improved to 178 after 1 amp D50.  Patient was given 1 L normal saline, 1 mg IV Ativan, 2 mg IV Haldol.  The hospitalist service was consulted to admit for further evaluation and management.  Review of Systems: All systems reviewed and are negative except as documented in history of present illness above.   Past Medical History:  Diagnosis Date   Arthritis    HNP- lumbar   Borderline diabetes    Cancer 2013   prostate , treated /w radiation    Diabetes mellitus without complication    Foot drop, bilateral since birthpt wears braces in shoes   pt must have braces to walk   Hepatitis    "pt told had been exposed to hepatitis when he went to donate blood"   History of kidney stones    History of radiation therapy 04/12/11 - 06/06/11   prostate, seminal vesicles   History of stress test    Echocardiogram done - 03/2013   Hypercholesterolemia    states Dr. Renette Butters Rx Welchol- pt. hs stopped taking because he believe it gives him  leg cramps.   Hypertension    Nephrolithiasis    renal calculi- ,  also has been told that he has a cyst on kidney - R side    Neuromuscular disorder    bilateral foot drop- both feet    Paroxysmal atrial fibrillation     Past Surgical History:  Procedure Laterality Date   BACK SURGERY     CARDIOVERSION N/A 02/28/2021   Procedure: CARDIOVERSION;  Surgeon: Thurmon Fair, MD;  Location: [facility];  Service: Cardiovascular;  Laterality: N/A;   COLONOSCOPY N/A 07/22/2012   Procedure: COLONOSCOPY;  Surgeon: Dalia Heading, MD;  Location: [facility];  Service:  Gastroenterology;  Laterality: N/A;   ESOPHAGEAL DILATION N/A 04/22/2019   Procedure: ESOPHAGEAL DILATION;  Surgeon: Malissa Hippo, MD;  Location: [facility];  Service: Endoscopy;  Laterality: N/A;   ESOPHAGOGASTRODUODENOSCOPY N/A 04/22/2019   Procedure: ESOPHAGOGASTRODUODENOSCOPY (EGD);  Surgeon: Malissa Hippo, MD;  Location: [facility];  Service: Endoscopy;  Laterality: N/A;  145   EXTRACORPOREAL SHOCK WAVE LITHOTRIPSY Left 03/04/2017   Procedure: LEFT EXTRACORPOREAL SHOCK WAVE LITHOTRIPSY (ESWL);  Surgeon: Rene Paci, MD;  Location: [facility];  Service: Urology;  Laterality: Left;   FOOT SURGERY Right 1980's   calcium deposit removed   LUMBAR LAMINECTOMY/DECOMPRESSION MICRODISCECTOMY Right 09/01/2013   Procedure: RIGHT LUMBAR FOUR-FIVE LAMINECTOMY/DECOMPRESSION MICRODISCECTOMY 1 LEVEL;  Surgeon: Coletta Memos, MD;  Location: [facility];  Service: Neurosurgery;  Laterality: Right;  Right L45 microdiskectomy   prostate biopsy  2013   SHOULDER OPEN ROTATOR CUFF REPAIR Left 09/09/2012   Procedure: LEFT ROTATOR CUFF REPAIR SHOULDER OPEN;  Surgeon: Jacki Cones, MD;  Location: [facility];  Service: Orthopedics;  Laterality: Left;    Social History:  reports that he quit smoking about 55 years ago. His smoking use included cigarettes. He has a 5.00 pack-year smoking history. He has never used smokeless tobacco. He reports current alcohol use. He reports that he does not use drugs.  Allergies  Allergen Reactions   Latex Other (See Comments)    "sores in mouth after teeth cleaned"    History reviewed. No pertinent family history.   Prior to Admission medications   Medication Sig Start Date End Date Taking? Authorizing Provider  amLODipine (NORVASC) 5 MG tablet Take 1 tablet (5 mg total) by mouth daily. 07/04/21 06/29/22 Yes Wendall Stade, MD  ELIQUIS 5 MG TABS tablet TAKE 1 TABLET(5 MG) BY MOUTH TWICE DAILY 02/05/22  Yes Wendall Stade, MD  ezetimibe (ZETIA) 10 MG tablet  Take 10 mg by mouth daily.   Yes [provider]  glimepiride (AMARYL) 1 MG tablet Take by mouth daily with breakfast.   Yes [provider]  metoprolol tartrate (LOPRESSOR) 25 MG tablet TAKE 1 TABLET(25 MG) BY MOUTH TWICE DAILY 07/21/21  Yes Wendall Stade, MD  solifenacin (VESICARE) 10 MG tablet Take 1 tablet (10 mg total) by mouth daily. 07/11/21  Yes Dahlstedt, Jeannett Senior, MD  cholecalciferol (VITAMIN D) 25 MCG (1000 UNIT) tablet Take 1,000 Units by mouth daily with lunch.    [provider]  fluticasone (FLONASE) 50 MCG/ACT nasal spray Place 2 sprays into both nostrils daily as needed for allergies or rhinitis.    [provider]  Lidocaine HCl (PAIN RELIEF ROLL-ON EX) Apply 1 application topically daily as needed (applied to feet if needed for pain.).    [provider]  Lidocaine HCl 4 % CREA Apply 1 application  topically daily.    [provider]  losartan (COZAAR) 100 MG tablet Take 100 mg by mouth daily.  [provider]  meclizine (ANTIVERT) 25 MG tablet Take 25 mg by mouth 2 (two) times daily as needed for dizziness. 05/04/20   [provider]  Multiple Vitamin (MULTIVITAMIN WITH MINERALS) TABS tablet Take 1 tablet by mouth daily.    [provider]  phenylephrine (SUDAFED PE) 10 MG TABS tablet Take 10 mg by mouth every 4 (four) hours as needed (congestion).    [provider]  triamcinolone cream (KENALOG) 0.1 % Apply 1 application topically 2 (two) times daily. Patient taking differently: Apply 1 application  topically 2 (two) times daily as needed (irritation). 09/13/20   Bing Neighbors, NP  Turmeric 500 MG CAPS Take 500 mg by mouth daily.    [provider]  vitamin B-12 (CYANOCOBALAMIN) 1000 MCG tablet Take 1,000 mcg by mouth every evening.     [provider]    Physical Exam: Vitals:   05/12/22 2145 05/12/22 2200 05/12/22 2215 05/12/22 2230  BP: 117/73 (!) 114/91 117/69  113/75  Pulse: 82 97 87 84  Resp: (!) 25 (!) 24 (!) 21 (!) 21  Temp:      TempSrc:      SpO2: 98% 96% 94% 94%  Weight:      Height:       Exam limited due to hypersomnolence Constitutional: Resting in bed, very somnolent and snoring.  Difficult to arouse.  Grimaces to noxious stimuli. Eyes: Periorbital bruising bilaterally ENMT: Mucous membranes are dry. Posterior pharynx clear of any exudate or lesions.Normal dentition.  Neck: normal, supple, no masses. Respiratory: clear to auscultation anteriorly. Normal respiratory effort. No accessory muscle use.  Cardiovascular: Irregularly irregular, no murmurs / rubs / gallops. No extremity edema. 2+ pedal pulses. Abdomen: no obvious tenderness elicited with palpation, no masses palpated. Musculoskeletal: no clubbing / cyanosis. No joint deformity upper and lower extremities.  Moves all extremities spontaneously Skin: Periorbital ecchymosis bilateral eyes with bruising across the bridge of the nose Neurologic: Hypersomnolent.  Sensation appears intact.  Moves upper extremities spontaneously.  Grimaces to noxious stimuli.  Toes downgoing with Babinski bilaterally but does not flex feet at ankles keeping with history of foot drop Psychiatric: Hypersomnolent, difficult to arouse  EKG: Personally reviewed.  Ordered and pending.  Assessment/Plan Principal Problem:   Acute kidney injury Active Problems:   HTN (hypertension)   Type 2 diabetes mellitus with hypoglycemia   Paroxysmal atrial fibrillation   Elevated CK   Fall at home, initial encounter   Altered mental status   Thrombocytopenia   Fever   ROZAY GILLELAND is a 77 y.o. male with medical history significant for PAF on Eliquis, T2DM, HTN, HLD, chronic bilateral foot drop, history of prostate cancer who is admitted with AKI and altered mental status after falling on his face at home.  Assessment and Plan: Acute kidney injury with mildly elevated CK: Appears volume depleted in setting  of fall at home with unspecified downtime.  Creatinine 1.49 compared to baseline 1.1.  CK 419. -Continue IV fluid hydration overnight -Repeat CK in a.m. -Monitor UOP  Fall at home/chronic bilateral foot drop: Multiple falls at home resulting in ecchymosis periorbitally both eyes.  Extensive imaging negative for significant traumatic injury.  Unclear circumstances regarding the falls.  At risk given chronic foot drop.  Son is concerned about possible stroke due to increased frequency of falls and would like further evaluation with MRI.  Exam is limited due to hypersomnolence after receiving sedating meds in the ED. -Obtain MRI brain without contrast -PT/OT  eval, continue fall precautions  Altered mental status: Reported mental status change after falling on his face at home.  CT head negative for acute changes.  Suspect postconcussive syndrome however neuroexam limited due to hypersomnolence from sedating meds and MRI brain to be obtained as above.  Type 2 diabetes with hypoglycemia: Hypoglycemia may have been contributing to mental status change.  CBG improved with 1 amp D50. -Hold Amaryl, continue hypoglycemia protocol with intermittent CBG checks  Fever: Tmax 102.5 F rectally while in the ED.  UA negative for UTI.  Extensive imaging negative for obvious infection.  No leukocytosis on admission.  COVID, flu, RSV PCR negative.  Blood cultures were obtained in the ED, will follow.  Continue to monitor off antibiotics.  Paroxysmal atrial fibrillation: In atrial fibrillation with controlled rate on admission. -Continue Lopressor 25 mg twice daily -Hold Eliquis for now with new thrombocytopenia and periorbital ecchymosis; resume if platelets remain stable  Thrombocytopenia: New mild thrombocytopenia.  Holding Eliquis tonight as above, repeat CBC in AM.  Hypertension: Continue Lopressor.  Hold losartan with AKI.  Hold amlodipine due to some borderline low blood pressures and resume as  necessary.  Ascending aortic aneurysm: 4.1 cm aneurysmal dilatation of the ascending aorta noted on CT chest.  Annual imaging follow-up by CTA or MRI recommended.  Findings discussed with patient's son at bedside.   DVT prophylaxis: SCDs Start: 05/12/22 2334 Code Status: Full code, discussed with son on admission. Family Communication: Son at bedside Disposition Plan: From home, dispo pending clinical progress Consults called: None Severity of Illness: The appropriate patient status for this patient is OBSERVATION. Observation status is judged to be reasonable and necessary in order to provide the required intensity of service to ensure the patient's safety. The patient's presenting symptoms, physical exam findings, and initial radiographic and laboratory data in the context of their medical condition is felt to place them at decreased risk for further clinical deterioration. Furthermore, it is anticipated that the patient will be medically stable for discharge from the hospital within 2 midnights of admission.   Darreld Mclean MD [facility]  If 7PM-7AM, please contact night-coverage [email or web address]  05/12/2022, 11:52 PM

## 2022-05-12 NOTE — ED Provider Notes (Signed)
  Physical Exam  BP 113/75   Pulse 84   Temp (!) 102.5 F (39.2 C) (Rectal)   Resp (!) 21   Ht 5\' 9"  (1.753 m)   Wt 105.6 kg   SpO2 94%   BMI 34.38 kg/m   Physical Exam  Procedures  Procedures  ED Course / MDM   Clinical Course as of 05/12/22 2252  Sat May 12, 2022  2038 Temp(!): 102.5 F (39.2 C) [JL]    Clinical Course User Index [JL] Ernie Avena, MD   Medical Decision Making Patient care assumed at 9 PM.  Patient is here after a fall yesterday.  Patient lives with his wife and his wife is going out of town and noticed that he is more altered and confused.  His son brought him in and was concerned about his mental status.  He has ecchymosis on his face.  Signed out pending trauma scan as well as admission.  Patient is febrile as well.  10:53 PM Patient's white blood cell count is normal.  His glucose was 45 and went up to 170 after D50.  Patient's lactate is normal.  Patient's trauma scan did not show any fractures or intra-abdominal bleeding or pneumonia.  I do not know what is the source of his fever.  I wonder if he had a viral process versus early bacterial infection.  Blood cultures were sent I will hold off on antibiotics for now.  Given his confusion after head injury, at this point, patient will be admitted for encephalopathy.   Problems Addressed: Fall, initial encounter: acute illness or injury Fever, unspecified fever cause: acute illness or injury Hypoglycemia: acute illness or injury Injury of head, initial encounter: acute illness or injury  Amount and/or Complexity of Data Reviewed Labs: ordered. Decision-making details documented in ED Course. Radiology: ordered and independent interpretation performed. Decision-making details documented in ED Course.  Risk OTC drugs. Prescription drug management. Decision regarding hospitalization.          Charlynne Pander, MD 05/12/22 [phone]

## 2022-05-12 NOTE — ED Provider Notes (Signed)
[place] EMERGENCY DEPARTMENT AT [facility] Provider Note   CSN: [id number] Arrival date & time: 05/12/22  1803     History  Chief Complaint  Patient presents with   Level 2-FOT   Marletta Lor    James Dyer is a 77 y.o. male.   Fall     77 year old with medical history significant for HTN, HLD, DM 2, atrial fibrillation on Eliquis who presents to the emergency department as a level 2 trauma after 2 falls.  The patient reportedly fell yesterday with positive LOC.  He has been unsteady on his feet ever since and has been intermittently confused.  He had difficulty driving.  He endorses left upper extremity weakness which has been present since he fell he thinks yesterday but he is unsure.  He fell again today onto a hardwood floor onto his face prior to arrival.  He sustained bruising to his bilateral eyes periorbitally from his fall yesterday.  No loss of consciousness today.  The patient is unclear as to what caused the fall.  He arrived to the emergency department GCS 14, ABC intact.  The patient states that his tetanus is up-to-date as of 1 month ago.  Home Medications Prior to Admission medications   Medication Sig Start Date End Date Taking? Authorizing Provider  amLODipine (NORVASC) 5 MG tablet Take 1 tablet (5 mg total) by mouth daily. 07/04/21 06/29/22 Yes Wendall Stade, MD  ELIQUIS 5 MG TABS tablet TAKE 1 TABLET(5 MG) BY MOUTH TWICE DAILY 02/05/22  Yes Wendall Stade, MD  ezetimibe (ZETIA) 10 MG tablet Take 10 mg by mouth daily.   Yes [provider]  glimepiride (AMARYL) 1 MG tablet Take by mouth daily with breakfast.   Yes [provider]  metoprolol tartrate (LOPRESSOR) 25 MG tablet TAKE 1 TABLET(25 MG) BY MOUTH TWICE DAILY 07/21/21  Yes Wendall Stade, MD  solifenacin (VESICARE) 10 MG tablet Take 1 tablet (10 mg total) by mouth daily. 07/11/21  Yes Dahlstedt, Jeannett Senior, MD  cholecalciferol (VITAMIN D) 25 MCG (1000 UNIT) tablet Take 1,000 Units by  mouth daily with lunch.    [provider]  fluticasone (FLONASE) 50 MCG/ACT nasal spray Place 2 sprays into both nostrils daily as needed for allergies or rhinitis.    [provider]  Lidocaine HCl (PAIN RELIEF ROLL-ON EX) Apply 1 application topically daily as needed (applied to feet if needed for pain.).    [provider]  Lidocaine HCl 4 % CREA Apply 1 application  topically daily.    [provider]  losartan (COZAAR) 100 MG tablet Take 100 mg by mouth daily.    [provider]  meclizine (ANTIVERT) 25 MG tablet Take 25 mg by mouth 2 (two) times daily as needed for dizziness. 05/04/20   [provider]  Multiple Vitamin (MULTIVITAMIN WITH MINERALS) TABS tablet Take 1 tablet by mouth daily.    [provider]  phenylephrine (SUDAFED PE) 10 MG TABS tablet Take 10 mg by mouth every 4 (four) hours as needed (congestion).    [provider]  triamcinolone cream (KENALOG) 0.1 % Apply 1 application topically 2 (two) times daily. Patient taking differently: Apply 1 application  topically 2 (two) times daily as needed (irritation). 09/13/20   Bing Neighbors, NP  Turmeric 500 MG CAPS Take 500 mg by mouth daily.    [provider]  vitamin B-12 (CYANOCOBALAMIN) 1000 MCG tablet Take 1,000 mcg by mouth every evening.  [provider]      Allergies    Latex    Review of Systems   Review of Systems  All other systems reviewed and are negative.   Physical Exam Updated Vital Signs BP (!) 151/88   Pulse 92   Temp (!) 102.5 F (39.2 C) (Rectal)   Resp (!) 25   Ht  (1.753 m)   Wt 105.6 kg   SpO2 98%   BMI 34.38 kg/m  Physical Exam Vitals and nursing note reviewed.  Constitutional:      Appearance: He is well-developed.     Comments: GCS 15, ABC intact  HENT:     Head: Normocephalic.     Comments: Bilateral periorbital ecchymoses Eyes:     Conjunctiva/sclera: Conjunctivae normal.  Neck:      Comments: No midline tenderness to palpation of the cervical spine. ROM intact. Cardiovascular:     Rate and Rhythm: Normal rate and regular rhythm.  Pulmonary:     Effort: Pulmonary effort is normal. No respiratory distress.     Breath sounds: Normal breath sounds.  Chest:     Comments: Chest wall stable and non-tender to AP and lateral compression. Clavicles stable and non-tender to AP compression Abdominal:     Palpations: Abdomen is soft.     Tenderness: There is no abdominal tenderness.     Comments: Pelvis stable to lateral compression.  Musculoskeletal:     Cervical back: Neck supple.     Comments: No midline tenderness to palpation of the thoracic or lumbar spine. Extremities atraumatic with intact ROM with the exception of an abrasion to the right knee  Skin:    General: Skin is warm and dry.  Neurological:     Mental Status: He is alert.     Comments: CN II-XII grossly intact. Moving all four extremities spontaneously and sensation grossly intact.  4-5 strength in the left upper extremity     ED Results / Procedures / Treatments   Labs (all labs ordered are listed, but only abnormal results are displayed) Labs Reviewed  COMPREHENSIVE METABOLIC PANEL - Abnormal; Notable for the following components:      Result Value   BUN 32 (*)    Creatinine, Ser 1.49 (*)    Calcium 8.3 (*)    Total Protein 6.4 (*)    AST 62 (*)    GFR, Estimated 48 (*)    All other components within normal limits  CBC - Abnormal; Notable for the following components:   Platelets 106 (*)    All other components within normal limits  PROTIME-INR - Abnormal; Notable for the following components:   Prothrombin Time 17.6 (*)    INR 1.5 (*)    All other components within normal limits  I-STAT CHEM 8, ED - Abnormal; Notable for the following components:   BUN 34 (*)    Creatinine, Ser 1.50 (*)    Glucose, Bld 67 (*)    Calcium, Ion 0.95 (*)    All other components within normal limits  CBG  MONITORING, ED - Abnormal; Notable for the following components:   Glucose-Capillary 45 (*)    All other components within normal limits  RESP PANEL BY RT-PCR (RSV, FLU A&B, COVID)  [id number]  ETHANOL  LACTIC ACID, PLASMA  URINALYSIS, ROUTINE W REFLEX MICROSCOPIC  SAMPLE TO BLOOD BANK    EKG None  Radiology DG Pelvis Portable  Result Date: 05/12/2022 CLINICAL DATA:  Trauma, fall. EXAM: PORTABLE PELVIS 1-2 VIEWS COMPARISON:  None Available. FINDINGS: The cortical margins of the bony pelvis are intact. No fracture. Pubic symphysis and sacroiliac joints are congruent. Both femoral heads are well-seated in the respective acetabula. Mild bilateral hip degenerative change. IMPRESSION: No pelvic fracture. Electronically Signed   By: Narda Rutherford M.D.   On: 05/12/2022 19:02   DG Chest Port 1 View  Result Date: 05/12/2022 CLINICAL DATA:  Trauma, fall. EXAM: PORTABLE CHEST 1 VIEW COMPARISON:  Radiograph 03/06/2013 FINDINGS: The cardiomediastinal contours are normal. Aortic arch atherosclerosis. Pulmonary vasculature is normal. No consolidation, pleural effusion, or pneumothorax. No acute osseous abnormalities are seen. IMPRESSION: No acute findings or evidence of traumatic injury. Electronically Signed   By: Narda Rutherford M.D.   On: 05/12/2022 19:01    Procedures Procedures    Medications Ordered in ED Medications  dextrose 50 % solution 50 mL (has no administration in time range)  acetaminophen (TYLENOL) suppository 650 mg (has no administration in time range)  dextrose 50 % solution (has no administration in time range)  haloperidol lactate (HALDOL) injection 2 mg (2 mg Intravenous Given 05/12/22 1947)  LORazepam (ATIVAN) injection 1 mg (1 mg Intravenous Given 05/12/22 1948)    ED Course/ Medical Decision Making/ A&P Clinical Course as of 05/12/22 2105  Sat May 12, 2022  2038 Temp(!): 102.5 F (39.2 C) [JL]    Clinical Course User Index [JL] Ernie Avena, MD                              Medical Decision Making Amount and/or Complexity of Data Reviewed Labs: ordered. Radiology: ordered.  Risk OTC drugs. Prescription drug management.    77 year old with medical history significant for HTN, HLD, DM 2, atrial fibrillation on Eliquis who presents to the emergency department as a level 2 trauma after 2 falls.  The patient reportedly fell yesterday with positive LOC.  He has been unsteady on his feet ever since and has been intermittently confused.  He had difficulty driving.  He endorses left upper extremity weakness which has been present since he fell he thinks yesterday but he is unsure.  He fell again today onto a hardwood floor onto his face prior to arrival.  He sustained bruising to his bilateral eyes periorbitally from his fall yesterday.  No loss of consciousness today.  The patient is unclear as to what caused the fall.  He arrived to the emergency department GCS 14, ABC intact.  The patient states that his tetanus is up-to-date as of 1 month ago.  On arrival, the patient was afebrile, not tachycardic or tachypneic, saturating well on room air.  Physical exam concerning for left upper extremity weakness.  Concern for head bleed in the setting of a recent fall.  Also concern for stroke.  Currently, he is awake, alert, and protecting his own airway and is hemodynamically stable.  Trauma imaging revealed (full reports in EMR): Portable CXR:  No evidence of pneumothorax or tracheal deviation Portable Pelvis:  No evidence of acute hip fracture or malalignment CT scans (pan-scan): CT head and Cervical Spine: Pending CT Face: Pending CT C/A/P: Pending   Laboratory evaluation for CBG 45 for which the patient was administered IV dextrose, CMP with elevated serum creatinine to 1.49, initial CBG 71 on CMP, mildly elevated AST to 62, INR 1.5, lactic acid 0.9, ethanol level normal, CBC without a leukocytosis or anemia.  Urinalysis pending.  Signout given to Dr. Silverio Lay  pending imaging evaluation, plan  for likely admission for further evaluation given the patient's confusion, likely will need further medical workup following initial trauma workup.  Final Clinical Impression(s) / ED Diagnoses Final diagnoses:  Fall, initial encounter  Injury of head, initial encounter  Hypoglycemia  Fever, unspecified fever cause    Rx / DC Orders ED Discharge Orders     None         Ernie Avena, MD 05/12/22 2105

## 2022-05-12 NOTE — ED Triage Notes (Signed)
Patient fell onto floor and hit face at home. Pt alert and oriented. Denies LOC. + eliquis, reports multiple falls over the past couple of days.

## 2022-05-12 NOTE — ED Triage Notes (Signed)
Pt had fall onto hardwood floor onto his face PTA.  Both eyes and bridge of nose have bruising.  Unable to remember if he passed out and fell or had a trip and fall.  Taken to room and Level 2 trauma paged out.

## 2022-05-13 ENCOUNTER — Observation Stay (HOSPITAL_COMMUNITY): Payer: Medicare Other

## 2022-05-13 DIAGNOSIS — E7800 Pure hypercholesterolemia, unspecified: Secondary | ICD-10-CM | POA: Diagnosis present

## 2022-05-13 DIAGNOSIS — S0990XA Unspecified injury of head, initial encounter: Secondary | ICD-10-CM | POA: Diagnosis present

## 2022-05-13 DIAGNOSIS — I9589 Other hypotension: Secondary | ICD-10-CM | POA: Diagnosis present

## 2022-05-13 DIAGNOSIS — N179 Acute kidney failure, unspecified: Secondary | ICD-10-CM | POA: Diagnosis present

## 2022-05-13 DIAGNOSIS — N189 Chronic kidney disease, unspecified: Secondary | ICD-10-CM | POA: Diagnosis present

## 2022-05-13 DIAGNOSIS — N281 Cyst of kidney, acquired: Secondary | ICD-10-CM | POA: Diagnosis not present

## 2022-05-13 DIAGNOSIS — W19XXXA Unspecified fall, initial encounter: Secondary | ICD-10-CM | POA: Diagnosis present

## 2022-05-13 DIAGNOSIS — R488 Other symbolic dysfunctions: Secondary | ICD-10-CM | POA: Diagnosis not present

## 2022-05-13 DIAGNOSIS — E162 Hypoglycemia, unspecified: Secondary | ICD-10-CM

## 2022-05-13 DIAGNOSIS — R748 Abnormal levels of other serum enzymes: Secondary | ICD-10-CM | POA: Diagnosis present

## 2022-05-13 DIAGNOSIS — I7121 Aneurysm of the ascending aorta, without rupture: Secondary | ICD-10-CM | POA: Diagnosis present

## 2022-05-13 DIAGNOSIS — I10 Essential (primary) hypertension: Secondary | ICD-10-CM

## 2022-05-13 DIAGNOSIS — M21372 Foot drop, left foot: Secondary | ICD-10-CM | POA: Diagnosis not present

## 2022-05-13 DIAGNOSIS — R413 Other amnesia: Secondary | ICD-10-CM | POA: Diagnosis not present

## 2022-05-13 DIAGNOSIS — Z1152 Encounter for screening for COVID-19: Secondary | ICD-10-CM | POA: Diagnosis not present

## 2022-05-13 DIAGNOSIS — E1159 Type 2 diabetes mellitus with other circulatory complications: Secondary | ICD-10-CM | POA: Diagnosis not present

## 2022-05-13 DIAGNOSIS — S0010XA Contusion of unspecified eyelid and periocular area, initial encounter: Secondary | ICD-10-CM | POA: Diagnosis present

## 2022-05-13 DIAGNOSIS — E785 Hyperlipidemia, unspecified: Secondary | ICD-10-CM | POA: Diagnosis not present

## 2022-05-13 DIAGNOSIS — Z978 Presence of other specified devices: Secondary | ICD-10-CM | POA: Diagnosis not present

## 2022-05-13 DIAGNOSIS — R531 Weakness: Secondary | ICD-10-CM | POA: Diagnosis not present

## 2022-05-13 DIAGNOSIS — G4710 Hypersomnia, unspecified: Secondary | ICD-10-CM | POA: Diagnosis present

## 2022-05-13 DIAGNOSIS — I480 Paroxysmal atrial fibrillation: Secondary | ICD-10-CM

## 2022-05-13 DIAGNOSIS — E669 Obesity, unspecified: Secondary | ICD-10-CM | POA: Diagnosis present

## 2022-05-13 DIAGNOSIS — I152 Hypertension secondary to endocrine disorders: Secondary | ICD-10-CM | POA: Diagnosis not present

## 2022-05-13 DIAGNOSIS — M21371 Foot drop, right foot: Secondary | ICD-10-CM | POA: Diagnosis present

## 2022-05-13 DIAGNOSIS — S0012XD Contusion of left eyelid and periocular area, subsequent encounter: Secondary | ICD-10-CM | POA: Diagnosis not present

## 2022-05-13 DIAGNOSIS — E11649 Type 2 diabetes mellitus with hypoglycemia without coma: Secondary | ICD-10-CM | POA: Diagnosis present

## 2022-05-13 DIAGNOSIS — Z7901 Long term (current) use of anticoagulants: Secondary | ICD-10-CM | POA: Diagnosis not present

## 2022-05-13 DIAGNOSIS — B9620 Unspecified Escherichia coli [E. coli] as the cause of diseases classified elsewhere: Secondary | ICD-10-CM | POA: Diagnosis present

## 2022-05-13 DIAGNOSIS — R296 Repeated falls: Secondary | ICD-10-CM | POA: Diagnosis present

## 2022-05-13 DIAGNOSIS — D6959 Other secondary thrombocytopenia: Secondary | ICD-10-CM | POA: Diagnosis present

## 2022-05-13 DIAGNOSIS — N390 Urinary tract infection, site not specified: Secondary | ICD-10-CM | POA: Diagnosis present

## 2022-05-13 DIAGNOSIS — M6282 Rhabdomyolysis: Secondary | ICD-10-CM | POA: Diagnosis present

## 2022-05-13 DIAGNOSIS — R509 Fever, unspecified: Secondary | ICD-10-CM

## 2022-05-13 DIAGNOSIS — G319 Degenerative disease of nervous system, unspecified: Secondary | ICD-10-CM | POA: Diagnosis not present

## 2022-05-13 DIAGNOSIS — I129 Hypertensive chronic kidney disease with stage 1 through stage 4 chronic kidney disease, or unspecified chronic kidney disease: Secondary | ICD-10-CM | POA: Diagnosis present

## 2022-05-13 DIAGNOSIS — R338 Other retention of urine: Secondary | ICD-10-CM | POA: Diagnosis not present

## 2022-05-13 DIAGNOSIS — W19XXXD Unspecified fall, subsequent encounter: Secondary | ICD-10-CM | POA: Diagnosis not present

## 2022-05-13 DIAGNOSIS — R319 Hematuria, unspecified: Secondary | ICD-10-CM | POA: Diagnosis not present

## 2022-05-13 DIAGNOSIS — Z7401 Bed confinement status: Secondary | ICD-10-CM | POA: Diagnosis not present

## 2022-05-13 DIAGNOSIS — Z9181 History of falling: Secondary | ICD-10-CM | POA: Diagnosis not present

## 2022-05-13 DIAGNOSIS — E861 Hypovolemia: Secondary | ICD-10-CM | POA: Diagnosis present

## 2022-05-13 DIAGNOSIS — R262 Difficulty in walking, not elsewhere classified: Secondary | ICD-10-CM | POA: Diagnosis not present

## 2022-05-13 DIAGNOSIS — Y92009 Unspecified place in unspecified non-institutional (private) residence as the place of occurrence of the external cause: Secondary | ICD-10-CM | POA: Diagnosis not present

## 2022-05-13 DIAGNOSIS — F03918 Unspecified dementia, unspecified severity, with other behavioral disturbance: Secondary | ICD-10-CM | POA: Diagnosis present

## 2022-05-13 DIAGNOSIS — R4182 Altered mental status, unspecified: Secondary | ICD-10-CM | POA: Diagnosis not present

## 2022-05-13 DIAGNOSIS — N3281 Overactive bladder: Secondary | ICD-10-CM | POA: Diagnosis not present

## 2022-05-13 DIAGNOSIS — C61 Malignant neoplasm of prostate: Secondary | ICD-10-CM | POA: Diagnosis not present

## 2022-05-13 DIAGNOSIS — D696 Thrombocytopenia, unspecified: Secondary | ICD-10-CM | POA: Diagnosis not present

## 2022-05-13 DIAGNOSIS — M6281 Muscle weakness (generalized): Secondary | ICD-10-CM | POA: Diagnosis not present

## 2022-05-13 DIAGNOSIS — G9341 Metabolic encephalopathy: Secondary | ICD-10-CM

## 2022-05-13 DIAGNOSIS — S0011XD Contusion of right eyelid and periocular area, subsequent encounter: Secondary | ICD-10-CM | POA: Diagnosis not present

## 2022-05-13 DIAGNOSIS — D72819 Decreased white blood cell count, unspecified: Secondary | ICD-10-CM | POA: Diagnosis present

## 2022-05-13 DIAGNOSIS — R2681 Unsteadiness on feet: Secondary | ICD-10-CM | POA: Diagnosis not present

## 2022-05-13 LAB — CBC
HCT: 38.3 % — ABNORMAL LOW (ref 39.0–52.0)
Hemoglobin: 12.6 g/dL — ABNORMAL LOW (ref 13.0–17.0)
MCH: 29.4 pg (ref 26.0–34.0)
MCHC: 32.9 g/dL (ref 30.0–36.0)
MCV: 89.5 fL (ref 80.0–100.0)
Platelets: 88 10*3/uL — ABNORMAL LOW (ref 150–400)
RBC: 4.28 MIL/uL (ref 4.22–5.81)
RDW: 13.2 % (ref 11.5–15.5)
WBC: 3.1 10*3/uL — ABNORMAL LOW (ref 4.0–10.5)
nRBC: 0 % (ref 0.0–0.2)

## 2022-05-13 LAB — MAGNESIUM: Magnesium: 1.7 mg/dL (ref 1.7–2.4)

## 2022-05-13 LAB — BASIC METABOLIC PANEL
Anion gap: 11 (ref 5–15)
BUN: 28 mg/dL — ABNORMAL HIGH (ref 8–23)
CO2: 21 mmol/L — ABNORMAL LOW (ref 22–32)
Calcium: 7.4 mg/dL — ABNORMAL LOW (ref 8.9–10.3)
Chloride: 102 mmol/L (ref 98–111)
Creatinine, Ser: 1.24 mg/dL (ref 0.61–1.24)
GFR, Estimated: >60 mL/min (ref >60)
Glucose, Bld: 78 mg/dL (ref 70–99)
Potassium: 3.3 mmol/L — ABNORMAL LOW (ref 3.5–5.1)
Sodium: 134 mmol/L — ABNORMAL LOW (ref 135–145)

## 2022-05-13 LAB — CBG MONITORING, ED
Glucose-Capillary: 57 mg/dL — ABNORMAL LOW (ref 70–99)
Glucose-Capillary: 68 mg/dL — ABNORMAL LOW (ref 70–99)

## 2022-05-13 LAB — PROTIME-INR
INR: 1.4 — ABNORMAL HIGH (ref 0.8–1.2)
Prothrombin Time: 17.1 seconds — ABNORMAL HIGH (ref 11.4–15.2)

## 2022-05-13 LAB — CK: Total CK: 381 U/L (ref 49–397)

## 2022-05-13 LAB — GLUCOSE, CAPILLARY
Glucose-Capillary: 98 mg/dL (ref 70–99)
Glucose-Capillary: 110 mg/dL — ABNORMAL HIGH (ref 70–99)

## 2022-05-13 LAB — CULTURE, BLOOD (ROUTINE X 2)

## 2022-05-13 MED ORDER — DEXTROSE 50 % IV SOLN
1.0000 | Freq: Once | INTRAVENOUS | Status: AC
  Administered 2022-05-13: 50 mL via INTRAVENOUS
  Filled 2022-05-13: qty 50

## 2022-05-13 MED ORDER — DEXTROSE-NACL 5-0.9 % IV SOLN: INTRAVENOUS | Status: AC

## 2022-05-13 MED ORDER — PHENOL 1.4 % MT LIQD
1.0000 | OROMUCOSAL | Status: DC | PRN
  Administered 2022-05-13: 1 via OROMUCOSAL
  Filled 2022-05-13: qty 177

## 2022-05-13 MED ORDER — POTASSIUM CHLORIDE 10 MEQ/100ML IV SOLN
10.0000 meq | INTRAVENOUS | Status: AC
  Administered 2022-05-13 (×3): 10 meq via INTRAVENOUS
  Filled 2022-05-13 (×3): qty 100

## 2022-05-13 MED ORDER — DEXTROSE-NACL 5-0.9 % IV SOLN: INTRAVENOUS | Status: DC

## 2022-05-13 NOTE — ED Notes (Signed)
Pt report received from previous nurse. Pt resting, rise/fall of chest noted, vitals stable. Call bell in reach. No acute distress noted. Family @ bedside

## 2022-05-13 NOTE — ED Notes (Signed)
Pt medicated for hypoglycemia, per protocol. Spoke with Dr. Eustace Quail about pt, stated to medicate then recheck sugar. Pt in no acute distress, resting. Rise/fall of chest noted. call bell in reach, son @ bedside.

## 2022-05-13 NOTE — Plan of Care (Signed)

## 2022-05-13 NOTE — Progress Notes (Signed)
PROGRESS NOTE    James Dyer  QDI:[id number] DOB: Mar 14, 1945 DOA: 05/12/2022 PCP: [name], MD    Chief Complaint  Patient presents with   Level 2-FOT   Fall    Brief Narrative:  James Dyer is a 77 y.o. male with medical history significant for PAF on Eliquis, T2DM, HTN, HLD, chronic bilateral foot drop, history of prostate cancer who is admitted with AKI and altered mental status after falling on his face at home.     Assessment & Plan:   Principal Problem:   Acute kidney injury Active Problems:   HTN (hypertension)   Type 2 diabetes mellitus with hypoglycemia   Paroxysmal atrial fibrillation   Elevated CK   Fall at home, initial encounter   Altered mental status   Thrombocytopenia   Fever   AKI (acute kidney injury)   Acute metabolic encephalopathy   Hypoglycemia   Fall  #1 acute kidney injury -Felt likely secondary to prerenal azotemia as patient noted to be volume depleted on examination in the setting of fall. -Creatinine on admission was 1.49, baseline approximately 1.1, CK at 419. -Renal function improving with hydration.  CK levels trending down. -Urinalysis nitrite negative leukocytes -100 protein. -Continue hydration for another 24 hours. -Repeat labs in the AM.  2.  Acute metabolic encephalopathy -Patient noted to have reported some mental status changes after fall on his face at home. -Likely secondary to hypoglycemia (per family CBGs noted in the 30s on initial presentation to the ED) versus postconcussive syndrome. -Head CT negative for any acute changes. -MRI brain negative for any acute abnormalities. -Mental status has improved. -Follow-up.  3.  Fall at home/chronic bilateral foot drop -Patient noted to have had multiple falls at home resulting in ecchymosis periorbitally. -Patient underwent extensive imaging on admission with no significant fracture or traumatic injury noted. -Unclear circumstances of fall however patient noted  to have CBGs in the 30s on presentation per son. -Chronic foot drop and increasing risk of fall. -MRI brain done negative for any acute abnormalities. -PT/OT. -Fall precautions.  4.  Type 2 diabetes mellitus with hypoglycemic episodes -Per son patient noted to be hypoglycemic with CBGs in the 30s on presentation to the ED. -Hypoglycemia likely contributed to acute encephalopathy and falls. -CBG recorded on chart on presentation was 45. -Patient received D50 amp with some improvement with blood glucose levels. -Patient received another amp of D50. -IV fluids changed to D5 normal saline. -Continue to hold home regimen Amaryl and likely will not resume on discharge. -Check a hemoglobin A1c. -Check CBGs every 4 hours for the next 1 to 2 days. -Supportive care.  5.  Fever -Patient noted to have a Tmax of 102.5 rectally while in the ED. -Urinalysis negative for UTI. -Extensive imaging done negative for any obvious infection. -Patient with known leukocytosis noted. -COVID-19 PCR negative, RSV PCR negative, influenza A and B PCR negative. -Blood cultures ordered and pending. -Monitor off antibiotics.  6.  Paroxysmal atrial fibrillation -Continue Lopressor for rate control. -Eliquis on hold due to new thrombocytopenia and periorbital ecchymosis. -May resume Eliquis once platelet count is stabilized and not dropping further.  7.  Thrombocytopenia -Eliquis on hold. -Repeat labs in the AM.  8.  Ascending aortic aneurysm -Patient with a 4.1 cm aneurysmal dilatation of the ascending aorta noted on CT chest. -Annual imaging followed by CTA or MRI recommended. -Findings were discussed with patient and son. -Will need outpatient follow-up with vascular surgery.  9.  Hypokalemia -Replete.  DVT prophylaxis: SCDs Code Status: Full Family Communication: Updated patient and family at bedside. Disposition: TBD  Status is: Inpatient Remains inpatient appropriate because: Severity of  illness.   Consultants:  None  Procedures:  CT head 05/12/2022 CT maxillofacial 05/12/2022 CT C-spine 05/12/2022 CT chest abdomen and pelvis 05/12/2022 Chest x-ray 05/12/2022 Plain films of the pelvis 05/12/2022 MRI brain 05/13/2022  Antimicrobials:  None   Subjective: Patient laying in bed.  Son at bedside.  Denies any chest pain, no shortness of breath, no abdominal pain.  Denies any significant prodrome prior to fall.  Per son patient noted on presentation to have CBGs in the 30s.  Objective: Vitals:   05/13/22 0800 05/13/22 0848 05/13/22 1100 05/13/22 1145  BP: 130/82 130/82 (!) 144/81   Pulse: 86 86 84   Resp: (!) 28  (!) 33   Temp:    99.6 F (37.6 C)  TempSrc:    Oral  SpO2: 95%  98%   Weight:      Height:        Intake/Output Summary (Last 24 hours) at 05/13/2022 1816 Last data filed at 05/13/2022 1806 Gross per 24 hour  Intake 1240 ml  Output --  Net 1240 ml   Filed Weights   05/12/22 1818 05/12/22 2003  Weight: 105.7 kg 105.6 kg    Examination:  General exam: Appears calm and comfortable.  Periorbital ecchymosis.  Respiratory system: Clear to auscultation.  No wheezes, no crackles, no rhonchi.  Respiratory effort normal. Cardiovascular system: Irregularly irregular.  No murmurs rubs or gallops.  No lower extremity edema.   Gastrointestinal system: Abdomen is nondistended, soft and nontender. No organomegaly or masses felt. Normal bowel sounds heard. Central nervous system: Alert and oriented. No focal neurological deficits. Extremities: Symmetric 5 x 5 power. Skin: No rashes, lesions or ulcers Psychiatry: Judgement and insight appear normal. Mood & affect appropriate.     Data Reviewed: I have personally reviewed following labs and imaging studies  CBC: Recent Labs  Lab 05/12/22 1820 05/12/22 1830 05/13/22 0030  WBC 4.0  --  3.1*  HGB 14.3 14.3 12.6*  HCT 43.1 42.0 38.3*  MCV 88.5  --  89.5  PLT 106*  --  88*    Basic Metabolic  Panel: Recent Labs  Lab 05/12/22 1820 05/12/22 1830 05/13/22 0030  NA 135 136 134*  K 3.6 3.5 3.3*  CL 101 100 102  CO2 22  --  21*  GLUCOSE 71 67* 78  BUN 32* 34* 28*  CREATININE 1.49* 1.50* 1.24  CALCIUM 8.3*  --  7.4*  MG  --   --  1.7    GFR: Estimated Creatinine Clearance: 60.7 mL/min (by C-G formula based on SCr of 1.24 mg/dL).  Liver Function Tests: Recent Labs  Lab 05/12/22 1820  AST 62*  ALT 41  ALKPHOS 49  BILITOT 0.8  PROT 6.4*  ALBUMIN 3.6    CBG: Recent Labs  Lab 05/12/22 2116 05/12/22 2327 05/13/22 0410 05/13/22 0737 05/13/22 1520  GLUCAP 178* 84 57* 68* 98     Recent Results (from the past 240 hour(s))  Resp panel by RT-PCR (RSV, Flu A&B, Covid) Anterior Nasal Swab     Status: None   Collection Time: 05/12/22  9:00 PM   Specimen: Anterior Nasal Swab  Result Value Ref Range Status   SARS Coronavirus 2 by RT PCR NEGATIVE NEGATIVE Final   Influenza A by PCR NEGATIVE NEGATIVE Final   Influenza B by PCR NEGATIVE NEGATIVE Final  Comment: (NOTE) The Xpert Xpress SARS-CoV-2/FLU/RSV plus assay is intended as an aid in the diagnosis of influenza from Nasopharyngeal swab specimens and should not be used as a sole basis for treatment. Nasal washings and aspirates are unacceptable for Xpert Xpress SARS-CoV-2/FLU/RSV testing.  Fact Sheet for Patients: BloggerCourse.com  Fact Sheet for Healthcare Providers: SeriousBroker.it  This test is not yet approved or cleared by the [place] FDA and has been authorized for detection and/or diagnosis of SARS-CoV-2 by FDA under an Emergency Use Authorization (EUA). This EUA will remain in effect (meaning this test can be used) for the duration of the COVID-19 declaration under Section 564(b)(1) of the Act, 21 U.S.C. section 360bbb-3(b)(1), unless the authorization is terminated or revoked.     Resp Syncytial Virus by PCR NEGATIVE NEGATIVE Final     Comment: (NOTE) Fact Sheet for Patients: BloggerCourse.com  Fact Sheet for Healthcare Providers: SeriousBroker.it  This test is not yet approved or cleared by the [place] FDA and has been authorized for detection and/or diagnosis of SARS-CoV-2 by FDA under an Emergency Use Authorization (EUA). This EUA will remain in effect (meaning this test can be used) for the duration of the COVID-19 declaration under Section 564(b)(1) of the Act, 21 U.S.C. section 360bbb-3(b)(1), unless the authorization is terminated or revoked.  Performed at [facility], [address], [place], [place] [address]   Blood culture (routine x 2)     Status: None (Preliminary result)   Collection Time: 05/12/22 11:32 PM   Specimen: BLOOD  Result Value Ref Range Status   Specimen Description BLOOD SITE NOT SPECIFIED  Final   Special Requests   Final    BOTTLES DRAWN AEROBIC AND ANAEROBIC Blood Culture results may not be optimal due to an excessive volume of blood received in culture bottles   Culture   Final    NO GROWTH < 12 HOURS Performed at [facility], [address], [place], [place] [address]    Report Status PENDING  Incomplete  Blood culture (routine x 2)     Status: None (Preliminary result)   Collection Time: 05/12/22 11:32 PM   Specimen: BLOOD  Result Value Ref Range Status   Specimen Description BLOOD SITE NOT SPECIFIED  Final   Special Requests   Final    BOTTLES DRAWN AEROBIC AND ANAEROBIC Blood Culture results may not be optimal due to an excessive volume of blood received in culture bottles   Culture   Final    NO GROWTH < 12 HOURS Performed at [facility], [address], [place], [place] [address]    Report Status PENDING  Incomplete         Radiology Studies: MR BRAIN WO CONTRAST  Result Date: 05/13/2022 CLINICAL DATA:  Initial evaluation for mental status change, unknown cause. Recent falls. EXAM: MRI  HEAD WITHOUT CONTRAST TECHNIQUE: Multiplanar, multiecho pulse sequences of the brain and surrounding structures were obtained without intravenous contrast. COMPARISON:  Prior CTs from 05/12/2022. FINDINGS: Brain: Examination degraded by motion artifact. Diffuse prominence of the CSF containing spaces compatible generalized cerebral atrophy. Patchy and confluent T2/FLAIR hyperdensity involving the periventricular deep white matter both cerebral hemispheres, consistent with chronic small vessel ischemic disease, moderate to advanced in nature. No abnormal foci of restricted diffusion to suggest acute or subacute ischemia. Gray-white matter differentiation maintained. No areas of chronic cortical infarction. No acute intracranial hemorrhage. Few punctate chronic micro hemorrhages noted about the right cerebellum and occipital regions, likely hypertensive  and/or small vessel related. No mass lesion, midline shift or mass effect. Mild ventricular prominence related global parenchymal volume loss without hydrocephalus. No extra-axial fluid collection. Pituitary gland and suprasellar region within normal limits. Vascular: Major intracranial vascular flow voids are maintained. Skull and upper cervical spine: Craniocervical junction within normal limits. Signal changes at the dens related to a prominent geode formation noted, also seen on prior CT. Underlying bone marrow signal intensity within normal limits. Evolving right frontal scalp contusion/hematoma. Sinuses/Orbits: Globes and orbital soft tissues demonstrate no acute finding. Mild scattered mucoperiosteal thickening present throughout the paranasal sinuses. Trace left mastoid effusion, of doubtful significance. Other: None. IMPRESSION: 1. No acute intracranial abnormality. 2. Evolving right frontal scalp contusion/hematoma. 3. Age-related cerebral atrophy with moderate to advanced chronic microvascular ischemic disease. Electronically Signed   By: Rise Mu M.D.   On: 05/13/2022 03:55   CT HEAD WO CONTRAST  Result Date: 05/12/2022 CLINICAL DATA:  Head trauma, moderate-severe; Facial trauma, blunt; Neck trauma (Age >= 65y) EXAM: CT HEAD WITHOUT CONTRAST CT MAXILLOFACIAL WITHOUT CONTRAST CT CERVICAL SPINE WITHOUT CONTRAST TECHNIQUE: Multidetector CT imaging of the head, cervical spine, and maxillofacial structures were performed using the standard protocol without intravenous contrast. Multiplanar CT image reconstructions of the cervical spine and maxillofacial structures were also generated. RADIATION DOSE REDUCTION: This exam was performed according to the departmental dose-optimization program which includes automated exposure control, adjustment of the mA and/or kV according to patient size and/or use of iterative reconstruction technique. COMPARISON:  CT head 09/25/2021 FINDINGS: CT HEAD FINDINGS Brain: Cerebral ventricle sizes are concordant with the degree of cerebral volume loss. Patchy and confluent areas of decreased attenuation are noted throughout the deep and periventricular white matter of the cerebral hemispheres bilaterally, compatible with chronic microvascular ischemic disease. No evidence of large-territorial acute infarction. No parenchymal hemorrhage. No mass lesion. No extra-axial collection. No mass effect or midline shift. No hydrocephalus. Basilar cisterns are patent. Vascular: No hyperdense vessel. Atherosclerotic calcifications are present within the cavernous internal carotid and vertebral arteries. Skull: No acute fracture or focal lesion. Other: Right frontal scalp hematoma formation. CT MAXILLOFACIAL FINDINGS Osseous: No fracture or mandibular dislocation. No destructive process. Sinuses/Orbits: Paranasal sinuses and mastoid air cells are clear. The orbits are unremarkable. Soft tissues: Negative. CT CERVICAL SPINE FINDINGS Alignment: Normal. Skull base and vertebrae: Severe multilevel degenerative changes spine most  prominent at the C5-C6 level. Multilevel moderate to severe osseous neural foraminal stenosis. No severe osseous central canal stenosis. No acute fracture. No aggressive appearing focal osseous lesion or focal pathologic process. Soft tissues and spinal canal: No prevertebral fluid or swelling. No visible canal hematoma. Upper chest: Interlobular septal wall thickening. Other: None. IMPRESSION: 1. No acute intracranial abnormality. 2.  No acute displaced facial fracture. 3. No acute displaced fracture or traumatic listhesis of the cervical spine. 4. Likely mild pulmonary edema. Electronically Signed   By: Tish Frederickson M.D.   On: 05/12/2022 21:38   CT Cervical Spine Wo Contrast  Result Date: 05/12/2022 CLINICAL DATA:  Head trauma, moderate-severe; Facial trauma, blunt; Neck trauma (Age >= 65y) EXAM: CT HEAD WITHOUT CONTRAST CT MAXILLOFACIAL WITHOUT CONTRAST CT CERVICAL SPINE WITHOUT CONTRAST TECHNIQUE: Multidetector CT imaging of the head, cervical spine, and maxillofacial structures were performed using the standard protocol without intravenous contrast. Multiplanar CT image reconstructions of the cervical spine and maxillofacial structures were also generated. RADIATION DOSE REDUCTION: This exam was performed according to the departmental dose-optimization program which includes automated exposure control, adjustment of the mA  and/or kV according to patient size and/or use of iterative reconstruction technique. COMPARISON:  CT head 09/25/2021 FINDINGS: CT HEAD FINDINGS Brain: Cerebral ventricle sizes are concordant with the degree of cerebral volume loss. Patchy and confluent areas of decreased attenuation are noted throughout the deep and periventricular white matter of the cerebral hemispheres bilaterally, compatible with chronic microvascular ischemic disease. No evidence of large-territorial acute infarction. No parenchymal hemorrhage. No mass lesion. No extra-axial collection. No mass effect or midline  shift. No hydrocephalus. Basilar cisterns are patent. Vascular: No hyperdense vessel. Atherosclerotic calcifications are present within the cavernous internal carotid and vertebral arteries. Skull: No acute fracture or focal lesion. Other: Right frontal scalp hematoma formation. CT MAXILLOFACIAL FINDINGS Osseous: No fracture or mandibular dislocation. No destructive process. Sinuses/Orbits: Paranasal sinuses and mastoid air cells are clear. The orbits are unremarkable. Soft tissues: Negative. CT CERVICAL SPINE FINDINGS Alignment: Normal. Skull base and vertebrae: Severe multilevel degenerative changes spine most prominent at the C5-C6 level. Multilevel moderate to severe osseous neural foraminal stenosis. No severe osseous central canal stenosis. No acute fracture. No aggressive appearing focal osseous lesion or focal pathologic process. Soft tissues and spinal canal: No prevertebral fluid or swelling. No visible canal hematoma. Upper chest: Interlobular septal wall thickening. Other: None. IMPRESSION: 1. No acute intracranial abnormality. 2.  No acute displaced facial fracture. 3. No acute displaced fracture or traumatic listhesis of the cervical spine. 4. Likely mild pulmonary edema. Electronically Signed   By: Tish Frederickson M.D.   On: 05/12/2022 21:38   CT Maxillofacial Wo Contrast  Result Date: 05/12/2022 CLINICAL DATA:  Head trauma, moderate-severe; Facial trauma, blunt; Neck trauma (Age >= 65y) EXAM: CT HEAD WITHOUT CONTRAST CT MAXILLOFACIAL WITHOUT CONTRAST CT CERVICAL SPINE WITHOUT CONTRAST TECHNIQUE: Multidetector CT imaging of the head, cervical spine, and maxillofacial structures were performed using the standard protocol without intravenous contrast. Multiplanar CT image reconstructions of the cervical spine and maxillofacial structures were also generated. RADIATION DOSE REDUCTION: This exam was performed according to the departmental dose-optimization program which includes automated exposure  control, adjustment of the mA and/or kV according to patient size and/or use of iterative reconstruction technique. COMPARISON:  CT head 09/25/2021 FINDINGS: CT HEAD FINDINGS Brain: Cerebral ventricle sizes are concordant with the degree of cerebral volume loss. Patchy and confluent areas of decreased attenuation are noted throughout the deep and periventricular white matter of the cerebral hemispheres bilaterally, compatible with chronic microvascular ischemic disease. No evidence of large-territorial acute infarction. No parenchymal hemorrhage. No mass lesion. No extra-axial collection. No mass effect or midline shift. No hydrocephalus. Basilar cisterns are patent. Vascular: No hyperdense vessel. Atherosclerotic calcifications are present within the cavernous internal carotid and vertebral arteries. Skull: No acute fracture or focal lesion. Other: Right frontal scalp hematoma formation. CT MAXILLOFACIAL FINDINGS Osseous: No fracture or mandibular dislocation. No destructive process. Sinuses/Orbits: Paranasal sinuses and mastoid air cells are clear. The orbits are unremarkable. Soft tissues: Negative. CT CERVICAL SPINE FINDINGS Alignment: Normal. Skull base and vertebrae: Severe multilevel degenerative changes spine most prominent at the C5-C6 level. Multilevel moderate to severe osseous neural foraminal stenosis. No severe osseous central canal stenosis. No acute fracture. No aggressive appearing focal osseous lesion or focal pathologic process. Soft tissues and spinal canal: No prevertebral fluid or swelling. No visible canal hematoma. Upper chest: Interlobular septal wall thickening. Other: None. IMPRESSION: 1. No acute intracranial abnormality. 2.  No acute displaced facial fracture. 3. No acute displaced fracture or traumatic listhesis of the cervical spine. 4. Likely mild pulmonary  edema. Electronically Signed   By: Tish Frederickson M.D.   On: 05/12/2022 21:38   CT CHEST ABDOMEN PELVIS W CONTRAST  Result  Date: 05/12/2022 CLINICAL DATA:  Polytrauma, blunt.  Fall. EXAM: CT CHEST, ABDOMEN, AND PELVIS WITH CONTRAST TECHNIQUE: Multidetector CT imaging of the chest, abdomen and pelvis was performed following the standard protocol during bolus administration of intravenous contrast. RADIATION DOSE REDUCTION: This exam was performed according to the departmental dose-optimization program which includes automated exposure control, adjustment of the mA and/or kV according to patient size and/or use of iterative reconstruction technique. CONTRAST:  75mL OMNIPAQUE IOHEXOL 350 MG/ML SOLN COMPARISON:  01/18/2017. FINDINGS: CT CHEST FINDINGS Cardiovascular: Heart is enlarged and there is no pericardial effusion. Coronary artery calcifications are noted. 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 The pulmonary trunk is normal in caliber. Mediastinum/Nodes: No enlarged mediastinal, hilar, or axillary lymph nodes. Thyroid gland, trachea, and esophagus demonstrate no significant findings. Lungs/Pleura: Atelectasis is present bilaterally. No effusion or pneumothorax. There is a subpleural nodule in the right middle lobe measuring 6 mm, axial image 88, unchanged from 2018 and likely benign. Musculoskeletal: Degenerative changes are present in the thoracic spine. No acute fracture. CT ABDOMEN PELVIS FINDINGS Hepatobiliary: No focal liver abnormality is seen. No gallstones, gallbladder wall thickening, or biliary dilatation. Pancreas: Unremarkable. No pancreatic ductal dilatation or surrounding inflammatory changes. Spleen: No splenic injury or perisplenic hematoma. Adrenals/Urinary Tract: The adrenal glands are within normal limits. Multiple cysts are present within the kidneys. No renal calculus or hydronephrosis. Bladder is unremarkable. Stomach/Bowel: Stomach is within normal limits. Appendix appears normal. No evidence of bowel wall thickening, distention,  or inflammatory changes. No free air or pneumatosis. Vascular/Lymphatic: Aortic atherosclerosis. No enlarged abdominal or pelvic lymph nodes. Reproductive: Prostate is normal in size. Brachytherapy seeds are noted. Other: No abdominopelvic ascites. Musculoskeletal: Degenerative changes in the lumbar spine. No acute osseous abnormality. IMPRESSION: 1. No evidence of acute fracture or solid organ injury. 2. 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 Recommend annual imaging followup by CTA or MRA. This recommendation follows 2010 ACCF/AHA/AATS/ACR/ASA/SCA/SCAI/SIR/STS/SVM Guidelines for the Diagnosis and Management of Patients with Thoracic Aortic Disease. Circulation. 2010; 121: Y865-H846. Aortic aneurysm NOS (ICD10-I71.9) 3. Coronary artery calcifications. Electronically Signed   By: Thornell Sartorius M.D.   On: 05/12/2022 21:21   DG Pelvis Portable  Result Date: 05/12/2022 CLINICAL DATA:  Trauma, fall. EXAM: PORTABLE PELVIS 1-2 VIEWS COMPARISON:  None Available. FINDINGS: The cortical margins of the bony pelvis are intact. No fracture. Pubic symphysis and sacroiliac joints are congruent. Both femoral heads are well-seated in the respective acetabula. Mild bilateral hip degenerative change. IMPRESSION: No pelvic fracture. Electronically Signed   By: Narda Rutherford M.D.   On: 05/12/2022 19:02   DG Chest Port 1 View  Result Date: 05/12/2022 CLINICAL DATA:  Trauma, fall. EXAM: PORTABLE CHEST 1 VIEW COMPARISON:  Radiograph 03/06/2013 FINDINGS: The cardiomediastinal contours are normal. Aortic arch atherosclerosis. Pulmonary vasculature is normal. No consolidation, pleural effusion, or pneumothorax. No acute osseous abnormalities are seen. IMPRESSION: No acute findings or evidence of traumatic injury. Electronically Signed   By: Narda Rutherford M.D.   On: 05/12/2022 19:01        Scheduled Meds:  metoprolol tartrate  25 mg Oral BID   sodium chloride flush  3  mL Intravenous Q12H   Continuous Infusions:  dextrose 5 % and 0.9% NaCl 100 mL/hr at 05/13/22 0850  LOS: 0 days    Time spent: 40 minutes    Ramiro Harvest, MD [facility]   To contact the attending provider between 7A-7P or the covering provider during after hours 7P-7A, please log into the web site [email or web address] and access using universal [place] password for that web site. If you do not have the password, please call the hospital operator.  05/13/2022, 6:16 PM

## 2022-05-13 NOTE — Evaluation (Signed)
Physical Therapy Evaluation Patient Details Name: James Dyer MRN: [id number] DOB: 10-05-1945 Today's Date: 05/13/2022  History of Present Illness  Pt is a 77 y.o. male who presented 05/12/22 s/p fall with AMS. Imaging showed no acute intracranial abnormality, no acute displaced facial fracture, no acute displaced fracture or traumatic listhesis of the C-spine. CT chest/abdomen/pelvis with contrast negative for acute fracture or solid organ injury. Admitted with AKI and hypoglycemia. PMH: PAF on Eliquis, T2DM, HTN, HLD, chronic bilateral foot drop, history of prostate cancer   Clinical Impression  Pt presents with condition above and deficits mentioned below, see PT Problem List. PTA, he was mod I with AFOs donned to mobilize without AD. Pt resides with his wife in a 1-level house with 1-2 STE. Currently, pt is demonstrating deficits in gross strength (fairly symmetrical in bil legs), cognition, balance, power, and activity tolerance. Pt is currently requiring modA for bed mobility and maxA to scoot along EOB. RN requesting hold off on standing/OOB mobility this date. PT requested pt's son bring pt's AFOs for future sessions. Pt has a tendency to lean posteriorly, needing UE support and assistance to maintain static sitting balance at EOB this date. As pt has had a drastic functional decline, is motivated to participate, and has good rehab potential he could greatly benefit from intensive inpatient rehab, >3 hours/day, with a goal to get him back to functioning at a mod I level. Will continue to follow acutely.     Recommendations for follow up therapy are one component of a multi-disciplinary discharge planning process, led by the attending physician.  Recommendations may be updated based on patient status, additional functional criteria and insurance authorization.  Follow Up Recommendations       Assistance Recommended at Discharge Frequent or constant Supervision/Assistance  Patient can  return home with the following  Two people to help with walking and/or transfers;A lot of help with bathing/dressing/bathroom;Assistance with cooking/housework;Direct supervision/assist for financial management;Direct supervision/assist for medications management;Assist for transportation;Help with stairs or ramp for entrance    Equipment Recommendations BSC/3in1;Wheelchair (measurements PT);Wheelchair cushion (measurements PT);Hospital bed (pending progress)  Recommendations for Other Services  Rehab consult    Functional Status Assessment Patient has had a recent decline in their functional status and demonstrates the ability to make significant improvements in function in a reasonable and predictable amount of time.     Precautions / Restrictions Precautions Precautions: Fall;Other (comment) Precaution Comments: watch vitals; bil foot drop with braces at baseline (son plans to bring braces) Restrictions Weight Bearing Restrictions: No      Mobility  Bed Mobility Overal bed mobility: Needs Assistance Bed Mobility: Supine to Sit, Sit to Supine     Supine to sit: Mod assist, HOB elevated Sit to supine: Mod assist, HOB elevated   General bed mobility comments: Pt able to transition legs off R EOB with minA and begin scooting his hips towards EOB, but needed heavy modA to ascend his trunk to sit up using bed rail with HOB elevated. ModA to lift legs and direct trunk back to supine. MinA to scoot superiorly in bed with tactile cues to bridge and bed in trendelenburg position.    Transfers Overall transfer level: Needs assistance Equipment used: None              Lateral/Scoot Transfers: Max assist General transfer comment: Pt cued to scoot along EOB to R towards HOB, needing cues and assistance for feet and hand placement, cuing pt to lean anteriorly towards therapist to lift  buttocks, maxA, x >4 reps. RN requesting hold on standing today    Ambulation/Gait                General Gait Details: unable this date  Stairs            Wheelchair Mobility    Modified Rankin (Stroke Patients Only) Modified Rankin (Stroke Patients Only) Pre-Morbid Rankin Score: No symptoms Modified Rankin: Severe disability     Balance Overall balance assessment: Needs assistance Sitting-balance support: Single extremity supported, Bilateral upper extremity supported, Feet supported Sitting balance-Leahy Scale: Poor Sitting balance - Comments: Reliant on 1-2 UE support and modA initially but faded to min guard-minA, posterior lean noted Postural control: Posterior lean     Standing balance comment: RN requesting hold on standing this date                             Pertinent Vitals/Pain Pain Assessment Pain Assessment: Faces Faces Pain Scale: Hurts little more Pain Location: generalized with mobility Pain Descriptors / Indicators: Discomfort, Grimacing Pain Intervention(s): Limited activity within patient's tolerance, Monitored during session, Repositioned    Home Living Family/patient expects to be discharged to:: Private residence Living Arrangements: Spouse/significant other Available Help at Discharge: Family;Available PRN/intermittently Type of Home: House Home Access: Stairs to enter Entrance Stairs-Rails: Right;None (1 in back with no rail, 2 in front with R rail) Entrance Stairs-Number of Steps: 1-2 (1 in back with no rail, 2 in front with R rail)   Home Layout: One level Home Equipment: Agricultural consultant (2 wheels);Cane - single point Additional Comments: has x3 dogs    Prior Function Prior Level of Function : Independent/Modified Independent;Driving;History of Falls (last six months)             Mobility Comments: No AD, wears bil AFOs and has hx of falls on uneven surfaces, like in the woods, due to limited ankle ROM ADLs Comments: Manages own meds and finances, dresses and bathes himself     Hand Dominance         Extremity/Trunk Assessment   Upper Extremity Assessment Upper Extremity Assessment: Defer to OT evaluation    Lower Extremity Assessment Lower Extremity Assessment: Generalized weakness;RLE deficits/detail;LLE deficits/detail RLE Deficits / Details: MMT scores of 4+ hip flexion, 5 knee extension; hx of bil foot drop; denied numbness/tingling bil LLE Deficits / Details: MMT scores of 4+ hip flexion, 5 knee extension; hx of bil foot drop; denied numbness/tingling bil    Cervical / Trunk Assessment Cervical / Trunk Assessment: Normal  Communication   Communication: No difficulties  Cognition Arousal/Alertness: Awake/alert Behavior During Therapy: Flat affect Overall Cognitive Status: Impaired/Different from baseline Area of Impairment: Attention, Following commands, Safety/judgement, Problem solving, Awareness                   Current Attention Level: Sustained   Following Commands: Follows one step commands consistently, Follows one step commands with increased time Safety/Judgement: Decreased awareness of safety, Decreased awareness of deficits Awareness: Emergent Problem Solving: Slow processing, Decreased initiation, Difficulty sequencing, Requires verbal cues, Requires tactile cues General Comments: Pt slow to process cues, needing extra time and repeated multi-modal cues. Poor problem-solving, needing cues to sequence functional mobility        General Comments General comments (skin integrity, edema, etc.): Unreliable pleth, reading in 60s% but rebounded to 90s% with good pleth on supplemental O2; son and brother present and supportive    Exercises  Assessment/Plan    PT Assessment Patient needs continued PT services  PT Problem List Decreased strength;Decreased activity tolerance;Decreased balance;Decreased mobility;Decreased cognition       PT Treatment Interventions DME instruction;Gait training;Functional mobility training;Therapeutic  activities;Therapeutic exercise;Stair training;Balance training;Cognitive remediation;Neuromuscular re-education;Patient/family education    PT Goals (Current goals can be found in the Care Plan section)  Acute Rehab PT Goals Patient Stated Goal: did not state; family wishes for pt to improve PT Goal Formulation: With patient/family Time For Goal Achievement: 05/27/22 Potential to Achieve Goals: Good    Frequency Min 1X/week     Co-evaluation               AM-PAC PT "6 Clicks" Mobility  Outcome Measure Help needed turning from your back to your side while in a flat bed without using bedrails?: A Lot Help needed moving from lying on your back to sitting on the side of a flat bed without using bedrails?: A Lot Help needed moving to and from a bed to a chair (including a wheelchair)?: Total Help needed standing up from a chair using your arms (e.g., wheelchair or bedside chair)?: Total Help needed to walk in hospital room?: Total Help needed climbing 3-5 steps with a railing? : Total 6 Click Score: 8    End of Session Equipment Utilized During Treatment: Oxygen Activity Tolerance: Patient tolerated treatment well Patient left: in bed;with call bell/phone within reach;with bed alarm set;with family/visitor present Nurse Communication: Mobility status PT Visit Diagnosis: Unsteadiness on feet (R26.81);Muscle weakness (generalized) (M62.81);History of falling (Z91.81);Repeated falls (R29.6);Difficulty in walking, not elsewhere classified (R26.2)    Time: 1884-1660 PT Time Calculation (min) (ACUTE ONLY): 27 min   Charges:   PT Evaluation $PT Eval Moderate Complexity: 1 Mod PT Treatments $Therapeutic Activity: 8-22 mins        Raymond Gurney, PT, DPT Acute Rehabilitation Services  Office: [phone]   Jewel Baize 05/13/2022, 3:36 PM

## 2022-05-13 NOTE — Progress Notes (Signed)
Inpatient Rehab Admissions Coordinator:   Per therapy recommendations, patient was screened for CIR candidacy by Zalan Shidler, MS, CCC-SLP. At this time, Pt. does not appear to demonstrate medical necessity to justify in hospital rehabilitation/CIR. will not pursue a rehab consult for this Pt.   Recommend other rehab venues to be pursued.  Please contact me with any questions.  Vraj Denardo, MS, CCC-SLP Rehab Admissions Coordinator  [phone] (celll) [phone] (office)   

## 2022-05-13 NOTE — ED Notes (Signed)
ED TO INPATIENT HANDOFF REPORT  ED Nurse Name and Phone #: [phone]  S Name/Age/Gender James Dyer 77 y.o. male Room/Bed: 021C/021C  Code Status   Code Status: Full Code  Home/SNF/Other Home Patient oriented to: self, place, time, and situation Is this baseline? Yes   Triage Complete: Triage complete  Chief Complaint Acute kidney injury [N17.9]  Triage Note Pt had fall onto hardwood floor onto his face PTA.  Both eyes and bridge of nose have bruising.  Unable to remember if he passed out and fell or had a trip and fall.  Taken to room and Level 2 trauma paged out.    Patient fell onto floor and hit face at home. Pt alert and oriented. Denies LOC. + eliquis, reports multiple falls over the past couple of days.    Allergies Allergies  Allergen Reactions   Latex Other (See Comments)    "sores in mouth after teeth cleaned"    Level of Care/Admitting Diagnosis ED Disposition     ED Disposition  Admit   Condition  --   Comment  Hospital Area: [facility] [[id number]]  Level of Care: Telemetry Cardiac [103]  May place patient in observation at [facility] or [facility] if equivalent level of care is available:: No  Covid Evaluation: Confirmed COVID Negative  Diagnosis: Acute kidney injury [960454]  Admitting Physician: Charlsie Quest [[id number]]  Attending Physician: Charlsie Quest [[id number]]          B Medical/Surgery History Past Medical History:  Diagnosis Date   Arthritis    HNP- lumbar   Borderline diabetes    Cancer 2013   prostate , treated /w radiation    Diabetes mellitus without complication    Foot drop, bilateral since birthpt wears braces in shoes   pt must have braces to walk   Hepatitis    "pt told had been exposed to hepatitis when he went to donate blood"   History of kidney stones    History of radiation therapy 04/12/11 - 06/06/11   prostate, seminal vesicles   History of stress test    Echocardiogram done - 03/2013    Hypercholesterolemia    states Dr. Renette Butters Rx Welchol- pt. hs stopped taking because he believe it gives him  leg cramps.   Hypertension    Nephrolithiasis    renal calculi- , also has been told that he has a cyst on kidney - R side    Neuromuscular disorder    bilateral foot drop- both feet    Paroxysmal atrial fibrillation    Past Surgical History:  Procedure Laterality Date   BACK SURGERY     CARDIOVERSION N/A 02/28/2021   Procedure: CARDIOVERSION;  Surgeon: Thurmon Fair, MD;  Location: [facility];  Service: Cardiovascular;  Laterality: N/A;   COLONOSCOPY N/A 07/22/2012   Procedure: COLONOSCOPY;  Surgeon: Dalia Heading, MD;  Location: [facility];  Service: Gastroenterology;  Laterality: N/A;   ESOPHAGEAL DILATION N/A 04/22/2019   Procedure: ESOPHAGEAL DILATION;  Surgeon: Malissa Hippo, MD;  Location: [facility];  Service: Endoscopy;  Laterality: N/A;   ESOPHAGOGASTRODUODENOSCOPY N/A 04/22/2019   Procedure: ESOPHAGOGASTRODUODENOSCOPY (EGD);  Surgeon: Malissa Hippo, MD;  Location: [facility];  Service: Endoscopy;  Laterality: N/A;  145   EXTRACORPOREAL SHOCK WAVE LITHOTRIPSY Left 03/04/2017   Procedure: LEFT EXTRACORPOREAL SHOCK WAVE LITHOTRIPSY (ESWL);  Surgeon: Rene Paci, MD;  Location: [facility];  Service: Urology;  Laterality: Left;   FOOT SURGERY  Right 1980's   calcium deposit removed   LUMBAR LAMINECTOMY/DECOMPRESSION MICRODISCECTOMY Right 09/01/2013   Procedure: RIGHT LUMBAR FOUR-FIVE LAMINECTOMY/DECOMPRESSION MICRODISCECTOMY 1 LEVEL;  Surgeon: Coletta Memos, MD;  Location: [facility];  Service: Neurosurgery;  Laterality: Right;  Right L45 microdiskectomy   prostate biopsy  2013   SHOULDER OPEN ROTATOR CUFF REPAIR Left 09/09/2012   Procedure: LEFT ROTATOR CUFF REPAIR SHOULDER OPEN;  Surgeon: Jacki Cones, MD;  Location: [facility];  Service: Orthopedics;  Laterality: Left;     A IV Location/Drains/Wounds Patient Lines/Drains/Airways Status      Active Line/Drains/Airways     Name Placement date Placement time Site Days   Peripheral IV 05/12/22 20 G Anterior;Distal;Right;Upper Arm 05/12/22  1819  Arm  1   Incision 09/09/12 Shoulder Left 09/09/12  1134  -- 3533   Incision (Closed) 09/01/13 Back Other (Comment) 09/01/13  1809  -- 3176            Intake/Output Last 24 hours  Intake/Output Summary (Last 24 hours) at 05/13/2022 1122 Last data filed at 05/12/2022 2334 Gross per 24 hour  Intake 1000 ml  Output --  Net 1000 ml    Labs/Imaging Results for orders placed or performed during the hospital encounter of 05/12/22 (from the past 48 hour(s))  Comprehensive metabolic panel     Status: Abnormal   Collection Time: 05/12/22  6:20 PM  Result Value Ref Range   Sodium 135 135 - 145 mmol/L   Potassium 3.6 3.5 - 5.1 mmol/L   Chloride 101 98 - 111 mmol/L   CO2 22 22 - 32 mmol/L   Glucose, Bld 71 70 - 99 mg/dL    Comment: Glucose reference range applies only to samples taken after fasting for at least 8 hours.   BUN 32 (H) 8 - 23 mg/dL   Creatinine, Ser 1.61 (H) 0.61 - 1.24 mg/dL   Calcium 8.3 (L) 8.9 - 10.3 mg/dL   Total Protein 6.4 (L) 6.5 - 8.1 g/dL   Albumin 3.6 3.5 - 5.0 g/dL   AST 62 (H) 15 - 41 U/L   ALT 41 0 - 44 U/L   Alkaline Phosphatase 49 38 - 126 U/L   Total Bilirubin 0.8 0.3 - 1.2 mg/dL   GFR, Estimated 48 (L) >60 mL/min    Comment: (NOTE) Calculated using the CKD-EPI Creatinine Equation (2021)    Anion gap 12 5 - 15    Comment: Performed at [facility], [address], [place], [place] [address]  CBC     Status: Abnormal   Collection Time: 05/12/22  6:20 PM  Result Value Ref Range   WBC 4.0 4.0 - 10.5 K/uL   RBC 4.87 4.22 - 5.81 MIL/uL   Hemoglobin 14.3 13.0 - 17.0 g/dL   HCT 54.0 98.1 - 19.1 %   MCV 88.5 80.0 - 100.0 fL   MCH 29.4 26.0 - 34.0 pg   MCHC 33.2 30.0 - 36.0 g/dL   RDW 47.8 29.5 - 62.1 %   Platelets 106 (L) 150 - 400 K/uL    Comment: REPEATED TO VERIFY   nRBC 0.0 0.0 - 0.2 %     Comment: Performed at [facility], [address] [place], [place] [address]  Ethanol     Status: None   Collection Time: 05/12/22  6:20 PM  Result Value Ref Range   Alcohol, Ethyl (B) <10 <10 mg/dL    Comment: (NOTE) Lowest detectable limit for serum alcohol is 10 mg/dL.  For medical purposes only. Performed at [facility], [address], [place], [place] [address]   Lactic acid, plasma     Status: None   Collection Time: 05/12/22  6:20 PM  Result Value Ref Range   Lactic Acid, Venous 0.9 0.5 - 1.9 mmol/L    Comment: Performed at [facility], [address], [place], [place] [address]  Protime-INR     Status: Abnormal   Collection Time: 05/12/22  6:20 PM  Result Value Ref Range   Prothrombin Time 17.6 (H) 11.4 - 15.2 seconds   INR 1.5 (H) 0.8 - 1.2    Comment: (NOTE) INR goal varies based on device and disease states. Performed at [facility], [address], [place], [place] [address]   Sample to Blood Bank     Status: None   Collection Time: 05/12/22  6:20 PM  Result Value Ref Range   Blood Bank Specimen SAMPLE AVAILABLE FOR TESTING    Sample Expiration      05/15/2022,2359 Performed at [facility], [address], [place], [place] [address]   CK     Status: Abnormal   Collection Time: 05/12/22  6:20 PM  Result Value Ref Range   Total CK 419 (H) 49 - 397 U/L    Comment: Performed at [facility], [address], [place], [place] [address]  I-Stat Chem 8, ED     Status: Abnormal   Collection Time: 05/12/22  6:30 PM  Result Value Ref Range   Sodium 136 135 - 145 mmol/L   Potassium 3.5 3.5 - 5.1 mmol/L   Chloride 100 98 - 111 mmol/L   BUN 34 (H) 8 - 23 mg/dL   Creatinine, Ser 1.30 (H) 0.61 - 1.24 mg/dL   Glucose, Bld 67 (L) 70 - 99 mg/dL    Comment: Glucose reference range applies only to samples taken after fasting for at least 8 hours.   Calcium, Ion 0.95 (L) 1.15 - 1.40 mmol/L   TCO2 23 22 - 32 mmol/L   Hemoglobin  14.3 13.0 - 17.0 g/dL   HCT 86.5 78.4 - 69.6 %  Resp panel by RT-PCR (RSV, Flu A&B, Covid) Anterior Nasal Swab     Status: None   Collection Time: 05/12/22  9:00 PM   Specimen: Anterior Nasal Swab  Result Value Ref Range   SARS Coronavirus 2 by RT PCR NEGATIVE NEGATIVE   Influenza A by PCR NEGATIVE NEGATIVE   Influenza B by PCR NEGATIVE NEGATIVE    Comment: (NOTE) The Xpert Xpress SARS-CoV-2/FLU/RSV plus assay is intended as an aid in the diagnosis of influenza from Nasopharyngeal swab specimens and should not be used as a sole basis for treatment. Nasal washings and aspirates are unacceptable for Xpert Xpress SARS-CoV-2/FLU/RSV testing.  Fact Sheet for Patients: BloggerCourse.com  Fact Sheet for Healthcare Providers: SeriousBroker.it  This test is not yet approved or cleared by the [place] FDA and has been authorized for detection and/or diagnosis of SARS-CoV-2 by FDA under an Emergency Use Authorization (EUA). This EUA will remain in effect (meaning this test can be used) for the duration of the COVID-19 declaration under Section 564(b)(1) of the Act, 21 U.S.C. section 360bbb-3(b)(1), unless the authorization is terminated or revoked.     Resp Syncytial Virus by PCR NEGATIVE NEGATIVE    Comment: (NOTE) Fact Sheet for Patients: BloggerCourse.com  Fact Sheet for Healthcare Providers: SeriousBroker.it  This test is not yet approved or cleared  by the [place] and has been authorized for detection and/or diagnosis of SARS-CoV-2 by FDA under an Emergency Use Authorization (EUA). This EUA will remain in effect (meaning this test can be used) for the duration of the COVID-19 declaration under Section 564(b)(1) of the Act, 21 U.S.C. section 360bbb-3(b)(1), unless the authorization is terminated or revoked.  Performed at [facility], [address],  [place], [place] [address]   POC CBG, ED     Status: Abnormal   Collection Time: 05/12/22  9:02 PM  Result Value Ref Range   Glucose-Capillary 45 (L) 70 - 99 mg/dL    Comment: Glucose reference range applies only to samples taken after fasting for at least 8 hours.  CBG monitoring, ED     Status: Abnormal   Collection Time: 05/12/22  9:16 PM  Result Value Ref Range   Glucose-Capillary 178 (H) 70 - 99 mg/dL    Comment: Glucose reference range applies only to samples taken after fasting for at least 8 hours.  Urinalysis, Routine w reflex microscopic -Urine, Random     Status: Abnormal   Collection Time: 05/12/22 10:08 PM  Result Value Ref Range   Color, Urine YELLOW YELLOW   APPearance HAZY (A) CLEAR   Specific Gravity, Urine 1.025 1.005 - 1.030   pH 5.0 5.0 - 8.0   Glucose, UA NEGATIVE NEGATIVE mg/dL   Hgb urine dipstick SMALL (A) NEGATIVE   Bilirubin Urine NEGATIVE NEGATIVE   Ketones, ur NEGATIVE NEGATIVE mg/dL   Protein, ur 503 (A) NEGATIVE mg/dL   Nitrite NEGATIVE NEGATIVE   Leukocytes,Ua NEGATIVE NEGATIVE   RBC / HPF 0-5 0 - 5 RBC/hpf   WBC, UA 0-5 0 - 5 WBC/hpf   Bacteria, UA NONE SEEN NONE SEEN   Squamous Epithelial / HPF 0-5 0 - 5 /HPF   Mucus PRESENT    Hyaline Casts, UA PRESENT     Comment: Performed at [facility], [address] [place], [place] [address]  CBG monitoring, ED     Status: None   Collection Time: 05/12/22 11:27 PM  Result Value Ref Range   Glucose-Capillary 84 70 - 99 mg/dL    Comment: Glucose reference range applies only to samples taken after fasting for at least 8 hours.  Blood culture (routine x 2)     Status: None (Preliminary result)   Collection Time: 05/12/22 11:32 PM   Specimen: BLOOD  Result Value Ref Range   Specimen Description BLOOD SITE NOT SPECIFIED    Special Requests      BOTTLES DRAWN AEROBIC AND ANAEROBIC Blood Culture results may not be optimal due to an excessive volume of blood received in culture bottles   Culture      NO  GROWTH < 12 HOURS Performed at [facility], [address], [place], [place] [address]    Report Status PENDING   Blood culture (routine x 2)     Status: None (Preliminary result)   Collection Time: 05/12/22 11:32 PM   Specimen: BLOOD  Result Value Ref Range   Specimen Description BLOOD SITE NOT SPECIFIED    Special Requests      BOTTLES DRAWN AEROBIC AND ANAEROBIC Blood Culture results may not be optimal due to an excessive volume of blood received in culture bottles   Culture      NO GROWTH < 12 HOURS Performed at [facility], [address], [place], [place] [address]    Report Status  PENDING   CBC     Status: Abnormal   Collection Time: 05/13/22 12:30 AM  Result Value Ref Range   WBC 3.1 (L) 4.0 - 10.5 K/uL   RBC 4.28 4.22 - 5.81 MIL/uL   Hemoglobin 12.6 (L) 13.0 - 17.0 g/dL   HCT 78.2 (L) 95.6 - 21.3 %   MCV 89.5 80.0 - 100.0 fL   MCH 29.4 26.0 - 34.0 pg   MCHC 32.9 30.0 - 36.0 g/dL   RDW 08.6 57.8 - 46.9 %   Platelets 88 (L) 150 - 400 K/uL    Comment: Immature Platelet Fraction may be clinically indicated, consider ordering this additional test [id number] REPEATED TO VERIFY PLATELET COUNT CONFIRMED BY SMEAR    nRBC 0.0 0.0 - 0.2 %    Comment: Performed at [facility], [address], [place], [place] [address]  Basic metabolic panel     Status: Abnormal   Collection Time: 05/13/22 12:30 AM  Result Value Ref Range   Sodium 134 (L) 135 - 145 mmol/L   Potassium 3.3 (L) 3.5 - 5.1 mmol/L   Chloride 102 98 - 111 mmol/L   CO2 21 (L) 22 - 32 mmol/L   Glucose, Bld 78 70 - 99 mg/dL    Comment: Glucose reference range applies only to samples taken after fasting for at least 8 hours.   BUN 28 (H) 8 - 23 mg/dL   Creatinine, Ser 1.02 0.61 - 1.24 mg/dL   Calcium 7.4 (L) 8.9 - 10.3 mg/dL   GFR, Estimated >72 >53 mL/min    Comment: (NOTE) Calculated using the CKD-EPI Creatinine Equation (2021)    Anion gap 11 5 - 15    Comment: Performed at [facility], [address], [place], [place] [address]  Protime-INR     Status: Abnormal   Collection Time: 05/13/22 12:30 AM  Result Value Ref Range   Prothrombin Time 17.1 (H) 11.4 - 15.2 seconds   INR 1.4 (H) 0.8 - 1.2    Comment: (NOTE) INR goal varies based on device and disease states. Performed at [facility], [address], [place], [place] [address]   CK     Status: None   Collection Time: 05/13/22 12:30 AM  Result Value Ref Range   Total CK 381 49 - 397 U/L    Comment: Performed at [facility], [address], [place], [place] [address]  CBG monitoring, ED     Status: Abnormal   Collection Time: 05/13/22  4:10 AM  Result Value Ref Range   Glucose-Capillary 57 (L) 70 - 99 mg/dL    Comment: Glucose reference range applies only to samples taken after fasting for at least 8 hours.   Comment 1 Call MD NNP PA CNM   CBG monitoring, ED     Status: Abnormal   Collection Time: 05/13/22  7:37 AM  Result Value Ref Range   Glucose-Capillary 68 (L) 70 - 99 mg/dL    Comment: Glucose reference range applies only to samples taken after fasting for at least 8 hours.   MR BRAIN WO CONTRAST  Result Date: 05/13/2022 CLINICAL DATA:  Initial evaluation for mental status change, unknown cause. Recent falls. EXAM: MRI HEAD WITHOUT CONTRAST TECHNIQUE: Multiplanar, multiecho pulse sequences of the brain and surrounding structures were obtained without intravenous contrast. COMPARISON:  Prior CTs from 05/12/2022. FINDINGS: Brain: Examination degraded by motion artifact. Diffuse prominence of the CSF containing spaces compatible generalized cerebral atrophy. Patchy  and confluent T2/FLAIR hyperdensity involving the periventricular deep white matter both cerebral hemispheres, consistent with chronic small vessel ischemic disease, moderate to advanced in nature. No abnormal foci of restricted diffusion to suggest acute or subacute ischemia. Gray-white matter differentiation maintained. No areas  of chronic cortical infarction. No acute intracranial hemorrhage. Few punctate chronic micro hemorrhages noted about the right cerebellum and occipital regions, likely hypertensive and/or small vessel related. No mass lesion, midline shift or mass effect. Mild ventricular prominence related global parenchymal volume loss without hydrocephalus. No extra-axial fluid collection. Pituitary gland and suprasellar region within normal limits. Vascular: Major intracranial vascular flow voids are maintained. Skull and upper cervical spine: Craniocervical junction within normal limits. Signal changes at the dens related to a prominent geode formation noted, also seen on prior CT. Underlying bone marrow signal intensity within normal limits. Evolving right frontal scalp contusion/hematoma. Sinuses/Orbits: Globes and orbital soft tissues demonstrate no acute finding. Mild scattered mucoperiosteal thickening present throughout the paranasal sinuses. Trace left mastoid effusion, of doubtful significance. Other: None. IMPRESSION: 1. No acute intracranial abnormality. 2. Evolving right frontal scalp contusion/hematoma. 3. Age-related cerebral atrophy with moderate to advanced chronic microvascular ischemic disease. Electronically Signed   By: Rise Mu M.D.   On: 05/13/2022 03:55   CT HEAD WO CONTRAST  Result Date: 05/12/2022 CLINICAL DATA:  Head trauma, moderate-severe; Facial trauma, blunt; Neck trauma (Age >= 65y) EXAM: CT HEAD WITHOUT CONTRAST CT MAXILLOFACIAL WITHOUT CONTRAST CT CERVICAL SPINE WITHOUT CONTRAST TECHNIQUE: Multidetector CT imaging of the head, cervical spine, and maxillofacial structures were performed using the standard protocol without intravenous contrast. Multiplanar CT image reconstructions of the cervical spine and maxillofacial structures were also generated. RADIATION DOSE REDUCTION: This exam was performed according to the departmental dose-optimization program which includes automated  exposure control, adjustment of the mA and/or kV according to patient size and/or use of iterative reconstruction technique. COMPARISON:  CT head 09/25/2021 FINDINGS: CT HEAD FINDINGS Brain: Cerebral ventricle sizes are concordant with the degree of cerebral volume loss. Patchy and confluent areas of decreased attenuation are noted throughout the deep and periventricular white matter of the cerebral hemispheres bilaterally, compatible with chronic microvascular ischemic disease. No evidence of large-territorial acute infarction. No parenchymal hemorrhage. No mass lesion. No extra-axial collection. No mass effect or midline shift. No hydrocephalus. Basilar cisterns are patent. Vascular: No hyperdense vessel. Atherosclerotic calcifications are present within the cavernous internal carotid and vertebral arteries. Skull: No acute fracture or focal lesion. Other: Right frontal scalp hematoma formation. CT MAXILLOFACIAL FINDINGS Osseous: No fracture or mandibular dislocation. No destructive process. Sinuses/Orbits: Paranasal sinuses and mastoid air cells are clear. The orbits are unremarkable. Soft tissues: Negative. CT CERVICAL SPINE FINDINGS Alignment: Normal. Skull base and vertebrae: Severe multilevel degenerative changes spine most prominent at the C5-C6 level. Multilevel moderate to severe osseous neural foraminal stenosis. No severe osseous central canal stenosis. No acute fracture. No aggressive appearing focal osseous lesion or focal pathologic process. Soft tissues and spinal canal: No prevertebral fluid or swelling. No visible canal hematoma. Upper chest: Interlobular septal wall thickening. Other: None. IMPRESSION: 1. No acute intracranial abnormality. 2.  No acute displaced facial fracture. 3. No acute displaced fracture or traumatic listhesis of the cervical spine. 4. Likely mild pulmonary edema. Electronically Signed   By: Tish Frederickson M.D.   On: 05/12/2022 21:38   CT Cervical Spine Wo  Contrast  Result Date: 05/12/2022 CLINICAL DATA:  Head trauma, moderate-severe; Facial trauma, blunt; Neck trauma (Age >= 65y) EXAM: CT HEAD WITHOUT  CONTRAST CT MAXILLOFACIAL WITHOUT CONTRAST CT CERVICAL SPINE WITHOUT CONTRAST TECHNIQUE: Multidetector CT imaging of the head, cervical spine, and maxillofacial structures were performed using the standard protocol without intravenous contrast. Multiplanar CT image reconstructions of the cervical spine and maxillofacial structures were also generated. RADIATION DOSE REDUCTION: This exam was performed according to the departmental dose-optimization program which includes automated exposure control, adjustment of the mA and/or kV according to patient size and/or use of iterative reconstruction technique. COMPARISON:  CT head 09/25/2021 FINDINGS: CT HEAD FINDINGS Brain: Cerebral ventricle sizes are concordant with the degree of cerebral volume loss. Patchy and confluent areas of decreased attenuation are noted throughout the deep and periventricular white matter of the cerebral hemispheres bilaterally, compatible with chronic microvascular ischemic disease. No evidence of large-territorial acute infarction. No parenchymal hemorrhage. No mass lesion. No extra-axial collection. No mass effect or midline shift. No hydrocephalus. Basilar cisterns are patent. Vascular: No hyperdense vessel. Atherosclerotic calcifications are present within the cavernous internal carotid and vertebral arteries. Skull: No acute fracture or focal lesion. Other: Right frontal scalp hematoma formation. CT MAXILLOFACIAL FINDINGS Osseous: No fracture or mandibular dislocation. No destructive process. Sinuses/Orbits: Paranasal sinuses and mastoid air cells are clear. The orbits are unremarkable. Soft tissues: Negative. CT CERVICAL SPINE FINDINGS Alignment: Normal. Skull base and vertebrae: Severe multilevel degenerative changes spine most prominent at the C5-C6 level. Multilevel moderate to severe  osseous neural foraminal stenosis. No severe osseous central canal stenosis. No acute fracture. No aggressive appearing focal osseous lesion or focal pathologic process. Soft tissues and spinal canal: No prevertebral fluid or swelling. No visible canal hematoma. Upper chest: Interlobular septal wall thickening. Other: None. IMPRESSION: 1. No acute intracranial abnormality. 2.  No acute displaced facial fracture. 3. No acute displaced fracture or traumatic listhesis of the cervical spine. 4. Likely mild pulmonary edema. Electronically Signed   By: Tish Frederickson M.D.   On: 05/12/2022 21:38   CT Maxillofacial Wo Contrast  Result Date: 05/12/2022 CLINICAL DATA:  Head trauma, moderate-severe; Facial trauma, blunt; Neck trauma (Age >= 65y) EXAM: CT HEAD WITHOUT CONTRAST CT MAXILLOFACIAL WITHOUT CONTRAST CT CERVICAL SPINE WITHOUT CONTRAST TECHNIQUE: Multidetector CT imaging of the head, cervical spine, and maxillofacial structures were performed using the standard protocol without intravenous contrast. Multiplanar CT image reconstructions of the cervical spine and maxillofacial structures were also generated. RADIATION DOSE REDUCTION: This exam was performed according to the departmental dose-optimization program which includes automated exposure control, adjustment of the mA and/or kV according to patient size and/or use of iterative reconstruction technique. COMPARISON:  CT head 09/25/2021 FINDINGS: CT HEAD FINDINGS Brain: Cerebral ventricle sizes are concordant with the degree of cerebral volume loss. Patchy and confluent areas of decreased attenuation are noted throughout the deep and periventricular white matter of the cerebral hemispheres bilaterally, compatible with chronic microvascular ischemic disease. No evidence of large-territorial acute infarction. No parenchymal hemorrhage. No mass lesion. No extra-axial collection. No mass effect or midline shift. No hydrocephalus. Basilar cisterns are patent.  Vascular: No hyperdense vessel. Atherosclerotic calcifications are present within the cavernous internal carotid and vertebral arteries. Skull: No acute fracture or focal lesion. Other: Right frontal scalp hematoma formation. CT MAXILLOFACIAL FINDINGS Osseous: No fracture or mandibular dislocation. No destructive process. Sinuses/Orbits: Paranasal sinuses and mastoid air cells are clear. The orbits are unremarkable. Soft tissues: Negative. CT CERVICAL SPINE FINDINGS Alignment: Normal. Skull base and vertebrae: Severe multilevel degenerative changes spine most prominent at the C5-C6 level. Multilevel moderate to severe osseous neural foraminal stenosis. No severe osseous  central canal stenosis. No acute fracture. No aggressive appearing focal osseous lesion or focal pathologic process. Soft tissues and spinal canal: No prevertebral fluid or swelling. No visible canal hematoma. Upper chest: Interlobular septal wall thickening. Other: None. IMPRESSION: 1. No acute intracranial abnormality. 2.  No acute displaced facial fracture. 3. No acute displaced fracture or traumatic listhesis of the cervical spine. 4. Likely mild pulmonary edema. Electronically Signed   By: Tish Frederickson M.D.   On: 05/12/2022 21:38   CT CHEST ABDOMEN PELVIS W CONTRAST  Result Date: 05/12/2022 CLINICAL DATA:  Polytrauma, blunt.  Fall. EXAM: CT CHEST, ABDOMEN, AND PELVIS WITH CONTRAST TECHNIQUE: Multidetector CT imaging of the chest, abdomen and pelvis was performed following the standard protocol during bolus administration of intravenous contrast. RADIATION DOSE REDUCTION: This exam was performed according to the departmental dose-optimization program which includes automated exposure control, adjustment of the mA and/or kV according to patient size and/or use of iterative reconstruction technique. CONTRAST:  75mL OMNIPAQUE IOHEXOL 350 MG/ML SOLN COMPARISON:  01/18/2017. FINDINGS: CT CHEST FINDINGS Cardiovascular: Heart is enlarged and  there is no pericardial effusion. Coronary artery calcifications are noted. 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 The pulmonary trunk is normal in caliber. Mediastinum/Nodes: No enlarged mediastinal, hilar, or axillary lymph nodes. Thyroid gland, trachea, and esophagus demonstrate no significant findings. Lungs/Pleura: Atelectasis is present bilaterally. No effusion or pneumothorax. There is a subpleural nodule in the right middle lobe measuring 6 mm, axial image 88, unchanged from 2018 and likely benign. Musculoskeletal: Degenerative changes are present in the thoracic spine. No acute fracture. CT ABDOMEN PELVIS FINDINGS Hepatobiliary: No focal liver abnormality is seen. No gallstones, gallbladder wall thickening, or biliary dilatation. Pancreas: Unremarkable. No pancreatic ductal dilatation or surrounding inflammatory changes. Spleen: No splenic injury or perisplenic hematoma. Adrenals/Urinary Tract: The adrenal glands are within normal limits. Multiple cysts are present within the kidneys. No renal calculus or hydronephrosis. Bladder is unremarkable. Stomach/Bowel: Stomach is within normal limits. Appendix appears normal. No evidence of bowel wall thickening, distention, or inflammatory changes. No free air or pneumatosis. Vascular/Lymphatic: Aortic atherosclerosis. No enlarged abdominal or pelvic lymph nodes. Reproductive: Prostate is normal in size. Brachytherapy seeds are noted. Other: No abdominopelvic ascites. Musculoskeletal: Degenerative changes in the lumbar spine. No acute osseous abnormality. IMPRESSION: 1. No evidence of acute fracture or solid organ injury. 2. 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 Recommend annual imaging followup by CTA or MRA. This recommendation follows 2010 ACCF/AHA/AATS/ACR/ASA/SCA/SCAI/SIR/STS/SVM Guidelines for the Diagnosis and Management of  Patients with Thoracic Aortic Disease. Circulation. 2010; 121: G956-O130. Aortic aneurysm NOS (ICD10-I71.9) 3. Coronary artery calcifications. Electronically Signed   By: Thornell Sartorius M.D.   On: 05/12/2022 21:21   DG Pelvis Portable  Result Date: 05/12/2022 CLINICAL DATA:  Trauma, fall. EXAM: PORTABLE PELVIS 1-2 VIEWS COMPARISON:  None Available. FINDINGS: The cortical margins of the bony pelvis are intact. No fracture. Pubic symphysis and sacroiliac joints are congruent. Both femoral heads are well-seated in the respective acetabula. Mild bilateral hip degenerative change. IMPRESSION: No pelvic fracture. Electronically Signed   By: Narda Rutherford M.D.   On: 05/12/2022 19:02   DG Chest Port 1 View  Result Date: 05/12/2022 CLINICAL DATA:  Trauma, fall. EXAM: PORTABLE CHEST 1 VIEW COMPARISON:  Radiograph 03/06/2013 FINDINGS: The cardiomediastinal contours are normal. Aortic arch atherosclerosis. Pulmonary vasculature is normal. No consolidation, pleural effusion, or pneumothorax. No acute osseous abnormalities are seen.  IMPRESSION: No acute findings or evidence of traumatic injury. Electronically Signed   By: Narda Rutherford M.D.   On: 05/12/2022 19:01    Pending Labs Unresulted Labs (From admission, onward)     Start     Ordered   05/13/22 0807  Magnesium  Add-on,   AD        05/13/22 0807            Vitals/Pain Today's Vitals   05/13/22 0700 05/13/22 0745 05/13/22 0800 05/13/22 0848  BP: 120/68 126/70 130/82 130/82  Pulse: 83 83 86 86  Resp: (!) 31 (!) 25 (!) 28   Temp:      TempSrc:      SpO2: 96% 95% 95%   Weight:      Height:      PainSc:        Isolation Precautions No active isolations  Medications Medications  sodium chloride flush (NS) 0.9 % injection 3 mL (3 mLs Intravenous Given 05/12/22 2353)  acetaminophen (TYLENOL) tablet 650 mg (has no administration in time range)    Or  acetaminophen (TYLENOL) suppository 650 mg (has no administration in time range)   ondansetron (ZOFRAN) tablet 4 mg (has no administration in time range)    Or  ondansetron (ZOFRAN) injection 4 mg (has no administration in time range)  senna-docusate (Senokot-S) tablet 1 tablet (has no administration in time range)  dextrose 50 % solution 12.5 g (12.5 g Intravenous Given 05/13/22 0509)  LORazepam (ATIVAN) injection 1 mg (has no administration in time range)  metoprolol tartrate (LOPRESSOR) tablet 25 mg (25 mg Oral Given 05/13/22 0848)  potassium chloride 10 mEq in 100 mL IVPB (0 mEq Intravenous Stopped 05/13/22 1032)  dextrose 5 %-0.9 % sodium chloride infusion ( Intravenous New Bag/Given 05/13/22 0850)  haloperidol lactate (HALDOL) injection 2 mg (2 mg Intravenous Given 05/12/22 1947)  LORazepam (ATIVAN) injection 1 mg (1 mg Intravenous Given 05/12/22 1948)  dextrose 50 % solution 50 mL (50 mLs Intravenous Given 05/12/22 2105)  iohexol (OMNIPAQUE) 350 MG/ML injection 75 mL (75 mLs Intravenous Contrast Given 05/12/22 2107)  sodium chloride 0.9 % bolus 1,000 mL (0 mLs Intravenous Stopped 05/12/22 2334)  dextrose 50 % solution 50 mL (50 mLs Intravenous Given 05/13/22 0841)    Mobility walks     Focused Assessments Cardiac Assessment Handoff:  Cardiac Rhythm: Normal sinus rhythm Lab Results  Component Value Date   CKTOTAL 381 05/13/2022   TROPONINI <0.30 03/14/2012   No results found for: "DDIMER" Does the Patient currently have chest pain? No    R Recommendations: See Admitting Provider Note  Report given to:   Additional Notes:

## 2022-05-14 DIAGNOSIS — R748 Abnormal levels of other serum enzymes: Secondary | ICD-10-CM

## 2022-05-14 DIAGNOSIS — D696 Thrombocytopenia, unspecified: Secondary | ICD-10-CM

## 2022-05-14 DIAGNOSIS — N179 Acute kidney failure, unspecified: Secondary | ICD-10-CM | POA: Diagnosis not present

## 2022-05-14 DIAGNOSIS — E11649 Type 2 diabetes mellitus with hypoglycemia without coma: Secondary | ICD-10-CM

## 2022-05-14 DIAGNOSIS — W19XXXD Unspecified fall, subsequent encounter: Secondary | ICD-10-CM

## 2022-05-14 DIAGNOSIS — G9341 Metabolic encephalopathy: Secondary | ICD-10-CM | POA: Diagnosis not present

## 2022-05-14 LAB — BASIC METABOLIC PANEL
Anion gap: 10 (ref 5–15)
BUN: 13 mg/dL (ref 8–23)
CO2: 23 mmol/L (ref 22–32)
Calcium: 7.9 mg/dL — ABNORMAL LOW (ref 8.9–10.3)
Chloride: 99 mmol/L (ref 98–111)
Creatinine, Ser: 1 mg/dL (ref 0.61–1.24)
GFR, Estimated: >60 mL/min (ref >60)
Glucose, Bld: 115 mg/dL — ABNORMAL HIGH (ref 70–99)
Potassium: 3.8 mmol/L (ref 3.5–5.1)
Sodium: 132 mmol/L — ABNORMAL LOW (ref 135–145)

## 2022-05-14 LAB — CBC WITH DIFFERENTIAL/PLATELET
Abs Immature Granulocytes: 0 10*3/uL (ref 0.00–0.07)
Basophils Absolute: 0 10*3/uL (ref 0.0–0.1)
Basophils Relative: 0 %
Eosinophils Absolute: 0 10*3/uL (ref 0.0–0.5)
Eosinophils Relative: 0 %
HCT: 44 % (ref 39.0–52.0)
Hemoglobin: 14.8 g/dL (ref 13.0–17.0)
Lymphocytes Relative: 21 %
Lymphs Abs: 0.8 10*3/uL (ref 0.7–4.0)
MCH: 29.3 pg (ref 26.0–34.0)
MCHC: 33.6 g/dL (ref 30.0–36.0)
MCV: 87.1 fL (ref 80.0–100.0)
Monocytes Absolute: 0.2 10*3/uL (ref 0.1–1.0)
Monocytes Relative: 5 %
Neutro Abs: 3 10*3/uL (ref 1.7–7.7)
Neutrophils Relative %: 74 %
Platelets: 69 10*3/uL — ABNORMAL LOW (ref 150–400)
RBC: 5.05 MIL/uL (ref 4.22–5.81)
RDW: 13.1 % (ref 11.5–15.5)
WBC: 4 10*3/uL (ref 4.0–10.5)
nRBC: 0 % (ref 0.0–0.2)

## 2022-05-14 LAB — GLUCOSE, CAPILLARY
Glucose-Capillary: 123 mg/dL — ABNORMAL HIGH (ref 70–99)
Glucose-Capillary: 121 mg/dL — ABNORMAL HIGH (ref 70–99)
Glucose-Capillary: 152 mg/dL — ABNORMAL HIGH (ref 70–99)
Glucose-Capillary: 125 mg/dL — ABNORMAL HIGH (ref 70–99)
Glucose-Capillary: 143 mg/dL — ABNORMAL HIGH (ref 70–99)
Glucose-Capillary: 144 mg/dL — ABNORMAL HIGH (ref 70–99)

## 2022-05-14 LAB — HEPATIC FUNCTION PANEL
ALT: 35 U/L (ref 0–44)
AST: 66 U/L — ABNORMAL HIGH (ref 15–41)
Albumin: 2.9 g/dL — ABNORMAL LOW (ref 3.5–5.0)
Alkaline Phosphatase: 39 U/L (ref 38–126)
Bilirubin, Direct: 0.1 mg/dL (ref 0.0–0.2)
Indirect Bilirubin: 0.6 mg/dL (ref 0.3–0.9)
Total Bilirubin: 0.7 mg/dL (ref 0.3–1.2)
Total Protein: 5.9 g/dL — ABNORMAL LOW (ref 6.5–8.1)

## 2022-05-14 LAB — TECHNOLOGIST SMEAR REVIEW: Plt Morphology: NORMAL

## 2022-05-14 LAB — MAGNESIUM: Magnesium: 1.6 mg/dL — ABNORMAL LOW (ref 1.7–2.4)

## 2022-05-14 LAB — LACTATE DEHYDROGENASE: LDH: 413 U/L — ABNORMAL HIGH (ref 98–192)

## 2022-05-14 LAB — HEMOGLOBIN A1C
Hgb A1c MFr Bld: 5.9 % — ABNORMAL HIGH (ref 4.8–5.6)
Mean Plasma Glucose: 122.63 mg/dL

## 2022-05-14 LAB — HIV ANTIBODY (ROUTINE TESTING W REFLEX): HIV Screen 4th Generation wRfx: NONREACTIVE

## 2022-05-14 LAB — VITAMIN B12: Vitamin B-12: 377 pg/mL (ref 180–914)

## 2022-05-14 LAB — TSH: TSH: 2.324 u[IU]/mL (ref 0.350–4.500)

## 2022-05-14 LAB — FOLATE: Folate: 26.3 ng/mL (ref >5.9)

## 2022-05-14 LAB — CULTURE, BLOOD (ROUTINE X 2)

## 2022-05-14 MED ORDER — LIVING WELL WITH DIABETES BOOK
Freq: Once | Status: AC
  Filled 2022-05-14 (×2): qty 1

## 2022-05-14 MED ORDER — QUETIAPINE FUMARATE 25 MG PO TABS
25.0000 mg | ORAL_TABLET | Freq: Three times a day (TID) | ORAL | Status: DC | PRN
  Administered 2022-05-14: 25 mg via ORAL
  Filled 2022-05-14: qty 1

## 2022-05-14 MED ORDER — MAGNESIUM SULFATE 4 GM/100ML IV SOLN
4.0000 g | Freq: Once | INTRAVENOUS | Status: AC
  Administered 2022-05-14: 4 g via INTRAVENOUS
  Filled 2022-05-14: qty 100

## 2022-05-14 MED ORDER — QUETIAPINE FUMARATE 25 MG PO TABS: 25.0000 mg | ORAL_TABLET | Freq: Every day | ORAL | Status: DC

## 2022-05-14 NOTE — Evaluation (Signed)
Occupational Therapy Evaluation Patient Details Name: James Dyer MRN: [id number] DOB: Oct 21, 1945 Today's Date: 05/14/2022   History of Present Illness Pt is a 77 y.o. male who presented 05/12/22 s/p fall with AMS. Imaging showed no acute intracranial abnormality, no acute displaced facial fracture, no acute displaced fracture or traumatic listhesis of the C-spine. CT chest/abdomen/pelvis with contrast negative for acute fracture or solid organ injury. Admitted with AKI and hypoglycemia. PMH: PAF on Eliquis, T2DM, HTN, HLD, chronic bilateral foot drop, history of prostate cancer   Clinical Impression   Pt admitted for above dx, PTA patient lived with spouse and was independent in bADLs/iADLs ambulating no AD using AFOs but has had about 3 known falls in the last 6 months. Patient currently presenting with increased lethragy and impaired standing balance, unable to safely offload BLEs and has a hard time maintaining an upright position. Pt also displaying impaired cognition and processing skills, which family notes is different from baseline although patient is orientedx4. Pt also exhibits some word finding and visual challenges, which will be further assessed. Pt currently limited by impaired cognition, gait, and balance deficits and would benefit from continued acute skilled OT services.  Patient has the potential to reach Mod I and demos the ability to tolerate 3 hours of therapy. Pt would benefit from an intensive rehab program to help maximize functional independence.      Recommendations for follow up therapy are one component of a multi-disciplinary discharge planning process, led by the attending physician.  Recommendations may be updated based on patient status, additional functional criteria and insurance authorization.   Assistance Recommended at Discharge Frequent or constant Supervision/Assistance  Patient can return home with the following Two people to help with walking and/or  transfers;A lot of help with bathing/dressing/bathroom;Assistance with cooking/housework;Assist for transportation;Direct supervision/assist for financial management;Direct supervision/assist for medications management;Help with stairs or ramp for entrance    Functional Status Assessment  Patient has had a recent decline in their functional status and demonstrates the ability to make significant improvements in function in a reasonable and predictable amount of time.  Equipment Recommendations  Other (comment) (Defer to next level of care; & pending patient progression)    Recommendations for Other Services       Precautions / Restrictions Precautions Precautions: Fall;Other (comment) Precaution Comments: watch vitals; bil foot drop with braces at baseline Required Braces or Orthoses: Other Brace Other Brace: BLE AFOs for OOB mobility Restrictions Weight Bearing Restrictions: No      Mobility Bed Mobility Overal bed mobility: Needs Assistance Bed Mobility: Supine to Sit, Sit to Supine     Supine to sit: Mod assist, HOB elevated Sit to supine: Mod assist, HOB elevated   General bed mobility comments: Pt needed assist to bring LEs above bed to return to supine, with cueing patient assists in scooting but needs Mod A to shift hips forward + use of bedrails    Transfers Overall transfer level: Needs assistance Equipment used: Rolling walker (2 wheels) Transfers: Sit to/from Stand Sit to Stand: From elevated surface, Mod assist           General transfer comment: STS x2, Pt unable to take steps safely      Balance Overall balance assessment: Needs assistance Sitting-balance support: Single extremity supported, Bilateral upper extremity supported, Feet supported Sitting balance-Leahy Scale: Fair     Standing balance support: Bilateral upper extremity supported, Reliant on assistive device for balance Standing balance-Leahy Scale: Poor  ADL either performed or assessed with clinical judgement   ADL Overall ADL's : Needs assistance/impaired Eating/Feeding: Maximal assistance;Bed level Eating/Feeding Details (indicate cue type and reason): Son fed patient earlier Grooming: Sitting;Min guard;Wash/dry face   Upper Body Bathing: Sitting;Min guard;Cueing for sequencing   Lower Body Bathing: Maximal assistance;Sitting/lateral leans   Upper Body Dressing : Sitting;Cueing for sequencing;Minimal assistance   Lower Body Dressing: Total assistance;Sitting/lateral leans Lower Body Dressing Details (indicate cue type and reason): Total A to don/doff AFOs, which patient would do independently before Toilet Transfer: Maximal assistance   Toileting- Clothing Manipulation and Hygiene: Maximal assistance   Tub/ Shower Transfer: Maximal assistance     General ADL Comments: Stood at bedside     Vision Baseline Vision/History: 0 No visual deficits Vision Assessment?: Vision impaired- to be further tested in functional context Tracking/Visual Pursuits: Able to track stimulus in all quads without difficulty Visual Fields: Other (comment) (Difficulty identifying # of therapists fingers in RLQ) Additional Comments: Pt wears reading glasses at baseline. Although patient does not display dysmetira, he had difficulty returning his finger to touch the tip of his nose, may be d/t impaired command following     Perception     Praxis      Pertinent Vitals/Pain Pain Assessment Pain Assessment: No/denies pain     Hand Dominance Right   Extremity/Trunk Assessment Upper Extremity Assessment Upper Extremity Assessment: Generalized weakness   Lower Extremity Assessment Lower Extremity Assessment: Generalized weakness;RLE deficits/detail;LLE deficits/detail RLE Deficits / Details: MMT scores of 4+ hip flexion, 5 knee extension; hx of bil foot drop; denied numbness/tingling bil LLE Deficits / Details: MMT scores of 4+ hip flexion,  5 knee extension; hx of bil foot drop; denied numbness/tingling bil   Cervical / Trunk Assessment Cervical / Trunk Assessment: Normal   Communication Communication Communication: No difficulties   Cognition Arousal/Alertness: Awake/alert Behavior During Therapy: Flat affect Overall Cognitive Status: Impaired/Different from baseline Area of Impairment: Attention, Following commands, Memory, Safety/judgement, Problem solving, Awareness                   Current Attention Level: Sustained Memory: Decreased short-term memory Following Commands: Follows one step commands inconsistently, Follows one step commands with increased time Safety/Judgement: Decreased awareness of safety, Decreased awareness of deficits Awareness: Emergent Problem Solving: Slow processing, Decreased initiation, Difficulty sequencing, Requires verbal cues, Requires tactile cues General Comments: Pt requires frequent verbal cueing to redirect attention to task and to initate tasks when prompted. Pt seemed to have difficulty with word finding when reading from newspaper     General Comments  HR and Sp02 stable on RA, Pt reported dizziness in beginning of session with BP at 154/114. Family present and engaged in therapy    Exercises     Shoulder Instructions      Home Living Family/patient expects to be discharged to:: Private residence Living Arrangements: Spouse/significant other Available Help at Discharge: Family;Available PRN/intermittently Type of Home: House Home Access: Stairs to enter [employer] of Steps: 1-2 (in back with no rail, 2 in front with R rail) Entrance Stairs-Rails: Right;None (1 in back with no rail, 2 in front with R rail) Home Layout: One level     Bathroom Shower/Tub: Producer, television/film/video: Standard Bathroom Accessibility: Yes   Home Equipment: Agricultural consultant (2 wheels);Cane - single point   Additional Comments: has x3 dogs      Prior  Functioning/Environment Prior Level of Function : Independent/Modified Independent;Driving;History of Falls (last six months)  Mobility Comments: No AD, wears bil AFOs and has hx of falls on uneven surfaces, like in the woods, due to limited ankle ROM. Pt notes 3 other falls in the last 6 months ADLs Comments: Manages own meds and finances, dresses and bathes himself        OT Problem List: Impaired balance (sitting and/or standing);Decreased activity tolerance;Decreased strength;Decreased cognition;Decreased safety awareness;Decreased knowledge of use of DME or AE      OT Treatment/Interventions: Self-care/ADL training;Therapeutic activities;Therapeutic exercise;Energy conservation;Visual/perceptual remediation/compensation;DME and/or AE instruction;Patient/family education;Balance training;Manual therapy    OT Goals(Current goals can be found in the care plan section) Acute Rehab OT Goals Patient Stated Goal: To go back home OT Goal Formulation: With patient/family Time For Goal Achievement: 05/28/22 Potential to Achieve Goals: Good ADL Goals Pt Will Perform Grooming: standing;with min guard assist Additional ADL Goal #1: Pt will don bilat AFOs while sitting EOB with Supervision Additional ADL Goal #2: Pt will complete BSC t/f with Min guard assist + LRAD  OT Frequency: Min 3X/week    Co-evaluation              AM-PAC OT "6 Clicks" Daily Activity     Outcome Measure Help from another person eating meals?: A Lot Help from another person taking care of personal grooming?: A Little Help from another person toileting, which includes using toliet, bedpan, or urinal?: A Lot Help from another person bathing (including washing, rinsing, drying)?: A Lot Help from another person to put on and taking off regular upper body clothing?: A Little Help from another person to put on and taking off regular lower body clothing?: Total 6 Click Score: 13   End of Session  Equipment Utilized During Treatment: Gait belt;Rolling walker (2 wheels) Nurse Communication: Need for lift equipment  Activity Tolerance: Patient tolerated treatment well Patient left: in bed;with call bell/phone within reach;with bed alarm set;with family/visitor present  OT Visit Diagnosis: Unsteadiness on feet (R26.81);Other abnormalities of gait and mobility (R26.89);History of falling (Z91.81);Muscle weakness (generalized) (M62.81)                Time: 0511-0211 OT Time Calculation (min): 52 min Charges:  OT Evaluation $OT Eval Moderate Complexity: 1 Mod OT Treatments $Therapeutic Activity: 23-37 mins  05/14/2022  AB, OTR/L  Acute Rehabilitation Services  Office: [phone]   Tristan Schroeder 05/14/2022, 3:31 PM

## 2022-05-14 NOTE — Inpatient Diabetes Management (Addendum)
Inpatient Diabetes Program Recommendations  AACE/ADA: New Consensus Statement on Inpatient Glycemic Control (2015)  Target Ranges:  Prepandial:   less than 140 mg/dL      Peak postprandial:   less than 180 mg/dL (1-2 hours)      Critically ill patients:  140 - 180 mg/dL   Lab Results  Component Value Date   GLUCAP 152 (H) 05/14/2022   HGBA1C 5.9 (H) 05/14/2022    Latest Reference Range & Units 05/12/22 23:27 05/13/22 04:10 05/13/22 07:37 05/13/22 15:20 05/13/22 21:55 05/14/22 01:02 05/14/22 03:16 05/14/22 07:43 05/14/22 11:34  Glucose-Capillary 70 - 99 mg/dL 84 57 (L) 68 (L) 98 314 (H) 123 (H) 121 (H) 152 (H) 125 (H)  (L): Data is abnormally low (H): Data is abnormally high   Diabetes history: DM2 Outpatient Diabetes medications: Amaryl 2 mg am, 1 mg pm Current orders for Inpatient glycemic control: CBGs only  Inpatient Diabetes Program Recommendations:   Noted A1c 5.9. Agree with plan to D/C Amaryl on discharge home to prevent hypoglycemia. Spoke with patient, son, son's significant other, wife and sister in law @ bedside. Patient has lost 24 lbs since January and changed diet to low carbohydrate low sugar diet. MD ordered application of Freestyle CGM for use @ discharge for patient. Education done regarding application and changing CGM sensor (alternate every 14 days on back of arms), 1 hour warm-up, use of glucometer when alert displays, how to scan CGM for glucose reading and information for PCP. Patient has also been given educational packet regarding use CGM sensor including the 1-800 toll free number for any questions, problems or needs related to the [facility] sensors or reader.    Sensor applied by patient to (L) Arm at 100p.  Explained that glucose readings will not be available until 1 hour after application. Reviewed use of CGM including how to scan, changing Sensor, Vitamin C warning, arrows with glucose readings, and Freestyle app. Gave patient Freestyle reader as well  due to the app not being compatible with her phone.  Patient and family very appreciative.   If patient admitted to rehab, please consider leaving CGM on along with doing CBGs.  Thank you, Billy Fischer. Maxtyn Nuzum, RN, MSN, CDE  Diabetes Coordinator Inpatient Glycemic Control Team Team Pager [phone] (8am-5pm) 05/14/2022 11:42 AM

## 2022-05-14 NOTE — Progress Notes (Signed)
OT Cancellation Note  Patient Details Name: ZAMIR DUDZIK MRN: [id number] DOB: [birth year]   Cancelled Treatment:    Reason Eval/Treat Not Completed: Fatigue/lethargy limiting ability to participate (Pt family noting lack of sleep from patient, requested OT come later for therapy)  05/14/2022  AB, OTR/L  Acute Rehabilitation Services  Office: [phone]  Tristan Schroeder 05/14/2022, 10:11 AM

## 2022-05-14 NOTE — TOC Progression Note (Signed)
Transition of Care [facility]) - Progression Note    Patient Details  Name: James Dyer MRN: [id number] Date of Birth: 1945/06/13  [facility]) CM/SW Contact  Leone Haven, RN Phone Number: 05/14/2022, 3:40 PM  Clinical Narrative:     From home, fell at home, AKI, pt eval rec CIR.  PT eval rec BSC. [facility] Aase and hosp bed. TOC following.       Expected Discharge Plan and Services                                               Social Determinants of Health (SDOH) Interventions SDOH Screenings   Food Insecurity: No Food Insecurity (05/13/2022)  Housing: Low Risk  (05/13/2022)  Transportation Needs: No Transportation Needs (05/13/2022)  Utilities: Not At Risk (05/13/2022)  Tobacco Use: Medium Risk (05/12/2022)    Readmission Risk Interventions     No data to display

## 2022-05-14 NOTE — Plan of Care (Signed)

## 2022-05-14 NOTE — Progress Notes (Signed)
PROGRESS NOTE    James Dyer  EXH:[id number] DOB: July 17, 1945 DOA: 05/12/2022 PCP: [name], MD    Chief Complaint  Patient presents with   Level 2-FOT   Fall    Brief Narrative:  James Dyer is a 77 y.o. male with medical history significant for PAF on Eliquis, T2DM, HTN, HLD, chronic bilateral foot drop, history of prostate cancer who is admitted with AKI and altered mental status after falling on his face at home.     Assessment & Plan:   Principal Problem:   Acute kidney injury Active Problems:   HTN (hypertension)   Type 2 diabetes mellitus with hypoglycemia   Paroxysmal atrial fibrillation   Elevated CK   Fall at home, initial encounter   Altered mental status   Thrombocytopenia   Fever   AKI (acute kidney injury)   Acute metabolic encephalopathy   Hypoglycemia   Fall   Hypomagnesemia  #1 acute kidney injury -Felt likely secondary to prerenal azotemia as patient noted to be volume depleted on examination in the setting of fall. -Creatinine on admission was 1.49, baseline approximately 1.1, CK at 419. -Renal function improving with hydration.  CK levels trending down. -Creatinine down to 1.00 today. -Urinalysis nitrite negative leukocytes -100 protein. -Saline lock IV fluids. -Repeat labs in the AM.  2.  Acute metabolic encephalopathy -Patient noted to have reported some mental status changes after fall on his face at home. -Likely secondary to hypoglycemia (per family CBGs noted in the 30s on initial presentation to the ED) versus postconcussive syndrome. -Head CT negative for any acute changes. -MRI brain negative for any acute abnormalities. -Mental status has improved. -Follow-up.  3.  Fall at home/chronic bilateral foot drop -Patient noted to have had multiple falls at home resulting in ecchymosis periorbitally. -Patient underwent extensive imaging on admission with no significant fracture or traumatic injury noted. -Unclear  circumstances of fall however patient noted to have CBGs in the 30s on presentation per son. -Chronic foot drop and increasing risk of fall. -MRI brain done negative for any acute abnormalities. -PT/OT recommending evaluation for acute inpatient rehab. -Fall precautions.  4.  Type 2 diabetes mellitus with hypoglycemic episodes -Per son patient noted to be hypoglycemic with CBGs in the 30s on presentation to the ED. -Hypoglycemia likely contributed to acute encephalopathy and falls. -CBG recorded on chart on presentation was 45. -Patient received D50 amp with some improvement with blood glucose levels. -Patient received another amp of D50. -IV fluids changed to D5 normal saline. -Hemoglobin A1c 5.9. -Discontinue IV fluids. -Continue to hold home regimen Amaryl and likely will not resume on discharge. -Continue to check CBGs every 4 hours for the next 24 hours and if blood glucose levels remain stable will change to before meals and at bedtime. -Family inquiring on continuous glucose meter and as such we will have patient seen by diabetic coordinator. -Supportive care.  5.  Fever -Patient noted to have a Tmax of 102.5 rectally while in the ED. -Urinalysis negative for UTI. -Extensive imaging done negative for any obvious infection. -Patient with no leukocytosis noted. -COVID-19 PCR negative, RSV PCR negative, influenza A and B PCR negative. -Fever curve trending down. -Blood cultures ordered and pending with no growth to date x 1 day.. -Continue to monitor off antibiotics.    6.  Paroxysmal atrial fibrillation -Continue Lopressor for rate control. -Eliquis on hold due to new thrombocytopenia and periorbital ecchymosis. -May resume Eliquis once platelet count is stabilized and not dropping  further.  7.  Thrombocytopenia -Eliquis on hold. -Check LDH, haptoglobin, peripheral smear. -Repeat labs in the AM and if no improvement and worsening will consult with hematology for further  evaluation..  8.  Ascending aortic aneurysm -Patient with a 4.1 cm aneurysmal dilatation of the ascending aorta noted on CT chest. -Annual imaging followed by CTA or MRI recommended. -Findings were discussed with patient and son. -Will need outpatient follow-up with vascular surgery.  9.  Hypokalemia/hypomagnesemia -Potassium at 3.8 today, magnesium at 1.6.   -Magnesium sulfate 4 g IV x 1.   -Repeat labs in the AM.    10.  Memory issues -CT head with no acute intracranial abnormality. -Age-related cerebral atrophy with moderate to advanced chronic microvascular ischemic disease. -Check a RPR, RBC folate, vitamin B12, TSH. -SLP for cognitive evaluation. -Likely needs outpatient follow-up with neurology.   DVT prophylaxis: SCDs Code Status: Full Family Communication: Updated patient and son and son's girlfriend at bedside.  Disposition: TBD  Status is: Inpatient Remains inpatient appropriate because: Severity of illness.   Consultants:  None  Procedures:  CT head 05/12/2022 CT maxillofacial 05/12/2022 CT C-spine 05/12/2022 CT chest abdomen and pelvis 05/12/2022 Chest x-ray 05/12/2022 Plain films of the pelvis 05/12/2022 MRI brain 05/13/2022  Antimicrobials:  None   Subjective: Patient sleeping.  Son and his girlfriend at bedside.  Per son patient had a had some bouts of diarrhea and also noted to be agitated.  Patient now sleeping this morning and did not have any breakfast.  Patient arousable.  Per son patient usually sleeps mostly in the mornings and is up most of the night.  Per son patient also with some memory issues.  Denies any chest pain, no shortness of breath, no abdominal pain.  Family inquiring on continuous glucose monitor.  Objective: Vitals:   05/14/22 0740 05/14/22 1135 05/14/22 1619 05/14/22 1936  BP: 111/75 105/69 94/70 (!) 90/53  Pulse: 88 71 71 80  Resp: Temp: 98.8 F (37.1 C) 98.7 F (37.1 C) 98.7 F (37.1 C) 98.1 F (36.7 C)   TempSrc: Oral Oral Oral Oral  SpO2: 94% 91% 93% 93%  Weight:      Height:        Intake/Output Summary (Last 24 hours) at 05/14/2022 2052 Last data filed at 05/14/2022 1848 Gross per 24 hour  Intake 543 ml  Output 700 ml  Net -157 ml   Filed Weights   05/12/22 1818 05/12/22 2003 05/14/22 0318  Weight: 105.7 kg 105.6 kg 103.1 kg    Examination:  General exam: Appears calm and comfortable.  Periorbital ecchymosis.  Respiratory system: CTAB.  No wheezes, no crackles, no rhonchi.  Fair air movement.  Speaking in full sentences.  Cardiovascular system: Irregularly irregular.  No murmurs rubs or gallops.  No lower extremity edema.   Gastrointestinal system: Abdomen is soft, nontender, nondistended, positive bowel sounds.  No rebound.  No guarding. Central nervous system: Alert and oriented. No focal neurological deficits. Extremities: Symmetric 5 x 5 power. Skin: No rashes, lesions or ulcers Psychiatry: Judgement and insight appear fair. Mood & affect appropriate.     Data Reviewed: I have personally reviewed following labs and imaging studies  CBC: Recent Labs  Lab 05/12/22 1820 05/12/22 1830 05/13/22 0030 05/14/22 0058  WBC 4.0  --  3.1* 4.0  NEUTROABS  --   --   --  3.0  HGB 14.3 14.3 12.6* 14.8  HCT 43.1 42.0 38.3* 44.0  MCV 88.5  --  89.5 87.1  PLT 106*  --  88* 69*    Basic Metabolic Panel: Recent Labs  Lab 05/12/22 1820 05/12/22 1830 05/13/22 0030 05/14/22 0058  NA 135 136 134* 132*  K 3.6 3.5 3.3* 3.8  CL 101 100 102 99  CO2 22  --  21* 23  GLUCOSE 71 67* 78 115*  BUN 32* 34* 28* 13  CREATININE 1.49* 1.50* 1.24 1.00  CALCIUM 8.3*  --  7.4* 7.9*  MG  --   --  1.7 1.6*    GFR: Estimated Creatinine Clearance: 74.4 mL/min (by C-G formula based on SCr of 1 mg/dL).  Liver Function Tests: Recent Labs  Lab 05/12/22 1820 05/14/22 0814  AST 62* 66*  ALT 41 35  ALKPHOS 49 39  BILITOT 0.8 0.7  PROT 6.4* 5.9*  ALBUMIN 3.6 2.9*    CBG: Recent  Labs  Lab 05/14/22 0316 05/14/22 0743 05/14/22 1134 05/14/22 1618 05/14/22 2003  GLUCAP 121* 152* 125* 143* 144*     Recent Results (from the past 240 hour(s))  Resp panel by RT-PCR (RSV, Flu A&B, Covid) Anterior Nasal Swab     Status: None   Collection Time: 05/12/22  9:00 PM   Specimen: Anterior Nasal Swab  Result Value Ref Range Status   SARS Coronavirus 2 by RT PCR NEGATIVE NEGATIVE Final   Influenza A by PCR NEGATIVE NEGATIVE Final   Influenza B by PCR NEGATIVE NEGATIVE Final    Comment: (NOTE) The Xpert Xpress SARS-CoV-2/FLU/RSV plus assay is intended as an aid in the diagnosis of influenza from Nasopharyngeal swab specimens and should not be used as a sole basis for treatment. Nasal washings and aspirates are unacceptable for Xpert Xpress SARS-CoV-2/FLU/RSV testing.  Fact Sheet for Patients: BloggerCourse.com  Fact Sheet for Healthcare Providers: SeriousBroker.it  This test is not yet approved or cleared by the [place] FDA and has been authorized for detection and/or diagnosis of SARS-CoV-2 by FDA under an Emergency Use Authorization (EUA). This EUA will remain in effect (meaning this test can be used) for the duration of the COVID-19 declaration under Section 564(b)(1) of the Act, 21 U.S.C. section 360bbb-3(b)(1), unless the authorization is terminated or revoked.     Resp Syncytial Virus by PCR NEGATIVE NEGATIVE Final    Comment: (NOTE) Fact Sheet for Patients: BloggerCourse.com  Fact Sheet for Healthcare Providers: SeriousBroker.it  This test is not yet approved or cleared by the [place] FDA and has been authorized for detection and/or diagnosis of SARS-CoV-2 by FDA under an Emergency Use Authorization (EUA). This EUA will remain in effect (meaning this test can be used) for the duration of the COVID-19 declaration under Section 564(b)(1) of the  Act, 21 U.S.C. section 360bbb-3(b)(1), unless the authorization is terminated or revoked.  Performed at [facility], [address] [place], [place] [address]   Blood culture (routine x 2)     Status: None (Preliminary result)   Collection Time: 05/12/22 11:32 PM   Specimen: BLOOD  Result Value Ref Range Status   Specimen Description BLOOD SITE NOT SPECIFIED  Final   Special Requests   Final    BOTTLES DRAWN AEROBIC AND ANAEROBIC Blood Culture results may not be optimal due to an excessive volume of blood received in culture bottles   Culture   Final    NO GROWTH 1 DAY Performed at [facility], [address], [place], [place] [address]    Report Status PENDING  Incomplete  Blood culture (  routine x 2)     Status: None (Preliminary result)   Collection Time: 05/12/22 11:32 PM   Specimen: BLOOD  Result Value Ref Range Status   Specimen Description BLOOD SITE NOT SPECIFIED  Final   Special Requests   Final    BOTTLES DRAWN AEROBIC AND ANAEROBIC Blood Culture results may not be optimal due to an excessive volume of blood received in culture bottles   Culture   Final    NO GROWTH 1 DAY Performed at [facility], [address], [place], [place] [address]    Report Status PENDING  Incomplete         Radiology Studies: MR BRAIN WO CONTRAST  Result Date: 05/13/2022 CLINICAL DATA:  Initial evaluation for mental status change, unknown cause. Recent falls. EXAM: MRI HEAD WITHOUT CONTRAST TECHNIQUE: Multiplanar, multiecho pulse sequences of the brain and surrounding structures were obtained without intravenous contrast. COMPARISON:  Prior CTs from 05/12/2022. FINDINGS: Brain: Examination degraded by motion artifact. Diffuse prominence of the CSF containing spaces compatible generalized cerebral atrophy. Patchy and confluent T2/FLAIR hyperdensity involving the periventricular deep white matter both cerebral hemispheres, consistent with chronic small vessel ischemic  disease, moderate to advanced in nature. No abnormal foci of restricted diffusion to suggest acute or subacute ischemia. Gray-white matter differentiation maintained. No areas of chronic cortical infarction. No acute intracranial hemorrhage. Few punctate chronic micro hemorrhages noted about the right cerebellum and occipital regions, likely hypertensive and/or small vessel related. No mass lesion, midline shift or mass effect. Mild ventricular prominence related global parenchymal volume loss without hydrocephalus. No extra-axial fluid collection. Pituitary gland and suprasellar region within normal limits. Vascular: Major intracranial vascular flow voids are maintained. Skull and upper cervical spine: Craniocervical junction within normal limits. Signal changes at the dens related to a prominent geode formation noted, also seen on prior CT. Underlying bone marrow signal intensity within normal limits. Evolving right frontal scalp contusion/hematoma. Sinuses/Orbits: Globes and orbital soft tissues demonstrate no acute finding. Mild scattered mucoperiosteal thickening present throughout the paranasal sinuses. Trace left mastoid effusion, of doubtful significance. Other: None. IMPRESSION: 1. No acute intracranial abnormality. 2. Evolving right frontal scalp contusion/hematoma. 3. Age-related cerebral atrophy with moderate to advanced chronic microvascular ischemic disease. Electronically Signed   By: Rise Mu M.D.   On: 05/13/2022 03:55   CT HEAD WO CONTRAST  Result Date: 05/12/2022 CLINICAL DATA:  Head trauma, moderate-severe; Facial trauma, blunt; Neck trauma (Age >= 65y) EXAM: CT HEAD WITHOUT CONTRAST CT MAXILLOFACIAL WITHOUT CONTRAST CT CERVICAL SPINE WITHOUT CONTRAST TECHNIQUE: Multidetector CT imaging of the head, cervical spine, and maxillofacial structures were performed using the standard protocol without intravenous contrast. Multiplanar CT image reconstructions of the cervical spine and  maxillofacial structures were also generated. RADIATION DOSE REDUCTION: This exam was performed according to the departmental dose-optimization program which includes automated exposure control, adjustment of the mA and/or kV according to patient size and/or use of iterative reconstruction technique. COMPARISON:  CT head 09/25/2021 FINDINGS: CT HEAD FINDINGS Brain: Cerebral ventricle sizes are concordant with the degree of cerebral volume loss. Patchy and confluent areas of decreased attenuation are noted throughout the deep and periventricular white matter of the cerebral hemispheres bilaterally, compatible with chronic microvascular ischemic disease. No evidence of large-territorial acute infarction. No parenchymal hemorrhage. No mass lesion. No extra-axial collection. No mass effect or midline shift. No hydrocephalus. Basilar cisterns are patent. Vascular: No hyperdense vessel. Atherosclerotic calcifications are present within the cavernous internal carotid and vertebral arteries. Skull: No acute  fracture or focal lesion. Other: Right frontal scalp hematoma formation. CT MAXILLOFACIAL FINDINGS Osseous: No fracture or mandibular dislocation. No destructive process. Sinuses/Orbits: Paranasal sinuses and mastoid air cells are clear. The orbits are unremarkable. Soft tissues: Negative. CT CERVICAL SPINE FINDINGS Alignment: Normal. Skull base and vertebrae: Severe multilevel degenerative changes spine most prominent at the C5-C6 level. Multilevel moderate to severe osseous neural foraminal stenosis. No severe osseous central canal stenosis. No acute fracture. No aggressive appearing focal osseous lesion or focal pathologic process. Soft tissues and spinal canal: No prevertebral fluid or swelling. No visible canal hematoma. Upper chest: Interlobular septal wall thickening. Other: None. IMPRESSION: 1. No acute intracranial abnormality. 2.  No acute displaced facial fracture. 3. No acute displaced fracture or traumatic  listhesis of the cervical spine. 4. Likely mild pulmonary edema. Electronically Signed   By: Tish Frederickson M.D.   On: 05/12/2022 21:38   CT Cervical Spine Wo Contrast  Result Date: 05/12/2022 CLINICAL DATA:  Head trauma, moderate-severe; Facial trauma, blunt; Neck trauma (Age >= 65y) EXAM: CT HEAD WITHOUT CONTRAST CT MAXILLOFACIAL WITHOUT CONTRAST CT CERVICAL SPINE WITHOUT CONTRAST TECHNIQUE: Multidetector CT imaging of the head, cervical spine, and maxillofacial structures were performed using the standard protocol without intravenous contrast. Multiplanar CT image reconstructions of the cervical spine and maxillofacial structures were also generated. RADIATION DOSE REDUCTION: This exam was performed according to the departmental dose-optimization program which includes automated exposure control, adjustment of the mA and/or kV according to patient size and/or use of iterative reconstruction technique. COMPARISON:  CT head 09/25/2021 FINDINGS: CT HEAD FINDINGS Brain: Cerebral ventricle sizes are concordant with the degree of cerebral volume loss. Patchy and confluent areas of decreased attenuation are noted throughout the deep and periventricular white matter of the cerebral hemispheres bilaterally, compatible with chronic microvascular ischemic disease. No evidence of large-territorial acute infarction. No parenchymal hemorrhage. No mass lesion. No extra-axial collection. No mass effect or midline shift. No hydrocephalus. Basilar cisterns are patent. Vascular: No hyperdense vessel. Atherosclerotic calcifications are present within the cavernous internal carotid and vertebral arteries. Skull: No acute fracture or focal lesion. Other: Right frontal scalp hematoma formation. CT MAXILLOFACIAL FINDINGS Osseous: No fracture or mandibular dislocation. No destructive process. Sinuses/Orbits: Paranasal sinuses and mastoid air cells are clear. The orbits are unremarkable. Soft tissues: Negative. CT CERVICAL SPINE  FINDINGS Alignment: Normal. Skull base and vertebrae: Severe multilevel degenerative changes spine most prominent at the C5-C6 level. Multilevel moderate to severe osseous neural foraminal stenosis. No severe osseous central canal stenosis. No acute fracture. No aggressive appearing focal osseous lesion or focal pathologic process. Soft tissues and spinal canal: No prevertebral fluid or swelling. No visible canal hematoma. Upper chest: Interlobular septal wall thickening. Other: None. IMPRESSION: 1. No acute intracranial abnormality. 2.  No acute displaced facial fracture. 3. No acute displaced fracture or traumatic listhesis of the cervical spine. 4. Likely mild pulmonary edema. Electronically Signed   By: Tish Frederickson M.D.   On: 05/12/2022 21:38   CT Maxillofacial Wo Contrast  Result Date: 05/12/2022 CLINICAL DATA:  Head trauma, moderate-severe; Facial trauma, blunt; Neck trauma (Age >= 65y) EXAM: CT HEAD WITHOUT CONTRAST CT MAXILLOFACIAL WITHOUT CONTRAST CT CERVICAL SPINE WITHOUT CONTRAST TECHNIQUE: Multidetector CT imaging of the head, cervical spine, and maxillofacial structures were performed using the standard protocol without intravenous contrast. Multiplanar CT image reconstructions of the cervical spine and maxillofacial structures were also generated. RADIATION DOSE REDUCTION: This exam was performed according to the departmental dose-optimization program which includes automated exposure control,  adjustment of the mA and/or kV according to patient size and/or use of iterative reconstruction technique. COMPARISON:  CT head 09/25/2021 FINDINGS: CT HEAD FINDINGS Brain: Cerebral ventricle sizes are concordant with the degree of cerebral volume loss. Patchy and confluent areas of decreased attenuation are noted throughout the deep and periventricular white matter of the cerebral hemispheres bilaterally, compatible with chronic microvascular ischemic disease. No evidence of large-territorial acute  infarction. No parenchymal hemorrhage. No mass lesion. No extra-axial collection. No mass effect or midline shift. No hydrocephalus. Basilar cisterns are patent. Vascular: No hyperdense vessel. Atherosclerotic calcifications are present within the cavernous internal carotid and vertebral arteries. Skull: No acute fracture or focal lesion. Other: Right frontal scalp hematoma formation. CT MAXILLOFACIAL FINDINGS Osseous: No fracture or mandibular dislocation. No destructive process. Sinuses/Orbits: Paranasal sinuses and mastoid air cells are clear. The orbits are unremarkable. Soft tissues: Negative. CT CERVICAL SPINE FINDINGS Alignment: Normal. Skull base and vertebrae: Severe multilevel degenerative changes spine most prominent at the C5-C6 level. Multilevel moderate to severe osseous neural foraminal stenosis. No severe osseous central canal stenosis. No acute fracture. No aggressive appearing focal osseous lesion or focal pathologic process. Soft tissues and spinal canal: No prevertebral fluid or swelling. No visible canal hematoma. Upper chest: Interlobular septal wall thickening. Other: None. IMPRESSION: 1. No acute intracranial abnormality. 2.  No acute displaced facial fracture. 3. No acute displaced fracture or traumatic listhesis of the cervical spine. 4. Likely mild pulmonary edema. Electronically Signed   By: Tish Frederickson M.D.   On: 05/12/2022 21:38   CT CHEST ABDOMEN PELVIS W CONTRAST  Result Date: 05/12/2022 CLINICAL DATA:  Polytrauma, blunt.  Fall. EXAM: CT CHEST, ABDOMEN, AND PELVIS WITH CONTRAST TECHNIQUE: Multidetector CT imaging of the chest, abdomen and pelvis was performed following the standard protocol during bolus administration of intravenous contrast. RADIATION DOSE REDUCTION: This exam was performed according to the departmental dose-optimization program which includes automated exposure control, adjustment of the mA and/or kV according to patient size and/or use of iterative  reconstruction technique. CONTRAST:  90mL OMNIPAQUE IOHEXOL 350 MG/ML SOLN COMPARISON:  01/18/2017. FINDINGS: CT CHEST FINDINGS Cardiovascular: Heart is enlarged and there is no pericardial effusion. Coronary artery calcifications are noted. 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 The pulmonary trunk is normal in caliber. Mediastinum/Nodes: No enlarged mediastinal, hilar, or axillary lymph nodes. Thyroid gland, trachea, and esophagus demonstrate no significant findings. Lungs/Pleura: Atelectasis is present bilaterally. No effusion or pneumothorax. There is a subpleural nodule in the right middle lobe measuring 6 mm, axial image 88, unchanged from 2018 and likely benign. Musculoskeletal: Degenerative changes are present in the thoracic spine. No acute fracture. CT ABDOMEN PELVIS FINDINGS Hepatobiliary: No focal liver abnormality is seen. No gallstones, gallbladder wall thickening, or biliary dilatation. Pancreas: Unremarkable. No pancreatic ductal dilatation or surrounding inflammatory changes. Spleen: No splenic injury or perisplenic hematoma. Adrenals/Urinary Tract: The adrenal glands are within normal limits. Multiple cysts are present within the kidneys. No renal calculus or hydronephrosis. Bladder is unremarkable. Stomach/Bowel: Stomach is within normal limits. Appendix appears normal. No evidence of bowel wall thickening, distention, or inflammatory changes. No free air or pneumatosis. Vascular/Lymphatic: Aortic atherosclerosis. No enlarged abdominal or pelvic lymph nodes. Reproductive: Prostate is normal in size. Brachytherapy seeds are noted. Other: No abdominopelvic ascites. Musculoskeletal: Degenerative changes in the lumbar spine. No acute osseous abnormality. IMPRESSION: 1. No evidence of acute fracture or solid organ injury. 2. Aortic atherosclerosis with aneurysmal dilatation of  the ascending aorta measuring 4.1 cm.  Recommend annual imaging followup by CTA or MRA. This recommendation follows 2010 ACCF/AHA/AATS/ACR/ASA/SCA/SCAI/SIR/STS/SVM Guidelines for the Diagnosis and Management of Patients with Thoracic Aortic Disease. Circulation. 2010; 121: Z610-R604. Aortic aneurysm NOS (ICD10-I71.9) 3. Coronary artery calcifications. Electronically Signed   By: Thornell Sartorius M.D.   On: 05/12/2022 21:21        Scheduled Meds:  living well with diabetes book   Does not apply Once   metoprolol tartrate  25 mg Oral BID   sodium chloride flush  3 mL Intravenous Q12H   Continuous Infusions:     LOS: 1 day    Time spent: 40 minutes    Ramiro Harvest, MD [facility]   To contact the attending provider between 7A-7P or the covering provider during after hours 7P-7A, please log into the web site [email or web address] and access using universal [place] password for that web site. If you do not have the password, please call the hospital operator.  05/14/2022, 8:52 PM

## 2022-05-15 DIAGNOSIS — N179 Acute kidney failure, unspecified: Secondary | ICD-10-CM | POA: Diagnosis not present

## 2022-05-15 DIAGNOSIS — G9341 Metabolic encephalopathy: Secondary | ICD-10-CM | POA: Diagnosis not present

## 2022-05-15 DIAGNOSIS — E861 Hypovolemia: Secondary | ICD-10-CM | POA: Diagnosis present

## 2022-05-15 DIAGNOSIS — R338 Other retention of urine: Secondary | ICD-10-CM | POA: Diagnosis present

## 2022-05-15 DIAGNOSIS — D696 Thrombocytopenia, unspecified: Secondary | ICD-10-CM | POA: Diagnosis not present

## 2022-05-15 LAB — GLUCOSE, CAPILLARY
Glucose-Capillary: 173 mg/dL — ABNORMAL HIGH (ref 70–99)
Glucose-Capillary: 137 mg/dL — ABNORMAL HIGH (ref 70–99)
Glucose-Capillary: 96 mg/dL (ref 70–99)
Glucose-Capillary: 137 mg/dL — ABNORMAL HIGH (ref 70–99)
Glucose-Capillary: 123 mg/dL — ABNORMAL HIGH (ref 70–99)
Glucose-Capillary: 121 mg/dL — ABNORMAL HIGH (ref 70–99)

## 2022-05-15 LAB — COMPREHENSIVE METABOLIC PANEL
ALT: 33 U/L (ref 0–44)
AST: 57 U/L — ABNORMAL HIGH (ref 15–41)
Albumin: 2.9 g/dL — ABNORMAL LOW (ref 3.5–5.0)
Alkaline Phosphatase: 38 U/L (ref 38–126)
Anion gap: 8 (ref 5–15)
BUN: 24 mg/dL — ABNORMAL HIGH (ref 8–23)
CO2: 25 mmol/L (ref 22–32)
Calcium: 7.7 mg/dL — ABNORMAL LOW (ref 8.9–10.3)
Chloride: 100 mmol/L (ref 98–111)
Creatinine, Ser: 1.56 mg/dL — ABNORMAL HIGH (ref 0.61–1.24)
GFR, Estimated: 46 mL/min — ABNORMAL LOW (ref >60)
Glucose, Bld: 188 mg/dL — ABNORMAL HIGH (ref 70–99)
Potassium: 3.7 mmol/L (ref 3.5–5.1)
Sodium: 133 mmol/L — ABNORMAL LOW (ref 135–145)
Total Bilirubin: 0.7 mg/dL (ref 0.3–1.2)
Total Protein: 5.4 g/dL — ABNORMAL LOW (ref 6.5–8.1)

## 2022-05-15 LAB — PROCALCITONIN: Procalcitonin: <0.1 ng/mL

## 2022-05-15 LAB — CBC WITH DIFFERENTIAL/PLATELET
Abs Immature Granulocytes: 0.01 10*3/uL (ref 0.00–0.07)
Basophils Absolute: 0 10*3/uL (ref 0.0–0.1)
Basophils Relative: 0 %
Eosinophils Absolute: 0.1 10*3/uL (ref 0.0–0.5)
Eosinophils Relative: 2 %
HCT: 43.2 % (ref 39.0–52.0)
Hemoglobin: 14.5 g/dL (ref 13.0–17.0)
Immature Granulocytes: 0 %
Lymphocytes Relative: 57 %
Lymphs Abs: 1.9 10*3/uL (ref 0.7–4.0)
MCH: 29.3 pg (ref 26.0–34.0)
MCHC: 33.6 g/dL (ref 30.0–36.0)
MCV: 87.3 fL (ref 80.0–100.0)
Monocytes Absolute: 0.2 10*3/uL (ref 0.1–1.0)
Monocytes Relative: 5 %
Neutro Abs: 1.2 10*3/uL — ABNORMAL LOW (ref 1.7–7.7)
Neutrophils Relative %: 36 %
Platelets: 72 10*3/uL — ABNORMAL LOW (ref 150–400)
RBC: 4.95 MIL/uL (ref 4.22–5.81)
RDW: 13.2 % (ref 11.5–15.5)
WBC: 3.3 10*3/uL — ABNORMAL LOW (ref 4.0–10.5)
nRBC: 0 % (ref 0.0–0.2)

## 2022-05-15 LAB — CULTURE, BLOOD (ROUTINE X 2): Culture: NO GROWTH

## 2022-05-15 LAB — MAGNESIUM: Magnesium: 2.5 mg/dL — ABNORMAL HIGH (ref 1.7–2.4)

## 2022-05-15 LAB — AMMONIA: Ammonia: 36 umol/L — ABNORMAL HIGH (ref 9–35)

## 2022-05-15 LAB — RPR: RPR Ser Ql: NONREACTIVE

## 2022-05-15 MED ORDER — ALBUMIN HUMAN 25 % IV SOLN
25.0000 g | Freq: Once | INTRAVENOUS | Status: AC
  Administered 2022-05-15: 25 g via INTRAVENOUS
  Filled 2022-05-15: qty 100

## 2022-05-15 MED ORDER — FLUTICASONE PROPIONATE 50 MCG/ACT NA SUSP: 2.0000 | Freq: Every day | NASAL | Status: DC | PRN

## 2022-05-15 MED ORDER — SODIUM CHLORIDE 0.9 % IV BOLUS
1000.0000 mL | Freq: Once | INTRAVENOUS | Status: AC
  Administered 2022-05-15: 1000 mL via INTRAVENOUS

## 2022-05-15 MED ORDER — MENTHOL 3 MG MT LOZG
1.0000 | LOZENGE | OROMUCOSAL | Status: DC | PRN
  Filled 2022-05-15: qty 9

## 2022-05-15 MED ORDER — SODIUM CHLORIDE 0.9 % IV SOLN: INTRAVENOUS | Status: AC

## 2022-05-15 MED ORDER — FESOTERODINE FUMARATE ER 4 MG PO TB24
4.0000 mg | ORAL_TABLET | Freq: Every day | ORAL | Status: DC
  Administered 2022-05-15 – 2022-05-22 (×8): 4 mg via ORAL
  Filled 2022-05-15 (×8): qty 1

## 2022-05-15 MED ORDER — QUETIAPINE FUMARATE 25 MG PO TABS
25.0000 mg | ORAL_TABLET | Freq: Every evening | ORAL | Status: DC | PRN
  Administered 2022-05-15 – 2022-05-20 (×6): 25 mg via ORAL
  Filled 2022-05-15 (×6): qty 1

## 2022-05-15 MED ORDER — APIXABAN 2.5 MG PO TABS
2.5000 mg | ORAL_TABLET | Freq: Two times a day (BID) | ORAL | Status: DC
  Administered 2022-05-15 – 2022-05-19 (×8): 2.5 mg via ORAL
  Filled 2022-05-15 (×8): qty 1

## 2022-05-15 NOTE — Progress Notes (Addendum)
PROGRESS NOTE    James Dyer  WUJ:[id number] DOB: 10/09/1945 DOA: 05/12/2022 PCP: James Found, MD    Chief Complaint  Patient presents with   Level 2-FOT   Fall    Brief Narrative:  James Dyer is a 77 y.o. male with medical history significant for PAF on Eliquis, T2DM, HTN, HLD, chronic bilateral foot drop, history of prostate cancer who is admitted with AKI and altered mental status after falling on his face at home.     Assessment & Plan:   Principal Problem:   Acute kidney injury Active Problems:   HTN (hypertension)   Type 2 diabetes mellitus with hypoglycemia   Paroxysmal atrial fibrillation   Elevated CK   Fall at home, initial encounter   Altered mental status   Thrombocytopenia   Fever   AKI (acute kidney injury)   Acute metabolic encephalopathy   Hypoglycemia   Fall   Hypomagnesemia   Hypotension due to hypovolemia   Acute urinary retention  #1 acute kidney injury -Felt likely secondary to prerenal azotemia as patient noted to be volume depleted on examination in the setting of fall. -Creatinine on admission was 1.49, baseline approximately 1.1, CK at 419. -Renal function initially improved with hydration with CK levels trending down.   -Patient with a bump in creatinine up to 1.56 this morning likely secondary to prerenal azotemia in addition to possible postrenal azotemia as patient with a urinary retention, patient noted to be hypotensive overnight and this morning currently receiving IV fluids and IV albumin.   -Place Foley catheter.   -Continue IV fluids.   -Repeat labs in the morning.   -Supportive care.    2.  Acute metabolic encephalopathy -Patient noted to have reported some mental status changes after fall on his face at home. -Likely secondary to hypoglycemia (per family CBGs noted in the 30s on initial presentation to the ED) versus postconcussive syndrome. -Head CT negative for any acute changes. -MRI brain negative for any acute  abnormalities. -Mental status initially improved.   -Patient lethargic and drowsy this morning however patient also noted to be hypotensive which may be playing a role.   -IV albumin and IV fluids ordered.   -Follow.  3.  Hypotension -Patient noted to be hypotensive overnight and systolic blood pressures noted in the 70s to 80s. -Likely hypovolemic hypotension as patient has been pancultured with no signs or symptoms of infection. -TSH within normal limits. -Patient hemoglobin stable, with no bleeding. -Check a random cortisol in the AM. -IV given x 1. -Place on IV fluids. -Supportive care.  4.  Fall at home/chronic bilateral foot drop -Patient noted to have had multiple falls at home resulting in ecchymosis periorbitally. -Patient underwent extensive imaging on admission with no significant fracture or traumatic injury noted. -Unclear circumstances of fall however patient noted to have CBGs in the 30s on presentation per son. -Chronic foot drop and increasing risk of fall. -MRI brain done negative for any acute abnormalities. -PT/OT recommending evaluation for acute inpatient rehab. -Fall precautions.  5.  Type 2 diabetes mellitus with hypoglycemic episodes -Per son patient noted to be hypoglycemic with CBGs in the 30s on presentation to the ED. -Hypoglycemia likely contributed to acute encephalopathy and falls. -CBG recorded on chart on presentation was 45. -Patient received D50 amp with some improvement with blood glucose levels. -Patient received another amp of D50. -IV fluids changed to D5 normal saline. -Hemoglobin A1c 5.9. -Continue to hold home regimen Amaryl and likely will not  resume on discharge. -CBGs improved and as such we will change CBGs to before meals and at bedtime.  -Patient seen by diabetic coordinator who has discussed continuous glucose monitor with family.  -Appreciate diabetic coordinator input and recommendations.   -Supportive care.  6.  Fever -Patient  noted to have a Tmax of 102.5 rectally while in the ED. -Urinalysis negative for UTI. -Extensive imaging done negative for any obvious infection. -Patient with no leukocytosis noted. -COVID-19 PCR negative, RSV PCR negative, influenza A and B PCR negative. -Fever curve trending down. -Blood cultures ordered and pending with no growth to date x 2 day.. -Continue to monitor off antibiotics.    7.  Paroxysmal atrial fibrillation -Continue Lopressor for rate control. -Eliquis on hold due to new thrombocytopenia and periorbital ecchymosis. -Platelet count seems to be stabilizing.   -Per hematology no contraindication to resuming anticoagulation as long as platelet count is greater than 50,000.  Will resume Eliquis at 2.5 twice daily until platelet count is greater than 100,000 and back to regular dose of 5 mg twice daily.   8.  Thrombocytopenia -Eliquis on hold. -LDH noted of 413.  Haptoglobin pending.  Peripheral smear ordered.   -Platelet count seems to be stabilizing.   -Consult with hematology for further evaluation and management.    9.  Ascending aortic aneurysm -Patient with a 4.1 cm aneurysmal dilatation of the ascending aorta noted on CT chest. -Annual imaging followed by CTA or MRI recommended. -Findings were discussed with patient and son. -Will need outpatient follow-up with vascular surgery.  10.  Hypokalemia/hypomagnesemia -Potassium at 3.7, magnesium at 2.5.    -Repeat labs in the AM.    11.  Memory issues -CT head with no acute intracranial abnormality. -Age-related cerebral atrophy with moderate to advanced chronic microvascular ischemic disease. -RPR nonreactive.  HIV nonreactive.  Folate of 26.3, vitamin B12 of 377, TSH 2.324.   -SLP consulted for cognitive evaluation.   -Ambulatory outpatient referral with neurology placed.  12.  Urinary retention -Place Foley catheter. -Will hold off on Flomax for now until BP has improved. -Voiding trial in 5 days. -May need  outpatient follow-up with urology.   DVT prophylaxis: SCDs Code Status: Full Family Communication: Updated patient and son and son's girlfriend at bedside.  Disposition: TBD  Status is: Inpatient Remains inpatient appropriate because: Severity of illness.   Consultants:  Hematology: Dr.Gorsuch 05/15/2022  Procedures:  CT head 05/12/2022 CT maxillofacial 05/12/2022 CT C-spine 05/12/2022 CT chest abdomen and pelvis 05/12/2022 Chest x-ray 05/12/2022 Plain films of the pelvis 05/12/2022 MRI brain 05/13/2022  Antimicrobials:  None   Subjective: Patient sleeping but arousable.  Somewhat drowsy and lethargic.  Systolic blood pressures in the 70s.  Hypotensive episode noted this morning.  Son at bedside stated patient ate a good breakfast this morning but is drowsy again.  Patient also noted to have some urinary retention I and O cath with about 700 cc of urine, noted early on this morning.  Objective: Vitals:   05/15/22 1600 05/15/22 1615 05/15/22 1630 05/15/22 1645  BP: 97/75  120/84   Pulse:  84 63 79  Resp:      Temp:      TempSrc:      SpO2:  91% (!) 86% 96%  Weight:      Height:        Intake/Output Summary (Last 24 hours) at 05/15/2022 1809 Last data filed at 05/15/2022 1321 Gross per 24 hour  Intake 390 ml  Output 200 ml  Net 190 ml   Filed Weights   05/12/22 2003 05/14/22 0318 05/15/22 0348  Weight: 105.6 kg 103.1 kg 103.4 kg    Examination:  General exam: Drowsy but arousable.  Periorbital ecchymosis.  Respiratory system: Lungs clear to auscultation bilaterally.  No wheezes, no crackles, no rhonchi.  Fair air movement.  Speaking in full sentences.  Cardiovascular system: Irregularly irregular.  No murmurs rubs or gallops.  No lower extremity edema.   Gastrointestinal system: Abdomen is soft, nontender, nondistended, positive bowel sounds.  No rebound.  No guarding.  Central nervous system: Alert and oriented. No focal neurological deficits. Extremities: Symmetric  5 x 5 power. Skin: No rashes, lesions or ulcers Psychiatry: Judgement and insight appear fair. Mood & affect appropriate.     Data Reviewed: I have personally reviewed following labs and imaging studies  CBC: Recent Labs  Lab 05/12/22 1820 05/12/22 1830 05/13/22 0030 05/14/22 0058 05/15/22 0057  WBC 4.0  --  3.1* 4.0 3.3*  NEUTROABS  --   --   --  3.0 1.2*  HGB 14.3 14.3 12.6* 14.8 14.5  HCT 43.1 42.0 38.3* 44.0 43.2  MCV 88.5  --  89.5 87.1 87.3  PLT 106*  --  88* 69* 72*    Basic Metabolic Panel: Recent Labs  Lab 05/12/22 1820 05/12/22 1830 05/13/22 0030 05/14/22 0058 05/15/22 0057  NA 135 136 134* 132* 133*  K 3.6 3.5 3.3* 3.8 3.7  CL 101 100 102 99 100  CO2 22  --  21* 23 25  GLUCOSE 71 67* 78 115* 188*  BUN 32* 34* 28* 13 24*  CREATININE 1.49* 1.50* 1.24 1.00 1.56*  CALCIUM 8.3*  --  7.4* 7.9* 7.7*  MG  --   --  1.7 1.6* 2.5*    GFR: Estimated Creatinine Clearance: 47.7 mL/min (A) (by C-G formula based on SCr of 1.56 mg/dL (H)).  Liver Function Tests: Recent Labs  Lab 05/12/22 1820 05/14/22 0814 05/15/22 0057  AST 62* 66* 57*  ALT 41 35 33  ALKPHOS 49 39 38  BILITOT 0.8 0.7 0.7  PROT 6.4* 5.9* 5.4*  ALBUMIN 3.6 2.9* 2.9*    CBG: Recent Labs  Lab 05/15/22 0002 05/15/22 0357 05/15/22 0758 05/15/22 1058 05/15/22 1548  GLUCAP 173* 137* 96 137* 123*     Recent Results (from the past 240 hour(s))  Resp panel by RT-PCR (RSV, Flu A&B, Covid) Anterior Nasal Swab     Status: None   Collection Time: 05/12/22  9:00 PM   Specimen: Anterior Nasal Swab  Result Value Ref Range Status   SARS Coronavirus 2 by RT PCR NEGATIVE NEGATIVE Final   Influenza A by PCR NEGATIVE NEGATIVE Final   Influenza B by PCR NEGATIVE NEGATIVE Final    Comment: (NOTE) The Xpert Xpress SARS-CoV-2/FLU/RSV plus assay is intended as an aid in the diagnosis of influenza from Nasopharyngeal swab specimens and should not be used as a sole basis for treatment. Nasal washings  and aspirates are unacceptable for Xpert Xpress SARS-CoV-2/FLU/RSV testing.  Fact Sheet for Patients: BloggerCourse.com  Fact Sheet for Healthcare Providers: SeriousBroker.it  This test is not yet approved or cleared by the [place] FDA and has been authorized for detection and/or diagnosis of SARS-CoV-2 by FDA under an Emergency Use Authorization (EUA). This EUA will remain in effect (meaning this test can be used) for the duration of the COVID-19 declaration under Section 564(b)(1) of the Act, 21 U.S.C. section 360bbb-3(b)(1), unless the authorization is terminated or revoked.  Resp Syncytial Virus by PCR NEGATIVE NEGATIVE Final    Comment: (NOTE) Fact Sheet for Patients: BloggerCourse.com  Fact Sheet for Healthcare Providers: SeriousBroker.it  This test is not yet approved or cleared by the [place] FDA and has been authorized for detection and/or diagnosis of SARS-CoV-2 by FDA under an Emergency Use Authorization (EUA). This EUA will remain in effect (meaning this test can be used) for the duration of the COVID-19 declaration under Section 564(b)(1) of the Act, 21 U.S.C. section 360bbb-3(b)(1), unless the authorization is terminated or revoked.  Performed at [facility], [address] [place], [place] [address]   Blood culture (routine x 2)     Status: None (Preliminary result)   Collection Time: 05/12/22 11:32 PM   Specimen: BLOOD  Result Value Ref Range Status   Specimen Description BLOOD SITE NOT SPECIFIED  Final   Special Requests   Final    BOTTLES DRAWN AEROBIC AND ANAEROBIC Blood Culture results may not be optimal due to an excessive volume of blood received in culture bottles   Culture   Final    NO GROWTH 2 DAYS Performed at [facility], [address] [place], [place] [address]    Report Status PENDING  Incomplete  Blood  culture (routine x 2)     Status: None (Preliminary result)   Collection Time: 05/12/22 11:32 PM   Specimen: BLOOD  Result Value Ref Range Status   Specimen Description BLOOD SITE NOT SPECIFIED  Final   Special Requests   Final    BOTTLES DRAWN AEROBIC AND ANAEROBIC Blood Culture results may not be optimal due to an excessive volume of blood received in culture bottles   Culture   Final    NO GROWTH 2 DAYS Performed at [facility], [address], [place], [place] [address]    Report Status PENDING  Incomplete         Radiology Studies: No results D[place].      Scheduled Meds:  fesoterodine  4 mg Oral Daily   sodium chloride flush  3 mL Intravenous Q12H   Continuous Infusions:  sodium chloride 125 mL/hr at 05/15/22 1808      LOS: 2 days    Time spent: 45 minutes    Ramiro Harvest, MD [facility]   To contact the attending provider between 7A-7P or the covering provider during after hours 7P-7A, please log into the web site [email or web address] and access using universal [place] password for that web site. If you do not have the password, please call the hospital operator.  05/15/2022, 6:09 PM

## 2022-05-15 NOTE — Consult Note (Signed)
[facility] CONSULT NOTE  Patient Care Team: [name], MD as PCP - General (Family Medicine) Laqueta Linden, MD (Inactive) as Attending Physician (Cardiology)  ASSESSMENT & PLAN Acute thrombocytopenia His overall cause of thrombocytopenia is likely due to consumption from recent facial injury Monitor closely There is no contraindication to remain on antiplatelet agents or anticoagulants as long as the platelet is greater than 50,000 There is no indication for transfusion support unless he has signs of bleeding or platelet count is less than 10,000  Mild intermittent leukopenia Cause is unknown Observe for now  Chronic anticoagulation therapy due to chronic atrial fibrillation Consider restarting back on Eliquis at low-dose  Mild acute on chronic renal failure Could be due to dehydration I recommend increase oral fluid hydration as tolerated  History of prostate cancer Currently not active  Discharge planning Will defer to primary service It could take a while before his platelet count improves If his platelet count remained stable over 50,000 over the next 2 days, he can be discharged with outpatient follow-up  All questions were answered. The patient knows to call the clinic with any problems, questions or concerns. No barriers to learning was detected. The total time spent in the appointment was 60 minutes encounter with patients including review of chart and various tests results, discussions about plan of care and coordination of care plan  Artis Delay, MD 05/15/2022 4:55 PM  CHIEF COMPLAINTS/PURPOSE OF CONSULTATION:  Acute thrombocytopenia  HISTORY OF PRESENTING ILLNESS:  James Dyer 77 y.o. male is seen because of thrombocytopenia. The patient is seen in the hospital for consultation for acute thrombocytopenia He was being admitted 3 days ago after a fall The patient takes chronic anticoagulation therapy for chronic atrial  fibrillation He also have history of prostate cancer being observed closely by urologist His baseline platelet count was normal, CBC from February 21, 2021 showed platelet count of 202 On admission, his platelet count was reduced at 106 Since admission, his platelet count has started to trend down to as low as 69,000 yesterday, improved to 72,000 His white count was also borderline intermittently low He had numerous imaging studies performed for evaluation from recent fall He has mild intermittent acute renal failure.  According to the patient and his wife, he drinks on average less than 20 ounces of liquid He could not remember the precipitating events leading to the fall He does not remember whether he might have syncopal episode He could not remember whether he tripped over objects or just weak on his legs The patient have bilateral foot drop and uses a brace He takes Eliquis at home for years He denies recent spontaneous bruising/bleeding, such as spontaneous epistaxis, hematuria, melena or hematochezia  MEDICAL HISTORY:  Past Medical History:  Diagnosis Date   Arthritis    HNP- lumbar   Borderline diabetes    Cancer 2013   prostate , treated /w radiation    Diabetes mellitus without complication    Foot drop, bilateral since birthpt wears braces in shoes   pt must have braces to walk   Hepatitis    "pt told had been exposed to hepatitis when he went to donate blood"   History of kidney stones    History of radiation therapy 04/12/11 - 06/06/11   prostate, seminal vesicles   History of stress test    Echocardiogram done - 03/2013   Hypercholesterolemia    states Dr. Renette Butters Rx Welchol- pt. hs stopped taking because he believe  it gives him  leg cramps.   Hypertension    Nephrolithiasis    renal calculi- , also has been told that he has a cyst on kidney - R side    Neuromuscular disorder    bilateral foot drop- both feet    Paroxysmal atrial fibrillation     SURGICAL  HISTORY: Past Surgical History:  Procedure Laterality Date   BACK SURGERY     CARDIOVERSION N/A 02/28/2021   Procedure: CARDIOVERSION;  Surgeon: Thurmon Fair, MD;  Location: [facility];  Service: Cardiovascular;  Laterality: N/A;   COLONOSCOPY N/A 07/22/2012   Procedure: COLONOSCOPY;  Surgeon: Dalia Heading, MD;  Location: [facility];  Service: Gastroenterology;  Laterality: N/A;   ESOPHAGEAL DILATION N/A 04/22/2019   Procedure: ESOPHAGEAL DILATION;  Surgeon: Malissa Hippo, MD;  Location: [facility];  Service: Endoscopy;  Laterality: N/A;   ESOPHAGOGASTRODUODENOSCOPY N/A 04/22/2019   Procedure: ESOPHAGOGASTRODUODENOSCOPY (EGD);  Surgeon: Malissa Hippo, MD;  Location: [facility];  Service: Endoscopy;  Laterality: N/A;  145   EXTRACORPOREAL SHOCK WAVE LITHOTRIPSY Left 03/04/2017   Procedure: LEFT EXTRACORPOREAL SHOCK WAVE LITHOTRIPSY (ESWL);  Surgeon: Rene Paci, MD;  Location: [facility];  Service: Urology;  Laterality: Left;   FOOT SURGERY Right 1980's   calcium deposit removed   LUMBAR LAMINECTOMY/DECOMPRESSION MICRODISCECTOMY Right 09/01/2013   Procedure: RIGHT LUMBAR FOUR-FIVE LAMINECTOMY/DECOMPRESSION MICRODISCECTOMY 1 LEVEL;  Surgeon: Coletta Memos, MD;  Location: [facility];  Service: Neurosurgery;  Laterality: Right;  Right L45 microdiskectomy   prostate biopsy  2013   SHOULDER OPEN ROTATOR CUFF REPAIR Left 09/09/2012   Procedure: LEFT ROTATOR CUFF REPAIR SHOULDER OPEN;  Surgeon: Jacki Cones, MD;  Location: [facility];  Service: Orthopedics;  Laterality: Left;    SOCIAL HISTORY: Social History   Socioeconomic History   Marital status: Married    Spouse name: Not on file   Number of children: Not on file   Years of education: Not on file   Highest education level: Not on file  Occupational History   Not on file  Tobacco Use   Smoking status: Former    Packs/day: 1.00    Years: 5.00    Additional pack years: 0.00    Total pack years: 5.00     Types: Cigarettes    Quit date: 12/18/1966    Years since quitting: 55.4   Smokeless tobacco: Never  Vaping Use   Vaping Use: Never used  Substance and Sexual Activity   Alcohol use: Yes    Comment: occasional beer   Drug use: No   Sexual activity: Never  Other Topics Concern   Not on file  Social History Narrative   Not on file   Social Determinants of Health   Financial Resource Strain: Not on file  Food Insecurity: No Food Insecurity (05/13/2022)   Hunger Vital Sign    Worried About Running Out of Food in the Last Year: Never true    Ran Out of Food in the Last Year: Never true  Transportation Needs: No Transportation Needs (05/13/2022)   PRAPARE - Administrator, Civil Service (Medical): No    Lack of Transportation (Non-Medical): No  Physical Activity: Not on file  Stress: Not on file  Social Connections: Not on file  Intimate Partner Violence: Not At Risk (05/13/2022)   Humiliation, Afraid, Rape, and Kick questionnaire    Fear of Current or Ex-Partner: No    Emotionally Abused: No    Physically Abused:  No    Sexually Abused: No    FAMILY HISTORY: History reviewed. No pertinent family history.  ALLERGIES:  is allergic to latex.  MEDICATIONS:  Current Facility-Administered Medications  Medication Dose Route Frequency Provider Last Rate Last Admin   0.9 %  sodium chloride infusion   Intravenous Continuous Rodolph Bong, MD 125 mL/hr at 05/15/22 0931 New Bag at 05/15/22 0931   acetaminophen (TYLENOL) tablet 650 mg  650 mg Oral Q6H PRN Charlsie Quest, MD       Or   acetaminophen (TYLENOL) suppository 650 mg  650 mg Rectal Q6H PRN Darreld Mclean R, MD       dextrose 50 % solution 12.5 g  12.5 g Intravenous PRN Darreld Mclean R, MD   12.5 g at 05/13/22 0509   fesoterodine (TOVIAZ) tablet 4 mg  4 mg Oral Daily Rodolph Bong, MD   4 mg at 05/15/22 1106   fluticasone (FLONASE) 50 MCG/ACT nasal spray 2 spray  2 spray Each Nare Daily PRN Rodolph Bong, MD       LORazepam (ATIVAN) injection 1 mg  1 mg Intravenous Once PRN Charlsie Quest, MD       ondansetron (ZOFRAN) tablet 4 mg  4 mg Oral Q6H PRN Charlsie Quest, MD       Or   ondansetron (ZOFRAN) injection 4 mg  4 mg Intravenous Q6H PRN Charlsie Quest, MD       phenol (CHLORASEPTIC) mouth spray 1 spray  1 spray Mouth/Throat PRN Rodolph Bong, MD   1 spray at 05/13/22 2215   QUEtiapine (SEROQUEL) tablet 25 mg  25 mg Oral QHS PRN Rodolph Bong, MD       senna-docusate (Senokot-S) tablet 1 tablet  1 tablet Oral QHS PRN Darreld Mclean R, MD       sodium chloride flush (NS) 0.9 % injection 3 mL  3 mL Intravenous Q12H Charlsie Quest, MD   3 mL at 05/15/22 0936    REVIEW OF SYSTEMS:    All other systems were reviewed with the patient and are negative.  PHYSICAL EXAMINATION: ECOG PERFORMANCE STATUS: 1 - Symptomatic but completely ambulatory  Vitals:   05/15/22 1100 05/15/22 1545  BP: 101/66 110/79  Pulse: 74 79  Resp: 18 18  Temp: 97.7 F (36.5 C) 98.8 F (37.1 C)  SpO2: 95% 94%   Filed Weights   05/12/22 2003 05/14/22 0318 05/15/22 0348  Weight: 232 lb 12.9 oz (105.6 kg) 227 lb 4.7 oz (103.1 kg) 227 lb 15.3 oz (103.4 kg)    GENERAL:alert, no distress and comfortable SKIN: Noted significant facial bruising EYES: normal, conjunctiva are pink and non-injected, sclera clear OROPHARYNX:no exudate, no erythema and lips, buccal mucosa, and tongue normal  NECK: supple, thyroid normal size, non-tender, without nodularity LYMPH:  no palpable lymphadenopathy in the cervical, axillary or inguinal LUNGS: clear to auscultation and percussion with normal breathing effort HEART: regular rate & rhythm and no murmurs and no lower extremity edema ABDOMEN:abdomen soft, non-tender and normal bowel sounds Musculoskeletal:no cyanosis of digits and no clubbing  PSYCH: alert & oriented x 3 with fluent speech NEURO: no focal motor/sensory deficits  LABORATORY DATA:  I have reviewed  the data as listed Lab Results  Component Value Date   WBC 3.3 (L) 05/15/2022   HGB 14.5 05/15/2022   HCT 43.2 05/15/2022   MCV 87.3 05/15/2022   PLT 72 (L) 05/15/2022    I have reviewed his peripheral  blood smear.  Absolute thrombocytopenia is noted.  No signs of platelet clumping.  Unremarkable white blood cell morphology.  No schistocytes  RADIOGRAPHIC STUDIES: I personally reviewed his CT imaging of the chest, abdomen and pelvis I have personally reviewed the radiological images as listed and agreed with the findings in the report. MR BRAIN WO CONTRAST  Result Date: 05/13/2022 CLINICAL DATA:  Initial evaluation for mental status change, unknown cause. Recent falls. EXAM: MRI HEAD WITHOUT CONTRAST TECHNIQUE: Multiplanar, multiecho pulse sequences of the brain and surrounding structures were obtained without intravenous contrast. COMPARISON:  Prior CTs from 05/12/2022. FINDINGS: Brain: Examination degraded by motion artifact. Diffuse prominence of the CSF containing spaces compatible generalized cerebral atrophy. Patchy and confluent T2/FLAIR hyperdensity involving the periventricular deep white matter both cerebral hemispheres, consistent with chronic small vessel ischemic disease, moderate to advanced in nature. No abnormal foci of restricted diffusion to suggest acute or subacute ischemia. Gray-white matter differentiation maintained. No areas of chronic cortical infarction. No acute intracranial hemorrhage. Few punctate chronic micro hemorrhages noted about the right cerebellum and occipital regions, likely hypertensive and/or small vessel related. No mass lesion, midline shift or mass effect. Mild ventricular prominence related global parenchymal volume loss without hydrocephalus. No extra-axial fluid collection. Pituitary gland and suprasellar region within normal limits. Vascular: Major intracranial vascular flow voids are maintained. Skull and upper cervical spine: Craniocervical junction  within normal limits. Signal changes at the dens related to a prominent geode formation noted, also seen on prior CT. Underlying bone marrow signal intensity within normal limits. Evolving right frontal scalp contusion/hematoma. Sinuses/Orbits: Globes and orbital soft tissues demonstrate no acute finding. Mild scattered mucoperiosteal thickening present throughout the paranasal sinuses. Trace left mastoid effusion, of doubtful significance. Other: None. IMPRESSION: 1. No acute intracranial abnormality. 2. Evolving right frontal scalp contusion/hematoma. 3. Age-related cerebral atrophy with moderate to advanced chronic microvascular ischemic disease. Electronically Signed   By: Rise Mu M.D.   On: 05/13/2022 03:55   CT HEAD WO CONTRAST  Result Date: 05/12/2022 CLINICAL DATA:  Head trauma, moderate-severe; Facial trauma, blunt; Neck trauma (Age >= 65y) EXAM: CT HEAD WITHOUT CONTRAST CT MAXILLOFACIAL WITHOUT CONTRAST CT CERVICAL SPINE WITHOUT CONTRAST TECHNIQUE: Multidetector CT imaging of the head, cervical spine, and maxillofacial structures were performed using the standard protocol without intravenous contrast. Multiplanar CT image reconstructions of the cervical spine and maxillofacial structures were also generated. RADIATION DOSE REDUCTION: This exam was performed according to the departmental dose-optimization program which includes automated exposure control, adjustment of the mA and/or kV according to patient size and/or use of iterative reconstruction technique. COMPARISON:  CT head 09/25/2021 FINDINGS: CT HEAD FINDINGS Brain: Cerebral ventricle sizes are concordant with the degree of cerebral volume loss. Patchy and confluent areas of decreased attenuation are noted throughout the deep and periventricular white matter of the cerebral hemispheres bilaterally, compatible with chronic microvascular ischemic disease. No evidence of large-territorial acute infarction. No parenchymal hemorrhage.  No mass lesion. No extra-axial collection. No mass effect or midline shift. No hydrocephalus. Basilar cisterns are patent. Vascular: No hyperdense vessel. Atherosclerotic calcifications are present within the cavernous internal carotid and vertebral arteries. Skull: No acute fracture or focal lesion. Other: Right frontal scalp hematoma formation. CT MAXILLOFACIAL FINDINGS Osseous: No fracture or mandibular dislocation. No destructive process. Sinuses/Orbits: Paranasal sinuses and mastoid air cells are clear. The orbits are unremarkable. Soft tissues: Negative. CT CERVICAL SPINE FINDINGS Alignment: Normal. Skull base and vertebrae: Severe multilevel degenerative changes spine most prominent at the C5-C6 level. Multilevel moderate  to severe osseous neural foraminal stenosis. No severe osseous central canal stenosis. No acute fracture. No aggressive appearing focal osseous lesion or focal pathologic process. Soft tissues and spinal canal: No prevertebral fluid or swelling. No visible canal hematoma. Upper chest: Interlobular septal wall thickening. Other: None. IMPRESSION: 1. No acute intracranial abnormality. 2.  No acute displaced facial fracture. 3. No acute displaced fracture or traumatic listhesis of the cervical spine. 4. Likely mild pulmonary edema. Electronically Signed   By: Tish Frederickson M.D.   On: 05/12/2022 21:38   CT Cervical Spine Wo Contrast  Result Date: 05/12/2022 CLINICAL DATA:  Head trauma, moderate-severe; Facial trauma, blunt; Neck trauma (Age >= 65y) EXAM: CT HEAD WITHOUT CONTRAST CT MAXILLOFACIAL WITHOUT CONTRAST CT CERVICAL SPINE WITHOUT CONTRAST TECHNIQUE: Multidetector CT imaging of the head, cervical spine, and maxillofacial structures were performed using the standard protocol without intravenous contrast. Multiplanar CT image reconstructions of the cervical spine and maxillofacial structures were also generated. RADIATION DOSE REDUCTION: This exam was performed according to the  departmental dose-optimization program which includes automated exposure control, adjustment of the mA and/or kV according to patient size and/or use of iterative reconstruction technique. COMPARISON:  CT head 09/25/2021 FINDINGS: CT HEAD FINDINGS Brain: Cerebral ventricle sizes are concordant with the degree of cerebral volume loss. Patchy and confluent areas of decreased attenuation are noted throughout the deep and periventricular white matter of the cerebral hemispheres bilaterally, compatible with chronic microvascular ischemic disease. No evidence of large-territorial acute infarction. No parenchymal hemorrhage. No mass lesion. No extra-axial collection. No mass effect or midline shift. No hydrocephalus. Basilar cisterns are patent. Vascular: No hyperdense vessel. Atherosclerotic calcifications are present within the cavernous internal carotid and vertebral arteries. Skull: No acute fracture or focal lesion. Other: Right frontal scalp hematoma formation. CT MAXILLOFACIAL FINDINGS Osseous: No fracture or mandibular dislocation. No destructive process. Sinuses/Orbits: Paranasal sinuses and mastoid air cells are clear. The orbits are unremarkable. Soft tissues: Negative. CT CERVICAL SPINE FINDINGS Alignment: Normal. Skull base and vertebrae: Severe multilevel degenerative changes spine most prominent at the C5-C6 level. Multilevel moderate to severe osseous neural foraminal stenosis. No severe osseous central canal stenosis. No acute fracture. No aggressive appearing focal osseous lesion or focal pathologic process. Soft tissues and spinal canal: No prevertebral fluid or swelling. No visible canal hematoma. Upper chest: Interlobular septal wall thickening. Other: None. IMPRESSION: 1. No acute intracranial abnormality. 2.  No acute displaced facial fracture. 3. No acute displaced fracture or traumatic listhesis of the cervical spine. 4. Likely mild pulmonary edema. Electronically Signed   By: Tish Frederickson M.D.    On: 05/12/2022 21:38   CT Maxillofacial Wo Contrast  Result Date: 05/12/2022 CLINICAL DATA:  Head trauma, moderate-severe; Facial trauma, blunt; Neck trauma (Age >= 65y) EXAM: CT HEAD WITHOUT CONTRAST CT MAXILLOFACIAL WITHOUT CONTRAST CT CERVICAL SPINE WITHOUT CONTRAST TECHNIQUE: Multidetector CT imaging of the head, cervical spine, and maxillofacial structures were performed using the standard protocol without intravenous contrast. Multiplanar CT image reconstructions of the cervical spine and maxillofacial structures were also generated. RADIATION DOSE REDUCTION: This exam was performed according to the departmental dose-optimization program which includes automated exposure control, adjustment of the mA and/or kV according to patient size and/or use of iterative reconstruction technique. COMPARISON:  CT head 09/25/2021 FINDINGS: CT HEAD FINDINGS Brain: Cerebral ventricle sizes are concordant with the degree of cerebral volume loss. Patchy and confluent areas of decreased attenuation are noted throughout the deep and periventricular white matter of the cerebral hemispheres bilaterally, compatible with  chronic microvascular ischemic disease. No evidence of large-territorial acute infarction. No parenchymal hemorrhage. No mass lesion. No extra-axial collection. No mass effect or midline shift. No hydrocephalus. Basilar cisterns are patent. Vascular: No hyperdense vessel. Atherosclerotic calcifications are present within the cavernous internal carotid and vertebral arteries. Skull: No acute fracture or focal lesion. Other: Right frontal scalp hematoma formation. CT MAXILLOFACIAL FINDINGS Osseous: No fracture or mandibular dislocation. No destructive process. Sinuses/Orbits: Paranasal sinuses and mastoid air cells are clear. The orbits are unremarkable. Soft tissues: Negative. CT CERVICAL SPINE FINDINGS Alignment: Normal. Skull base and vertebrae: Severe multilevel degenerative changes spine most prominent at the  C5-C6 level. Multilevel moderate to severe osseous neural foraminal stenosis. No severe osseous central canal stenosis. No acute fracture. No aggressive appearing focal osseous lesion or focal pathologic process. Soft tissues and spinal canal: No prevertebral fluid or swelling. No visible canal hematoma. Upper chest: Interlobular septal wall thickening. Other: None. IMPRESSION: 1. No acute intracranial abnormality. 2.  No acute displaced facial fracture. 3. No acute displaced fracture or traumatic listhesis of the cervical spine. 4. Likely mild pulmonary edema. Electronically Signed   By: Tish Frederickson M.D.   On: 05/12/2022 21:38   CT CHEST ABDOMEN PELVIS W CONTRAST  Result Date: 05/12/2022 CLINICAL DATA:  Polytrauma, blunt.  Fall. EXAM: CT CHEST, ABDOMEN, AND PELVIS WITH CONTRAST TECHNIQUE: Multidetector CT imaging of the chest, abdomen and pelvis was performed following the standard protocol during bolus administration of intravenous contrast. RADIATION DOSE REDUCTION: This exam was performed according to the departmental dose-optimization program which includes automated exposure control, adjustment of the mA and/or kV according to patient size and/or use of iterative reconstruction technique. CONTRAST:  75mL OMNIPAQUE IOHEXOL 350 MG/ML SOLN COMPARISON:  01/18/2017. FINDINGS: CT CHEST FINDINGS Cardiovascular: Heart is enlarged and there is no pericardial effusion. Coronary artery calcifications are noted. 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 The pulmonary trunk is normal in caliber. Mediastinum/Nodes: No enlarged mediastinal, hilar, or axillary lymph nodes. Thyroid gland, trachea, and esophagus demonstrate no significant findings. Lungs/Pleura: Atelectasis is present bilaterally. No effusion or pneumothorax. There is a subpleural nodule in the right middle lobe measuring 6 mm, axial image 88, unchanged from 2018 and likely  benign. Musculoskeletal: Degenerative changes are present in the thoracic spine. No acute fracture. CT ABDOMEN PELVIS FINDINGS Hepatobiliary: No focal liver abnormality is seen. No gallstones, gallbladder wall thickening, or biliary dilatation. Pancreas: Unremarkable. No pancreatic ductal dilatation or surrounding inflammatory changes. Spleen: No splenic injury or perisplenic hematoma. Adrenals/Urinary Tract: The adrenal glands are within normal limits. Multiple cysts are present within the kidneys. No renal calculus or hydronephrosis. Bladder is unremarkable. Stomach/Bowel: Stomach is within normal limits. Appendix appears normal. No evidence of bowel wall thickening, distention, or inflammatory changes. No free air or pneumatosis. Vascular/Lymphatic: Aortic atherosclerosis. No enlarged abdominal or pelvic lymph nodes. Reproductive: Prostate is normal in size. Brachytherapy seeds are noted. Other: No abdominopelvic ascites. Musculoskeletal: Degenerative changes in the lumbar spine. No acute osseous abnormality. IMPRESSION: 1. No evidence of acute fracture or solid organ injury. 2. 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 Recommend annual imaging followup by CTA or MRA. This recommendation follows 2010 ACCF/AHA/AATS/ACR/ASA/SCA/SCAI/SIR/STS/SVM Guidelines for the Diagnosis and Management of Patients with Thoracic Aortic Disease. Circulation. 2010; 121: Z610-R604. Aortic aneurysm NOS (ICD10-I71.9) 3. Coronary artery calcifications. Electronically Signed   By: Thornell Sartorius M.D.   On: 05/12/2022 21:21   DG Pelvis  Portable  Result Date: 05/12/2022 CLINICAL DATA:  Trauma, fall. EXAM: PORTABLE PELVIS 1-2 VIEWS COMPARISON:  None Available. FINDINGS: The cortical margins of the bony pelvis are intact. No fracture. Pubic symphysis and sacroiliac joints are congruent. Both femoral heads are well-seated in the respective acetabula. Mild bilateral hip degenerative change.  IMPRESSION: No pelvic fracture. Electronically Signed   By: Narda Rutherford M.D.   On: 05/12/2022 19:02   DG Chest Port 1 View  Result Date: 05/12/2022 CLINICAL DATA:  Trauma, fall. EXAM: PORTABLE CHEST 1 VIEW COMPARISON:  Radiograph 03/06/2013 FINDINGS: The cardiomediastinal contours are normal. Aortic arch atherosclerosis. Pulmonary vasculature is normal. No consolidation, pleural effusion, or pneumothorax. No acute osseous abnormalities are seen. IMPRESSION: No acute findings or evidence of traumatic injury. Electronically Signed   By: Narda Rutherford M.D.   On: 05/12/2022 19:01

## 2022-05-15 NOTE — Evaluation (Signed)
Speech Language Pathology Evaluation Patient Details Name: James Dyer MRN: [id number] DOB: Nov 21, 1945 Today's Date: 05/15/2022 Time: 6962-9528 SLP Time Calculation (min) (ACUTE ONLY): 23 min  Problem List:  Patient Active Problem List   Diagnosis Date Noted   Hypomagnesemia 05/14/2022   AKI (acute kidney injury) 05/13/2022   Acute metabolic encephalopathy 05/13/2022   Hypoglycemia 05/13/2022   Fall 05/13/2022   Acute kidney injury 05/12/2022   Paroxysmal atrial fibrillation 05/12/2022   Elevated CK 05/12/2022   Fall at home, initial encounter 05/12/2022   Altered mental status 05/12/2022   Thrombocytopenia 05/12/2022   Fever 05/12/2022   Persistent atrial fibrillation    HNP (herniated nucleus pulposus), lumbar 09/01/2013   HTN (hypertension) 03/19/2013   Hyperlipemia 03/19/2013   Type 2 diabetes mellitus with hypoglycemia 03/19/2013   Chest pain 03/19/2013   Pain in joint, shoulder region 10/23/2012   Muscle weakness (generalized) 10/23/2012   S/P rotator cuff repair 10/23/2012   Complete tear of rotator cuff 09/09/2012   History of radiation therapy    Prostate cancer 12/18/2010   Past Medical History:  Past Medical History:  Diagnosis Date   Arthritis    HNP- lumbar   Borderline diabetes    Cancer 2013   prostate , treated /w radiation    Diabetes mellitus without complication    Foot drop, bilateral since birthpt wears braces in shoes   pt must have braces to walk   Hepatitis    "pt told had been exposed to hepatitis when he went to donate blood"   History of kidney stones    History of radiation therapy 04/12/11 - 06/06/11   prostate, seminal vesicles   History of stress test    Echocardiogram done - 03/2013   Hypercholesterolemia    states Dr. Renette Butters Rx Welchol- pt. hs stopped taking because he believe it gives him  leg cramps.   Hypertension    Nephrolithiasis    renal calculi- , also has been told that he has a cyst on kidney - R side     Neuromuscular disorder    bilateral foot drop- both feet    Paroxysmal atrial fibrillation    Past Surgical History:  Past Surgical History:  Procedure Laterality Date   BACK SURGERY     CARDIOVERSION N/A 02/28/2021   Procedure: CARDIOVERSION;  Surgeon: Thurmon Fair, MD;  Location: [facility];  Service: Cardiovascular;  Laterality: N/A;   COLONOSCOPY N/A 07/22/2012   Procedure: COLONOSCOPY;  Surgeon: Dalia Heading, MD;  Location: [facility];  Service: Gastroenterology;  Laterality: N/A;   ESOPHAGEAL DILATION N/A 04/22/2019   Procedure: ESOPHAGEAL DILATION;  Surgeon: Malissa Hippo, MD;  Location: [facility];  Service: Endoscopy;  Laterality: N/A;   ESOPHAGOGASTRODUODENOSCOPY N/A 04/22/2019   Procedure: ESOPHAGOGASTRODUODENOSCOPY (EGD);  Surgeon: Malissa Hippo, MD;  Location: [facility];  Service: Endoscopy;  Laterality: N/A;  145   EXTRACORPOREAL SHOCK WAVE LITHOTRIPSY Left 03/04/2017   Procedure: LEFT EXTRACORPOREAL SHOCK WAVE LITHOTRIPSY (ESWL);  Surgeon: Rene Paci, MD;  Location: [facility];  Service: Urology;  Laterality: Left;   FOOT SURGERY Right 1980's   calcium deposit removed   LUMBAR LAMINECTOMY/DECOMPRESSION MICRODISCECTOMY Right 09/01/2013   Procedure: RIGHT LUMBAR FOUR-FIVE LAMINECTOMY/DECOMPRESSION MICRODISCECTOMY 1 LEVEL;  Surgeon: Coletta Memos, MD;  Location: [facility];  Service: Neurosurgery;  Laterality: Right;  Right L45 microdiskectomy   prostate biopsy  2013   SHOULDER OPEN ROTATOR CUFF REPAIR Left 09/09/2012   Procedure: LEFT ROTATOR CUFF REPAIR SHOULDER  OPEN;  Surgeon: Jacki Cones, MD;  Location: [facility];  Service: Orthopedics;  Laterality: Left;   HPI:  Pt is a 77 y.o. male who presented 05/12/22 s/p fall with AMS. Imaging showed no acute intracranial abnormality, no acute displaced facial fracture, no acute displaced fracture or traumatic listhesis of the C-spine. CT chest/abdomen/pelvis with contrast negative for acute fracture or  solid organ injury. Admitted with AKI and hypoglycemia. PMH: PAF on Eliquis, T2DM, HTN, HLD, chronic bilateral foot drop, history of prostate cancer   Assessment / Plan / Recommendation Clinical Impression  Pt seen for speech-language-cognitive assessment. He states he was concerned a bit with his memory before this admission. His brother was present and reports his thinking and affect has declined from baseline. He demonstrated decreased processing, problem solving and decreased prospective memory scoring a 14/30 on the SLUMS. He does show some intellectual awareness of memory deficits and difficulty with mobility. Pt would benefit from continued ST in acute care and from rehab program > 3 hours a day.    SLP Assessment  SLP Recommendation/Assessment: Patient needs continued Speech Lanaguage Pathology Services SLP Visit Diagnosis: Cognitive communication deficit (R41.841)    Recommendations for follow up therapy are one component of a multi-disciplinary discharge planning process, led by the attending physician.  Recommendations may be updated based on patient status, additional functional criteria and insurance authorization.    Follow Up Recommendations  Acute inpatient rehab (3hours/day)    Assistance Recommended at Discharge  Frequent or constant Supervision/Assistance  Functional Status Assessment Patient has had a recent decline in their functional status and demonstrates the ability to make significant improvements in function in a reasonable and predictable amount of time.  Frequency and Duration min 2x/week  2 weeks      SLP Evaluation Cognition  Overall Cognitive Status: Impaired/Different from baseline Arousal/Alertness:  (slightly drowsy) Orientation Level: Oriented to person (oriented to day of week) Year: 2023 Day of Week: Correct Attention: Sustained Sustained Attention: Impaired Sustained Attention Impairment: Verbal basic (sleepy) Memory: Impaired (0/5  recall) Memory Impairment: Retrieval deficit (recalled with semantic cue) Awareness: Impaired Awareness Impairment: Anticipatory impairment (able to state he can't move around as well) Problem Solving: Impaired Problem Solving Impairment: Verbal basic Safety/Judgment: Impaired       Comprehension  Auditory Comprehension Overall Auditory Comprehension: Appears within functional limits for tasks assessed Visual Recognition/Discrimination Discrimination: Not tested Reading Comprehension Reading Status: Not tested    Expression Expression Primary Mode of Expression: Verbal Verbal Expression Overall Verbal Expression: Appears within functional limits for tasks assessed Level of Generative/Spontaneous Verbalization: Sentence Repetition:  (NT) Naming: Impairment Divergent:  (5 animals in one minute) Pragmatics: No impairment Written Expression Dominant Hand: Right Written Expression: Not tested   Oral / Motor  Oral Motor/Sensory Function Overall Oral Motor/Sensory Function: Within functional limits Motor Speech Overall Motor Speech: Appears within functional limits for tasks assessed Respiration: Within functional limits Phonation: Normal Resonance: Within functional limits Articulation: Within functional limitis Intelligibility: Intelligible Motor Planning: Witnin functional limits            Royce Macadamia 05/15/2022, 2:32 PM

## 2022-05-15 NOTE — Progress Notes (Signed)
[facility] NP notified [place] bp85/63 manual 80/40 HR 62 urine out put 200 mls bladder scan 396 mls Bun 24 creatine 1.56 NA 133

## 2022-05-15 NOTE — Progress Notes (Signed)
PT Cancellation Note  Patient Details Name: James Dyer MRN: [id number] DOB: May 25, 1945   Cancelled Treatment:    Reason Eval/Treat Not Completed: (P) Other (comment) (pt working with SLP. Did not have time to reattempt)   Angus Palms 05/15/2022, 6:49 PM* delayed entry

## 2022-05-15 NOTE — Progress Notes (Signed)
Inpatient Rehab Admissions Coordinator:   Consult received and chart reviewed.  Pt does not have the medical necessity to support an IR admission at this time.  Recommend TOC pursue other rehab options.   Estill Dooms, PT, DPT Admissions Coordinator [phone] 05/15/22  12:20 PM

## 2022-05-16 DIAGNOSIS — S0990XA Unspecified injury of head, initial encounter: Secondary | ICD-10-CM

## 2022-05-16 DIAGNOSIS — W19XXXA Unspecified fall, initial encounter: Secondary | ICD-10-CM | POA: Diagnosis not present

## 2022-05-16 DIAGNOSIS — N179 Acute kidney failure, unspecified: Secondary | ICD-10-CM | POA: Diagnosis not present

## 2022-05-16 DIAGNOSIS — R413 Other amnesia: Secondary | ICD-10-CM

## 2022-05-16 DIAGNOSIS — R509 Fever, unspecified: Secondary | ICD-10-CM | POA: Diagnosis not present

## 2022-05-16 DIAGNOSIS — E162 Hypoglycemia, unspecified: Secondary | ICD-10-CM | POA: Diagnosis not present

## 2022-05-16 LAB — GLUCOSE, CAPILLARY
Glucose-Capillary: 104 mg/dL — ABNORMAL HIGH (ref 70–99)
Glucose-Capillary: 115 mg/dL — ABNORMAL HIGH (ref 70–99)
Glucose-Capillary: 128 mg/dL — ABNORMAL HIGH (ref 70–99)
Glucose-Capillary: 134 mg/dL — ABNORMAL HIGH (ref 70–99)
Glucose-Capillary: 164 mg/dL — ABNORMAL HIGH (ref 70–99)
Glucose-Capillary: 96 mg/dL (ref 70–99)

## 2022-05-16 LAB — BASIC METABOLIC PANEL
Anion gap: 4 — ABNORMAL LOW (ref 5–15)
BUN: 15 mg/dL (ref 8–23)
CO2: 24 mmol/L (ref 22–32)
Calcium: 7.5 mg/dL — ABNORMAL LOW (ref 8.9–10.3)
Chloride: 109 mmol/L (ref 98–111)
Creatinine, Ser: 1.08 mg/dL (ref 0.61–1.24)
GFR, Estimated: >60 mL/min (ref >60)
Glucose, Bld: 113 mg/dL — ABNORMAL HIGH (ref 70–99)
Potassium: 3.4 mmol/L — ABNORMAL LOW (ref 3.5–5.1)
Sodium: 137 mmol/L (ref 135–145)

## 2022-05-16 LAB — CBC WITH DIFFERENTIAL/PLATELET
Abs Immature Granulocytes: 0 10*3/uL (ref 0.00–0.07)
Basophils Absolute: 0 10*3/uL (ref 0.0–0.1)
Basophils Relative: 0 %
Eosinophils Absolute: 0 10*3/uL (ref 0.0–0.5)
Eosinophils Relative: 0 %
HCT: 39.8 % (ref 39.0–52.0)
Hemoglobin: 13.4 g/dL (ref 13.0–17.0)
Lymphocytes Relative: 16 %
Lymphs Abs: 0.7 10*3/uL (ref 0.7–4.0)
MCH: 29.1 pg (ref 26.0–34.0)
MCHC: 33.7 g/dL (ref 30.0–36.0)
MCV: 86.5 fL (ref 80.0–100.0)
Monocytes Absolute: 0.2 10*3/uL (ref 0.1–1.0)
Monocytes Relative: 4 %
Neutro Abs: 3.4 10*3/uL (ref 1.7–7.7)
Neutrophils Relative %: 80 %
Platelets: 73 10*3/uL — ABNORMAL LOW (ref 150–400)
RBC: 4.6 MIL/uL (ref 4.22–5.81)
RDW: 13.4 % (ref 11.5–15.5)
WBC: 4.3 10*3/uL (ref 4.0–10.5)
nRBC: 0.7 % — ABNORMAL HIGH (ref 0.0–0.2)
nRBC: 1 /100 WBC — ABNORMAL HIGH

## 2022-05-16 LAB — MAGNESIUM: Magnesium: 2 mg/dL (ref 1.7–2.4)

## 2022-05-16 LAB — HAPTOGLOBIN: Haptoglobin: 66 mg/dL (ref 34–355)

## 2022-05-16 LAB — CORTISOL-AM, BLOOD: Cortisol - AM: 6.8 ug/dL (ref 6.7–22.6)

## 2022-05-16 MED ORDER — CHLORHEXIDINE GLUCONATE CLOTH 2 % EX PADS
6.0000 | MEDICATED_PAD | Freq: Every day | CUTANEOUS | Status: DC
  Administered 2022-05-16 – 2022-05-22 (×7): 6 via TOPICAL

## 2022-05-16 MED ORDER — MAGNESIUM OXIDE -MG SUPPLEMENT 400 (240 MG) MG PO TABS
400.0000 mg | ORAL_TABLET | Freq: Two times a day (BID) | ORAL | Status: AC
  Administered 2022-05-16 (×2): 400 mg via ORAL
  Filled 2022-05-16 (×2): qty 1

## 2022-05-16 MED ORDER — COSYNTROPIN 0.25 MG IJ SOLR
0.2500 mg | Freq: Once | INTRAMUSCULAR | Status: AC
  Administered 2022-05-17: 0.25 mg via INTRAVENOUS
  Filled 2022-05-16: qty 0.25

## 2022-05-16 MED ORDER — TAMSULOSIN HCL 0.4 MG PO CAPS
0.4000 mg | ORAL_CAPSULE | Freq: Every day | ORAL | Status: DC
  Administered 2022-05-16 – 2022-05-22 (×7): 0.4 mg via ORAL
  Filled 2022-05-16 (×7): qty 1

## 2022-05-16 MED ORDER — POTASSIUM CHLORIDE CRYS ER 20 MEQ PO TBCR
40.0000 meq | EXTENDED_RELEASE_TABLET | Freq: Two times a day (BID) | ORAL | Status: AC
  Administered 2022-05-16 (×2): 40 meq via ORAL
  Filled 2022-05-16 (×2): qty 2

## 2022-05-16 NOTE — Progress Notes (Signed)
Occupational Therapy Treatment Patient Details Name: James Dyer MRN: [id number] DOB: [birth year] Today's Date: 05/16/2022   History of present illness Pt is a 77 y.o. male who presented 05/12/22 s/p fall with AMS. Imaging showed no acute intracranial abnormality, no acute displaced facial fracture, no acute displaced fracture or traumatic listhesis of the C-spine. CT chest/abdomen/pelvis with contrast negative for acute fracture or solid organ injury. Admitted with AKI and hypoglycemia. PMH: PAF on Eliquis, T2DM, HTN, HLD, chronic bilateral foot drop, history of prostate cancer   OT comments  Pt seen for OT ADL retraining session to include bed mobility (+1 Min gaurd with increased time as pt had difficulty following 1 step commands consistently); Sitting up at EOB with +1 supervision-Min guard assist; donning clean gown with Min A; Donning bilateral AFO's with Max A (likely due to gripper socks in AFO/Son to bring regular socks) and simulated step pivot toilet transfer (EOB to recliner) with +2 Min A for safety and sequencing. Pt is limited by cognition, following 1 step commands inconsistently and multiple falls in last month per son report. He should benefit from continued acute OT to assist in maximizing independence with ADL's and functional transfers.   Recommendations for follow up therapy are one component of a multi-disciplinary discharge planning process, led by the attending physician.  Recommendations may be updated based on patient status, additional functional criteria and insurance authorization.    Assistance Recommended at Discharge Frequent or constant Supervision/Assistance  Patient can return home with the following  Two people to help with walking and/or transfers;A lot of help with bathing/dressing/bathroom;Assistance with cooking/housework;Assist for transportation;Direct supervision/assist for financial management;Direct supervision/assist for medications management;Help  with stairs or ramp for entrance   Equipment Recommendations  Other (comment) (defer ot next venue)    Recommendations for Other Services      Precautions / Restrictions Precautions Precautions: Fall;Other (comment) Precaution Comments: watch vitals; bil foot drop with braces at baseline Required Braces or Orthoses: Other Brace Other Brace: BLE AFOs for OOB mobility Restrictions Weight Bearing Restrictions: No       Mobility Bed Mobility Overal bed mobility: Needs Assistance Bed Mobility: Supine to Sit     Supine to sit: Min guard, HOB elevated     General bed mobility comments: Min guard A to shift hips forward + use of bedrails as well as verbal and tactile cues, increaesd time overall Patient Response: Flat affect  Transfers Overall transfer level: Needs assistance Equipment used: Rolling walker (2 wheels) Transfers: Sit to/from Stand, Bed to chair/wheelchair/BSC Sit to Stand: Min assist, +2 physical assistance, +2 safety/equipment, From elevated surface     Step pivot transfers: Min guard, Min assist, +2 physical assistance, +2 safety/equipment     General transfer comment: Pt with difficulty following 1 step commands during functional transfers due to cognition. Increased time and consistent verbal as well as tactile cues required     Balance Overall balance assessment: Needs assistance Sitting-balance support: Single extremity supported, Bilateral upper extremity supported, Feet supported Sitting balance-Leahy Scale: Fair     Standing balance support: Bilateral upper extremity supported, Reliant on assistive device for balance Standing balance-Leahy Scale: Fair Standing balance comment: Reliant on RW     ADL either performed or assessed with clinical judgement   ADL Overall ADL's : Needs assistance/impaired   Upper Body Dressing : Sitting;Cueing for sequencing;Minimal assistance Upper Body Dressing Details (indicate cue type and reason): Donning clean  gown Lower Body Dressing: Maximal assistance;Sitting/lateral leans Lower Body Dressing Details (indicate  cue type and reason): Max A to don/doff AFOs, which patient would do independently before (pt son to bring in regular socks as gripper socks were difficult in AFO's) Toilet Transfer: Minimal assistance;Rolling walker (2 wheels);+2 for physical assistance;+2 for safety/equipment;Squat-pivot Toilet Transfer Details (indicate cue type and reason): Simulated transfer from EOB to Chair. Pt required consistent verbal and tactile cues for taking steps with RW, Pt unable to follow 1 step commands consistently Toileting- Clothing Manipulation and Hygiene: Maximal assistance;Sit to/from stand   Functional mobility during ADLs: Minimal assistance;+2 for physical assistance;+2 for safety/equipment;Cueing for safety;Cueing for sequencing;Rolling walker (2 wheels) General ADL Comments: Pt seen for OT ADL retraining session to include bed mobility (+1 Min gaurd with increased time as pt had difficulty following 1 step commands consistently); Sitting up at EOB with +1 supervision-Min guard assist; donning clean gown with Min A; Donning bilateral AFO's with Max A (likely due to gripper socks in AFO/Son to bring regular socks) and simulated step pivot toilet transfer (EOB to recliner) with +2 Min A for safety and sequencing. Pt is limited by cognition, following 1 step commands inconsistently and multiple falls in last month per son report. He should benefit from continued acute OT to assist in maximizing independence with ADL's and functional transfers.    Extremity/Trunk Assessment Upper Extremity Assessment Upper Extremity Assessment: Generalized weakness   Lower Extremity Assessment Lower Extremity Assessment: Defer to PT evaluation   Cervical / Trunk Assessment Cervical / Trunk Assessment: Normal     Cognition Arousal/Alertness: Awake/alert Behavior During Therapy: Flat affect Overall Cognitive Status:  Impaired/Different from baseline Area of Impairment: Attention, Following commands, Memory, Safety/judgement, Problem solving, Awareness   Current Attention Level: Sustained Memory: Decreased short-term memory Following Commands: Follows one step commands inconsistently, Follows one step commands with increased time Safety/Judgement: Decreased awareness of safety, Decreased awareness of deficits Awareness: Emergent Problem Solving: Slow processing, Decreased initiation, Difficulty sequencing, Requires verbal cues, Requires tactile cues General Comments: Pt requires frequent verbal cueing to redirect attention to task and to initate tasks when prompted.                   Pertinent Vitals/ Pain       Pain Assessment Pain Assessment: No/denies pain Faces Pain Scale: No hurt  Home Living Family/patient expects to be discharged to:: Private residence Living Arrangements: Spouse/significant other Available Help at Discharge: Family;Available PRN/intermittently Type of Home: House Home Access: Stairs to enter [employer] of Steps: 1-2 (1 in back with no rail, 2 in front with rail) Entrance Stairs-Rails: Right;None Home Layout: One level     Bathroom Shower/Tub: Producer, television/film/video: Standard Bathroom Accessibility: Yes   Home Equipment: Agricultural consultant (2 wheels);Cane - single point   Additional Comments: has x3 dogs  Lives With: Spouse    Prior Functioning/Environment   Refer to initial Eval for details   Frequency  Min 3X/week        Progress Toward Goals  OT Goals(current goals can now be found in the care plan section)  Progress towards OT goals: Progressing toward goals  Acute Rehab OT Goals Patient Stated Goal: None stated OT Goal Formulation: With patient/family Time For Goal Achievement: 05/28/22 Potential to Achieve Goals: Good  Plan Discharge plan remains appropriate    Co-evaluation    PT/OT/SLP Co-Evaluation/Treatment:  Yes   PT goals addressed during session: Mobility/safety with mobility;Balance;Proper use of DME OT goals addressed during session: ADL's and self-care;Proper use of Adaptive equipment and DME  AM-PAC OT "6 Clicks" Daily Activity     Outcome Measure   Help from another person eating meals?: A Little Help from another person taking care of personal grooming?: A Little Help from another person toileting, which includes using toliet, bedpan, or urinal?: A Lot Help from another person bathing (including washing, rinsing, drying)?: A Lot Help from another person to put on and taking off regular upper body clothing?: A Little Help from another person to put on and taking off regular lower body clothing?: A Lot 6 Click Score: 15    End of Session Equipment Utilized During Treatment: Gait belt;Rolling walker (2 wheels)  OT Visit Diagnosis: Unsteadiness on feet (R26.81);Other abnormalities of gait and mobility (R26.89);History of falling (Z91.81);Muscle weakness (generalized) (M62.81)   Activity Tolerance Patient tolerated treatment well   Patient Left in chair;with call bell/phone within reach;with chair alarm set   Nurse Communication Mobility status        Time: 1308-6578 OT Time Calculation (min): 28 min  Charges: OT General Charges $OT Visit: 1 Visit OT Treatments $Self Care/Home Management : 8-22 mins   Chevie Birkhead Beth Dixon, OTR/L 05/16/2022, 9:58 AM

## 2022-05-16 NOTE — Progress Notes (Signed)
Physical Therapy Treatment Patient Details Name: James Dyer MRN: [id number] DOB: 1945/11/11 Today's Date: 05/16/2022   History of Present Illness Pt is a 77 y.o. male who presented 05/12/22 s/p fall with AMS. Imaging showed no acute intracranial abnormality, no acute displaced facial fracture, no acute displaced fracture or traumatic listhesis of the C-spine. CT chest/abdomen/pelvis with contrast negative for acute fracture or solid organ injury. Admitted with AKI and hypoglycemia. PMH: PAF on Eliquis, T2DM, HTN, HLD, chronic bilateral foot drop, history of prostate cancer    PT Comments    Pt tolerated treatment well today. Co treat with OT. Pt was able to sit EOB and transfer to chair with +2 Min A. Pt continues to be limited by cognition following 1 step commands inconsistently. DC recs updated to SNF as pt was denied by CIR. PT will continue to follow.   Recommendations for follow up therapy are one component of a multi-disciplinary discharge planning process, led by the attending physician.  Recommendations may be updated based on patient status, additional functional criteria and insurance authorization.  Follow Up Recommendations  Can patient physically be transported by private vehicle: No    Assistance Recommended at Discharge Frequent or constant Supervision/Assistance  Patient can return home with the following Two people to help with walking and/or transfers;A lot of help with bathing/dressing/bathroom;Assistance with cooking/housework;Direct supervision/assist for financial management;Direct supervision/assist for medications management;Assist for transportation;Help with stairs or ramp for entrance   Equipment Recommendations  Other (comment) (Per accepting facility)    Recommendations for Other Services       Precautions / Restrictions Precautions Precautions: Fall;Other (comment) Precaution Comments: watch vitals; bil foot drop with braces at baseline Required  Braces or Orthoses: Other Brace Other Brace: BLE AFOs for OOB mobility     Mobility  Bed Mobility Overal bed mobility: Needs Assistance Bed Mobility: Supine to Sit     Supine to sit: Min guard, HOB elevated     General bed mobility comments: Min guard A to shift hips forward + use of bedrails as well as verbal and tactile cues, increaesd time overall    Transfers Overall transfer level: Needs assistance Equipment used: Rolling walker (2 wheels) Transfers: Sit to/from Stand, Bed to chair/wheelchair/BSC Sit to Stand: Min assist, +2 physical assistance, +2 safety/equipment, From elevated surface   Step pivot transfers: Min guard, Min assist, +2 physical assistance, +2 safety/equipment       General transfer comment: Pt with difficulty following 1 step commands during functional transfers due to cognition. Increased time and consistent verbal as well as tactile cues required    Ambulation/Gait                   Stairs             Wheelchair Mobility    Modified Rankin (Stroke Patients Only)       Balance Overall balance assessment: Needs assistance Sitting-balance support: Single extremity supported, Bilateral upper extremity supported, Feet supported Sitting balance-Leahy Scale: Fair     Standing balance support: Bilateral upper extremity supported, Reliant on assistive device for balance Standing balance-Leahy Scale: Fair Standing balance comment: Reliant on RW                            Cognition Arousal/Alertness: Awake/alert Behavior During Therapy: Flat affect Overall Cognitive Status: Impaired/Different from baseline Area of Impairment: Attention, Following commands, Memory, Safety/judgement, Problem solving, Awareness  Current Attention Level: Sustained Memory: Decreased short-term memory Following Commands: Follows one step commands inconsistently, Follows one step commands with increased  time Safety/Judgement: Decreased awareness of safety, Decreased awareness of deficits Awareness: Emergent Problem Solving: Slow processing, Decreased initiation, Difficulty sequencing, Requires verbal cues, Requires tactile cues General Comments: Pt requires frequent verbal cueing to redirect attention to task and to initate tasks when prompted.        Exercises      General Comments General comments (skin integrity, edema, etc.): VSS on RA.      Pertinent Vitals/Pain Pain Assessment Pain Assessment: No/denies pain Faces Pain Scale: No hurt    Home Living Family/patient expects to be discharged to:: Private residence Living Arrangements: Spouse/significant other Available Help at Discharge: Family;Available PRN/intermittently Type of Home: House Home Access: Stairs to enter Entrance Stairs-Rails: Right;None Entrance Stairs-Number of Steps: 1-2 (1 in back with no rail, 2 in front with rail)   Home Layout: One level Home Equipment: Agricultural consultant (2 wheels);Cane - single point Additional Comments: has x3 dogs    Prior Function            PT Goals (current goals can now be found in the care plan section) Progress towards PT goals: Progressing toward goals    Frequency    Min 1X/week      PT Plan Discharge plan needs to be updated    Co-evaluation     PT goals addressed during session: Mobility/safety with mobility;Balance;Proper use of DME OT goals addressed during session: ADL's and self-care;Proper use of Adaptive equipment and DME      AM-PAC PT "6 Clicks" Mobility   Outcome Measure  Help needed turning from your back to your side while in a flat bed without using bedrails?: A Lot Help needed moving from lying on your back to sitting on the side of a flat bed without using bedrails?: A Lot Help needed moving to and from a bed to a chair (including a wheelchair)?: A Lot Help needed standing up from a chair using your arms (e.g., wheelchair or bedside  chair)?: A Lot Help needed to walk in hospital room?: A Lot Help needed climbing 3-5 steps with a railing? : Total 6 Click Score: 11    End of Session Equipment Utilized During Treatment: Gait belt Activity Tolerance: Patient tolerated treatment well Patient left: in chair;with call bell/phone within reach;with chair alarm set Nurse Communication: Mobility status PT Visit Diagnosis: Unsteadiness on feet (R26.81);Muscle weakness (generalized) (M62.81);History of falling (Z91.81);Repeated falls (R29.6);Difficulty in walking, not elsewhere classified (R26.2)     Time: 7829-5621 PT Time Calculation (min) (ACUTE ONLY): 28 min  Charges:  $Therapeutic Activity: 8-22 mins                     Shela Nevin, PT, DPT Acute Rehab Services [phone]    Gladys Damme 05/16/2022, 12:29 PM

## 2022-05-16 NOTE — TOC Initial Note (Signed)
Transition of Care [facility]) - Initial/Assessment Note    Patient Details  Name: James Dyer MRN: [id number] Date of Birth: Oct 02, 1945  Transition of Care [facility]) CM/SW Contact:    Leander Rams, LCSW Phone Number: 05/16/2022, 3:38 PM  Clinical Narrative:                 [name] met with pt and wife in the room. [name] discussed dc to STR at SNF. Wife stated that [facility] in [place] is their top choice and they're agreeable to dc to STR in [place], if [facility] isn't able to offer a bed.   [name] completed fl2 and faxed out. TOC will continue to follow this admission.   Expected Discharge Plan: Skilled Nursing Facility Barriers to Discharge: Continued Medical Work up   Patient Goals and CMS Choice            Expected Discharge Plan and Services       Living arrangements for the past 2 months: Single Family Home                                      Prior Living Arrangements/Services Living arrangements for the past 2 months: Single Family Home Lives with:: Spouse Patient language and need for interpreter reviewed:: Yes Do you feel safe going back to the place where you live?: Yes      Need for Family Participation in Patient Care: Yes (Comment) Care giver support system in place?: Yes (comment)   Criminal Activity/Legal Involvement Pertinent to Current Situation/Hospitalization: No - Comment as needed  Activities of Daily Living Home Assistive Devices/Equipment: Cane (specify quad or straight) ADL Screening (condition at time of admission) Patient's cognitive ability adequate to safely complete daily activities?: Yes Is the patient deaf or have difficulty hearing?: No Does the patient have difficulty seeing, even when wearing glasses/contacts?: No Does the patient have difficulty concentrating, remembering, or making decisions?: No Patient able to express need for assistance with ADLs?: Yes Does the patient have difficulty dressing or bathing?:  No Independently performs ADLs?: Yes (appropriate for developmental age) Does the patient have difficulty walking or climbing stairs?: Yes Weakness of Legs: None Weakness of Arms/Hands: None  Permission Sought/Granted Permission sought to share information with : Family Supports Permission granted to share information with : Yes, Verbal Permission Granted  Share Information with NAME: Dois Davenport     Permission granted to share info w Relationship: Wife  Permission granted to share info w Contact Information: [phone]  Emotional Assessment Appearance:: Appears older than stated age Attitude/Demeanor/Rapport: Gracious Affect (typically observed): Accepting Orientation: : Oriented to Self, Oriented to Place, Oriented to  Time, Oriented to Situation Alcohol / Substance Use: Not Applicable Psych Involvement: No (comment)  Admission diagnosis:  Hypoglycemia [E16.2] Acute kidney injury [N17.9] Injury of head, initial encounter [S09.90XA] Fall, initial encounter [W19.XXXA] Fever, unspecified fever cause [R50.9] AKI (acute kidney injury) [N17.9] Patient Active Problem List   Diagnosis Date Noted   Hypotension due to hypovolemia 05/15/2022   Acute urinary retention 05/15/2022   Hypomagnesemia 05/14/2022   AKI (acute kidney injury) 05/13/2022   Acute metabolic encephalopathy 05/13/2022   Hypoglycemia 05/13/2022   Fall 05/13/2022   Acute kidney injury 05/12/2022   Paroxysmal atrial fibrillation 05/12/2022   Elevated CK 05/12/2022   Fall at home, initial encounter 05/12/2022   Altered mental status 05/12/2022   Thrombocytopenia 05/12/2022   Fever 05/12/2022  Persistent atrial fibrillation    HNP (herniated nucleus pulposus), lumbar 09/01/2013   HTN (hypertension) 03/19/2013   Hyperlipemia 03/19/2013   Type 2 diabetes mellitus with hypoglycemia 03/19/2013   Chest pain 03/19/2013   Pain in joint, shoulder region 10/23/2012   Muscle weakness (generalized) 10/23/2012   S/P rotator  cuff repair 10/23/2012   Complete tear of rotator cuff 09/09/2012   History of radiation therapy    Prostate cancer 12/18/2010   PCP:  [name], MD Pharmacy:   [facility] [phone] - [place] - [address] AT [facility] [address] [place] [address] Phone: [phone] Fax: [phone]     Social Determinants of Health (SDOH) Social History: SDOH Screenings   Food Insecurity: No Food Insecurity (05/13/2022)  Housing: Low Risk  (05/13/2022)  Transportation Needs: No Transportation Needs (05/13/2022)  Utilities: Not At Risk (05/13/2022)  Tobacco Use: Medium Risk (05/12/2022)   SDOH Interventions:     Readmission Risk Interventions     No data to display         Oletta Lamas, MSW, LCSWA, LCASA Transitions of Care  Clinical Social Worker I

## 2022-05-16 NOTE — Consult Note (Signed)
   [facility] Inpatient Consult   05/16/2022  LIAN DEFREES 1945/11/16 [id number]     Location: [facility] RN Hospital Liaison attempted to meet with pt at bedside  however pt sleeping ([facility]).   [facility] Customer service manager [facility]) Accountable Care Organization [ACO] Patient: Insurance [facility])    Primary Care Provider:  [name], MD [facility]  Patient screened for   readmission hospitalization with noted low risk score for unplanned readmission risk with 1 IP in 6 months. [facility]/Population Health RN liaison will assess for potential [facility]) Care Management service needs for post hospital transition for care coordination.   Plan:[facility]/Population Health RN Liaison  will continue to follow ongoing disposition in assessing for post hospital community care coordination/management needs.  Referral request for community care coordination: pending disposition.   [facility] Care Management/Population Health does not replace or interfere with any arrangements made by the Inpatient Transition of Care team.   For questions contact:      Elliot Cousin, RN, BSN [facility] Liaison [place]   [facility]  Population Health Office Hours MTWF 8:00 am to 6 pm off on Thursday [phone] mobile [phone] [Office toll free line][facility] Office Hours are M-F 8:30 - 5 pm 24 hour nurse advise line [phone] Conceirge  [email or web address]

## 2022-05-16 NOTE — Progress Notes (Signed)
[facility] PROGRESS NOTE    Progress Note  BRANCH PACITTI  OZH:[id number] DOB: 1945-06-30 DOA: 05/12/2022 PCP: [name], MD     Brief Narrative:   James Dyer is an 77 y.o. male past medical history significant for paroxysmal atrial fibrillation on Eliquis, diabetes mellitus type 2, chronic bilateral foot drop, history of prostate cancer admitted for acute kidney injury and altered mental status after falling at home.   Assessment/Plan:   Acute kidney injury Likely prerenal azotemia creatinine on admission was 1.4 with a baseline approximate 1.1, CK 400. After hydration CK improved along with renal function. Developed hypotension overnight and acute urinary retention Foley was placed started on IV fluids and albumin and his creatinine has returned to baseline.  Continue IV hydration and oral hydration.  Possible adrenal insufficiency Cortisol level 6 will check cosyntropin test, has not been on steroids this past year. This would explain his hypotension, hypokalemia and hypoglycemia. He should be off antihypertensive medication and hypoglycemic agents.  BPH: Start on Flomax p.o. follow-up with urology as an outpatient. Will need a voiding trial.  Acute metabolic encephalopathy: Likely secondary to hypoglycemia CT of the head showed no acute findings, MRI of the head showed no acute abnormalities. After correction of hypoglycemia his drowsiness resolved.  Hypotension: Likely hypovolemic episode patient was pancultured with no signs and symptoms of infection. TSH was unremarkable no signs of bleeding. A.m. cortisol 6.8 check a cosyntropin test IV fluids 50 cc on next 24 hours. Procalcitonin was unremarkable  Fall at home/chronic bilateral foot drop: Underwent extensive imaging with no significant fracture or traumatic injuries. Unclear falls called but his CBG was 30 on presentation. PT OT recommended acute inpatient rehab.  Diabetes mellitus type 2 with  hypoglycemic episodes: Glycemia likely contributing to encephalopathy and falls. Patient received D50 in the ED placed on D5 with normal saline. A1c is 5.9. Might need to go home off oral hypoglycemic agents.  Fever: Of unclear source with a Tmax of 102.5 rectally in the ED. Infectious workup has been negative till date. Continue to monitor off antibiotics.  Paroxysmal atrial fibrillation: Rate control on Lopressor. Eliquis has been held due to new thrombocytopenia and periorbital ecchymosis. The case was discussed with hematology no contraindication to anticoagulation as long as platelets are greater than 50,000. Hemoglobin has remained stable. Can resume Eliquis as an outpatient.  Ascending aortic aneurysm: Will follow-up by PCP with a CTA as an outpatient. Will need of patient follow-up with vascular surgery.  Electrolyte imbalance hypokalemia/hypomagnesemia: Try to keep potassium greater than 4 magnesium greater than 2.  Memory issues: CT of brain showed no acute cranial abnormalities there is significant age-related atrophy probably due to chronic microvascular ischemic changes. Workup has been negative. Will need ambulatory referral as an outpatient.  Acute urinary retention Foley catheter inserted restart Flomax now that his blood pressure is improved. Will need a voiding trial in 2 to 3 days.    DVT prophylaxis: lovenox Family Communication:Daughter in-law, wife Status is: Inpatient Remains inpatient appropriate because: Acute kidney injury    Code Status:     Code Status Orders  (From admission, onward)           Start     Ordered   05/12/22 2334  Full code  Continuous       Question:  By:  Answer:  Consent: discussion documented in EHR   05/12/22 2335           Code Status History  Date Active Date Inactive Code Status Order ID Comments User Context   09/01/2013 2050 09/02/2013 1513 Full Code [id number]  Coletta Memos, MD Inpatient    09/09/2012 1347 09/10/2012 1216 Full Code [id number]  Jacki Cones, MD Inpatient         IV Access:   Peripheral IV   Procedures and diagnostic studies:   No results found.   Medical Consultants:   None.   Subjective:    [name] no complaints  Objective:    Vitals:   05/16/22 0004 05/16/22 0419 05/16/22 0741 05/16/22 1108  BP: 120/86 (!) 117/93 120/80 106/69  Pulse: 88 71 99 96  Resp: Temp: 98.8 F (37.1 C) 98.6 F (37 C) 99.2 F (37.3 C) 97.8 F (36.6 C)  TempSrc: Oral Oral Oral Oral  SpO2: 93% 96% 94% 95%  Weight:  101.4 kg    Height:       SpO2: 95 % O2 Flow Rate (L/min): 2 L/min   Intake/Output Summary (Last 24 hours) at 05/16/2022 1211 Last data filed at 05/16/2022 1100 Gross per 24 hour  Intake 3363.75 ml  Output 4275 ml  Net -911.25 ml   Filed Weights   05/14/22 0318 05/15/22 0348 05/16/22 0419  Weight: 103.1 kg 103.4 kg 101.4 kg    Exam: General exam: In no acute distress, traumatic face Respiratory system: Good air movement and clear to auscultation. Cardiovascular system: S1 & S2 heard, RRR. No JVD. Gastrointestinal system: Abdomen is nondistended, soft and nontender.  Extremities: No pedal edema. Skin: No rashes, lesions or ulcers Psychiatry: Judgement and insight appear normal. Mood & affect appropriate.    Data Reviewed:    Labs: Basic Metabolic Panel: Recent Labs  Lab 05/12/22 1820 05/12/22 1830 05/13/22 0030 05/14/22 0058 05/15/22 0057 05/16/22 0051  NA 135 136 134* 132* 133* 137  K 3.6 3.5 3.3* 3.8 3.7 3.4*  CL 101 100 102 99 100 109  CO2 22  --  21* GLUCOSE 71 67* 78 115* 188* 113*  BUN 32* 34* 28* 13 24* 15  CREATININE 1.49* 1.50* 1.24 1.00 1.56* 1.08  CALCIUM 8.3*  --  7.4* 7.9* 7.7* 7.5*  MG  --   --  1.7 1.6* 2.5* 2.0   GFR Estimated Creatinine Clearance: 68.3 mL/min (by C-G formula based on SCr of 1.08 mg/dL). Liver Function Tests: Recent Labs  Lab 05/12/22 1820  05/14/22 0814 05/15/22 0057  AST 62* 66* 57*  ALT 41 35 33  ALKPHOS 49 39 38  BILITOT 0.8 0.7 0.7  PROT 6.4* 5.9* 5.4*  ALBUMIN 3.6 2.9* 2.9*   No results for input(s): "LIPASE", "AMYLASE" in the last 168 hours. Recent Labs  Lab 05/15/22 1021  AMMONIA 36*   Coagulation profile Recent Labs  Lab 05/12/22 1820 05/13/22 0030  INR 1.5* 1.4*   COVID-19 Labs  Recent Labs    05/14/22 0814  LDH 413*    Lab Results  Component Value Date   SARSCOV2NAA NEGATIVE 05/12/2022   SARSCOV2NAA NEGATIVE 04/20/2019   SARSCOV2NAA Not Detected 12/19/2018   SARSCOV2NAA Not Detected 07/09/2018    CBC: Recent Labs  Lab 05/12/22 1820 05/12/22 1830 05/13/22 0030 05/14/22 0058 05/15/22 0057 05/16/22 0051  WBC 4.0  --  3.1* 4.0 3.3* 4.3  NEUTROABS  --   --   --  3.0 1.2* 3.4  HGB 14.3 14.3 12.6* 14.8 14.5 13.4  HCT 43.1 42.0 38.3* 44.0 43.2 39.8  MCV 88.5  --  89.5 87.1 87.3 86.5  PLT 106*  --  88* 69* 72* 73*   Cardiac Enzymes: Recent Labs  Lab 05/12/22 1820 05/13/22 0030  CKTOTAL 419* 381   BNP (last 3 results) No results for input(s): "PROBNP" in the last 8760 hours. CBG: Recent Labs  Lab 05/15/22 1958 05/16/22 0003 05/16/22 0418 05/16/22 0740 05/16/22 1107  GLUCAP 121* 115* 96 104* 134*   D-Dimer: No results for input(s): "DDIMER" in the last 72 hours. Hgb A1c: Recent Labs    05/14/22 0058  HGBA1C 5.9*   Lipid Profile: No results for input(s): "CHOL", "HDL", "LDLCALC", "TRIG", "CHOLHDL", "LDLDIRECT" in the last 72 hours. Thyroid function studies: Recent Labs    05/14/22 0212  TSH 2.324   Anemia work up: Recent Labs    05/14/22 1057  VITAMINB12 377  FOLATE 26.3   Sepsis Labs: Recent Labs  Lab 05/12/22 1820 05/13/22 0030 05/14/22 0058 05/15/22 0057 05/15/22 1021 05/16/22 0051  PROCALCITON  --   --   --   --  <0.10  --   WBC 4.0 3.1* 4.0 3.3*  --  4.3  LATICACIDVEN 0.9  --   --   --   --   --    Microbiology Recent Results (from the  past 240 hour(s))  Resp panel by RT-PCR (RSV, Flu A&B, Covid) Anterior Nasal Swab     Status: None   Collection Time: 05/12/22  9:00 PM   Specimen: Anterior Nasal Swab  Result Value Ref Range Status   SARS Coronavirus 2 by RT PCR NEGATIVE NEGATIVE Final   Influenza A by PCR NEGATIVE NEGATIVE Final   Influenza B by PCR NEGATIVE NEGATIVE Final    Comment: (NOTE) The Xpert Xpress SARS-CoV-2/FLU/RSV plus assay is intended as an aid in the diagnosis of influenza from Nasopharyngeal swab specimens and should not be used as a sole basis for treatment. Nasal washings and aspirates are unacceptable for Xpert Xpress SARS-CoV-2/FLU/RSV testing.  Fact Sheet for Patients: BloggerCourse.com  Fact Sheet for Healthcare Providers: SeriousBroker.it  This test is not yet approved or cleared by the [place] FDA and has been authorized for detection and/or diagnosis of SARS-CoV-2 by FDA under an Emergency Use Authorization (EUA). This EUA will remain in effect (meaning this test can be used) for the duration of the COVID-19 declaration under Section 564(b)(1) of the Act, 21 U.S.C. section 360bbb-3(b)(1), unless the authorization is terminated or revoked.     Resp Syncytial Virus by PCR NEGATIVE NEGATIVE Final    Comment: (NOTE) Fact Sheet for Patients: BloggerCourse.com  Fact Sheet for Healthcare Providers: SeriousBroker.it  This test is not yet approved or cleared by the [place] FDA and has been authorized for detection and/or diagnosis of SARS-CoV-2 by FDA under an Emergency Use Authorization (EUA). This EUA will remain in effect (meaning this test can be used) for the duration of the COVID-19 declaration under Section 564(b)(1) of the Act, 21 U.S.C. section 360bbb-3(b)(1), unless the authorization is terminated or revoked.  Performed at [facility], [address]  [place], [place] [address]   Blood culture (routine x 2)     Status: None (Preliminary result)   Collection Time: 05/12/22 11:32 PM   Specimen: BLOOD  Result Value Ref Range Status   Specimen Description BLOOD SITE NOT SPECIFIED  Final   Special Requests   Final    BOTTLES DRAWN AEROBIC AND ANAEROBIC Blood Culture results may not be optimal due to an excessive volume of blood received in  culture bottles   Culture   Final    NO GROWTH 3 DAYS Performed at [facility], [address] [place], [place] [address]    Report Status PENDING  Incomplete  Blood culture (routine x 2)     Status: None (Preliminary result)   Collection Time: 05/12/22 11:32 PM   Specimen: BLOOD  Result Value Ref Range Status   Specimen Description BLOOD SITE NOT SPECIFIED  Final   Special Requests   Final    BOTTLES DRAWN AEROBIC AND ANAEROBIC Blood Culture results may not be optimal due to an excessive volume of blood received in culture bottles   Culture   Final    NO GROWTH 3 DAYS Performed at [facility], [address], [place], [place] [address]    Report Status PENDING  Incomplete     Medications:    apixaban  2.5 mg Oral BID   Chlorhexidine Gluconate Cloth  6 each Topical Daily   fesoterodine  4 mg Oral Daily   sodium chloride flush  3 mL Intravenous Q12H   Continuous Infusions:  sodium chloride 125 mL/hr at 05/16/22 1141      LOS: 3 days   Marinda Elk  [facility]  05/16/2022, 12:11 PM

## 2022-05-16 NOTE — NC FL2 (Signed)
[place] MEDICAID FL2 LEVEL OF CARE FORM     IDENTIFICATION  Patient Name: James Dyer Birthdate: 10/16/1945 Sex: male Admission Date (Current Location): 05/12/2022  [facility] and [place] Number:  Producer, television/film/video and Address:  [facility] [place], [address] [place], [place] [address]      Provider Number: [id number]  Attending Physician Name and Address:  Marinda Elk, MD  Relative Name and Phone Number:       Current Level of Care: Hospital Recommended Level of Care: Skilled Nursing Facility Prior Approval Number:    Date Approved/Denied:   PASRR Number: [id number]  Discharge Plan: SNF    Current Diagnoses: Patient Active Problem List   Diagnosis Date Noted   Hypotension due to hypovolemia 05/15/2022   Acute urinary retention 05/15/2022   Hypomagnesemia 05/14/2022   AKI (acute kidney injury) 05/13/2022   Acute metabolic encephalopathy 05/13/2022   Hypoglycemia 05/13/2022   Fall 05/13/2022   Acute kidney injury 05/12/2022   Paroxysmal atrial fibrillation 05/12/2022   Elevated CK 05/12/2022   Fall at home, initial encounter 05/12/2022   Altered mental status 05/12/2022   Thrombocytopenia 05/12/2022   Fever 05/12/2022   Persistent atrial fibrillation    HNP (herniated nucleus pulposus), lumbar 09/01/2013   HTN (hypertension) 03/19/2013   Hyperlipemia 03/19/2013   Type 2 diabetes mellitus with hypoglycemia 03/19/2013   Chest pain 03/19/2013   Pain in joint, shoulder region 10/23/2012   Muscle weakness (generalized) 10/23/2012   S/P rotator cuff repair 10/23/2012   Complete tear of rotator cuff 09/09/2012   History of radiation therapy    Prostate cancer 12/18/2010    Orientation RESPIRATION BLADDER Height & Weight     Self, Time, Situation, Place  Normal Continent, External catheter Weight: 223 lb 8.7 oz (101.4 kg) Height:   (175.3 cm)  BEHAVIORAL SYMPTOMS/MOOD NEUROLOGICAL BOWEL NUTRITION STATUS       Continent Diet (See dc summary)  AMBULATORY STATUS COMMUNICATION OF NEEDS Skin   Extensive Assist Verbally Normal                       Personal Care Assistance Level of Assistance  Bathing, Feeding, Dressing Bathing Assistance: Maximum assistance Feeding assistance: Independent Dressing Assistance: Maximum assistance     Functional Limitations Info  Sight, Hearing, Speech Sight Info: Impaired Hearing Info: Adequate Speech Info: Adequate    SPECIAL CARE FACTORS FREQUENCY  PT (By licensed PT), OT (By licensed OT)     PT Frequency: 5xweek OT Frequency: 5xweek            Contractures Contractures Info: Not present    Additional Factors Info  Code Status, Allergies Code Status Info: Full Allergies Info: Latex           Current Medications (05/16/2022):  This is the current hospital active medication list Current Facility-Administered Medications  Medication Dose Route Frequency Provider Last Rate Last Admin   0.9 %  sodium chloride infusion   Intravenous Continuous Marinda Elk, MD 50 mL/hr at 05/16/22 1217 Rate Change at 05/16/22 1217   acetaminophen (TYLENOL) tablet 650 mg  650 mg Oral Q6H PRN Charlsie Quest, MD       Or   acetaminophen (TYLENOL) suppository 650 mg  650 mg Rectal Q6H PRN Charlsie Quest, MD       apixaban Everlene Balls) tablet 2.5 mg  2.5 mg Oral BID Rodolph Bong, MD   2.5 mg at 05/16/22 [phone]  Chlorhexidine Gluconate Cloth 2 % PADS 6 each  6 each Topical Daily Marinda Elk, MD   6 each at 05/16/22 1142   [START ON 05/17/2022] cosyntropin (CORTROSYN) injection 0.25 mg  0.25 mg Intravenous Once David Stall, Darin Engels, MD       dextrose 50 % solution 12.5 g  12.5 g Intravenous PRN Darreld Mclean R, MD   12.5 g at 05/13/22 0509   fesoterodine (TOVIAZ) tablet 4 mg  4 mg Oral Daily Rodolph Bong, MD   4 mg at 05/16/22 0826   fluticasone (FLONASE) 50 MCG/ACT nasal spray 2 spray  2 spray Each Nare Daily PRN Rodolph Bong, MD        LORazepam (ATIVAN) injection 1 mg  1 mg Intravenous Once PRN Charlsie Quest, MD       magnesium oxide (MAG-OX) tablet 400 mg  400 mg Oral BID Marinda Elk, MD   400 mg at 05/16/22 1244   menthol-cetylpyridinium (CEPACOL) lozenge 3 mg  1 lozenge Oral PRN Rodolph Bong, MD       ondansetron [facility]) tablet 4 mg  4 mg Oral Q6H PRN Charlsie Quest, MD       Or   ondansetron (ZOFRAN) injection 4 mg  4 mg Intravenous Q6H PRN Charlsie Quest, MD       phenol (CHLORASEPTIC) mouth spray 1 spray  1 spray Mouth/Throat PRN Rodolph Bong, MD   1 spray at 05/13/22 2215   potassium chloride SA (KLOR-CON M) CR tablet 40 mEq  40 mEq Oral BID Marinda Elk, MD   40 mEq at 05/16/22 1243   QUEtiapine (SEROQUEL) tablet 25 mg  25 mg Oral QHS PRN Rodolph Bong, MD   25 mg at 05/15/22 1950   senna-docusate (Senokot-S) tablet 1 tablet  1 tablet Oral QHS PRN Darreld Mclean R, MD       sodium chloride flush (NS) 0.9 % injection 3 mL  3 mL Intravenous Q12H Darreld Mclean R, MD   3 mL at 05/16/22 0827   tamsulosin (FLOMAX) capsule 0.4 mg  0.4 mg Oral QPC breakfast Marinda Elk, MD   0.4 mg at 05/16/22 1244     Discharge Medications: Please see discharge summary for a list of discharge medications.  Relevant Imaging Results:  Relevant Lab Results:   Additional Information SSN: [id number]  Oletta Lamas, MSW, [facility] Transitions of Care  Clinical Social Worker I

## 2022-05-16 NOTE — Care Management Important Message (Signed)
Important Message  Patient Details  Name: James Dyer MRN: [id number] Date of Birth: 08-Jun-1945   Medicare Important Message Given:  Yes     Renie Ora 05/16/2022, 10:05 AM

## 2022-05-17 DIAGNOSIS — N179 Acute kidney failure, unspecified: Secondary | ICD-10-CM | POA: Diagnosis not present

## 2022-05-17 DIAGNOSIS — E162 Hypoglycemia, unspecified: Secondary | ICD-10-CM | POA: Diagnosis not present

## 2022-05-17 DIAGNOSIS — W19XXXA Unspecified fall, initial encounter: Secondary | ICD-10-CM | POA: Diagnosis not present

## 2022-05-17 DIAGNOSIS — R509 Fever, unspecified: Secondary | ICD-10-CM | POA: Diagnosis not present

## 2022-05-17 LAB — BASIC METABOLIC PANEL
Anion gap: 6 (ref 5–15)
BUN: 10 mg/dL (ref 8–23)
CO2: 28 mmol/L (ref 22–32)
Calcium: 8 mg/dL — ABNORMAL LOW (ref 8.9–10.3)
Chloride: 105 mmol/L (ref 98–111)
Creatinine, Ser: 0.91 mg/dL (ref 0.61–1.24)
GFR, Estimated: >60 mL/min (ref >60)
Glucose, Bld: 146 mg/dL — ABNORMAL HIGH (ref 70–99)
Potassium: 4 mmol/L (ref 3.5–5.1)
Sodium: 139 mmol/L (ref 135–145)

## 2022-05-17 LAB — GLUCOSE, CAPILLARY
Glucose-Capillary: 147 mg/dL — ABNORMAL HIGH (ref 70–99)
Glucose-Capillary: 148 mg/dL — ABNORMAL HIGH (ref 70–99)
Glucose-Capillary: 164 mg/dL — ABNORMAL HIGH (ref 70–99)
Glucose-Capillary: 190 mg/dL — ABNORMAL HIGH (ref 70–99)
Glucose-Capillary: 208 mg/dL — ABNORMAL HIGH (ref 70–99)

## 2022-05-17 LAB — ACTH STIMULATION, 3 TIME POINTS
Cortisol, 30 Min: 23 ug/dL
Cortisol, 60 Min: 28 ug/dL
Cortisol, Base: 9.6 ug/dL

## 2022-05-17 LAB — CULTURE, BLOOD (ROUTINE X 2)

## 2022-05-17 MED ORDER — METOPROLOL TARTRATE 12.5 MG HALF TABLET
12.5000 mg | ORAL_TABLET | Freq: Two times a day (BID) | ORAL | Status: DC
  Administered 2022-05-17 – 2022-05-22 (×11): 12.5 mg via ORAL
  Filled 2022-05-17 (×11): qty 1

## 2022-05-17 NOTE — TOC Progression Note (Signed)
Transition of Care [facility] - Progression Note    Patient Details  Name: James Dyer MRN: [id number] Date of Birth: 1945/03/24  Transition of Care [facility]) CM/SW Contact  Leander Rams, LCSW Phone Number: 05/17/2022, 3:54 PM  Clinical Narrative:    Pt received a bed offer from [facility] which is pt's preferred SNF. [name] spoke with pt at bedside to notify him of offer from [facility]. Kerri from [facility] informed [name] that they have a bed available for pt whenever he is ready and that he will need a COVID test at least 24 hours before admitting to SNF. [name] communicated this information with MD.   TOC will continue to follow this admission.    Expected Discharge Plan: Skilled Nursing Facility Barriers to Discharge: Continued Medical Work up  Expected Discharge Plan and Services       Living arrangements for the past 2 months: Single Family Home                                       Social Determinants of Health (SDOH) Interventions SDOH Screenings   Food Insecurity: No Food Insecurity (05/13/2022)  Housing: Low Risk  (05/13/2022)  Transportation Needs: No Transportation Needs (05/13/2022)  Utilities: Not At Risk (05/13/2022)  Tobacco Use: Medium Risk (05/12/2022)    Readmission Risk Interventions     No data to display         Oletta Lamas, MSW, LCSWA, LCASA Transitions of Care  Clinical Social Worker I

## 2022-05-17 NOTE — Progress Notes (Signed)
Speech Language Pathology Treatment: Cognitive-Linquistic  Patient Details Name: James Dyer MRN: [id number] DOB: 1945-12-30 Today's Date: 05/17/2022 Time: 4098-1191 SLP Time Calculation (min) (ACUTE ONLY): 25 min  Assessment / Plan / Recommendation Clinical Impression  Pt seen for cognitive treatment focusing on attention, memory and problem solving with brother present for part of session. He had difficulty sustaining attention due to lethargy- he was repositioned upright but needed frequent verbal and tactile cues due to falling asleep and struggled with activities. Discussed the use of written information for prospective memory and attempted pt taking notes listening to mock voicemail. He perseverated on writing one detail of message for 5 minutes with multiple attempts to write one word. SLP transitioned to different task of answering questions, locating information and adding/multiplying from a menu. Pt again became drowsy throughout and when he was able to sustain his attention with cues he could problem solve through questions however with multiple repetition needed and visual cues with 100% accuracy and simple mental math to 100%. Pt's brother states he typically sleeps a lot during the day and stated on eval that his cognition is different than baseline. ST will continue to follow.    HPI HPI: Pt is a 77 y.o. male who presented 05/12/22 s/p fall with AMS. Imaging showed no acute intracranial abnormality, no acute displaced facial fracture, no acute displaced fracture or traumatic listhesis of the C-spine. CT chest/abdomen/pelvis with contrast negative for acute fracture or solid organ injury. Admitted with AKI and hypoglycemia. PMH: PAF on Eliquis, T2DM, HTN, HLD, chronic bilateral foot drop, history of prostate cancer      SLP Plan  Continue with current plan of care      Recommendations for follow up therapy are one component of a multi-disciplinary discharge planning process, led  by the attending physician.  Recommendations may be updated based on patient status, additional functional criteria and insurance authorization.    Recommendations                     Oral care BID   Frequent or constant Supervision/Assistance Cognitive communication deficit (Y78.295)     Continue with current plan of care     Royce Macadamia  05/17/2022, 11:54 AM

## 2022-05-17 NOTE — Progress Notes (Signed)
[facility] PROGRESS NOTE    Progress Note  [name]  ZOX:[id number] DOB: [birth year] DOA: 05/12/2022 PCP: [name], MD     Brief Narrative:   James Dyer is an 77 y.o. male past medical history significant for paroxysmal atrial fibrillation on Eliquis, diabetes mellitus type 2, chronic bilateral foot drop, history of prostate cancer admitted for acute kidney injury and altered mental status after falling at home.   Assessment/Plan:   Acute kidney injury Likely prerenal azotemia creatinine on admission was 1.4 with a baseline approximate 1.1, CK 400. After hydration CK improved along with renal function. Developed hypotension overnight and acute urinary retention Foley was placed started on IV fluids and albumin and his creatinine has returned to baseline.  Continue IV hydration and oral hydration.  BPH: Cont. Flomax p.o. follow-up with urology as an outpatient. Will need a voiding trial.  Acute metabolic encephalopathy: Likely secondary to hypoglycemia CT of the head showed no acute findings, MRI of the head showed no acute abnormalities. After correction of hypoglycemia his drowsiness resolved.  Hypotension: Likely hypovolemic episode patient was pancultured with no signs and symptoms of infection. TSH was unremarkable no signs of bleeding. A.m. cortisol 6.8 cosyntropin 30 and 60 minutes was greater then 18 ug/dL, which rules out adrenal insufficiency. Procalcitonin was unremarkable  Sinus tachycardia: Likely due to being off his metoprolol will start low-dose. Blood pressure is elevated today we will have to go home off ARB.  Fall at home/chronic bilateral foot drop: Underwent extensive imaging with no significant fracture or traumatic injuries. Unclear falls called but his CBG was 30 on presentation. PT OT recommended acute inpatient rehab.  Diabetes mellitus type 2 with hypoglycemic episodes: Hypoglycemia likely contributing to encephalopathy  and falls. Patient received D50 in the ED placed on D5 with normal saline. A1c is 5.9. Will go home off oral hypoglycemic agents. Cosyntropin test was unremarkable.  Fever: Of unclear source with a Tmax of 102.5 rectally in the ED. Infectious workup has been negative till date. Continue to monitor off antibiotics.  Paroxysmal atrial fibrillation: Rate control on Lopressor. Eliquis has been held due to new thrombocytopenia and periorbital ecchymosis. The case was discussed with hematology no contraindication to anticoagulation as long as platelets are greater than 50,000. Hemoglobin has remained stable. Eliquis has been resumed.  Ascending aortic aneurysm: Will follow-up by PCP with a CTA as an outpatient. Will need of patient follow-up with vascular surgery.  Electrolyte imbalance hypokalemia/hypomagnesemia: Try to keep potassium greater than 4 magnesium greater than 2.  Memory issues: CT of brain showed no acute cranial abnormalities there is significant age-related atrophy probably due to chronic microvascular ischemic changes. Workup has been negative. Will need ambulatory referral as an outpatient.  Acute urinary retention Foley catheter inserted restart Flomax now that his blood pressure is improved. Will need a voiding trial  tomorrow.    DVT prophylaxis: lovenox Family Communication:Daughter in-law, wife Status is: Inpatient Remains inpatient appropriate because: Acute kidney injury    Code Status:     Code Status Orders  (From admission, onward)           Start     Ordered   05/12/22 2334  Full code  Continuous       Question:  By:  Answer:  Consent: discussion documented in EHR   05/12/22 2335           Code Status History     Date Active Date Inactive Code Status Order ID Comments User  Context   09/01/2013 2050 09/02/2013 1513 Full Code [id number]  Coletta Memos, MD Inpatient   09/09/2012 1347 09/10/2012 1216 Full Code [id number]  Jacki Cones,  MD Inpatient         IV Access:   Peripheral IV   Procedures and diagnostic studies:   No results found.   Medical Consultants:   None.   Subjective:    James Dyer no complaints  Objective:    Vitals:   05/16/22 2330 05/17/22 0422 05/17/22 0743 05/17/22 0756  BP: 128/84 118/84 (!) 145/95   Pulse: 81  (!) 104   Resp: 20 20 (!) 21 20  Temp: 98.5 F (36.9 C) 98.5 F (36.9 C) 98 F (36.7 C)   TempSrc: Oral Oral Oral   SpO2: 94%  97%   Weight:  100.4 kg    Height:       SpO2: 97 % O2 Flow Rate (L/min): 2 L/min   Intake/Output Summary (Last 24 hours) at 05/17/2022 0921 Last data filed at 05/17/2022 0909 Gross per 24 hour  Intake 2276.67 ml  Output 1900 ml  Net 376.67 ml    Filed Weights   05/15/22 0348 05/16/22 0419 05/17/22 0422  Weight: 103.4 kg 101.4 kg 100.4 kg    Exam: General exam: In no acute distress. Respiratory system: Good air movement and clear to auscultation. Cardiovascular system: S1 & S2 heard, RRR. No JVD. Gastrointestinal system: Abdomen is nondistended, soft and nontender.  Extremities: No pedal edema. Skin: No rashes, lesions or ulcers Psychiatry: Judgement and insight appear normal. Mood & affect appropriate.   Data Reviewed:    Labs: Basic Metabolic Panel: Recent Labs  Lab 05/13/22 0030 05/14/22 0058 05/15/22 0057 05/16/22 0051 05/17/22 0556  NA 134* 132* 133* 137 139  K 3.3* 3.8 3.7 3.4* 4.0  CL 102 99 100 109 105  CO2 21* 23 25 24 28   GLUCOSE 78 115* 188* 113* 146*  BUN 28* 13 24* 15 10  CREATININE 1.24 1.00 1.56* 1.08 0.91  CALCIUM 7.4* 7.9* 7.7* 7.5* 8.0*  MG 1.7 1.6* 2.5* 2.0  --     GFR Estimated Creatinine Clearance: 80.7 mL/min (by C-G formula based on SCr of 0.91 mg/dL). Liver Function Tests: Recent Labs  Lab 05/12/22 1820 05/14/22 0814 05/15/22 0057  AST 62* 66* 57*  ALT 41 35 33  ALKPHOS 49 39 38  BILITOT 0.8 0.7 0.7  PROT 6.4* 5.9* 5.4*  ALBUMIN 3.6 2.9* 2.9*    No results for  input(s): "LIPASE", "AMYLASE" in the last 168 hours. Recent Labs  Lab 05/15/22 1021  AMMONIA 36*    Coagulation profile Recent Labs  Lab 05/12/22 1820 05/13/22 0030  INR 1.5* 1.4*    COVID-19 Labs  No results for input(s): "DDIMER", "FERRITIN", "LDH", "CRP" in the last 72 hours.   Lab Results  Component Value Date   SARSCOV2NAA NEGATIVE 05/12/2022   SARSCOV2NAA NEGATIVE 04/20/2019   SARSCOV2NAA Not Detected 12/19/2018   SARSCOV2NAA Not Detected 07/09/2018    CBC: Recent Labs  Lab 05/12/22 1820 05/12/22 1830 05/13/22 0030 05/14/22 0058 05/15/22 0057 05/16/22 0051  WBC 4.0  --  3.1* 4.0 3.3* 4.3  NEUTROABS  --   --   --  3.0 1.2* 3.4  HGB 14.3 14.3 12.6* 14.8 14.5 13.4  HCT 43.1 42.0 38.3* 44.0 43.2 39.8  MCV 88.5  --  89.5 87.1 87.3 86.5  PLT 106*  --  88* 69* 72* 73*    Cardiac Enzymes: Recent Labs  Lab 05/12/22 1820 05/13/22 0030  CKTOTAL 419* 381    BNP (last 3 results) No results for input(s): "PROBNP" in the last 8760 hours. CBG: Recent Labs  Lab 05/16/22 1537 05/16/22 2013 05/17/22 0009 05/17/22 0419 05/17/22 0832  GLUCAP 128* 164* 147* 148* 164*    D-Dimer: No results for input(s): "DDIMER" in the last 72 hours. Hgb A1c: No results for input(s): "HGBA1C" in the last 72 hours.  Lipid Profile: No results for input(s): "CHOL", "HDL", "LDLCALC", "TRIG", "CHOLHDL", "LDLDIRECT" in the last 72 hours. Thyroid function studies: No results for input(s): "TSH", "T4TOTAL", "T3FREE", "THYROIDAB" in the last 72 hours.  Invalid input(s): "FREET3"  Anemia work up: Recent Labs    05/14/22 1057  VITAMINB12 377  FOLATE 26.3    Sepsis Labs: Recent Labs  Lab 05/12/22 1820 05/13/22 0030 05/14/22 0058 05/15/22 0057 05/15/22 1021 05/16/22 0051  PROCALCITON  --   --   --   --  <0.10  --   WBC 4.0 3.1* 4.0 3.3*  --  4.3  LATICACIDVEN 0.9  --   --   --   --   --     Microbiology Recent Results (from the past 240 hour(s))  Resp panel by  RT-PCR (RSV, Flu A&B, Covid) Anterior Nasal Swab     Status: None   Collection Time: 05/12/22  9:00 PM   Specimen: Anterior Nasal Swab  Result Value Ref Range Status   SARS Coronavirus 2 by RT PCR NEGATIVE NEGATIVE Final   Influenza A by PCR NEGATIVE NEGATIVE Final   Influenza B by PCR NEGATIVE NEGATIVE Final    Comment: (NOTE) The Xpert Xpress SARS-CoV-2/FLU/RSV plus assay is intended as an aid in the diagnosis of influenza from Nasopharyngeal swab specimens and should not be used as a sole basis for treatment. Nasal washings and aspirates are unacceptable for Xpert Xpress SARS-CoV-2/FLU/RSV testing.  Fact Sheet for Patients: BloggerCourse.com  Fact Sheet for Healthcare Providers: SeriousBroker.it  This test is not yet approved or cleared by the [place] FDA and has been authorized for detection and/or diagnosis of SARS-CoV-2 by FDA under an Emergency Use Authorization (EUA). This EUA will remain in effect (meaning this test can be used) for the duration of the COVID-19 declaration under Section 564(b)(1) of the Act, 21 U.S.C. section 360bbb-3(b)(1), unless the authorization is terminated or revoked.     Resp Syncytial Virus by PCR NEGATIVE NEGATIVE Final    Comment: (NOTE) Fact Sheet for Patients: BloggerCourse.com  Fact Sheet for Healthcare Providers: SeriousBroker.it  This test is not yet approved or cleared by the [place] FDA and has been authorized for detection and/or diagnosis of SARS-CoV-2 by FDA under an Emergency Use Authorization (EUA). This EUA will remain in effect (meaning this test can be used) for the duration of the COVID-19 declaration under Section 564(b)(1) of the Act, 21 U.S.C. section 360bbb-3(b)(1), unless the authorization is terminated or revoked.  Performed at [facility], [address] [place], [place] [address]   Blood  culture (routine x 2)     Status: None (Preliminary result)   Collection Time: 05/12/22 11:32 PM   Specimen: BLOOD  Result Value Ref Range Status   Specimen Description BLOOD SITE NOT SPECIFIED  Final   Special Requests   Final    BOTTLES DRAWN AEROBIC AND ANAEROBIC Blood Culture results may not be optimal due to an excessive volume of blood received in culture bottles   Culture   Final    NO  GROWTH 4 DAYS Performed at [facility], [address], [place], [place] [address]    Report Status PENDING  Incomplete  Blood culture (routine x 2)     Status: None (Preliminary result)   Collection Time: 05/12/22 11:32 PM   Specimen: BLOOD  Result Value Ref Range Status   Specimen Description BLOOD SITE NOT SPECIFIED  Final   Special Requests   Final    BOTTLES DRAWN AEROBIC AND ANAEROBIC Blood Culture results may not be optimal due to an excessive volume of blood received in culture bottles   Culture   Final    NO GROWTH 4 DAYS Performed at [facility], [address] [place], [place] [address]    Report Status PENDING  Incomplete     Medications:    apixaban  2.5 mg Oral BID   Chlorhexidine Gluconate Cloth  6 each Topical Daily   fesoterodine  4 mg Oral Daily   sodium chloride flush  3 mL Intravenous Q12H   tamsulosin  0.4 mg Oral QPC breakfast   Continuous Infusions:      LOS: 4 days   Marinda Elk  [facility]  05/17/2022, 9:21 AM

## 2022-05-18 DIAGNOSIS — N179 Acute kidney failure, unspecified: Secondary | ICD-10-CM | POA: Diagnosis not present

## 2022-05-18 LAB — CBC WITH DIFFERENTIAL/PLATELET
Abs Immature Granulocytes: 0 10*3/uL (ref 0.00–0.07)
Basophils Absolute: 0 10*3/uL (ref 0.0–0.1)
Basophils Relative: 0 %
Eosinophils Absolute: 0.1 10*3/uL (ref 0.0–0.5)
Eosinophils Relative: 1 %
HCT: 39.2 % (ref 39.0–52.0)
Hemoglobin: 13.2 g/dL (ref 13.0–17.0)
Lymphocytes Relative: 30 %
Lymphs Abs: 2 10*3/uL (ref 0.7–4.0)
MCH: 29.6 pg (ref 26.0–34.0)
MCHC: 33.7 g/dL (ref 30.0–36.0)
MCV: 87.9 fL (ref 80.0–100.0)
Monocytes Absolute: 0.4 10*3/uL (ref 0.1–1.0)
Monocytes Relative: 6 %
Neutro Abs: 4.2 10*3/uL (ref 1.7–7.7)
Neutrophils Relative %: 63 %
Platelets: 131 10*3/uL — ABNORMAL LOW (ref 150–400)
RBC: 4.46 MIL/uL (ref 4.22–5.81)
RDW: 13.1 % (ref 11.5–15.5)
WBC: 6.7 10*3/uL (ref 4.0–10.5)
nRBC: 0 % (ref 0.0–0.2)
nRBC: 0 /100 WBC

## 2022-05-18 LAB — URINALYSIS, ROUTINE W REFLEX MICROSCOPIC
Bilirubin Urine: NEGATIVE
Glucose, UA: NEGATIVE mg/dL
Ketones, ur: NEGATIVE mg/dL
Nitrite: NEGATIVE
Protein, ur: >=300 mg/dL — AB
RBC / HPF: >50 RBC/hpf (ref 0–5)
Specific Gravity, Urine: 1.019 (ref 1.005–1.030)
WBC, UA: >50 WBC/hpf (ref 0–5)
pH: 6 (ref 5.0–8.0)

## 2022-05-18 LAB — GLUCOSE, CAPILLARY
Glucose-Capillary: 155 mg/dL — ABNORMAL HIGH (ref 70–99)
Glucose-Capillary: 135 mg/dL — ABNORMAL HIGH (ref 70–99)
Glucose-Capillary: 148 mg/dL — ABNORMAL HIGH (ref 70–99)
Glucose-Capillary: 163 mg/dL — ABNORMAL HIGH (ref 70–99)
Glucose-Capillary: 180 mg/dL — ABNORMAL HIGH (ref 70–99)
Glucose-Capillary: 163 mg/dL — ABNORMAL HIGH (ref 70–99)

## 2022-05-18 LAB — BASIC METABOLIC PANEL
Anion gap: 9 (ref 5–15)
BUN: 12 mg/dL (ref 8–23)
CO2: 26 mmol/L (ref 22–32)
Calcium: 8.4 mg/dL — ABNORMAL LOW (ref 8.9–10.3)
Chloride: 105 mmol/L (ref 98–111)
Creatinine, Ser: 0.89 mg/dL (ref 0.61–1.24)
GFR, Estimated: >60 mL/min (ref >60)
Glucose, Bld: 172 mg/dL — ABNORMAL HIGH (ref 70–99)
Potassium: 3.7 mmol/L (ref 3.5–5.1)
Sodium: 140 mmol/L (ref 135–145)

## 2022-05-18 LAB — CULTURE, BLOOD (ROUTINE X 2): Culture: NO GROWTH

## 2022-05-18 NOTE — Progress Notes (Signed)
Physical Therapy Treatment Patient Details Name: James Dyer MRN: [id number] DOB: 11/02/1945 Today's Date: 05/18/2022   History of Present Illness Pt is a 77 y.o. male who presented 05/12/22 s/p fall with AMS. Imaging showed no acute intracranial abnormality, no acute displaced facial fracture, no acute displaced fracture or traumatic listhesis of the C-spine. CT chest/abdomen/pelvis with contrast negative for acute fracture or solid organ injury. Admitted with AKI and hypoglycemia. PMH: PAF on Eliquis, T2DM, HTN, HLD, chronic bilateral foot drop, history of prostate cancer    PT Comments    Pt tolerated treatment well today. Pt was able to progress ambulation in the hallway with RW at min guard level and a chair follow for safety. Pt required total A to don AFO however appears to have improvement in cognition. No change in DC/DME recs at this time. PT will continue to follow.   Recommendations for follow up therapy are one component of a multi-disciplinary discharge planning process, led by the attending physician.  Recommendations may be updated based on patient status, additional functional criteria and insurance authorization.  Follow Up Recommendations  Can patient physically be transported by private vehicle: No    Assistance Recommended at Discharge Frequent or constant Supervision/Assistance  Patient can return home with the following Two people to help with walking and/or transfers;A lot of help with bathing/dressing/bathroom;Assistance with cooking/housework;Direct supervision/assist for financial management;Direct supervision/assist for medications management;Assist for transportation;Help with stairs or ramp for entrance   Equipment Recommendations  Other (comment) (per accepting facility)    Recommendations for Other Services       Precautions / Restrictions Precautions Precautions: Fall;Other (comment) Precaution Comments: watch vitals; bil foot drop with braces at  baseline Required Braces or Orthoses: Other Brace Other Brace: BLE AFOs for OOB mobility Restrictions Weight Bearing Restrictions: No     Mobility  Bed Mobility Overal bed mobility: Needs Assistance Bed Mobility: Supine to Sit     Supine to sit: Supervision, HOB elevated          Transfers Overall transfer level: Needs assistance Equipment used: Rolling walker (2 wheels) Transfers: Sit to/from Stand Sit to Stand: Min assist                Ambulation/Gait Ambulation/Gait assistance: Min guard, +2 safety/equipment Gait Distance (Feet): 200 Feet (chair follow. 1 seated rest break to tie shoes.) Assistive device: Rolling walker (2 wheels) Gait Pattern/deviations: Decreased stride length, Step-through pattern, Trunk flexed Gait velocity: decreased     General Gait Details: Cues for upright posture. no LOB noted.   Stairs             Wheelchair Mobility    Modified Rankin (Stroke Patients Only)       Balance Overall balance assessment: Needs assistance Sitting-balance support: Single extremity supported, Bilateral upper extremity supported, Feet supported Sitting balance-Leahy Scale: Good Sitting balance - Comments: able to sit EOB. no posterior lean noted. Total A to don AFO.   Standing balance support: Bilateral upper extremity supported, Reliant on assistive device for balance Standing balance-Leahy Scale: Fair Standing balance comment: Reliant on RW                            Cognition Arousal/Alertness: Awake/alert Behavior During Therapy: Flat affect Overall Cognitive Status: Impaired/Different from baseline                     Current Attention Level: Sustained   Following Commands: Follows one  step commands with increased time Safety/Judgement: Decreased awareness of safety, Decreased awareness of deficits              Exercises      General Comments General comments (skin integrity, edema, etc.): VSS on  RA      Pertinent Vitals/Pain Pain Assessment Pain Assessment: No/denies pain    Home Living                          Prior Function            PT Goals (current goals can now be found in the care plan section) Progress towards PT goals: Progressing toward goals    Frequency    Min 1X/week      PT Plan Current plan remains appropriate    Co-evaluation              AM-PAC PT "6 Clicks" Mobility   Outcome Measure  Help needed turning from your back to your side while in a flat bed without using bedrails?: A Little Help needed moving from lying on your back to sitting on the side of a flat bed without using bedrails?: A Little Help needed moving to and from a bed to a chair (including a wheelchair)?: A Little Help needed standing up from a chair using your arms (e.g., wheelchair or bedside chair)?: A Lot Help needed to walk in hospital room?: A Lot Help needed climbing 3-5 steps with a railing? : A Lot 6 Click Score: 15    End of Session Equipment Utilized During Treatment: Gait belt Activity Tolerance: Patient tolerated treatment well Patient left: in bed;with call bell/phone within reach;with family/visitor present Nurse Communication: Mobility status PT Visit Diagnosis: Unsteadiness on feet (R26.81);Muscle weakness (generalized) (M62.81);History of falling (Z91.81);Repeated falls (R29.6);Difficulty in walking, not elsewhere classified (R26.2)     Time: 1914-7829 PT Time Calculation (min) (ACUTE ONLY): 18 min  Charges:  $Gait Training: 8-22 mins                     Shela Nevin, PT, DPT Acute Rehab Services [phone]    Gladys Damme 05/18/2022, 1:19 PM

## 2022-05-18 NOTE — Progress Notes (Signed)
[facility] PROGRESS NOTE    Progress Note  [name]  ZOX:[id number] DOB: [birth year] DOA: 05/12/2022 PCP: [name], MD     Brief Narrative:   James Dyer is an 77 y.o. male past medical history significant for paroxysmal atrial fibrillation on Eliquis, diabetes mellitus type 2, chronic bilateral foot drop, history of prostate cancer admitted for acute kidney injury and altered mental status after falling at home.  05/18/2022: Patient seen alongside patient's 2 sisters and nurse.  Bloody urine is noted.  Proceed with UA and CBC.  Further management depend on above.  Patient may require three-way Foley catheter.   Assessment/Plan:   Acute kidney injury Likely prerenal azotemia creatinine on admission was 1.4 with a baseline approximate 1.1, CK 400. After hydration CK improved along with renal function. Developed hypotension overnight and acute urinary retention Foley was placed started on IV fluids and albumin and his creatinine has returned to baseline.  Continue IV hydration and oral hydration. 05/18/2022: AKI has resolved.  BPH: Cont. Flomax p.o. follow-up with urology as an outpatient. Will need a voiding trial.  Acute metabolic encephalopathy: Likely secondary to hypoglycemia CT of the head showed no acute findings, MRI of the head showed no acute abnormalities. After correction of hypoglycemia his drowsiness resolved.  Hypotension: Likely hypovolemic episode patient was pancultured with no signs and symptoms of infection. TSH was unremarkable no signs of bleeding. A.m. cortisol 6.8 cosyntropin 30 and 60 minutes was greater then 18 ug/dL, which rules out adrenal insufficiency. Procalcitonin was unremarkable  Sinus tachycardia: Likely due to being off his metoprolol will start low-dose. Blood pressure is elevated today we will have to go home off ARB.  Fall at home/chronic bilateral foot drop: Underwent extensive imaging with no significant fracture  or traumatic injuries. Unclear falls called but his CBG was 30 on presentation. PT OT recommended acute inpatient rehab.  Diabetes mellitus type 2 with hypoglycemic episodes: Hypoglycemia likely contributing to encephalopathy and falls. Patient received D50 in the ED placed on D5 with normal saline. A1c is 5.9. Will go home off oral hypoglycemic agents. Cosyntropin test was unremarkable.  Fever: Of unclear source with a Tmax of 102.5 rectally in the ED. Infectious workup has been negative till date. Continue to monitor off antibiotics. 05/18/2022: Resolved.  Paroxysmal atrial fibrillation: Rate control on Lopressor. Eliquis has been held due to new thrombocytopenia and periorbital ecchymosis. The case was discussed with hematology no contraindication to anticoagulation as long as platelets are greater than 50,000. Hemoglobin has remained stable. Eliquis has been resumed.  Ascending aortic aneurysm: Will follow-up by PCP with a CTA as an outpatient. Will need of patient follow-up with vascular surgery.  Electrolyte imbalance hypokalemia/hypomagnesemia: Try to keep potassium greater than 4 magnesium greater than 2.  Memory issues: CT of brain showed no acute cranial abnormalities there is significant age-related atrophy probably due to chronic microvascular ischemic changes. Workup has been negative. Will need ambulatory referral as an outpatient.  Acute urinary retention Foley catheter inserted restart Flomax now that his blood pressure is improved. Will need a voiding trial when patient follows up with urology.    DVT prophylaxis: lovenox Family Communication:Daughter in-law, wife Status is: Inpatient Remains inpatient appropriate because: Acute kidney injury    Code Status:     Code Status Orders  (From admission, onward)           Start     Ordered   05/12/22 2334  Full code  Continuous  Question:  By:  Answer:  Consent: discussion documented in EHR    05/12/22 2335           Code Status History     Date Active Date Inactive Code Status Order ID Comments User Context   09/01/2013 2050 09/02/2013 1513 Full Code [id number]  Coletta Memos, MD Inpatient   09/09/2012 1347 09/10/2012 1216 Full Code [id number]  Jacki Cones, MD Inpatient         IV Access:   Peripheral IV   Procedures and diagnostic studies:   No results found.   Medical Consultants:   None.   Subjective:    James Dyer no complaints  Objective:    Vitals:   05/18/22 0014 05/18/22 0015 05/18/22 0400 05/18/22 0753  BP:  (!) 126/90 (!) 141/83 (!) 121/99  Pulse: 93 95 77 60  Resp:    18  Temp:  98 F (36.7 C) 97.8 F (36.6 C) 97.8 F (36.6 C)  TempSrc:  Oral Oral Oral  SpO2: 99% 98% 99% 94%  Weight:   100.4 kg   Height:       SpO2: 94 % O2 Flow Rate (L/min): 2 L/min   Intake/Output Summary (Last 24 hours) at 05/18/2022 1344 Last data filed at 05/18/2022 0900 Gross per 24 hour  Intake 840 ml  Output 600 ml  Net 240 ml    Filed Weights   05/16/22 0419 05/17/22 0422 05/18/22 0400  Weight: 101.4 kg 100.4 kg 100.4 kg    Exam: General exam: In no acute distress.  Periorbital hematoma. Respiratory system: Good air movement and clear to auscultation. Cardiovascular system: S1 & S2 heard Gastrointestinal system: Abdomen is obese, soft and nontender.    Data Reviewed:    Labs: Basic Metabolic Panel: Recent Labs  Lab 05/13/22 0030 05/14/22 0058 05/15/22 0057 05/16/22 0051 05/17/22 0556 05/18/22 0037  NA 134* 132* 133* 137 139 140  K 3.3* 3.8 3.7 3.4* 4.0 3.7  CL 102 99 100 109 105 105  CO2 21* GLUCOSE 78 115* 188* 113* 146* 172*  BUN 28* 13 24* CREATININE 1.24 1.00 1.56* 1.08 0.91 0.89  CALCIUM 7.4* 7.9* 7.7* 7.5* 8.0* 8.4*  MG 1.7 1.6* 2.5* 2.0  --   --     GFR Estimated Creatinine Clearance: 82.5 mL/min (by C-G formula based on SCr of 0.89 mg/dL). Liver Function Tests: Recent Labs   Lab 05/12/22 1820 05/14/22 0814 05/15/22 0057  AST 62* 66* 57*  ALT 41 35 33  ALKPHOS 49 39 38  BILITOT 0.8 0.7 0.7  PROT 6.4* 5.9* 5.4*  ALBUMIN 3.6 2.9* 2.9*    No results for input(s): "LIPASE", "AMYLASE" in the last 168 hours. Recent Labs  Lab 05/15/22 1021  AMMONIA 36*    Coagulation profile Recent Labs  Lab 05/12/22 1820 05/13/22 0030  INR 1.5* 1.4*    COVID-19 Labs  No results for input(s): "DDIMER", "FERRITIN", "LDH", "CRP" in the last 72 hours.   Lab Results  Component Value Date   SARSCOV2NAA NEGATIVE 05/12/2022   SARSCOV2NAA NEGATIVE 04/20/2019   SARSCOV2NAA Not Detected 12/19/2018   SARSCOV2NAA Not Detected 07/09/2018    CBC: Recent Labs  Lab 05/12/22 1820 05/12/22 1830 05/13/22 0030 05/14/22 0058 05/15/22 0057 05/16/22 0051  WBC 4.0  --  3.1* 4.0 3.3* 4.3  NEUTROABS  --   --   --  3.0 1.2* 3.4  HGB 14.3 14.3 12.6* 14.8 14.5 13.4  HCT 43.1 42.0 38.3* 44.0 43.2 39.8  MCV 88.5  --  89.5 87.1 87.3 86.5  PLT 106*  --  88* 69* 72* 73*    Cardiac Enzymes: Recent Labs  Lab 05/12/22 1820 05/13/22 0030  CKTOTAL 419* 381    BNP (last 3 results) No results for input(s): "PROBNP" in the last 8760 hours. CBG: Recent Labs  Lab 05/17/22 1619 05/17/22 2042 05/18/22 0507 05/18/22 0751 05/18/22 1113  GLUCAP 190* 155* 135* 148* 163*    D-Dimer: No results for input(s): "DDIMER" in the last 72 hours. Hgb A1c: No results for input(s): "HGBA1C" in the last 72 hours.  Lipid Profile: No results for input(s): "CHOL", "HDL", "LDLCALC", "TRIG", "CHOLHDL", "LDLDIRECT" in the last 72 hours. Thyroid function studies: No results for input(s): "TSH", "T4TOTAL", "T3FREE", "THYROIDAB" in the last 72 hours.  Invalid input(s): "FREET3"  Anemia work up: No results for input(s): "VITAMINB12", "FOLATE", "FERRITIN", "TIBC", "IRON", "RETICCTPCT" in the last 72 hours.  Sepsis Labs: Recent Labs  Lab 05/12/22 1820 05/13/22 0030 05/14/22 0058  05/15/22 0057 05/15/22 1021 05/16/22 0051  PROCALCITON  --   --   --   --  <0.10  --   WBC 4.0 3.1* 4.0 3.3*  --  4.3  LATICACIDVEN 0.9  --   --   --   --   --     Microbiology Recent Results (from the past 240 hour(s))  Resp panel by RT-PCR (RSV, Flu A&B, Covid) Anterior Nasal Swab     Status: None   Collection Time: 05/12/22  9:00 PM   Specimen: Anterior Nasal Swab  Result Value Ref [place] Status   SARS Coronavirus 2 by RT PCR NEGATIVE NEGATIVE Final   Influenza A by PCR NEGATIVE NEGATIVE Final   Influenza B by PCR NEGATIVE NEGATIVE Final    Comment: (NOTE) The Xpert Xpress SARS-CoV-2/FLU/RSV plus assay is intended as an aid in the diagnosis of influenza from Nasopharyngeal swab specimens and should not be used as a sole basis for treatment. Nasal washings and aspirates are unacceptable for Xpert Xpress SARS-CoV-2/FLU/RSV testing.  Fact Sheet for Patients: BloggerCourse.com  Fact Sheet for Healthcare Providers: SeriousBroker.it  This test is not yet approved or cleared by the [place] FDA and has been authorized for detection and/or diagnosis of SARS-CoV-2 by FDA under an Emergency Use Authorization (EUA). This EUA will remain in effect (meaning this test can be used) for the duration of the COVID-19 declaration under Section 564(b)(1) of the Act, 21 U.S.C. section 360bbb-3(b)(1), unless the authorization is terminated or revoked.     Resp Syncytial Virus by PCR NEGATIVE NEGATIVE Final    Comment: (NOTE) Fact Sheet for Patients: BloggerCourse.com  Fact Sheet for Healthcare Providers: SeriousBroker.it  This test is not yet approved or cleared by the [place] FDA and has been authorized for detection and/or diagnosis of SARS-CoV-2 by FDA under an Emergency Use Authorization (EUA). This EUA will remain in effect (meaning this test can be used) for the duration  of the COVID-19 declaration under Section 564(b)(1) of the Act, 21 U.S.C. section 360bbb-3(b)(1), unless the authorization is terminated or revoked.  Performed at [facility], [address], [place], [place] [address]   Blood culture (routine x 2)     Status: None   Collection Time: 05/12/22 11:32 PM   Specimen: BLOOD  Result Value Ref [place] Status   Specimen Description BLOOD SITE NOT SPECIFIED  Final   Special Requests   Final  BOTTLES DRAWN AEROBIC AND ANAEROBIC Blood Culture results may not be optimal due to an excessive volume of blood received in culture bottles   Culture   Final    NO GROWTH 5 DAYS Performed at [facility], [address], [place], [place] [address]    Report Status 05/18/2022 FINAL  Final  Blood culture (routine x 2)     Status: None   Collection Time: 05/12/22 11:32 PM   Specimen: BLOOD  Result Value Ref [place] Status   Specimen Description BLOOD SITE NOT SPECIFIED  Final   Special Requests   Final    BOTTLES DRAWN AEROBIC AND ANAEROBIC Blood Culture results may not be optimal due to an excessive volume of blood received in culture bottles   Culture   Final    NO GROWTH 5 DAYS Performed at [facility], [address], [place], [place] [address]    Report Status 05/18/2022 FINAL  Final     Medications:    apixaban  2.5 mg Oral BID   Chlorhexidine Gluconate Cloth  6 each Topical Daily   fesoterodine  4 mg Oral Daily   metoprolol tartrate  12.5 mg Oral BID   sodium chloride flush  3 mL Intravenous Q12H   tamsulosin  0.4 mg Oral QPC breakfast   Continuous Infusions:      LOS: 5 days   Barnetta Chapel  [facility]  05/18/2022, 1:44 PM

## 2022-05-18 NOTE — Plan of Care (Signed)
  Problem: Activity: Goal: Risk for activity intolerance will decrease Outcome: Progressing   

## 2022-05-18 NOTE — Progress Notes (Signed)
5pm: Patient complaining of severe pain to his private area, says he feel like he needs to pee but it is not coming out. Reminded patient that he has a foley catheter in place and it was okay for him to pee but he said he couldn't. Md Dartha Lodge made aware of the situation and asked nurse to check catheter placement by flushing it. RN bladder scan patient first and 0ml was noted in bladder. Two RN flushed cath with of sterile water,few strings of blood came through, MD aware. Tylenol given and patient said he felt a bit comfortable.

## 2022-05-19 ENCOUNTER — Inpatient Hospital Stay (HOSPITAL_COMMUNITY): Payer: Medicare Other

## 2022-05-19 DIAGNOSIS — N179 Acute kidney failure, unspecified: Secondary | ICD-10-CM | POA: Diagnosis not present

## 2022-05-19 LAB — BASIC METABOLIC PANEL
Anion gap: 11 (ref 5–15)
BUN: 20 mg/dL (ref 8–23)
CO2: 23 mmol/L (ref 22–32)
Calcium: 8.3 mg/dL — ABNORMAL LOW (ref 8.9–10.3)
Chloride: 103 mmol/L (ref 98–111)
Creatinine, Ser: 0.94 mg/dL (ref 0.61–1.24)
GFR, Estimated: >60 mL/min (ref >60)
Glucose, Bld: 213 mg/dL — ABNORMAL HIGH (ref 70–99)
Potassium: 3.6 mmol/L (ref 3.5–5.1)
Sodium: 137 mmol/L (ref 135–145)

## 2022-05-19 LAB — GLUCOSE, CAPILLARY
Glucose-Capillary: 108 mg/dL — ABNORMAL HIGH (ref 70–99)
Glucose-Capillary: 157 mg/dL — ABNORMAL HIGH (ref 70–99)
Glucose-Capillary: 160 mg/dL — ABNORMAL HIGH (ref 70–99)
Glucose-Capillary: 166 mg/dL — ABNORMAL HIGH (ref 70–99)
Glucose-Capillary: 200 mg/dL — ABNORMAL HIGH (ref 70–99)

## 2022-05-19 LAB — CBC
HCT: 40.5 % (ref 39.0–52.0)
Hemoglobin: 12.6 g/dL — ABNORMAL LOW (ref 13.0–17.0)
MCH: 29.4 pg (ref 26.0–34.0)
MCHC: 31.1 g/dL (ref 30.0–36.0)
MCV: 94.4 fL (ref 80.0–100.0)
Platelets: 157 10*3/uL (ref 150–400)
RBC: 4.29 MIL/uL (ref 4.22–5.81)
RDW: 13.2 % (ref 11.5–15.5)
WBC: 6.9 10*3/uL (ref 4.0–10.5)
nRBC: 0 % (ref 0.0–0.2)

## 2022-05-19 MED ORDER — SODIUM CHLORIDE 0.9 % IV SOLN
1.0000 g | INTRAVENOUS | Status: DC
  Administered 2022-05-19 – 2022-05-21 (×3): 1 g via INTRAVENOUS
  Filled 2022-05-19 (×3): qty 10

## 2022-05-19 NOTE — Progress Notes (Signed)
[facility] PROGRESS NOTE    Progress Note  James Dyer  ZOX:[id number] DOB: [birth year] DOA: 05/12/2022 PCP: James Found, MD     Brief Narrative:   James Dyer is an 77 y.o. male past medical history significant for paroxysmal atrial fibrillation on Eliquis, diabetes mellitus type 2, chronic bilateral foot drop, history of prostate cancer admitted for acute kidney injury and altered mental status after falling at home.  05/18/2022: Patient seen alongside patient's 2 sisters and nurse.  Bloody urine is noted.  Proceed with UA and CBC.  Further management depend on above.  Patient may require three-way Foley catheter. 05/19/2022: Patient seen.  Urine looks more bloody.  UA revealed many bacteria.  Will proceed with urine culture and IV antibiotics.  Urology team also consulted.  Input is highly appreciated.  Patient has history of prostate cancer status post seed radiation therapy.   Assessment/Plan:   Acute kidney injury Likely prerenal azotemia creatinine on admission was 1.4 with a baseline approximate 1.1, CK 400. After hydration CK improved along with renal function. Developed hypotension overnight and acute urinary retention Foley was placed started on IV fluids and albumin and his creatinine has returned to baseline.  Continue IV hydration and oral hydration. 05/18/2022: AKI has resolved.  BPH: Cont. Flomax p.o. follow-up with urology as an outpatient. Will need a voiding trial.  Acute metabolic encephalopathy: Likely secondary to hypoglycemia CT of the head showed no acute findings, MRI of the head showed no acute abnormalities. After correction of hypoglycemia his drowsiness resolved. 05/19/2022: Urine culture.  Start IV Rocephin.  Hypotension: Likely hypovolemic episode patient was pancultured with no signs and symptoms of infection. TSH was unremarkable no signs of bleeding. A.m. cortisol 6.8 cosyntropin 30 and 60 minutes was greater then 18 ug/dL, which  rules out adrenal insufficiency. Procalcitonin was unremarkable 05/19/2022: Hypotension has resolved.  Sinus tachycardia: Likely due to being off his metoprolol will start low-dose. Blood pressure is elevated today we will have to go home off ARB. 05/19/2022: Tachycardia has resolved.   Fall at home/chronic bilateral foot drop: Underwent extensive imaging with no significant fracture or traumatic injuries. Unclear falls called but his CBG was 30 on presentation. PT OT recommended acute inpatient rehab.  Diabetes mellitus type 2 with hypoglycemic episodes: Hypoglycemia likely contributing to encephalopathy and falls. Patient received D50 in the ED placed on D5 with normal saline. A1c is 5.9. Will go home off oral hypoglycemic agents. Cosyntropin test was unremarkable.  Fever: Of unclear source with a Tmax of 102.5 rectally in the ED. Infectious workup has been negative till date. Continue to monitor off antibiotics. 05/18/2022: Resolved.  Paroxysmal atrial fibrillation: Rate control on Lopressor. Eliquis has been held due to new thrombocytopenia and periorbital ecchymosis. The case was discussed with hematology no contraindication to anticoagulation as long as platelets are greater than 50,000. Hemoglobin has remained stable. Eliquis has been resumed. 05/19/2022: Will have low threshold to hold Eliquis.  Ascending aortic aneurysm: Will follow-up by PCP with a CTA as an outpatient. Will need of patient follow-up with vascular surgery.  Electrolyte imbalance hypokalemia/hypomagnesemia: Try to keep potassium greater than 4 magnesium greater than 2.  Memory issues: CT of brain showed no acute cranial abnormalities there is significant age-related atrophy probably due to chronic microvascular ischemic changes. Workup has been negative. Will need ambulatory referral as an outpatient.  Acute urinary retention Foley catheter inserted restart Flomax now that his blood pressure is  improved. Will need a voiding trial when patient  follows up with urology.    DVT prophylaxis: lovenox Family Communication:Daughter in-law, wife Status is: Inpatient Remains inpatient appropriate because: Acute kidney injury    Code Status:     Code Status Orders  (From admission, onward)           Start     Ordered   05/12/22 2334  Full code  Continuous       Question:  By:  Answer:  Consent: discussion documented in EHR   05/12/22 2335           Code Status History     Date Active Date Inactive Code Status Order ID Comments User Context   09/01/2013 2050 09/02/2013 1513 Full Code [id number]  Coletta Memos, MD Inpatient   09/09/2012 1347 09/10/2012 1216 Full Code [id number]  Jacki Cones, MD Inpatient         IV Access:   Peripheral IV   Procedures and diagnostic studies:   No results Dyer.   Medical Consultants:   None.   Subjective:    James Dyer no complaints  Objective:    Vitals:   05/19/22 0801 05/19/22 1138 05/19/22 1151 05/19/22 1503  BP: (!) 124/93 (!) 118/95  (!) 149/84  Pulse: 85 (!) 57 80 78  Resp: Temp: 98 F (36.7 C) 98 F (36.7 C)  98 F (36.7 C)  TempSrc: Oral Oral  Oral  SpO2: 90% 92% 95% 96%  Weight:      Height:       SpO2: 96 % O2 Flow Rate (L/min): 2 L/min   Intake/Output Summary (Last 24 hours) at 05/19/2022 1722 Last data filed at 05/19/2022 1700 Gross per 24 hour  Intake 120 ml  Output 1025 ml  Net -905 ml    Filed Weights   05/17/22 0422 05/18/22 0400 05/19/22 0443  Weight: 100.4 kg 100.4 kg 100.4 kg    Exam: General exam: In no acute distress.  Periorbital hematoma. Respiratory system: Good air movement and clear to auscultation. Cardiovascular system: S1 & S2 heard Gastrointestinal system: Abdomen is obese, soft and nontender.    Data Reviewed:    Labs: Basic Metabolic Panel: Recent Labs  Lab 05/13/22 0030 05/14/22 0058 05/15/22 0057 05/16/22 0051 05/17/22 0556  05/18/22 0037 05/19/22 0059  NA 134* 132* 133* 137 139 140 137  K 3.3* 3.8 3.7 3.4* 4.0 3.7 3.6  CL 102 99 100 109 105 105 103  CO2 21* GLUCOSE 78 115* 188* 113* 146* 172* 213*  BUN 28* 13 24* CREATININE 1.24 1.00 1.56* 1.08 0.91 0.89 0.94  CALCIUM 7.4* 7.9* 7.7* 7.5* 8.0* 8.4* 8.3*  MG 1.7 1.6* 2.5* 2.0  --   --   --     GFR Estimated Creatinine Clearance: 78.1 mL/min (by C-G formula based on SCr of 0.94 mg/dL). Liver Function Tests: Recent Labs  Lab 05/12/22 1820 05/14/22 0814 05/15/22 0057  AST 62* 66* 57*  ALT 41 35 33  ALKPHOS 49 39 38  BILITOT 0.8 0.7 0.7  PROT 6.4* 5.9* 5.4*  ALBUMIN 3.6 2.9* 2.9*    No results for input(s): "LIPASE", "AMYLASE" in the last 168 hours. Recent Labs  Lab 05/15/22 1021  AMMONIA 36*    Coagulation profile Recent Labs  Lab 05/12/22 1820 05/13/22 0030  INR 1.5* 1.4*    COVID-19 Labs  No results for input(s): "DDIMER", "FERRITIN", "LDH", "CRP" in the last 72  hours.   Lab Results  Component Value Date   SARSCOV2NAA NEGATIVE 05/12/2022   SARSCOV2NAA NEGATIVE 04/20/2019   SARSCOV2NAA Not Detected 12/19/2018   SARSCOV2NAA Not Detected 07/09/2018    CBC: Recent Labs  Lab 05/14/22 0058 05/15/22 0057 05/16/22 0051 05/18/22 1506 05/19/22 0059  WBC 4.0 3.3* 4.3 6.7 6.9  NEUTROABS 3.0 1.2* 3.4 4.2  --   HGB 14.8 14.5 13.4 13.2 12.6*  HCT 44.0 43.2 39.8 39.2 40.5  MCV 87.1 87.3 86.5 87.9 94.4  PLT 69* 72* 73* 131* 157    Cardiac Enzymes: Recent Labs  Lab 05/12/22 1820 05/13/22 0030  CKTOTAL 419* 381    BNP (last 3 results) No results for input(s): "PROBNP" in the last 8760 hours. CBG: Recent Labs  Lab 05/18/22 2029 05/19/22 0027 05/19/22 0451 05/19/22 1228 05/19/22 1640  GLUCAP 163* 200* 108* 166* 160*    D-Dimer: No results for input(s): "DDIMER" in the last 72 hours. Hgb A1c: No results for input(s): "HGBA1C" in the last 72 hours.  Lipid Profile: No results for  input(s): "CHOL", "HDL", "LDLCALC", "TRIG", "CHOLHDL", "LDLDIRECT" in the last 72 hours. Thyroid function studies: No results for input(s): "TSH", "T4TOTAL", "T3FREE", "THYROIDAB" in the last 72 hours.  Invalid input(s): "FREET3"  Anemia work up: No results for input(s): "VITAMINB12", "FOLATE", "FERRITIN", "TIBC", "IRON", "RETICCTPCT" in the last 72 hours.  Sepsis Labs: Recent Labs  Lab 05/12/22 1820 05/13/22 0030 05/15/22 0057 05/15/22 1021 05/16/22 0051 05/18/22 1506 05/19/22 0059  PROCALCITON  --   --   --  <0.10  --   --   --   WBC 4.0   < > 3.3*  --  4.3 6.7 6.9  LATICACIDVEN 0.9  --   --   --   --   --   --    < > = values in this interval not displayed.    Microbiology Recent Results (from the past 240 hour(s))  Resp panel by RT-PCR (RSV, Flu A&B, Covid) Anterior Nasal Swab     Status: None   Collection Time: 05/12/22  9:00 PM   Specimen: Anterior Nasal Swab  Result Value Ref [place] Status   SARS Coronavirus 2 by RT PCR NEGATIVE NEGATIVE Final   Influenza A by PCR NEGATIVE NEGATIVE Final   Influenza B by PCR NEGATIVE NEGATIVE Final    Comment: (NOTE) The Xpert Xpress SARS-CoV-2/FLU/RSV plus assay is intended as an aid in the diagnosis of influenza from Nasopharyngeal swab specimens and should not be used as a sole basis for treatment. Nasal washings and aspirates are unacceptable for Xpert Xpress SARS-CoV-2/FLU/RSV testing.  Fact Sheet for Patients: BloggerCourse.com  Fact Sheet for Healthcare Providers: SeriousBroker.it  This test is not yet approved or cleared by the [place] FDA and has been authorized for detection and/or diagnosis of SARS-CoV-2 by FDA under an Emergency Use Authorization (EUA). This EUA will remain in effect (meaning this test can be used) for the duration of the COVID-19 declaration under Section 564(b)(1) of the Act, 21 U.S.C. section 360bbb-3(b)(1), unless the authorization is  terminated or revoked.     Resp Syncytial Virus by PCR NEGATIVE NEGATIVE Final    Comment: (NOTE) Fact Sheet for Patients: BloggerCourse.com  Fact Sheet for Healthcare Providers: SeriousBroker.it  This test is not yet approved or cleared by the [place] FDA and has been authorized for detection and/or diagnosis of SARS-CoV-2 by FDA under an Emergency Use Authorization (EUA). This EUA will remain in effect (meaning this test can be  used) for the duration of the COVID-19 declaration under Section 564(b)(1) of the Act, 21 U.S.C. section 360bbb-3(b)(1), unless the authorization is terminated or revoked.  Performed at [facility], [address], [place], [place] [address]   Blood culture (routine x 2)     Status: None   Collection Time: 05/12/22 11:32 PM   Specimen: BLOOD  Result Value Ref [place] Status   Specimen Description BLOOD SITE NOT SPECIFIED  Final   Special Requests   Final    BOTTLES DRAWN AEROBIC AND ANAEROBIC Blood Culture results may not be optimal due to an excessive volume of blood received in culture bottles   Culture   Final    NO GROWTH 5 DAYS Performed at [facility], [address], [place], [place] [address]    Report Status 05/18/2022 FINAL  Final  Blood culture (routine x 2)     Status: None   Collection Time: 05/12/22 11:32 PM   Specimen: BLOOD  Result Value Ref [place] Status   Specimen Description BLOOD SITE NOT SPECIFIED  Final   Special Requests   Final    BOTTLES DRAWN AEROBIC AND ANAEROBIC Blood Culture results may not be optimal due to an excessive volume of blood received in culture bottles   Culture   Final    NO GROWTH 5 DAYS Performed at [facility], [address], [place], [place] [address]    Report Status 05/18/2022 FINAL  Final     Medications:    Chlorhexidine Gluconate Cloth  6 each Topical Daily   fesoterodine  4 mg Oral Daily   metoprolol tartrate   12.5 mg Oral BID   sodium chloride flush  3 mL Intravenous Q12H   tamsulosin  0.4 mg Oral QPC breakfast   Continuous Infusions:  cefTRIAXone (ROCEPHIN)  IV         LOS: 6 days   Time spent: 35 minutes.  Barnetta Chapel  [facility]  05/19/2022, 5:22 PM

## 2022-05-19 NOTE — Progress Notes (Signed)
Mobility Specialist Progress Note    05/19/22 1103  Mobility  Activity Ambulated with assistance in hallway  Level of Assistance +2 (takes two people) (chair follow)  Assistive Device Front wheel walker  Distance Ambulated (ft) 350 ft  Activity Response Tolerated well  Mobility Referral Yes  $Mobility charge 1 Mobility   Pt received in bed and agreeable. MinA+1 to stand. C/o BUE fatigue during. Took a few short standing rest breaks to take a few breaths. Returned to chair with call bell in reach.   James Dyer Mobility Specialist  Please Neurosurgeon or Rehab Office at [phone]

## 2022-05-20 DIAGNOSIS — N179 Acute kidney failure, unspecified: Secondary | ICD-10-CM | POA: Diagnosis not present

## 2022-05-20 LAB — GLUCOSE, CAPILLARY
Glucose-Capillary: 112 mg/dL — ABNORMAL HIGH (ref 70–99)
Glucose-Capillary: 137 mg/dL — ABNORMAL HIGH (ref 70–99)
Glucose-Capillary: 156 mg/dL — ABNORMAL HIGH (ref 70–99)
Glucose-Capillary: 210 mg/dL — ABNORMAL HIGH (ref 70–99)

## 2022-05-20 LAB — URINE CULTURE

## 2022-05-20 MED ORDER — HALOPERIDOL LACTATE 5 MG/ML IJ SOLN
1.0000 mg | INTRAMUSCULAR | Status: AC
  Administered 2022-05-20: 1 mg via INTRAVENOUS
  Filled 2022-05-20: qty 1

## 2022-05-20 MED ORDER — APIXABAN 2.5 MG PO TABS
2.5000 mg | ORAL_TABLET | Freq: Two times a day (BID) | ORAL | Status: DC
  Administered 2022-05-20 – 2022-05-22 (×4): 2.5 mg via ORAL
  Filled 2022-05-20 (×4): qty 1

## 2022-05-20 NOTE — Plan of Care (Signed)
  Problem: Clinical Measurements: Goal: Respiratory complications will improve Outcome: Progressing   Problem: Clinical Measurements: Goal: Cardiovascular complication will be avoided Outcome: Progressing   

## 2022-05-20 NOTE — Progress Notes (Signed)
[facility] PROGRESS NOTE    Progress Note  James Dyer  WUJ:[id number] DOB: [birth year] DOA: 05/12/2022 PCP: [name], MD     Brief Narrative:   James Dyer is an 77 y.o. male past medical history significant for paroxysmal atrial fibrillation on Eliquis, diabetes mellitus type 2, chronic bilateral foot drop, history of prostate cancer admitted for acute kidney injury and altered mental status after falling at home.  05/18/2022: Patient seen alongside patient's 2 sisters and nurse.  Bloody urine is noted.  Proceed with UA and CBC.  Further management depend on above.  Patient may require three-way Foley catheter. 05/19/2022: Patient seen.  Urine looks more bloody.  UA revealed many bacteria.  Will proceed with urine culture and IV antibiotics.  Urology team also consulted.  Input is highly appreciated.  Patient has history of prostate cancer status post seed radiation therapy. 05/20/2022: Patient seen alongside patient's sister.  Patient continues to improve slowly.  Urine culture is growing E. coli.  Continue IV Rocephin.  Follow final urine culture.  Hematuria has improved significantly.  Will restart Eliquis.  Likely discharge in the next 24 to 48 hours.   Assessment/Plan:   Acute kidney injury Likely prerenal azotemia creatinine on admission was 1.4 with a baseline approximate 1.1, CK 400. After hydration CK improved along with renal function. Developed hypotension overnight and acute urinary retention Foley was placed started on IV fluids and albumin and his creatinine has returned to baseline.  Continue IV hydration and oral hydration. 05/18/2022: AKI has resolved.  BPH: Cont. Flomax p.o. follow-up with urology as an outpatient. Will need a voiding trial.  Acute metabolic encephalopathy: Likely secondary to hypoglycemia CT of the head showed no acute findings, MRI of the head showed no acute abnormalities. After correction of hypoglycemia his drowsiness  resolved. 05/19/2022: Urine culture.  Start IV Rocephin. 05/20/2022: Acute encephalopathy is likely multifactorial.  Patient is growing E. coli.  Continue IV Rocephin for now.  Hypotension: Likely hypovolemic episode patient was pancultured with no signs and symptoms of infection. TSH was unremarkable no signs of bleeding. A.m. cortisol 6.8 cosyntropin 30 and 60 minutes was greater then 18 ug/dL, which rules out adrenal insufficiency. Procalcitonin was unremarkable 05/19/2022: Hypotension has resolved.  Sinus tachycardia: Likely due to being off his metoprolol will start low-dose. Blood pressure is elevated today we will have to go home off ARB. 05/19/2022: Tachycardia has resolved.   Fall at home/chronic bilateral foot drop: Underwent extensive imaging with no significant fracture or traumatic injuries. Unclear falls called but his CBG was 30 on presentation. PT OT recommended acute inpatient rehab.  Diabetes mellitus type 2 with hypoglycemic episodes: Hypoglycemia likely contributing to encephalopathy and falls. Patient received D50 in the ED placed on D5 with normal saline. A1c is 5.9. Will go home off oral hypoglycemic agents. Cosyntropin test was unremarkable.  Fever: Of unclear source with a Tmax of 102.5 rectally in the ED. Infectious workup has been negative till date. Continue to monitor off antibiotics. 05/18/2022: Resolved.  Paroxysmal atrial fibrillation: Rate control on Lopressor. Eliquis has been held due to new thrombocytopenia and periorbital ecchymosis. The case was discussed with hematology no contraindication to anticoagulation as long as platelets are greater than 50,000. Hemoglobin has remained stable. Eliquis has been resumed. 05/19/2022: Will have low threshold to hold Eliquis.  Ascending aortic aneurysm: Will follow-up by PCP with a CTA as an outpatient. Will need of patient follow-up with vascular surgery.  Electrolyte imbalance  hypokalemia/hypomagnesemia: Try to keep  potassium greater than 4 magnesium greater than 2.  Memory issues: CT of brain showed no acute cranial abnormalities there is significant age-related atrophy probably due to chronic microvascular ischemic changes. Workup has been negative. Will need ambulatory referral as an outpatient.  Acute urinary retention Foley catheter inserted restart Flomax now that his blood pressure is improved. Will need a voiding trial when patient follows up with urology.    DVT prophylaxis: lovenox Family Communication:Daughter in-law, wife Status is: Inpatient Remains inpatient appropriate because: Acute kidney injury    Code Status:     Code Status Orders  (From admission, onward)           Start     Ordered   05/12/22 2334  Full code  Continuous       Question:  By:  Answer:  Consent: discussion documented in EHR   05/12/22 2335           Code Status History     Date Active Date Inactive Code Status Order ID Comments User Context   09/01/2013 2050 09/02/2013 1513 Full Code [id number]  Coletta Memos, MD Inpatient   09/09/2012 1347 09/10/2012 1216 Full Code [id number]  Jacki Cones, MD Inpatient         IV Access:   Peripheral IV   Procedures and diagnostic studies:   US RENAL  Result Date: 05/19/2022 CLINICAL DATA:  Hematuria. EXAM: RENAL / URINARY TRACT ULTRASOUND COMPLETE COMPARISON:  CT dated 05/12/2022. FINDINGS: Right Kidney: Renal measurements: 12.5 x 6.0 x 5.1 cm = volume: 202 mL. Normal echogenicity. No hydronephrosis or shadowing stone. A 1.7 cm parapelvic cyst. Additional smaller cyst in the inferior pole of the right kidney. Left Kidney: Renal measurements: 12.2 x 6.3 x 5.9 cm = volume: 236 mL. Normal echogenicity. No hydronephrosis or shadowing stone. A 4.7 cm inferior pole cyst. Bladder: The bladder is not visualized. Other: None. IMPRESSION: 1. No hydronephrosis or shadowing stone. 2. Bilateral renal cysts. Electronically  Signed   By: Elgie Collard M.D.   On: 05/19/2022 19:16     Medical Consultants:   None.   Subjective:    [name] no complaints  Objective:    Vitals:   05/20/22 0900 05/20/22 1147 05/20/22 1148 05/20/22 1528  BP: (!) 129/97 96/65 99/73  (!) 134/92  Pulse: 77  90 71  Resp: 16   20  Temp: 97.8 F (36.6 C)  (!) 97.5 F (36.4 C) (!) 97.4 F (36.3 C)  TempSrc: Oral  Oral Oral  SpO2: 96%  97% 97%  Weight:      Height:       SpO2: 97 % O2 Flow Rate (L/min): 2 L/min   Intake/Output Summary (Last 24 hours) at 05/20/2022 1738 Last data filed at 05/20/2022 1452 Gross per 24 hour  Intake 340.07 ml  Output 900 ml  Net -559.93 ml    Filed Weights   05/18/22 0400 05/19/22 0443 05/20/22 0009  Weight: 100.4 kg 100.4 kg 104.3 kg    Exam: General exam: In no acute distress.  Periorbital hematoma. Respiratory system: Good air movement and clear to auscultation. Cardiovascular system: S1 & S2 heard Gastrointestinal system: Abdomen is obese, soft and nontender.    Data Reviewed:    Labs: Basic Metabolic Panel: Recent Labs  Lab 05/14/22 0058 05/15/22 0057 05/16/22 0051 05/17/22 0556 05/18/22 0037 05/19/22 0059  NA 132* 133* 137 139 140 137  K 3.8 3.7 3.4* 4.0 3.7 3.6  CL 99 100 109 105 105 103  CO2 GLUCOSE 115* 188* 113* 146* 172* 213*  BUN 13 24* CREATININE 1.00 1.56* 1.08 0.91 0.89 0.94  CALCIUM 7.9* 7.7* 7.5* 8.0* 8.4* 8.3*  MG 1.6* 2.5* 2.0  --   --   --     GFR Estimated Creatinine Clearance: 79.5 mL/min (by C-G formula based on SCr of 0.94 mg/dL). Liver Function Tests: Recent Labs  Lab 05/14/22 0814 05/15/22 0057  AST 66* 57*  ALT 35 33  ALKPHOS 39 38  BILITOT 0.7 0.7  PROT 5.9* 5.4*  ALBUMIN 2.9* 2.9*    No results for input(s): "LIPASE", "AMYLASE" in the last 168 hours. Recent Labs  Lab 05/15/22 1021  AMMONIA 36*    Coagulation profile No results for input(s): "INR", "PROTIME" in the last 168  hours.  COVID-19 Labs  No results for input(s): "DDIMER", "FERRITIN", "LDH", "CRP" in the last 72 hours.   Lab Results  Component Value Date   SARSCOV2NAA NEGATIVE 05/12/2022   SARSCOV2NAA NEGATIVE 04/20/2019   SARSCOV2NAA Not Detected 12/19/2018   SARSCOV2NAA Not Detected 07/09/2018    CBC: Recent Labs  Lab 05/14/22 0058 05/15/22 0057 05/16/22 0051 05/18/22 1506 05/19/22 0059  WBC 4.0 3.3* 4.3 6.7 6.9  NEUTROABS 3.0 1.2* 3.4 4.2  --   HGB 14.8 14.5 13.4 13.2 12.6*  HCT 44.0 43.2 39.8 39.2 40.5  MCV 87.1 87.3 86.5 87.9 94.4  PLT 69* 72* 73* 131* 157    Cardiac Enzymes: No results for input(s): "CKTOTAL", "CKMB", "CKMBINDEX", "TROPONINI" in the last 168 hours.  BNP (last 3 results) No results for input(s): "PROBNP" in the last 8760 hours. CBG: Recent Labs  Lab 05/19/22 1640 05/19/22 2007 05/20/22 0008 05/20/22 0558 05/20/22 1147  GLUCAP 160* 157* 137* 112* 156*    D-Dimer: No results for input(s): "DDIMER" in the last 72 hours. Hgb A1c: No results for input(s): "HGBA1C" in the last 72 hours.  Lipid Profile: No results for input(s): "CHOL", "HDL", "LDLCALC", "TRIG", "CHOLHDL", "LDLDIRECT" in the last 72 hours. Thyroid function studies: No results for input(s): "TSH", "T4TOTAL", "T3FREE", "THYROIDAB" in the last 72 hours.  Invalid input(s): "FREET3"  Anemia work up: No results for input(s): "VITAMINB12", "FOLATE", "FERRITIN", "TIBC", "IRON", "RETICCTPCT" in the last 72 hours.  Sepsis Labs: Recent Labs  Lab 05/15/22 0057 05/15/22 1021 05/16/22 0051 05/18/22 1506 05/19/22 0059  PROCALCITON  --  <0.10  --   --   --   WBC 3.3*  --  4.3 6.7 6.9    Microbiology Recent Results (from the past 240 hour(s))  Resp panel by RT-PCR (RSV, Flu A&B, Covid) Anterior Nasal Swab     Status: None   Collection Time: 05/12/22  9:00 PM   Specimen: Anterior Nasal Swab  Result Value Ref Range Status   SARS Coronavirus 2 by RT PCR NEGATIVE NEGATIVE Final    Influenza A by PCR NEGATIVE NEGATIVE Final   Influenza B by PCR NEGATIVE NEGATIVE Final    Comment: (NOTE) The Xpert Xpress SARS-CoV-2/FLU/RSV plus assay is intended as an aid in the diagnosis of influenza from Nasopharyngeal swab specimens and should not be used as a sole basis for treatment. Nasal washings and aspirates are unacceptable for Xpert Xpress SARS-CoV-2/FLU/RSV testing.  Fact Sheet for Patients: BloggerCourse.com  Fact Sheet for Healthcare Providers: SeriousBroker.it  This test is not yet approved or cleared by the [place] FDA and has been authorized for detection and/or diagnosis of SARS-CoV-2 by FDA under  an Emergency Use Authorization (EUA). This EUA will remain in effect (meaning this test can be used) for the duration of the COVID-19 declaration under Section 564(b)(1) of the Act, 21 U.S.C. section 360bbb-3(b)(1), unless the authorization is terminated or revoked.     Resp Syncytial Virus by PCR NEGATIVE NEGATIVE Final    Comment: (NOTE) Fact Sheet for Patients: BloggerCourse.com  Fact Sheet for Healthcare Providers: SeriousBroker.it  This test is not yet approved or cleared by the [place] FDA and has been authorized for detection and/or diagnosis of SARS-CoV-2 by FDA under an Emergency Use Authorization (EUA). This EUA will remain in effect (meaning this test can be used) for the duration of the COVID-19 declaration under Section 564(b)(1) of the Act, 21 U.S.C. section 360bbb-3(b)(1), unless the authorization is terminated or revoked.  Performed at [facility], [address], [place], [place] [address]   Blood culture (routine x 2)     Status: None   Collection Time: 05/12/22 11:32 PM   Specimen: BLOOD  Result Value Ref Range Status   Specimen Description BLOOD SITE NOT SPECIFIED  Final   Special Requests   Final    BOTTLES DRAWN  AEROBIC AND ANAEROBIC Blood Culture results may not be optimal due to an excessive volume of blood received in culture bottles   Culture   Final    NO GROWTH 5 DAYS Performed at [facility], [address], [place], [place] [address]    Report Status 05/18/2022 FINAL  Final  Blood culture (routine x 2)     Status: None   Collection Time: 05/12/22 11:32 PM   Specimen: BLOOD  Result Value Ref Range Status   Specimen Description BLOOD SITE NOT SPECIFIED  Final   Special Requests   Final    BOTTLES DRAWN AEROBIC AND ANAEROBIC Blood Culture results may not be optimal due to an excessive volume of blood received in culture bottles   Culture   Final    NO GROWTH 5 DAYS Performed at [facility], [address] [place], [place] [address]    Report Status 05/18/2022 FINAL  Final  Urine Culture (for pregnant, neutropenic or urologic patients or patients with an indwelling urinary catheter)     Status: Abnormal (Preliminary result)   Collection Time: 05/18/22  3:11 PM   Specimen: Urine, Catheterized  Result Value Ref Range Status   Specimen Description URINE, CATHETERIZED  Final   Special Requests NONE  Final   Culture (A)  Final    >=100,000 COLONIES/mL ESCHERICHIA COLI SUSCEPTIBILITIES TO FOLLOW Performed at [facility], [address] [place], [place] [address]    Report Status PENDING  Incomplete     Medications:    apixaban  2.5 mg Oral BID   Chlorhexidine Gluconate Cloth  6 each Topical Daily   fesoterodine  4 mg Oral Daily   metoprolol tartrate  12.5 mg Oral BID   sodium chloride flush  3 mL Intravenous Q12H   tamsulosin  0.4 mg Oral QPC breakfast   Continuous Infusions:  cefTRIAXone (ROCEPHIN)  IV 1 g (05/20/22 1710)       LOS: 7 days   Time spent: 35 minutes.  Barnetta Chapel  [facility]  05/20/2022, 5:38 PM

## 2022-05-21 DIAGNOSIS — N179 Acute kidney failure, unspecified: Secondary | ICD-10-CM | POA: Diagnosis not present

## 2022-05-21 LAB — SARS CORONAVIRUS 2 BY RT PCR: SARS Coronavirus 2 by RT PCR: NEGATIVE

## 2022-05-21 LAB — GLUCOSE, CAPILLARY
Glucose-Capillary: 124 mg/dL — ABNORMAL HIGH (ref 70–99)
Glucose-Capillary: 149 mg/dL — ABNORMAL HIGH (ref 70–99)
Glucose-Capillary: 152 mg/dL — ABNORMAL HIGH (ref 70–99)
Glucose-Capillary: 152 mg/dL — ABNORMAL HIGH (ref 70–99)

## 2022-05-21 LAB — URINE CULTURE: Culture: >=100000 — AB

## 2022-05-21 MED ORDER — SODIUM CHLORIDE 0.9 % IV SOLN
INTRAVENOUS | Status: DC | PRN
  Administered 2022-05-21: 250 mL via INTRAVENOUS

## 2022-05-21 NOTE — Progress Notes (Signed)
Physical Therapy Treatment Patient Details Name: James Dyer MRN: [id number] DOB: [birth year] Today's Date: 05/21/2022   History of Present Illness Pt is a 77 y.o. male who presented 05/12/22 s/p fall with AMS. Imaging showed no acute intracranial abnormality, no acute displaced facial fracture, no acute displaced fracture or traumatic listhesis of the C-spine. CT chest/abdomen/pelvis with contrast negative for acute fracture or solid organ injury. Admitted with AKI and hypoglycemia. PMH: PAF on Eliquis, T2DM, HTN, HLD, chronic bilateral foot drop, history of prostate cancer    PT Comments    Pt tolerated treatment well today. Pt was able to progress ambulation in hallway at supervision level with a RW and chair follow plus navigate 2 stairs with +2 Min A. No change in DC/DME recs at this time. Pt family anticipates DC to SNF in next 2 days. PT will continue to follow.  Recommendations for follow up therapy are one component of a multi-disciplinary discharge planning process, led by the attending physician.  Recommendations may be updated based on patient status, additional functional criteria and insurance authorization.  Follow Up Recommendations  Can patient physically be transported by private vehicle: No    Assistance Recommended at Discharge Frequent or constant Supervision/Assistance  Patient can return home with the following Two people to help with walking and/or transfers;A lot of help with bathing/dressing/bathroom;Assistance with cooking/housework;Direct supervision/assist for financial management;Direct supervision/assist for medications management;Assist for transportation;Help with stairs or ramp for entrance   Equipment Recommendations  Other (comment) (Per accepting facility)    Recommendations for Other Services       Precautions / Restrictions Precautions Precautions: Fall;Other (comment) Precaution Comments: watch vitals; bil foot drop with braces at  baseline Required Braces or Orthoses: Other Brace Other Brace: BLE AFOs for OOB mobility Restrictions Weight Bearing Restrictions: No     Mobility  Bed Mobility Overal bed mobility: Modified Independent Bed Mobility: Supine to Sit     Supine to sit: Modified independent (Device/Increase time)     General bed mobility comments: Pt left seated in recliner    Transfers Overall transfer level: Needs assistance Equipment used: Rolling walker (2 wheels) Transfers: Sit to/from Stand Sit to Stand: Supervision           General transfer comment: increased time needed to stand into upright position    Ambulation/Gait Ambulation/Gait assistance: Supervision, +2 safety/equipment (chair follow) Gait Distance (Feet): 600 Feet Assistive device: Rolling walker (2 wheels) Gait Pattern/deviations: Decreased stride length, Step-through pattern, Trunk flexed Gait velocity: decreased     General Gait Details: Cues for upright posture. no LOB noted.   Stairs Stairs: Yes Stairs assistance: +2 physical assistance, Min assist Stair Management: Two rails, Step to pattern, Forwards Number of Stairs: 2 General stair comments: no LOB noted.   Wheelchair Mobility    Modified Rankin (Stroke Patients Only)       Balance Overall balance assessment: Needs assistance Sitting-balance support: Single extremity supported, Bilateral upper extremity supported, Feet supported Sitting balance-Leahy Scale: Good     Standing balance support: Bilateral upper extremity supported, Reliant on assistive device for balance Standing balance-Leahy Scale: Fair Standing balance comment: completed STS without RW                            Cognition Arousal/Alertness: Awake/alert Behavior During Therapy: WFL for tasks assessed/performed Overall Cognitive Status: Impaired/Different from baseline Area of Impairment: Safety/judgement, Following commands, Problem solving  Following Commands: Follows one step commands with increased time Safety/Judgement: Decreased awareness of deficits   Problem Solving: Slow processing General Comments: Pt more verbal and engaged in today's therapy session, Per family his cognition still is not at baseline        Exercises      General Comments General comments (skin integrity, edema, etc.): VSS on RA      Pertinent Vitals/Pain Pain Assessment Pain Assessment: No/denies pain    Home Living                          Prior Function            PT Goals (current goals can now be found in the care plan section) Progress towards PT goals: Progressing toward goals    Frequency    Min 1X/week      PT Plan Current plan remains appropriate    Co-evaluation PT/OT/SLP Co-Evaluation/Treatment: Yes Reason for Co-Treatment: Complexity of the patient's impairments (multi-system involvement);For patient/therapist safety PT goals addressed during session: Mobility/safety with mobility;Balance OT goals addressed during session: ADL's and self-care;Proper use of Adaptive equipment and DME      AM-PAC PT "6 Clicks" Mobility   Outcome Measure  Help needed turning from your back to your side while in a flat bed without using bedrails?: None Help needed moving from lying on your back to sitting on the side of a flat bed without using bedrails?: None Help needed moving to and from a bed to a chair (including a wheelchair)?: A Little Help needed standing up from a chair using your arms (e.g., wheelchair or bedside chair)?: A Little Help needed to walk in hospital room?: A Little Help needed climbing 3-5 steps with a railing? : A Lot 6 Click Score: 19    End of Session Equipment Utilized During Treatment: Gait belt Activity Tolerance: Patient tolerated treatment well Patient left: in chair;with call bell/phone within reach;with family/visitor present Nurse Communication: Mobility status PT  Visit Diagnosis: Unsteadiness on feet (R26.81);Muscle weakness (generalized) (M62.81);History of falling (Z91.81);Repeated falls (R29.6);Difficulty in walking, not elsewhere classified (R26.2)     Time: 1610-9604 PT Time Calculation (min) (ACUTE ONLY): 31 min  Charges:  $Gait Training: 8-22 mins                     Shela Nevin, PT, DPT Acute Rehab Services [phone]    Gladys Damme 05/21/2022, 12:31 PM

## 2022-05-21 NOTE — Plan of Care (Signed)

## 2022-05-21 NOTE — Progress Notes (Signed)
Occupational Therapy Treatment Patient Details Name: James Dyer MRN: [id number] DOB: [birth year] Today's Date: 05/21/2022   History of present illness Pt is a 77 y.o. male who presented 05/12/22 s/p fall with AMS. Imaging showed no acute intracranial abnormality, no acute displaced facial fracture, no acute displaced fracture or traumatic listhesis of the C-spine. CT chest/abdomen/pelvis with contrast negative for acute fracture or solid organ injury. Admitted with AKI and hypoglycemia. PMH: PAF on Eliquis, T2DM, HTN, HLD, chronic bilateral foot drop, history of prostate cancer   OT comments  Pt continuing to demonstrate progression during therapy sessions, Pt currently able to complete lower body dressing with supervision while seated EOB but displays slow processing. Pt ambulating in hall at supervision level with RW but is still not back to functional baseline with mild cognitive impairments still noted. Increased balance impairments noted with stairs. Pt is more conversive this session and is progressing well. OT to continue to progress patient as able. OT continuing to recommend CIR as this will help patient reach Mod I level to safely return home sooner. If patient is not approved for CIR, would be appropriate for SNF for a period of time to help patient safely return to Mod I level.    Recommendations for follow up therapy are one component of a multi-disciplinary discharge planning process, led by the attending physician.  Recommendations may be updated based on patient status, additional functional criteria and insurance authorization.    Assistance Recommended at Discharge Frequent or constant Supervision/Assistance  Patient can return home with the following  Assist for transportation;Direct supervision/assist for financial management;Direct supervision/assist for medications management;Help with stairs or ramp for entrance;A little help with walking and/or transfers;Assistance with  cooking/housework   Equipment Recommendations  Other (comment) (Defer to next level of care)    Recommendations for Other Services      Precautions / Restrictions Precautions Precautions: Fall;Other (comment) Precaution Comments: watch vitals; bil foot drop with braces at baseline Required Braces or Orthoses: Other Brace Other Brace: BLE AFOs for OOB mobility Restrictions Weight Bearing Restrictions: No       Mobility Bed Mobility Overal bed mobility: Needs Assistance Bed Mobility: Supine to Sit     Supine to sit: Modified independent (Device/Increase time)     General bed mobility comments: Pt left seated in recliner    Transfers Overall transfer level: Needs assistance Equipment used: Rolling walker (2 wheels) Transfers: Sit to/from Stand Sit to Stand: Min guard           General transfer comment: increased time needed to stand into upright position     Balance Overall balance assessment: Needs assistance Sitting-balance support: Single extremity supported, Bilateral upper extremity supported, Feet supported Sitting balance-Leahy Scale: Good     Standing balance support: Bilateral upper extremity supported, Reliant on assistive device for balance   Standing balance comment: completed STS without RW                           ADL either performed or assessed with clinical judgement   ADL Overall ADL's : Needs assistance/impaired                     Lower Body Dressing: Supervision/safety;Sitting/lateral leans Lower Body Dressing Details (indicate cue type and reason): Pt donned bilat socks and AFOs while sitting EOB             Functional mobility during ADLs: Supervision/safety;Rolling walker (2 wheels) General ADL  Comments: Pt needing 2+ Min A to navigate stairs    Extremity/Trunk Assessment              Vision       Perception     Praxis      Cognition Arousal/Alertness: Awake/alert Behavior During Therapy:  WFL for tasks assessed/performed Overall Cognitive Status: Impaired/Different from baseline Area of Impairment: Safety/judgement, Following commands, Problem solving                       Following Commands: Follows one step commands with increased time Safety/Judgement: Decreased awareness of deficits   Problem Solving: Slow processing General Comments: Pt more verbal and engaged in today's therapy session, Per family his cognition still is not at baseline        Exercises      Shoulder Instructions       General Comments VSS on RA    Pertinent Vitals/ Pain       Pain Assessment Pain Assessment: No/denies pain  Home Living                                          Prior Functioning/Environment              Frequency  Min 3X/week        Progress Toward Goals  OT Goals(current goals can now be found in the care plan section)  Progress towards OT goals: Progressing toward goals  Acute Rehab OT Goals Patient Stated Goal: none stated OT Goal Formulation: With patient/family Time For Goal Achievement: 05/28/22 Potential to Achieve Goals: Good  Plan Discharge plan remains appropriate    Co-evaluation    PT/OT/SLP Co-Evaluation/Treatment: Yes   PT goals addressed during session: Mobility/safety with mobility;Balance OT goals addressed during session: ADL's and self-care;Proper use of Adaptive equipment and DME      AM-PAC OT "6 Clicks" Daily Activity     Outcome Measure   Help from another person eating meals?: None Help from another person taking care of personal grooming?: A Little Help from another person toileting, which includes using toliet, bedpan, or urinal?: A Little Help from another person bathing (including washing, rinsing, drying)?: A Little Help from another person to put on and taking off regular upper body clothing?: None Help from another person to put on and taking off regular lower body clothing?: A  Little 6 Click Score: 20    End of Session Equipment Utilized During Treatment: Gait belt;Rolling walker (2 wheels)  OT Visit Diagnosis: Unsteadiness on feet (R26.81);Other abnormalities of gait and mobility (R26.89);History of falling (Z91.81);Muscle weakness (generalized) (M62.81)   Activity Tolerance Patient tolerated treatment well   Patient Left in chair;with call bell/phone within reach   Nurse Communication Mobility status        Time: 1610-9604 OT Time Calculation (min): 30 min  Charges: OT General Charges $OT Visit: 1 Visit OT Treatments $Self Care/Home Management : 8-22 mins  05/21/2022  AB, OTR/L  Acute Rehabilitation Services  Office: [phone]   Tristan Schroeder 05/21/2022, 11:51 AM

## 2022-05-21 NOTE — Progress Notes (Signed)
[facility] PROGRESS NOTE    Progress Note  James Dyer  WUJ:[id number] DOB: 1946/01/10 DOA: 05/12/2022 PCP: [name], MD     Brief Narrative:   James Dyer is an 77 y.o. male past medical history significant for paroxysmal atrial fibrillation on Eliquis, diabetes mellitus type 2, chronic bilateral foot drop, history of prostate cancer admitted for acute kidney injury and altered mental status after falling at home.   Assessment/Plan:   Acute kidney injury Likely prerenal azotemia creatinine on admission was 1.4 with a baseline approximate 1.1, CK 400. After hydration CK improved along with renal function. Developed hypotension overnight and acute urinary retention Foley was placed started on IV fluids and albumin and his creatinine has returned to baseline.  Continue IV hydration and oral hydration. Now resolved.  BPH: Cont. Flomax p.o. follow-up with urology as an outpatient. Blurry urine UA was done and urine culture showed E. coli he was started empirically on IV Rocephin.  Hematuria has resolved was restarted on Eliquis.  Possible UTI: Was started on IV Rocephin, urine culture grew E. coli.  Completed 7-day course.  Acute metabolic encephalopathy: Likely secondary to hypoglycemia CT of the head showed no acute findings, MRI of the head showed no acute abnormalities. After correction of hypoglycemia his drowsiness resolved.  Hypotension: Likely hypovolemic episode patient was pancultured with no signs and symptoms of infection. TSH was unremarkable no signs of bleeding. A.m. cortisol 6.8 cosyntropin 30 and 60 minutes was greater then 18 ug/dL, which rules out adrenal insufficiency. Procalcitonin was unremarkable  Sinus tachycardia: Likely due to being off his metoprolol will start low-dose. Blood pressure is elevated today we will have to go home off ARB.  Fall at home/chronic bilateral foot drop: Underwent extensive imaging with no significant  fracture or traumatic injuries. Unclear falls called but his CBG was 30 on presentation. PT OT recommended acute inpatient rehab.  Diabetes mellitus type 2 with hypoglycemic episodes: Hypoglycemia likely contributing to encephalopathy and falls. Patient received D50 in the ED placed on D5 with normal saline. A1c is 5.9. Will go home off oral hypoglycemic agents. Cosyntropin test was unremarkable.  Fever: Of unclear source with a Tmax of 102.5 rectally in the ED. Infectious workup has been negative till date. Continue to monitor off antibiotics.  Paroxysmal atrial fibrillation: Rate control on Lopressor. Eliquis has been held due to new thrombocytopenia and periorbital ecchymosis. The case was discussed with hematology no contraindication to anticoagulation as long as platelets are greater than 50,000. Hemoglobin has remained stable. Eliquis has been resumed.  Ascending aortic aneurysm: Will follow-up by PCP with a CTA as an outpatient. Will need of patient follow-up with vascular surgery.  Electrolyte imbalance hypokalemia/hypomagnesemia: Try to keep potassium greater than 4 magnesium greater than 2.  Memory issues: CT of brain showed no acute cranial abnormalities there is significant age-related atrophy probably due to chronic microvascular ischemic changes. Workup has been negative. Will need ambulatory referral as an outpatient.  Acute urinary retention Foley in place currently on Flomax and follow-up with urology as an outpatient. Voiding trial.   DVT prophylaxis: lovenox Family Communication:Daughter in-law, wife Status is: Inpatient Remains inpatient appropriate because: Acute kidney injury    Code Status:     Code Status Orders  (From admission, onward)           Start     Ordered   05/12/22 2334  Full code  Continuous       Question:  By:  Answer:  Consent:  discussion documented in EHR   05/12/22 2335           Code Status History     Date  Active Date Inactive Code Status Order ID Comments User Context   09/01/2013 2050 09/02/2013 1513 Full Code [id number]  Coletta Memos, MD Inpatient   09/09/2012 1347 09/10/2012 1216 Full Code [id number]  Jacki Cones, MD Inpatient         IV Access:   Peripheral IV   Procedures and diagnostic studies:   US RENAL  Result Date: 05/19/2022 CLINICAL DATA:  Hematuria. EXAM: RENAL / URINARY TRACT ULTRASOUND COMPLETE COMPARISON:  CT dated 05/12/2022. FINDINGS: Right Kidney: Renal measurements: 12.5 x 6.0 x 5.1 cm = volume: 202 mL. Normal echogenicity. No hydronephrosis or shadowing stone. A 1.7 cm parapelvic cyst. Additional smaller cyst in the inferior pole of the right kidney. Left Kidney: Renal measurements: 12.2 x 6.3 x 5.9 cm = volume: 236 mL. Normal echogenicity. No hydronephrosis or shadowing stone. A 4.7 cm inferior pole cyst. Bladder: The bladder is not visualized. Other: None. IMPRESSION: 1. No hydronephrosis or shadowing stone. 2. Bilateral renal cysts. Electronically Signed   By: Elgie Collard M.D.   On: 05/19/2022 19:16     Medical Consultants:   None.   Subjective:    James Dyer no complaints.  Objective:    Vitals:   05/21/22 0044 05/21/22 0443 05/21/22 0800 05/21/22 1059  BP: 118/67 111/75 (!) 152/106 127/85  Pulse: 77 68 76 79  Resp: Temp: (!) 97.4 F (36.3 C) 97.7 F (36.5 C) 97.6 F (36.4 C) (!) 97.4 F (36.3 C)  TempSrc: Oral Oral Oral Oral  SpO2: 97% 97% 95% 96%  Weight:  107.3 kg    Height:       SpO2: 96 % O2 Flow Rate (L/min): 2 L/min   Intake/Output Summary (Last 24 hours) at 05/21/2022 1207 Last data filed at 05/21/2022 1042 Gross per 24 hour  Intake 100 ml  Output 650 ml  Net -550 ml    Filed Weights   05/19/22 0443 05/20/22 0009 05/21/22 0443  Weight: 100.4 kg 104.3 kg 107.3 kg    Exam: General exam: In no acute distress. Respiratory system: Good air movement and clear to auscultation. Cardiovascular system: S1 &  S2 heard, RRR. No JVD. Gastrointestinal system: Abdomen is nondistended, soft and nontender.  Extremities: No pedal edema. Skin: No rashes, lesions or ulcers Psychiatry: Judgement and insight appear normal. Mood & affect appropriate.   Data Reviewed:    Labs: Basic Metabolic Panel: Recent Labs  Lab 05/15/22 0057 05/16/22 0051 05/17/22 0556 05/18/22 0037 05/19/22 0059  NA 133* 137 139 140 137  K 3.7 3.4* 4.0 3.7 3.6  CL 100 109 105 105 103  CO2 GLUCOSE 188* 113* 146* 172* 213*  BUN 24* CREATININE 1.56* 1.08 0.91 0.89 0.94  CALCIUM 7.7* 7.5* 8.0* 8.4* 8.3*  MG 2.5* 2.0  --   --   --     GFR Estimated Creatinine Clearance: 80.7 mL/min (by C-G formula based on SCr of 0.94 mg/dL). Liver Function Tests: Recent Labs  Lab 05/15/22 0057  AST 57*  ALT 33  ALKPHOS 38  BILITOT 0.7  PROT 5.4*  ALBUMIN 2.9*    No results for input(s): "LIPASE", "AMYLASE" in the last 168 hours. Recent Labs  Lab 05/15/22 1021  AMMONIA 36*    Coagulation profile No results for  input(s): "INR", "PROTIME" in the last 168 hours.  COVID-19 Labs  No results for input(s): "DDIMER", "FERRITIN", "LDH", "CRP" in the last 72 hours.   Lab Results  Component Value Date   SARSCOV2NAA NEGATIVE 05/12/2022   SARSCOV2NAA NEGATIVE 04/20/2019   SARSCOV2NAA Not Detected 12/19/2018   SARSCOV2NAA Not Detected 07/09/2018    CBC: Recent Labs  Lab 05/15/22 0057 05/16/22 0051 05/18/22 1506 05/19/22 0059  WBC 3.3* 4.3 6.7 6.9  NEUTROABS 1.2* 3.4 4.2  --   HGB 14.5 13.4 13.2 12.6*  HCT 43.2 39.8 39.2 40.5  MCV 87.3 86.5 87.9 94.4  PLT 72* 73* 131* 157    Cardiac Enzymes: No results for input(s): "CKTOTAL", "CKMB", "CKMBINDEX", "TROPONINI" in the last 168 hours.  BNP (last 3 results) No results for input(s): "PROBNP" in the last 8760 hours. CBG: Recent Labs  Lab 05/20/22 0558 05/20/22 1147 05/20/22 2121 05/21/22 0604 05/21/22 1052  GLUCAP 112* 156* 210*  124* 149*    D-Dimer: No results for input(s): "DDIMER" in the last 72 hours. Hgb A1c: No results for input(s): "HGBA1C" in the last 72 hours.  Lipid Profile: No results for input(s): "CHOL", "HDL", "LDLCALC", "TRIG", "CHOLHDL", "LDLDIRECT" in the last 72 hours. Thyroid function studies: No results for input(s): "TSH", "T4TOTAL", "T3FREE", "THYROIDAB" in the last 72 hours.  Invalid input(s): "FREET3"  Anemia work up: No results for input(s): "VITAMINB12", "FOLATE", "FERRITIN", "TIBC", "IRON", "RETICCTPCT" in the last 72 hours.  Sepsis Labs: Recent Labs  Lab 05/15/22 0057 05/15/22 1021 05/16/22 0051 05/18/22 1506 05/19/22 0059  PROCALCITON  --  <0.10  --   --   --   WBC 3.3*  --  4.3 6.7 6.9    Microbiology Recent Results (from the past 240 hour(s))  Resp panel by RT-PCR (RSV, Flu A&B, Covid) Anterior Nasal Swab     Status: None   Collection Time: 05/12/22  9:00 PM   Specimen: Anterior Nasal Swab  Result Value Ref Range Status   SARS Coronavirus 2 by RT PCR NEGATIVE NEGATIVE Final   Influenza A by PCR NEGATIVE NEGATIVE Final   Influenza B by PCR NEGATIVE NEGATIVE Final    Comment: (NOTE) The Xpert Xpress SARS-CoV-2/FLU/RSV plus assay is intended as an aid in the diagnosis of influenza from Nasopharyngeal swab specimens and should not be used as a sole basis for treatment. Nasal washings and aspirates are unacceptable for Xpert Xpress SARS-CoV-2/FLU/RSV testing.  Fact Sheet for Patients: BloggerCourse.com  Fact Sheet for Healthcare Providers: SeriousBroker.it  This test is not yet approved or cleared by the [place] FDA and has been authorized for detection and/or diagnosis of SARS-CoV-2 by FDA under an Emergency Use Authorization (EUA). This EUA will remain in effect (meaning this test can be used) for the duration of the COVID-19 declaration under Section 564(b)(1) of the Act, 21 U.S.C. section  360bbb-3(b)(1), unless the authorization is terminated or revoked.     Resp Syncytial Virus by PCR NEGATIVE NEGATIVE Final    Comment: (NOTE) Fact Sheet for Patients: BloggerCourse.com  Fact Sheet for Healthcare Providers: SeriousBroker.it  This test is not yet approved or cleared by the [place] FDA and has been authorized for detection and/or diagnosis of SARS-CoV-2 by FDA under an Emergency Use Authorization (EUA). This EUA will remain in effect (meaning this test can be used) for the duration of the COVID-19 declaration under Section 564(b)(1) of the Act, 21 U.S.C. section 360bbb-3(b)(1), unless the authorization is terminated or revoked.  Performed at [facility],  [address] [place], [place] [address]   Blood culture (routine x 2)     Status: None   Collection Time: 05/12/22 11:32 PM   Specimen: BLOOD  Result Value Ref Range Status   Specimen Description BLOOD SITE NOT SPECIFIED  Final   Special Requests   Final    BOTTLES DRAWN AEROBIC AND ANAEROBIC Blood Culture results may not be optimal due to an excessive volume of blood received in culture bottles   Culture   Final    NO GROWTH 5 DAYS Performed at [facility], [address], [place], [place] [address]    Report Status 05/18/2022 FINAL  Final  Blood culture (routine x 2)     Status: None   Collection Time: 05/12/22 11:32 PM   Specimen: BLOOD  Result Value Ref Range Status   Specimen Description BLOOD SITE NOT SPECIFIED  Final   Special Requests   Final    BOTTLES DRAWN AEROBIC AND ANAEROBIC Blood Culture results may not be optimal due to an excessive volume of blood received in culture bottles   Culture   Final    NO GROWTH 5 DAYS Performed at [facility], [address], [place], [place] [address]    Report Status 05/18/2022 FINAL  Final  Urine Culture (for pregnant, neutropenic or urologic patients or patients with an indwelling  urinary catheter)     Status: Abnormal (Preliminary result)   Collection Time: 05/18/22  3:11 PM   Specimen: Urine, Catheterized  Result Value Ref Range Status   Specimen Description URINE, CATHETERIZED  Final   Special Requests NONE  Final   Culture (A)  Final    >=100,000 COLONIES/mL ESCHERICHIA COLI REPEATING SUSCEPTIBILITIES TO FOLLOW Performed at [facility], [address], [place], [place] [address]    Report Status PENDING  Incomplete     Medications:    apixaban  2.5 mg Oral BID   Chlorhexidine Gluconate Cloth  6 each Topical Daily   fesoterodine  4 mg Oral Daily   metoprolol tartrate  12.5 mg Oral BID   sodium chloride flush  3 mL Intravenous Q12H   tamsulosin  0.4 mg Oral QPC breakfast   Continuous Infusions:  cefTRIAXone (ROCEPHIN)  IV Stopped (05/20/22 1740)       LOS: 8 days   Marinda Elk  [facility]  05/21/2022, 12:07 PM

## 2022-05-22 DIAGNOSIS — S0012XD Contusion of left eyelid and periocular area, subsequent encounter: Secondary | ICD-10-CM | POA: Diagnosis not present

## 2022-05-22 DIAGNOSIS — R338 Other retention of urine: Secondary | ICD-10-CM | POA: Diagnosis not present

## 2022-05-22 DIAGNOSIS — R4182 Altered mental status, unspecified: Secondary | ICD-10-CM | POA: Diagnosis not present

## 2022-05-22 DIAGNOSIS — R262 Difficulty in walking, not elsewhere classified: Secondary | ICD-10-CM | POA: Diagnosis not present

## 2022-05-22 DIAGNOSIS — G9341 Metabolic encephalopathy: Secondary | ICD-10-CM | POA: Diagnosis not present

## 2022-05-22 DIAGNOSIS — E162 Hypoglycemia, unspecified: Secondary | ICD-10-CM | POA: Diagnosis not present

## 2022-05-22 DIAGNOSIS — W19XXXA Unspecified fall, initial encounter: Secondary | ICD-10-CM | POA: Diagnosis not present

## 2022-05-22 DIAGNOSIS — E861 Hypovolemia: Secondary | ICD-10-CM | POA: Diagnosis not present

## 2022-05-22 DIAGNOSIS — Z7901 Long term (current) use of anticoagulants: Secondary | ICD-10-CM | POA: Diagnosis not present

## 2022-05-22 DIAGNOSIS — R3915 Urgency of urination: Secondary | ICD-10-CM | POA: Diagnosis not present

## 2022-05-22 DIAGNOSIS — G709 Myoneural disorder, unspecified: Secondary | ICD-10-CM | POA: Diagnosis not present

## 2022-05-22 DIAGNOSIS — R2681 Unsteadiness on feet: Secondary | ICD-10-CM | POA: Diagnosis not present

## 2022-05-22 DIAGNOSIS — Z978 Presence of other specified devices: Secondary | ICD-10-CM | POA: Diagnosis not present

## 2022-05-22 DIAGNOSIS — M1990 Unspecified osteoarthritis, unspecified site: Secondary | ICD-10-CM | POA: Diagnosis not present

## 2022-05-22 DIAGNOSIS — I152 Hypertension secondary to endocrine disorders: Secondary | ICD-10-CM | POA: Diagnosis not present

## 2022-05-22 DIAGNOSIS — N179 Acute kidney failure, unspecified: Secondary | ICD-10-CM | POA: Diagnosis not present

## 2022-05-22 DIAGNOSIS — N200 Calculus of kidney: Secondary | ICD-10-CM | POA: Diagnosis not present

## 2022-05-22 DIAGNOSIS — I10 Essential (primary) hypertension: Secondary | ICD-10-CM | POA: Diagnosis not present

## 2022-05-22 DIAGNOSIS — E1169 Type 2 diabetes mellitus with other specified complication: Secondary | ICD-10-CM | POA: Diagnosis not present

## 2022-05-22 DIAGNOSIS — Z9181 History of falling: Secondary | ICD-10-CM | POA: Diagnosis not present

## 2022-05-22 DIAGNOSIS — E11649 Type 2 diabetes mellitus with hypoglycemia without coma: Secondary | ICD-10-CM | POA: Diagnosis not present

## 2022-05-22 DIAGNOSIS — S0011XD Contusion of right eyelid and periocular area, subsequent encounter: Secondary | ICD-10-CM | POA: Diagnosis not present

## 2022-05-22 DIAGNOSIS — E785 Hyperlipidemia, unspecified: Secondary | ICD-10-CM | POA: Diagnosis not present

## 2022-05-22 DIAGNOSIS — R488 Other symbolic dysfunctions: Secondary | ICD-10-CM | POA: Diagnosis not present

## 2022-05-22 DIAGNOSIS — R296 Repeated falls: Secondary | ICD-10-CM | POA: Diagnosis not present

## 2022-05-22 DIAGNOSIS — Z87891 Personal history of nicotine dependence: Secondary | ICD-10-CM | POA: Diagnosis not present

## 2022-05-22 DIAGNOSIS — I7121 Aneurysm of the ascending aorta, without rupture: Secondary | ICD-10-CM | POA: Diagnosis not present

## 2022-05-22 DIAGNOSIS — R531 Weakness: Secondary | ICD-10-CM | POA: Diagnosis not present

## 2022-05-22 DIAGNOSIS — R339 Retention of urine, unspecified: Secondary | ICD-10-CM | POA: Diagnosis not present

## 2022-05-22 DIAGNOSIS — R413 Other amnesia: Secondary | ICD-10-CM | POA: Diagnosis not present

## 2022-05-22 DIAGNOSIS — I480 Paroxysmal atrial fibrillation: Secondary | ICD-10-CM | POA: Diagnosis not present

## 2022-05-22 DIAGNOSIS — E7800 Pure hypercholesterolemia, unspecified: Secondary | ICD-10-CM | POA: Diagnosis not present

## 2022-05-22 DIAGNOSIS — E1159 Type 2 diabetes mellitus with other circulatory complications: Secondary | ICD-10-CM | POA: Diagnosis not present

## 2022-05-22 DIAGNOSIS — S0990XA Unspecified injury of head, initial encounter: Secondary | ICD-10-CM | POA: Diagnosis not present

## 2022-05-22 DIAGNOSIS — I700 Atherosclerosis of aorta: Secondary | ICD-10-CM | POA: Diagnosis not present

## 2022-05-22 DIAGNOSIS — N1330 Unspecified hydronephrosis: Secondary | ICD-10-CM | POA: Diagnosis not present

## 2022-05-22 DIAGNOSIS — C61 Malignant neoplasm of prostate: Secondary | ICD-10-CM | POA: Diagnosis not present

## 2022-05-22 DIAGNOSIS — M21371 Foot drop, right foot: Secondary | ICD-10-CM | POA: Diagnosis not present

## 2022-05-22 DIAGNOSIS — M21372 Foot drop, left foot: Secondary | ICD-10-CM | POA: Diagnosis not present

## 2022-05-22 DIAGNOSIS — M6281 Muscle weakness (generalized): Secondary | ICD-10-CM | POA: Diagnosis not present

## 2022-05-22 DIAGNOSIS — B9620 Unspecified Escherichia coli [E. coli] as the cause of diseases classified elsewhere: Secondary | ICD-10-CM | POA: Diagnosis not present

## 2022-05-22 DIAGNOSIS — N390 Urinary tract infection, site not specified: Secondary | ICD-10-CM | POA: Diagnosis not present

## 2022-05-22 DIAGNOSIS — N3281 Overactive bladder: Secondary | ICD-10-CM | POA: Diagnosis not present

## 2022-05-22 DIAGNOSIS — D696 Thrombocytopenia, unspecified: Secondary | ICD-10-CM | POA: Diagnosis not present

## 2022-05-22 DIAGNOSIS — Z7401 Bed confinement status: Secondary | ICD-10-CM | POA: Diagnosis not present

## 2022-05-22 LAB — GLUCOSE, CAPILLARY
Glucose-Capillary: 125 mg/dL — ABNORMAL HIGH (ref 70–99)
Glucose-Capillary: 203 mg/dL — ABNORMAL HIGH (ref 70–99)

## 2022-05-22 LAB — URINE CULTURE

## 2022-05-22 MED ORDER — TAMSULOSIN HCL 0.4 MG PO CAPS: 0.4000 mg | ORAL_CAPSULE | Freq: Every day | ORAL | Status: DC

## 2022-05-22 MED ORDER — SULFAMETHOXAZOLE-TRIMETHOPRIM 800-160 MG PO TABS: 1.0000 | ORAL_TABLET | Freq: Two times a day (BID) | ORAL | 0 refills | Status: DC

## 2022-05-22 MED ORDER — SULFAMETHOXAZOLE-TRIMETHOPRIM 800-160 MG PO TABS
1.0000 | ORAL_TABLET | Freq: Two times a day (BID) | ORAL | Status: DC
  Administered 2022-05-22: 1 via ORAL
  Filled 2022-05-22 (×2): qty 1

## 2022-05-22 NOTE — TOC Transition Note (Signed)
Transition of Care [facility]) - CM/SW Discharge Note   Patient Details  Name: KASE SHUGHART MRN: [id number] Date of Birth: 04-28-1945  Transition of Care [facility]) CM/SW Contact:  Leander Rams, LCSW Phone Number: 05/22/2022, 11:57 AM   Clinical Narrative:    Patient will DC to: [facility] Anticipated DC date: 05/22/2022 Family notified: Dois Davenport Transport by: Sharin Mons   Per MD patient ready for DC to [facility]. RN, patient, patient's family, and facility notified of DC. Discharge Summary and FL2 sent to facility. RN to call report prior to discharge [phone]. DC packet on chart. Ambulance transport requested for patient.   [name] will sign off for now as social work intervention is no longer needed. Please consult [place] again if new needs arise.    Final next level of care: Skilled Nursing Facility Barriers to Discharge: No Barriers Identified   Patient Goals and CMS Choice      Discharge Placement                Patient chooses bed at: [facility] Patient to be transferred to facility by: [facility] Name of family member notified: Dois Davenport Patient and family notified of of transfer: 05/22/22  Discharge Plan and Services Additional resources added to the After Visit Summary for                                       Social Determinants of Health (SDOH) Interventions SDOH Screenings   Food Insecurity: No Food Insecurity (05/13/2022)  Housing: Low Risk  (05/13/2022)  Transportation Needs: No Transportation Needs (05/13/2022)  Utilities: Not At Risk (05/13/2022)  Tobacco Use: Medium Risk (05/12/2022)     Readmission Risk Interventions     No data to display         Oletta Lamas, MSW, LCSWA, LCASA Transitions of Care  Clinical Social Worker I

## 2022-05-22 NOTE — Progress Notes (Signed)
Patient discharged via [facility] to [facility].

## 2022-05-22 NOTE — Progress Notes (Signed)
Speech Language Pathology Treatment:    Patient Details Name: James Dyer MRN: [id number] DOB: 1945-05-02 Today's Date: 05/22/2022 Time: 7829-5621 SLP Time Calculation (min) (ACUTE ONLY): 20 min  Assessment / Plan / Recommendation Clinical Impression  Pt seen for cognitive therapy with son present. Pt alert and seen for problem solving and read directions for activity and answered questions with min assist. Discussed memory and utilized writing as a strategy. Pt wrote information pertaining to disposition/transfer to SNF today with some difficulty with spelling. He also wrote information re: hobbies at home with extra time needed. Discussed pt's cognition with son who suspects pt may have some dementia starting and worsened by the fall and SLP agrees. Son asking about testing for dementia and SLP suggested to contact pt's primary care giver once discharged home and neuropsychology are typically who can make a diagnosis of dementia. Pt to be discharged to SNF today.     HPI HPI: Pt is a 77 y.o. male who presented 05/12/22 s/p fall with AMS. Imaging showed no acute intracranial abnormality, no acute displaced facial fracture, no acute displaced fracture or traumatic listhesis of the C-spine. CT chest/abdomen/pelvis with contrast negative for acute fracture or solid organ injury. Admitted with AKI and hypoglycemia. PMH: PAF on Eliquis, T2DM, HTN, HLD, chronic bilateral foot drop, history of prostate cancer      SLP Plan  Other (Comment) (discharging to SNF- continued ST at [facility])      Recommendations for follow up therapy are one component of a multi-disciplinary discharge planning process, led by the attending physician.  Recommendations may be updated based on patient status, additional functional criteria and insurance authorization.    Recommendations                     Oral care BID   Intermittent Supervision/Assistance Cognitive communication deficit (R41.841)     Other  (Comment) (discharging to SNF- continued ST at SNF)     Royce Macadamia  05/22/2022, 12:16 PM

## 2022-05-22 NOTE — Care Management Important Message (Signed)
Important Message  Patient Details  Name: James Dyer MRN: [id number] Date of Birth: [birth year]   Medicare Important Message Given:  Yes     Renie Ora 05/22/2022, 10:24 AM

## 2022-05-22 NOTE — Progress Notes (Addendum)
Verbal report called to Shanda Bumps at [facility]. All questions answered.  Copy of AVS given to patient and son.

## 2022-05-22 NOTE — Consult Note (Signed)
   [facility] Inpatient Consult   05/22/2022  [name] January 20, 1946 [id number]  Update/Follow up:  SNF disposiiton  [facility] [[facility]]  Accountable Care Organization [ACO] Patient: Medicare ACO Medicare  Primary Care Provider:  [name], MD with [facility]   Patient was reviewed for disposition  Patient transitioned to [facility] patient can be followed by [facility] [[facility]] Care Management [facility] RN with traditional Medicare and approved Medicare Advantage plans.    Plan:   Notify [facility] RN who can follow for any known or needs for transitional care needs for returning to post facility care coordination needs to return to community.  For questions or referrals, please contact:   Charlesetta Shanks, RN BSN CCM [facility]  [phone] business mobile phone Toll free office [phone]  *Concierge Line  [phone] [email or web address] number: [phone] [email or web address]

## 2022-05-22 NOTE — Discharge Summary (Signed)
Physician Discharge Summary  JUELL RADNEY GEX:[id number] DOB: [birth year] DOA: 05/12/2022  PCP: [name], MD  Admit date: 05/12/2022 Discharge date: 05/22/2022  Admitted From: Home Disposition:  SNF  Recommendations for Outpatient Follow-up:  Follow up with PCP in 1-2 weeks Please obtain BMP/CBC in one week Need to follow-up with vascular in 4 to 6 weeks for aortic abdominal aneurysm. Will need to follow-up with urology in 6 to 8 weeks for BPH.   Home Health:No Equipment/Devices:None  Discharge Condition:STable CODE STATUS:Full Diet recommendation: Heart Healthy   Brief/Interim Summary: 77 y.o. male past medical history significant for paroxysmal atrial fibrillation on Eliquis, diabetes mellitus type 2, chronic bilateral foot drop, history of prostate cancer admitted for acute kidney injury and altered mental status after falling at home.   Discharge Diagnoses:  Principal Problem:   Acute kidney injury Active Problems:   HTN (hypertension)   Type 2 diabetes mellitus with hypoglycemia   Paroxysmal atrial fibrillation   Elevated CK   Fall at home, initial encounter   Altered mental status   Thrombocytopenia   Fever   AKI (acute kidney injury)   Acute metabolic encephalopathy   Hypoglycemia   Fall   Hypomagnesemia   Hypotension due to hypovolemia   Acute urinary retention  Acute kidney injury: Likely prerenal azotemia on admission creatinine 1.4 with a baseline around 1.0, CK was also elevated. After hydration creatinine returned to baseline rhabdomyolysis resolved.  Mild rhabdomyolysis nontraumatic: Resolved with IV fluid resuscitation.  BPH/possible UTI: Foley was placed on admission due to acute urinary retention urine cultures were done that which showed E. coli sensitive to Bactrim he will continue Bactrim for 7 days as an outpatient. Foley was discontinued he passed a voiding trial. Need to follow-up with urology in 6 to 8 weeks.  Acute metabolic  encephalopathy: Likely due to hypoglycemia MRI of the brain showed no acute normalities Amaryl was discontinued hemoglobin A1c is 5.9. He will go home off all oral hypoglycemic agents.  Hypotension: Likely secondary to hypovolemia antihypertensive medications were discontinued. Blood cultures remain negative. TSH was unremarkable adrenal insufficiency was ruled out. Procalcitonin was unremarkable he was fluid resuscitated his blood pressure improved. ARB.  Continued and he will go home off ARB.  Sinus tachycardia: Likely rebound tachycardia as he was taking off his metoprolol due to his hypotension. Once his blood pressure improved metoprolol was restarted and his heart rate remained controlled.  Fall at home/chronic bilateral foot drop: Underwent extensive imaging no significant fractures trauma or dislocation. The fall was likely due to the hypoglycemia. Physical therapy evaluated the patient recommended acute inpatient rehab.  Diabetes mellitus type 2 with episodes of hypoglycemia: Likely contributing to his encephalopathy and falls. He received D50 in the ED then was placed on D5. A1c was 5.9. Amaryl was discontinued he will follow-up with PCP as an outpatient.  Fever: Likely due to UTI now resolved.  Paroxysmal atrial fibrillation: No change made to his medication continue metoprolol and Eliquis at current dose. Eliquis had to be held temporarily as due to his fall he was developing bruise. Hemoglobin remained stable he will resume Eliquis as an outpatient.  Ascending aortic aneurysm: Follow-up with PCP as an outpatient. Will follow-up with vascular surgery for further imaging as an outpatient.  Electrolyte imbalance hypokalemia/hypomagnesemia: Potassium was kept greater than 4 magnesium greater than 2.  Memory issues: CT of the head showed no acute intracranial abnormalities MRI was done that showed no acute findings. Workup has been negative. He will have  to be  worked up as an outpatient for dementia.  Acute urinary retention: Will start her on Flomax Foley was inserted. He was given a voiding trial prior to discharge he was able to urinate without any difficulties. Follow-up with urology as an outpatient     Discharge Instructions  Discharge Instructions     Ambulatory referral to Neurology   Complete by: As directed    An appointment is requested in approximately: 2 weeks Patient with memory issues, ??dementia   Diet - low sodium heart healthy   Complete by: As directed    Increase activity slowly   Complete by: As directed       Allergies as of 05/22/2022       Reactions   Latex Other (See Comments)   "sores in mouth after teeth cleaned"        Medication List     STOP taking these medications    amLODipine 5 MG tablet Commonly known as: NORVASC   fluticasone 50 MCG/ACT nasal spray Commonly known as: FLONASE   glimepiride 1 MG tablet Commonly known as: AMARYL   losartan 100 MG tablet Commonly known as: COZAAR       TAKE these medications    Eliquis 5 MG Tabs tablet Generic drug: apixaban TAKE 1 TABLET(5 MG) BY MOUTH TWICE DAILY What changed: See the new instructions.   ezetimibe 10 MG tablet Commonly known as: ZETIA Take 10 mg by mouth daily.   metoprolol tartrate 25 MG tablet Commonly known as: LOPRESSOR TAKE 1 TABLET(25 MG) BY MOUTH TWICE DAILY What changed:  how much to take how to take this when to take this additional instructions   PAIN RELIEF ROLL-ON EX Apply 1 application topically daily as needed (applied to feet if needed for pain.).   solifenacin 10 MG tablet Commonly known as: VESICARE Take 1 tablet (10 mg total) by mouth daily.   sulfamethoxazole-trimethoprim 800-160 MG tablet Commonly known as: BACTRIM DS Take 1 tablet by mouth every 12 (twelve) hours for 7 days.   tamsulosin 0.4 MG Caps capsule Commonly known as: FLOMAX Take 1 capsule (0.4 mg total) by mouth daily after  breakfast. Start taking on: May 23, 2022               Durable Medical Equipment  (From admission, onward)           Start     Ordered   05/13/22 1819  For home use only DME 3 n 1  Once        05/13/22 1818            Allergies  Allergen Reactions   Latex Other (See Comments)    "sores in mouth after teeth cleaned"    Consultations: None   Procedures/Studies: US RENAL  Result Date: 05/19/2022 CLINICAL DATA:  Hematuria. EXAM: RENAL / URINARY TRACT ULTRASOUND COMPLETE COMPARISON:  CT dated 05/12/2022. FINDINGS: Right Kidney: Renal measurements: 12.5 x 6.0 x 5.1 cm = volume: 202 mL. Normal echogenicity. No hydronephrosis or shadowing stone. A 1.7 cm parapelvic cyst. Additional smaller cyst in the inferior pole of the right kidney. Left Kidney: Renal measurements: 12.2 x 6.3 x 5.9 cm = volume: 236 mL. Normal echogenicity. No hydronephrosis or shadowing stone. A 4.7 cm inferior pole cyst. Bladder: The bladder is not visualized. Other: None. IMPRESSION: 1. No hydronephrosis or shadowing stone. 2. Bilateral renal cysts. Electronically Signed   By: Elgie Collard M.D.   On: 05/19/2022 19:16   MR BRAIN  WO CONTRAST  Result Date: 05/13/2022 CLINICAL DATA:  Initial evaluation for mental status change, unknown cause. Recent falls. EXAM: MRI HEAD WITHOUT CONTRAST TECHNIQUE: Multiplanar, multiecho pulse sequences of the brain and surrounding structures were obtained without intravenous contrast. COMPARISON:  Prior CTs from 05/12/2022. FINDINGS: Brain: Examination degraded by motion artifact. Diffuse prominence of the CSF containing spaces compatible generalized cerebral atrophy. Patchy and confluent T2/FLAIR hyperdensity involving the periventricular deep white matter both cerebral hemispheres, consistent with chronic small vessel ischemic disease, moderate to advanced in nature. No abnormal foci of restricted diffusion to suggest acute or subacute ischemia. Gray-white matter  differentiation maintained. No areas of chronic cortical infarction. No acute intracranial hemorrhage. Few punctate chronic micro hemorrhages noted about the right cerebellum and occipital regions, likely hypertensive and/or small vessel related. No mass lesion, midline shift or mass effect. Mild ventricular prominence related global parenchymal volume loss without hydrocephalus. No extra-axial fluid collection. Pituitary gland and suprasellar region within normal limits. Vascular: Major intracranial vascular flow voids are maintained. Skull and upper cervical spine: Craniocervical junction within normal limits. Signal changes at the dens related to a prominent geode formation noted, also seen on prior CT. Underlying bone marrow signal intensity within normal limits. Evolving right frontal scalp contusion/hematoma. Sinuses/Orbits: Globes and orbital soft tissues demonstrate no acute finding. Mild scattered mucoperiosteal thickening present throughout the paranasal sinuses. Trace left mastoid effusion, of doubtful significance. Other: None. IMPRESSION: 1. No acute intracranial abnormality. 2. Evolving right frontal scalp contusion/hematoma. 3. Age-related cerebral atrophy with moderate to advanced chronic microvascular ischemic disease. Electronically Signed   By: Rise Mu M.D.   On: 05/13/2022 03:55   CT HEAD WO CONTRAST  Result Date: 05/12/2022 CLINICAL DATA:  Head trauma, moderate-severe; Facial trauma, blunt; Neck trauma (Age >= 65y) EXAM: CT HEAD WITHOUT CONTRAST CT MAXILLOFACIAL WITHOUT CONTRAST CT CERVICAL SPINE WITHOUT CONTRAST TECHNIQUE: Multidetector CT imaging of the head, cervical spine, and maxillofacial structures were performed using the standard protocol without intravenous contrast. Multiplanar CT image reconstructions of the cervical spine and maxillofacial structures were also generated. RADIATION DOSE REDUCTION: This exam was performed according to the departmental  dose-optimization program which includes automated exposure control, adjustment of the mA and/or kV according to patient size and/or use of iterative reconstruction technique. COMPARISON:  CT head 09/25/2021 FINDINGS: CT HEAD FINDINGS Brain: Cerebral ventricle sizes are concordant with the degree of cerebral volume loss. Patchy and confluent areas of decreased attenuation are noted throughout the deep and periventricular white matter of the cerebral hemispheres bilaterally, compatible with chronic microvascular ischemic disease. No evidence of large-territorial acute infarction. No parenchymal hemorrhage. No mass lesion. No extra-axial collection. No mass effect or midline shift. No hydrocephalus. Basilar cisterns are patent. Vascular: No hyperdense vessel. Atherosclerotic calcifications are present within the cavernous internal carotid and vertebral arteries. Skull: No acute fracture or focal lesion. Other: Right frontal scalp hematoma formation. CT MAXILLOFACIAL FINDINGS Osseous: No fracture or mandibular dislocation. No destructive process. Sinuses/Orbits: Paranasal sinuses and mastoid air cells are clear. The orbits are unremarkable. Soft tissues: Negative. CT CERVICAL SPINE FINDINGS Alignment: Normal. Skull base and vertebrae: Severe multilevel degenerative changes spine most prominent at the C5-C6 level. Multilevel moderate to severe osseous neural foraminal stenosis. No severe osseous central canal stenosis. No acute fracture. No aggressive appearing focal osseous lesion or focal pathologic process. Soft tissues and spinal canal: No prevertebral fluid or swelling. No visible canal hematoma. Upper chest: Interlobular septal wall thickening. Other: None. IMPRESSION: 1. No acute intracranial abnormality. 2.  No  acute displaced facial fracture. 3. No acute displaced fracture or traumatic listhesis of the cervical spine. 4. Likely mild pulmonary edema. Electronically Signed   By: Tish Frederickson M.D.   On:  05/12/2022 21:38   CT Cervical Spine Wo Contrast  Result Date: 05/12/2022 CLINICAL DATA:  Head trauma, moderate-severe; Facial trauma, blunt; Neck trauma (Age >= 65y) EXAM: CT HEAD WITHOUT CONTRAST CT MAXILLOFACIAL WITHOUT CONTRAST CT CERVICAL SPINE WITHOUT CONTRAST TECHNIQUE: Multidetector CT imaging of the head, cervical spine, and maxillofacial structures were performed using the standard protocol without intravenous contrast. Multiplanar CT image reconstructions of the cervical spine and maxillofacial structures were also generated. RADIATION DOSE REDUCTION: This exam was performed according to the departmental dose-optimization program which includes automated exposure control, adjustment of the mA and/or kV according to patient size and/or use of iterative reconstruction technique. COMPARISON:  CT head 09/25/2021 FINDINGS: CT HEAD FINDINGS Brain: Cerebral ventricle sizes are concordant with the degree of cerebral volume loss. Patchy and confluent areas of decreased attenuation are noted throughout the deep and periventricular white matter of the cerebral hemispheres bilaterally, compatible with chronic microvascular ischemic disease. No evidence of large-territorial acute infarction. No parenchymal hemorrhage. No mass lesion. No extra-axial collection. No mass effect or midline shift. No hydrocephalus. Basilar cisterns are patent. Vascular: No hyperdense vessel. Atherosclerotic calcifications are present within the cavernous internal carotid and vertebral arteries. Skull: No acute fracture or focal lesion. Other: Right frontal scalp hematoma formation. CT MAXILLOFACIAL FINDINGS Osseous: No fracture or mandibular dislocation. No destructive process. Sinuses/Orbits: Paranasal sinuses and mastoid air cells are clear. The orbits are unremarkable. Soft tissues: Negative. CT CERVICAL SPINE FINDINGS Alignment: Normal. Skull base and vertebrae: Severe multilevel degenerative changes spine most prominent at the  C5-C6 level. Multilevel moderate to severe osseous neural foraminal stenosis. No severe osseous central canal stenosis. No acute fracture. No aggressive appearing focal osseous lesion or focal pathologic process. Soft tissues and spinal canal: No prevertebral fluid or swelling. No visible canal hematoma. Upper chest: Interlobular septal wall thickening. Other: None. IMPRESSION: 1. No acute intracranial abnormality. 2.  No acute displaced facial fracture. 3. No acute displaced fracture or traumatic listhesis of the cervical spine. 4. Likely mild pulmonary edema. Electronically Signed   By: Tish Frederickson M.D.   On: 05/12/2022 21:38   CT Maxillofacial Wo Contrast  Result Date: 05/12/2022 CLINICAL DATA:  Head trauma, moderate-severe; Facial trauma, blunt; Neck trauma (Age >= 65y) EXAM: CT HEAD WITHOUT CONTRAST CT MAXILLOFACIAL WITHOUT CONTRAST CT CERVICAL SPINE WITHOUT CONTRAST TECHNIQUE: Multidetector CT imaging of the head, cervical spine, and maxillofacial structures were performed using the standard protocol without intravenous contrast. Multiplanar CT image reconstructions of the cervical spine and maxillofacial structures were also generated. RADIATION DOSE REDUCTION: This exam was performed according to the departmental dose-optimization program which includes automated exposure control, adjustment of the mA and/or kV according to patient size and/or use of iterative reconstruction technique. COMPARISON:  CT head 09/25/2021 FINDINGS: CT HEAD FINDINGS Brain: Cerebral ventricle sizes are concordant with the degree of cerebral volume loss. Patchy and confluent areas of decreased attenuation are noted throughout the deep and periventricular white matter of the cerebral hemispheres bilaterally, compatible with chronic microvascular ischemic disease. No evidence of large-territorial acute infarction. No parenchymal hemorrhage. No mass lesion. No extra-axial collection. No mass effect or midline shift. No  hydrocephalus. Basilar cisterns are patent. Vascular: No hyperdense vessel. Atherosclerotic calcifications are present within the cavernous internal carotid and vertebral arteries. Skull: No acute fracture or focal lesion. Other:  Right frontal scalp hematoma formation. CT MAXILLOFACIAL FINDINGS Osseous: No fracture or mandibular dislocation. No destructive process. Sinuses/Orbits: Paranasal sinuses and mastoid air cells are clear. The orbits are unremarkable. Soft tissues: Negative. CT CERVICAL SPINE FINDINGS Alignment: Normal. Skull base and vertebrae: Severe multilevel degenerative changes spine most prominent at the C5-C6 level. Multilevel moderate to severe osseous neural foraminal stenosis. No severe osseous central canal stenosis. No acute fracture. No aggressive appearing focal osseous lesion or focal pathologic process. Soft tissues and spinal canal: No prevertebral fluid or swelling. No visible canal hematoma. Upper chest: Interlobular septal wall thickening. Other: None. IMPRESSION: 1. No acute intracranial abnormality. 2.  No acute displaced facial fracture. 3. No acute displaced fracture or traumatic listhesis of the cervical spine. 4. Likely mild pulmonary edema. Electronically Signed   By: Tish Frederickson M.D.   On: 05/12/2022 21:38   CT CHEST ABDOMEN PELVIS W CONTRAST  Result Date: 05/12/2022 CLINICAL DATA:  Polytrauma, blunt.  Fall. EXAM: CT CHEST, ABDOMEN, AND PELVIS WITH CONTRAST TECHNIQUE: Multidetector CT imaging of the chest, abdomen and pelvis was performed following the standard protocol during bolus administration of intravenous contrast. RADIATION DOSE REDUCTION: This exam was performed according to the departmental dose-optimization program which includes automated exposure control, adjustment of the mA and/or kV according to patient size and/or use of iterative reconstruction technique. CONTRAST:  75mL OMNIPAQUE IOHEXOL 350 MG/ML SOLN COMPARISON:  01/18/2017. FINDINGS: CT CHEST  FINDINGS Cardiovascular: Heart is enlarged and there is no pericardial effusion. Coronary artery calcifications are noted. 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 The pulmonary trunk is normal in caliber. Mediastinum/Nodes: No enlarged mediastinal, hilar, or axillary lymph nodes. Thyroid gland, trachea, and esophagus demonstrate no significant findings. Lungs/Pleura: Atelectasis is present bilaterally. No effusion or pneumothorax. There is a subpleural nodule in the right middle lobe measuring 6 mm, axial image 88, unchanged from 2018 and likely benign. Musculoskeletal: Degenerative changes are present in the thoracic spine. No acute fracture. CT ABDOMEN PELVIS FINDINGS Hepatobiliary: No focal liver abnormality is seen. No gallstones, gallbladder wall thickening, or biliary dilatation. Pancreas: Unremarkable. No pancreatic ductal dilatation or surrounding inflammatory changes. Spleen: No splenic injury or perisplenic hematoma. Adrenals/Urinary Tract: The adrenal glands are within normal limits. Multiple cysts are present within the kidneys. No renal calculus or hydronephrosis. Bladder is unremarkable. Stomach/Bowel: Stomach is within normal limits. Appendix appears normal. No evidence of bowel wall thickening, distention, or inflammatory changes. No free air or pneumatosis. Vascular/Lymphatic: Aortic atherosclerosis. No enlarged abdominal or pelvic lymph nodes. Reproductive: Prostate is normal in size. Brachytherapy seeds are noted. Other: No abdominopelvic ascites. Musculoskeletal: Degenerative changes in the lumbar spine. No acute osseous abnormality. IMPRESSION: 1. No evidence of acute fracture or solid organ injury. 2. 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 Recommend annual imaging followup by CTA or MRA. This recommendation follows 2010 ACCF/AHA/AATS/ACR/ASA/SCA/SCAI/SIR/STS/SVM  Guidelines for the Diagnosis and Management of Patients with Thoracic Aortic Disease. Circulation. 2010; 121: U045-W098. Aortic aneurysm NOS (ICD10-I71.9) 3. Coronary artery calcifications. Electronically Signed   By: Thornell Sartorius M.D.   On: 05/12/2022 21:21   DG Pelvis Portable  Result Date: 05/12/2022 CLINICAL DATA:  Trauma, fall. EXAM: PORTABLE PELVIS 1-2 VIEWS COMPARISON:  None Available. FINDINGS: The cortical margins of the bony pelvis are intact. No fracture. Pubic symphysis and sacroiliac joints are congruent. Both femoral heads are well-seated in the respective acetabula. Mild bilateral hip degenerative change. IMPRESSION: No pelvic  fracture. Electronically Signed   By: Narda Rutherford M.D.   On: 05/12/2022 19:02   DG Chest Port 1 View  Result Date: 05/12/2022 CLINICAL DATA:  Trauma, fall. EXAM: PORTABLE CHEST 1 VIEW COMPARISON:  Radiograph 03/06/2013 FINDINGS: The cardiomediastinal contours are normal. Aortic arch atherosclerosis. Pulmonary vasculature is normal. No consolidation, pleural effusion, or pneumothorax. No acute osseous abnormalities are seen. IMPRESSION: No acute findings or evidence of traumatic injury. Electronically Signed   By: Narda Rutherford M.D.   On: 05/12/2022 19:01     Subjective: No complaints  Discharge Exam: Vitals:   05/22/22 0414 05/22/22 0810  BP: 126/88 114/87  Pulse: 87 81  Resp: 18 18  Temp: 98.5 F (36.9 C) 97.9 F (36.6 C)  SpO2: 97% 95%   Vitals:   05/22/22 0023 05/22/22 0414 05/22/22 0500 05/22/22 0810  BP: 121/84 126/88  114/87  Pulse: 76 87  81  Resp: 18 18  18   Temp: (!) 97.5 F (36.4 C) 98.5 F (36.9 C)  97.9 F (36.6 C)  TempSrc: Oral Oral  Oral  SpO2: 97% 97%  95%  Weight:   101.2 kg   Height:        General: Pt is alert, awake, not in acute distress Cardiovascular: RRR, S1/S2 +, no rubs, no gallops Respiratory: CTA bilaterally, no wheezing, no rhonchi Abdominal: Soft, NT, ND, bowel sounds + Extremities: no edema, no  cyanosis    The results of significant diagnostics from this hospitalization (including imaging, microbiology, ancillary and laboratory) are listed below for reference.     Microbiology: Recent Results (from the past 240 hour(s))  Resp panel by RT-PCR (RSV, Flu A&B, Covid) Anterior Nasal Swab     Status: None   Collection Time: 05/12/22  9:00 PM   Specimen: Anterior Nasal Swab  Result Value Ref Range Status   SARS Coronavirus 2 by RT PCR NEGATIVE NEGATIVE Final   Influenza A by PCR NEGATIVE NEGATIVE Final   Influenza B by PCR NEGATIVE NEGATIVE Final    Comment: (NOTE) The Xpert Xpress SARS-CoV-2/FLU/RSV plus assay is intended as an aid in the diagnosis of influenza from Nasopharyngeal swab specimens and should not be used as a sole basis for treatment. Nasal washings and aspirates are unacceptable for Xpert Xpress SARS-CoV-2/FLU/RSV testing.  Fact Sheet for Patients: BloggerCourse.com  Fact Sheet for Healthcare Providers: SeriousBroker.it  This test is not yet approved or cleared by the [place] FDA and has been authorized for detection and/or diagnosis of SARS-CoV-2 by FDA under an Emergency Use Authorization (EUA). This EUA will remain in effect (meaning this test can be used) for the duration of the COVID-19 declaration under Section 564(b)(1) of the Act, 21 U.S.C. section 360bbb-3(b)(1), unless the authorization is terminated or revoked.     Resp Syncytial Virus by PCR NEGATIVE NEGATIVE Final    Comment: (NOTE) Fact Sheet for Patients: BloggerCourse.com  Fact Sheet for Healthcare Providers: SeriousBroker.it  This test is not yet approved or cleared by the [place] FDA and has been authorized for detection and/or diagnosis of SARS-CoV-2 by FDA under an Emergency Use Authorization (EUA). This EUA will remain in effect (meaning this test can be used) for the  duration of the COVID-19 declaration under Section 564(b)(1) of the Act, 21 U.S.C. section 360bbb-3(b)(1), unless the authorization is terminated or revoked.  Performed at [facility], [address], [place], [place] [address]   Blood culture (routine x 2)     Status: None   Collection  Time: 05/12/22 11:32 PM   Specimen: BLOOD  Result Value Ref Range Status   Specimen Description BLOOD SITE NOT SPECIFIED  Final   Special Requests   Final    BOTTLES DRAWN AEROBIC AND ANAEROBIC Blood Culture results may not be optimal due to an excessive volume of blood received in culture bottles   Culture   Final    NO GROWTH 5 DAYS Performed at [facility], [address] [place], [place] [address]    Report Status 05/18/2022 FINAL  Final  Blood culture (routine x 2)     Status: None   Collection Time: 05/12/22 11:32 PM   Specimen: BLOOD  Result Value Ref Range Status   Specimen Description BLOOD SITE NOT SPECIFIED  Final   Special Requests   Final    BOTTLES DRAWN AEROBIC AND ANAEROBIC Blood Culture results may not be optimal due to an excessive volume of blood received in culture bottles   Culture   Final    NO GROWTH 5 DAYS Performed at [facility], [address] [place], [place] [address]    Report Status 05/18/2022 FINAL  Final  Urine Culture (for pregnant, neutropenic or urologic patients or patients with an indwelling urinary catheter)     Status: Abnormal   Collection Time: 05/18/22  3:11 PM   Specimen: Urine, Catheterized  Result Value Ref Range Status   Specimen Description URINE, CATHETERIZED  Final   Special Requests   Final    NONE Performed at [facility], [address] [place], [place] [address]    Culture >=100,000 COLONIES/mL ESCHERICHIA COLI (A)  Final   Report Status 05/22/2022 FINAL  Final   Organism ID, Bacteria ESCHERICHIA COLI (A)  Final      Susceptibility   Escherichia coli - MIC*    AMPICILLIN >=32 RESISTANT Resistant      CEFAZOLIN <=4 SENSITIVE Sensitive     CEFEPIME 4 INTERMEDIATE Intermediate     CIPROFLOXACIN <=0.25 SENSITIVE Sensitive     GENTAMICIN <=1 SENSITIVE Sensitive     IMIPENEM <=0.25 SENSITIVE Sensitive     NITROFURANTOIN <=16 SENSITIVE Sensitive     TRIMETH/SULFA <=20 SENSITIVE Sensitive     AMPICILLIN/SULBACTAM >=32 RESISTANT Resistant     PIP/TAZO INTERMEDIATE Intermediate     * >=100,000 COLONIES/mL ESCHERICHIA COLI  SARS Coronavirus 2 by RT PCR (hospital order, performed in [facility] hospital lab) *cepheid single result test* Anterior Nasal Swab     Status: None   Collection Time: 05/21/22  1:03 PM   Specimen: Anterior Nasal Swab  Result Value Ref Range Status   SARS Coronavirus 2 by RT PCR NEGATIVE NEGATIVE Final    Comment: Performed at [facility], [address], [place], [place] [address]     Labs: BNP (last 3 results) No results for input(s): "BNP" in the last 8760 hours. Basic Metabolic Panel: Recent Labs  Lab 05/16/22 0051 05/17/22 0556 05/18/22 0037 05/19/22 0059  NA 137 139 140 137  K 3.4* 4.0 3.7 3.6  CL 109 105 105 103  CO2 24 28 26 23   GLUCOSE 113* 146* 172* 213*  BUN 15 10 12 20   CREATININE 1.08 0.91 0.89 0.94  CALCIUM 7.5* 8.0* 8.4* 8.3*  MG 2.0  --   --   --    Liver Function Tests: No results for input(s): "AST", "ALT", "ALKPHOS", "BILITOT", "PROT", "ALBUMIN" in the last 168 hours. No results for input(s): "LIPASE", "AMYLASE" in the last  168 hours. No results for input(s): "AMMONIA" in the last 168 hours. CBC: Recent Labs  Lab 05/16/22 0051 05/18/22 1506 05/19/22 0059  WBC 4.3 6.7 6.9  NEUTROABS 3.4 4.2  --   HGB 13.4 13.2 12.6*  HCT 39.8 39.2 40.5  MCV 86.5 87.9 94.4  PLT 73* 131* 157   Cardiac Enzymes: No results for input(s): "CKTOTAL", "CKMB", "CKMBINDEX", "TROPONINI" in the last 168 hours. BNP: Invalid input(s): "POCBNP" CBG: Recent Labs  Lab 05/21/22 0604 05/21/22 1052 05/21/22 1552 05/21/22 2119 05/22/22 0623  GLUCAP  124* 149* 152* 152* 125*   D-Dimer No results for input(s): "DDIMER" in the last 72 hours. Hgb A1c No results for input(s): "HGBA1C" in the last 72 hours. Lipid Profile No results for input(s): "CHOL", "HDL", "LDLCALC", "TRIG", "CHOLHDL", "LDLDIRECT" in the last 72 hours. Thyroid function studies No results for input(s): "TSH", "T4TOTAL", "T3FREE", "THYROIDAB" in the last 72 hours.  Invalid input(s): "FREET3" Anemia work up No results for input(s): "VITAMINB12", "FOLATE", "FERRITIN", "TIBC", "IRON", "RETICCTPCT" in the last 72 hours. Urinalysis    Component Value Date/Time   COLORURINE AMBER (A) 05/18/2022 1447   APPEARANCEUR CLOUDY (A) 05/18/2022 1447   APPEARANCEUR Clear 07/11/2021 1556   LABSPEC 1.019 05/18/2022 1447   PHURINE 6.0 05/18/2022 1447   GLUCOSEU NEGATIVE 05/18/2022 1447   HGBUR LARGE (A) 05/18/2022 1447   BILIRUBINUR NEGATIVE 05/18/2022 1447   BILIRUBINUR Negative 07/11/2021 1556   KETONESUR NEGATIVE 05/18/2022 1447   PROTEINUR >=300 (A) 05/18/2022 1447   UROBILINOGEN 0.2 09/05/2012 1106   NITRITE NEGATIVE 05/18/2022 1447   LEUKOCYTESUR MODERATE (A) 05/18/2022 1447   Sepsis Labs Recent Labs  Lab 05/16/22 0051 05/18/22 1506 05/19/22 0059  WBC 4.3 6.7 6.9   Microbiology Recent Results (from the past 240 hour(s))  Resp panel by RT-PCR (RSV, Flu A&B, Covid) Anterior Nasal Swab     Status: None   Collection Time: 05/12/22  9:00 PM   Specimen: Anterior Nasal Swab  Result Value Ref Range Status   SARS Coronavirus 2 by RT PCR NEGATIVE NEGATIVE Final   Influenza A by PCR NEGATIVE NEGATIVE Final   Influenza B by PCR NEGATIVE NEGATIVE Final    Comment: (NOTE) The Xpert Xpress SARS-CoV-2/FLU/RSV plus assay is intended as an aid in the diagnosis of influenza from Nasopharyngeal swab specimens and should not be used as a sole basis for treatment. Nasal washings and aspirates are unacceptable for Xpert Xpress SARS-CoV-2/FLU/RSV testing.  Fact Sheet for  Patients: BloggerCourse.com  Fact Sheet for Healthcare Providers: SeriousBroker.it  This test is not yet approved or cleared by the [place] FDA and has been authorized for detection and/or diagnosis of SARS-CoV-2 by FDA under an Emergency Use Authorization (EUA). This EUA will remain in effect (meaning this test can be used) for the duration of the COVID-19 declaration under Section 564(b)(1) of the Act, 21 U.S.C. section 360bbb-3(b)(1), unless the authorization is terminated or revoked.     Resp Syncytial Virus by PCR NEGATIVE NEGATIVE Final    Comment: (NOTE) Fact Sheet for Patients: BloggerCourse.com  Fact Sheet for Healthcare Providers: SeriousBroker.it  This test is not yet approved or cleared by the [place] FDA and has been authorized for detection and/or diagnosis of SARS-CoV-2 by FDA under an Emergency Use Authorization (EUA). This EUA will remain in effect (meaning this test can be used) for the duration of the COVID-19 declaration under Section 564(b)(1) of the Act, 21 U.S.C. section 360bbb-3(b)(1), unless the authorization is terminated or revoked.  Performed at  [facility], [address] [place] [place], [place] [address]   Blood culture (routine x 2)     Status: None   Collection Time: 05/12/22 11:32 PM   Specimen: BLOOD  Result Value Ref Range Status   Specimen Description BLOOD SITE NOT SPECIFIED  Final   Special Requests   Final    BOTTLES DRAWN AEROBIC AND ANAEROBIC Blood Culture results may not be optimal due to an excessive volume of blood received in culture bottles   Culture   Final    NO GROWTH 5 DAYS Performed at [facility], [address], [place], [place] [address]    Report Status 05/18/2022 FINAL  Final  Blood culture (routine x 2)     Status: None   Collection Time: 05/12/22 11:32 PM   Specimen: BLOOD  Result Value Ref  Range Status   Specimen Description BLOOD SITE NOT SPECIFIED  Final   Special Requests   Final    BOTTLES DRAWN AEROBIC AND ANAEROBIC Blood Culture results may not be optimal due to an excessive volume of blood received in culture bottles   Culture   Final    NO GROWTH 5 DAYS Performed at [facility], [address] [place], [place] [address]    Report Status 05/18/2022 FINAL  Final  Urine Culture (for pregnant, neutropenic or urologic patients or patients with an indwelling urinary catheter)     Status: Abnormal   Collection Time: 05/18/22  3:11 PM   Specimen: Urine, Catheterized  Result Value Ref Range Status   Specimen Description URINE, CATHETERIZED  Final   Special Requests   Final    NONE Performed at [facility], [address], [place], [place] [address]    Culture >=100,000 COLONIES/mL ESCHERICHIA COLI (A)  Final   Report Status 05/22/2022 FINAL  Final   Organism ID, Bacteria ESCHERICHIA COLI (A)  Final      Susceptibility   Escherichia coli - MIC*    AMPICILLIN >=32 RESISTANT Resistant     CEFAZOLIN <=4 SENSITIVE Sensitive     CEFEPIME 4 INTERMEDIATE Intermediate     CIPROFLOXACIN <=0.25 SENSITIVE Sensitive     GENTAMICIN <=1 SENSITIVE Sensitive     IMIPENEM <=0.25 SENSITIVE Sensitive     NITROFURANTOIN <=16 SENSITIVE Sensitive     TRIMETH/SULFA <=20 SENSITIVE Sensitive     AMPICILLIN/SULBACTAM >=32 RESISTANT Resistant     PIP/TAZO INTERMEDIATE Intermediate     * >=100,000 COLONIES/mL ESCHERICHIA COLI  SARS Coronavirus 2 by RT PCR (hospital order, performed in [facility] hospital lab) *cepheid single result test* Anterior Nasal Swab     Status: None   Collection Time: 05/21/22  1:03 PM   Specimen: Anterior Nasal Swab  Result Value Ref Range Status   SARS Coronavirus 2 by RT PCR NEGATIVE NEGATIVE Final    Comment: Performed at [facility], [address], [place], [place] [address]     SIGNED:   Marinda Elk, MD  [facility] 05/22/2022, 10:27 AM Pager   If 7PM-7AM, please contact night-coverage [email or web address] Password [email or web address]

## 2022-05-22 NOTE — Plan of Care (Signed)

## 2022-05-22 NOTE — Progress Notes (Addendum)
Physical Therapy Treatment Patient Details Name: James Dyer MRN: [id number] DOB: [birth year] Today's Date: 05/22/2022   History of Present Illness Pt is a 77 y.o. male who presented 05/12/22 s/p fall with AMS. Imaging showed no acute intracranial abnormality, no acute displaced facial fracture, no acute displaced fracture or traumatic listhesis of the C-spine. CT chest/abdomen/pelvis with contrast negative for acute fracture or solid organ injury. Admitted with AKI and hypoglycemia. PMH: PAF on Eliquis, T2DM, HTN, HLD, chronic bilateral foot drop, history of prostate cancer.    PT Comments    Pt received in supine, son present and encouraging, pt agreeable to therapy session with good participation and tolerance for standing exercises for LE strengthening and gait trial. Pt needing Supervision for gait with RW and minA for gait with no AD/HHA, at baseline pt was independent and did not use AD. Pt needing safety cues for environmental awareness with transfers/gait and multimodal cues often needed due to cognitive deficit. Pt with slow processing and notable memory deficits. Pt continues to benefit from PT services to progress toward functional mobility goals.    Recommendations for follow up therapy are one component of a multi-disciplinary discharge planning process, led by the attending physician.  Recommendations may be updated based on patient status, additional functional criteria and insurance authorization.  Follow Up Recommendations  Can patient physically be transported by private vehicle: No    Assistance Recommended at Discharge Frequent or constant Supervision/Assistance  Patient can return home with the following Two people to help with walking and/or transfers;A lot of help with bathing/dressing/bathroom;Assistance with cooking/housework;Direct supervision/assist for financial management;Direct supervision/assist for medications management;Assist for transportation;Help with stairs  or ramp for entrance   Equipment Recommendations  Other (comment) (Per accepting facility)    Recommendations for Other Services       Precautions / Restrictions Precautions Precautions: Fall;Other (comment) Precaution Comments: watch vitals; bil foot drop with braces at baseline Required Braces or Orthoses: Other Brace Other Brace: bil AFOs for OOB mobility Restrictions Weight Bearing Restrictions: No     Mobility  Bed Mobility Overal bed mobility: Modified Independent Bed Mobility: Supine to Sit     Supine to sit: Modified independent (Device/Increase time), HOB elevated     General bed mobility comments: heavy reliance on bed rails/HOB elevated.    Transfers Overall transfer level: Needs assistance Equipment used: Rolling walker (2 wheels) Transfers: Sit to/from Stand Sit to Stand: Supervision, Min guard           General transfer comment: increased time needed to stand into upright position, cues for safe UE placement to stand and min guard for safety with stand>sit due to pt decreased awareness of body in relation to chair.    Ambulation/Gait Ambulation/Gait assistance: Min assist Gait Distance (Feet): 125 Feet Assistive device: None Gait Pattern/deviations: Decreased stride length, Step-through pattern, Trunk flexed, Narrow base of support, Scissoring Gait velocity: decreased, grossly <0.3 m/s     General Gait Details: No LOB using RW for gait initial 75ft. Once pt letting go of RW, pt with narrower BOS and multiple episodes of scissoring causing increased instability. Pt reliant on single HHA on RUE for support with minA throughout remaining 145ft trial. reinforced with pt/son that he needs RW for gait when not working with therapies. Forward head/downward gaze needing frequent cues for looking up. No significant speed changes when encouraged to walk slower/faster.   Stairs         General stair comments: defer, pt plan to DC soon for  rehab.   Wheelchair Mobility    Modified Rankin (Stroke Patients Only)       Balance Overall balance assessment: Needs assistance Sitting-balance support: Single extremity supported, Bilateral upper extremity supported, Feet supported Sitting balance-Leahy Scale: Good     Standing balance support: Bilateral upper extremity supported, Reliant on assistive device for balance Standing balance-Leahy Scale: Fair Standing balance comment: needs single UE support for dynamic standing tasks             High level balance activites: Sudden stops, Turns, Other (comment) (fast/slow speed - no notable change in speed observed)              Cognition Arousal/Alertness: Awake/alert Behavior During Therapy: WFL for tasks assessed/performed Overall Cognitive Status: Impaired/Different from baseline Area of Impairment: Safety/judgement, Following commands, Problem solving                       Following Commands: Follows one step commands with increased time Safety/Judgement: Decreased awareness of deficits   Problem Solving: Slow processing General Comments: Per family his cognition still is not at baseline, pt attempted to explain to PTA where he bought his boots but was unable to state name of the store or location. Pt was able to say "it's like a sporting goods store". Per family, he was very independent, washing his own car and mowing his lawn prior to admission.        Exercises Other Exercises Other Exercises: standing BLE AROM: hip flexion (high knees), mini squats x10 reps ea, tactile and vcs for posture    General Comments General comments (skin integrity, edema, etc.): VSS on RA      Pertinent Vitals/Pain Pain Assessment Faces Pain Scale: No hurt Pain Intervention(s): Monitored during session, Repositioned     PT Goals (current goals can now be found in the care plan section) Acute Rehab PT Goals Patient Stated Goal: did not state; family wishes for  pt to improve cognition so he can be more independent PT Goal Formulation: With patient/family Time For Goal Achievement: 05/27/22 Progress towards PT goals: Progressing toward goals    Frequency    Min 1X/week      PT Plan Current plan remains appropriate       AM-PAC PT "6 Clicks" Mobility   Outcome Measure  Help needed turning from your back to your side while in a flat bed without using bedrails?: None Help needed moving from lying on your back to sitting on the side of a flat bed without using bedrails?: None Help needed moving to and from a bed to a chair (including a wheelchair)?: A Little Help needed standing up from a chair using your arms (e.g., wheelchair or bedside chair)?: A Little Help needed to walk in hospital room?: A Little Help needed climbing 3-5 steps with a railing? : A Lot 6 Click Score: 19    End of Session Equipment Utilized During Treatment: Gait belt Activity Tolerance: Patient tolerated treatment well Patient left: in chair;with call bell/phone within reach;with family/visitor present Nurse Communication: Mobility status PT Visit Diagnosis: Unsteadiness on feet (R26.81);Muscle weakness (generalized) (M62.81);History of falling (Z91.81);Repeated falls (R29.6);Difficulty in walking, not elsewhere classified (R26.2)     Time: 1610-9604 PT Time Calculation (min) (ACUTE ONLY): 24 min  Charges:  $Gait Training: 8-22 mins $Therapeutic Exercise: 8-22 mins                     Abner Ardis P., PTA Acute Rehabilitation Services Secure  Chat Preferred 9a-5:30pm Office: [phone]    Dorathy Kinsman [facility] 05/22/2022, 12:47 PM

## 2022-05-23 ENCOUNTER — Encounter: Payer: Self-pay | Admitting: Adult Health

## 2022-05-23 ENCOUNTER — Non-Acute Institutional Stay (SKILLED_NURSING_FACILITY): Payer: Medicare Other | Admitting: Adult Health

## 2022-05-23 DIAGNOSIS — B9620 Unspecified Escherichia coli [E. coli] as the cause of diseases classified elsewhere: Secondary | ICD-10-CM

## 2022-05-23 DIAGNOSIS — D696 Thrombocytopenia, unspecified: Secondary | ICD-10-CM

## 2022-05-23 DIAGNOSIS — I700 Atherosclerosis of aorta: Secondary | ICD-10-CM | POA: Diagnosis not present

## 2022-05-23 DIAGNOSIS — E11649 Type 2 diabetes mellitus with hypoglycemia without coma: Secondary | ICD-10-CM

## 2022-05-23 DIAGNOSIS — E785 Hyperlipidemia, unspecified: Secondary | ICD-10-CM

## 2022-05-23 DIAGNOSIS — I7121 Aneurysm of the ascending aorta, without rupture: Secondary | ICD-10-CM

## 2022-05-23 DIAGNOSIS — E1169 Type 2 diabetes mellitus with other specified complication: Secondary | ICD-10-CM | POA: Diagnosis not present

## 2022-05-23 DIAGNOSIS — N390 Urinary tract infection, site not specified: Secondary | ICD-10-CM

## 2022-05-23 DIAGNOSIS — E1159 Type 2 diabetes mellitus with other circulatory complications: Secondary | ICD-10-CM | POA: Diagnosis not present

## 2022-05-23 DIAGNOSIS — I480 Paroxysmal atrial fibrillation: Secondary | ICD-10-CM | POA: Diagnosis not present

## 2022-05-23 DIAGNOSIS — I152 Hypertension secondary to endocrine disorders: Secondary | ICD-10-CM

## 2022-05-23 DIAGNOSIS — N179 Acute kidney failure, unspecified: Secondary | ICD-10-CM | POA: Diagnosis not present

## 2022-05-23 DIAGNOSIS — R3915 Urgency of urination: Secondary | ICD-10-CM | POA: Diagnosis not present

## 2022-05-23 DIAGNOSIS — R338 Other retention of urine: Secondary | ICD-10-CM

## 2022-05-23 NOTE — Progress Notes (Signed)
Location:  [facility] Room Number: 132 Place of Service:  SNF (31)   CODE STATUS: full   Allergies  Allergen Reactions   Latex Other (See Comments)    "sores in mouth after teeth cleaned"    Chief Complaint  Patient presents with   Hospitalization Follow-up    HPI:  He is a 77 year old man who has been hospitalized from 05-12-22 through 05-22-22. His medical history includes: atrial fibrillation; diabetes mellitus type 2; history of prostate cancer. He presented to the ED after falling at home; acute kidney injury; and altered mental status. His admission creat was 1.4; and mild rhabdomyolysis which improved with IVF.  He had urinary retention had a foley placed. He had a urine culture positive for e-coli treated with bactrim will need 7 more days of therapy. His foley was removed without incident. He is here for short term rehab with his goal to return back home. He will continue to be followed for his chronic illnesses including:   Urinary urgency: Thrombocytopenia:  Type 2 diabetes with hypoglycemia without coma without long term current use of insulin:  Hyperlipidemia associated with type 2 diabetes mellitus:    Past Medical History:  Diagnosis Date   Arthritis    HNP- lumbar   Borderline diabetes    Cancer (HCC) 2013   prostate , treated /w radiation    Diabetes mellitus without complication (HCC)    Foot drop, bilateral since birthpt wears braces in shoes   pt must have braces to walk   Hepatitis    "pt told had been exposed to hepatitis when he went to donate blood"   History of kidney stones    History of radiation therapy 04/12/11 - 06/06/11   prostate, seminal vesicles   History of stress test    Echocardiogram done - 03/2013   Hypercholesterolemia    states Dr. Renette Butters Rx Welchol- pt. hs stopped taking because he believe it gives him  leg cramps.   Hypertension    Nephrolithiasis    renal calculi- , also has been told that he has a cyst on  kidney - R side    Neuromuscular disorder (HCC)    bilateral foot drop- both feet    Paroxysmal atrial fibrillation (HCC)     Past Surgical History:  Procedure Laterality Date   BACK SURGERY     CARDIOVERSION N/A 02/28/2021   Procedure: CARDIOVERSION;  Surgeon: Thurmon Fair, MD;  [facility]: [facility];  Service: Cardiovascular;  Laterality: N/A;   COLONOSCOPY N/A 07/22/2012   Procedure: COLONOSCOPY;  Surgeon: Dalia Heading, MD;  [facility];  Service: Gastroenterology;  Laterality: N/A;   ESOPHAGEAL DILATION N/A 04/22/2019   Procedure: ESOPHAGEAL DILATION;  Surgeon: Malissa Hippo, MD;  [facility]: [facility];  Service: Endoscopy;  Laterality: N/A;   ESOPHAGOGASTRODUODENOSCOPY N/A 04/22/2019   Procedure: ESOPHAGOGASTRODUODENOSCOPY (EGD);  Surgeon: Malissa Hippo, MD;  [facility]: [facility];  Service: Endoscopy;  Laterality: N/A;  145   EXTRACORPOREAL SHOCK WAVE LITHOTRIPSY Left 03/04/2017   Procedure: LEFT EXTRACORPOREAL SHOCK WAVE LITHOTRIPSY (ESWL);  Surgeon: Rene Paci, MD;  Location: [facility];  Service: Urology;  Laterality: Left;   FOOT SURGERY Right 1980's   calcium deposit removed   LUMBAR LAMINECTOMY/DECOMPRESSION MICRODISCECTOMY Right 09/01/2013   Procedure: RIGHT LUMBAR FOUR-FIVE LAMINECTOMY/DECOMPRESSION MICRODISCECTOMY 1 LEVEL;  Surgeon: Coletta Memos, MD;  [facility]: [facility];  Service: Neurosurgery;  Laterality: Right;  Right L45 microdiskectomy   prostate  biopsy  2013   SHOULDER OPEN ROTATOR CUFF REPAIR Left 09/09/2012   Procedure: LEFT ROTATOR CUFF REPAIR SHOULDER OPEN;  Surgeon: Jacki Cones, MD;  Location: [facility];  Service: Orthopedics;  Laterality: Left;    Social History   Socioeconomic History   Marital status: Married    Spouse name: Not on file   Number of children: Not on file   Years of education: Not on file   Highest education level: Not on file  Occupational History   Not on file  Tobacco Use   Smoking status: Former     Packs/day: 1.00    Years: 5.00    Additional pack years: 0.00    Total pack years: 5.00    Types: Cigarettes    Quit date: 12/18/1966    Years since quitting: 55.4   Smokeless tobacco: Never  Vaping Use   Vaping Use: Never used  Substance and Sexual Activity   Alcohol use: Yes    Comment: occasional beer   Drug use: No   Sexual activity: Never  Other Topics Concern   Not on file  Social History Narrative   Not on file   Social Determinants of Health   Financial Resource Strain: Not on file  Food Insecurity: No Food Insecurity (05/13/2022)   Hunger Vital Sign    Worried About Running Out of Food in the Last Year: Never true    Ran Out of Food in the Last Year: Never true  Transportation Needs: No Transportation Needs (05/13/2022)   PRAPARE - Administrator, Civil Service (Medical): No    Lack of Transportation (Non-Medical): No  Physical Activity: Not on file  Stress: Not on file  Social Connections: Not on file  Intimate Partner Violence: Not At Risk (05/13/2022)   Humiliation, Afraid, Rape, and Kick questionnaire    Fear of Current or Ex-Partner: No    Emotionally Abused: No    Physically Abused: No    Sexually Abused: No   No family history on file.    VITAL SIGNS BP (!) 178/70   Pulse 80   Temp (!) 97.4 F (36.3 C)   Resp 20   Ht 5\' 9"  (1.753 m)   Wt 233 lb 6.4 oz (105.9 kg)   SpO2 98%   BMI 34.47 kg/m   Outpatient Encounter Medications as of 05/23/2022  Medication Sig   apixaban (ELIQUIS) 5 MG TABS tablet Take 1 tablet (5 mg total) by mouth 2 (two) times daily.   ezetimibe (ZETIA) 10 MG tablet Take 1 tablet (10 mg total) by mouth daily.   Lidocaine HCl (PAIN RELIEF ROLL-ON EX) Apply 1 application topically daily as needed (applied to feet if needed for pain.).   metoprolol tartrate (LOPRESSOR) 25 MG tablet TAKE 1 TABLET(25 MG) BY MOUTH TWICE DAILY   solifenacin (VESICARE) 10 MG tablet Take 1 tablet (10 mg total) by mouth daily.    tamsulosin (FLOMAX) 0.4 MG CAPS capsule Take 1 capsule (0.4 mg total) by mouth daily after breakfast.   No facility-administered encounter medications on file as of 05/23/2022.     SIGNIFICANT DIAGNOSTIC EXAMS  TODAY  05-12-22: ct maxillofacial; head; cervical spine 1. No acute intracranial abnormality. 2.  No acute displaced facial fracture. 3. No acute displaced fracture or traumatic listhesis of the cervical spine. 4. Likely mild pulmonary edema.  05-12-22: ct of chest abdomen and pelvis:  1. No evidence of acute fracture or solid organ injury. 2. Aortic atherosclerosis with aneurysmal dilatation  of the ascending aorta measuring 4.1 cm. Recommend annual imaging follow up  Aortic aneurysm  3. Coronary artery calcifications.  05-13-22: mri of brain 1. No acute intracranial abnormality. 2. Evolving right frontal scalp contusion/hematoma. 3. Age-related cerebral atrophy with moderate to advanced chronic microvascular ischemic disease.  LABS REVIEWED:   05-12-22: wbc 4.0; hgb 14.3; hct 43.1; mcv 88.5 plt 106; glucose 71; bun 32; creat 1.49; k+ 3.6; na++ 135; ca 8.3 gfr 48; protein 6.4 albumin 3.6; blood culture no growth 05-14-22; wbc 4.0; hgb 14.8; hct 44.0; mcv 87.1 plt 69; glucose 115; bun 13; creat 1.00; k+ 3.8; na++ 132; ca 7.9; gfr >60; tsh 2.324; vitamin B 12; 377 folate 26.3 05-19-22: wbc 6.9; hgb 12.6; hct 40.5; mcv 94.4 plt 157; glucose 213; bun 20; creat 0.94; k+ 3.6 na++ 137; ca 8.3 gfr >60   Review of Systems  Constitutional:  Negative for malaise/fatigue.  Respiratory:  Negative for cough and shortness of breath.   Cardiovascular:  Negative for chest pain, palpitations and leg swelling.  Gastrointestinal:  Negative for abdominal pain, constipation and heartburn.  Musculoskeletal:  Negative for back pain, joint pain and myalgias.  Skin: Negative.   Neurological:  Negative for dizziness.  Psychiatric/Behavioral:  The patient is not nervous/anxious.    Physical  Exam Constitutional:      General: He is not in acute distress.    Appearance: He is well-developed. He is not diaphoretic.  Neck:     Thyroid: No thyromegaly.  Cardiovascular:     Rate and Rhythm: Normal rate and regular rhythm.     Pulses: Normal pulses.     Heart sounds: Normal heart sounds.  Pulmonary:     Effort: Pulmonary effort is normal. No respiratory distress.     Breath sounds: Normal breath sounds.  Abdominal:     General: Bowel sounds are normal. There is no distension.     Palpations: Abdomen is soft.     Tenderness: There is no abdominal tenderness.  Musculoskeletal:        General: Normal range of motion.     Cervical back: Neck supple.  Lymphadenopathy:     Cervical: No cervical adenopathy.  Skin:    General: Skin is warm and dry.  Neurological:     Mental Status: He is alert and oriented to person, place, and time.  Psychiatric:        Mood and Affect: Mood normal.       ASSESSMENT/ PLAN:  TODAY  E-coli UTI: acute kidney injury: is improved will complete bactrim and will monitor his status.   2. Acute urine retention: has had foley removed and is voiding; will continue flomax 0.4 mg daily   3.  Urinary urgency: will continue vesicare 10 mg daily  4. Thrombocytopenia: plt as low as 69 now at 157  5. Type 2 diabetes with hypoglycemia without coma without long term current use of insulin: hgb A1c 5.9 his amaryl was stopped in the hospital  6. Hyperlipidemia associated with type 2 diabetes mellitus: will continue zetia 10 mg daily   7. Hypertension associated with type 2 diabetes mellitus: b/p 140 /80 recheck: will continue lopressor 25 mg twice daily   8. Paroxsymal atrial fibrillation: heart rate stable will continue lopressor 25 mg twice daily for rate control and eliquis 5 mg twice daily   9. Aortic atherosclerosis /aneurysm of ascending aorta without rupture: (ct 05-12-22)       Synthia Innocent NP [facility]  call [phone]

## 2022-05-28 DIAGNOSIS — Z7901 Long term (current) use of anticoagulants: Secondary | ICD-10-CM | POA: Insufficient documentation

## 2022-05-29 ENCOUNTER — Encounter: Payer: Self-pay | Admitting: Adult Health

## 2022-05-29 ENCOUNTER — Other Ambulatory Visit: Payer: Self-pay | Admitting: Adult Health

## 2022-05-29 ENCOUNTER — Non-Acute Institutional Stay (SKILLED_NURSING_FACILITY): Payer: Medicare Other | Admitting: Adult Health

## 2022-05-29 DIAGNOSIS — I700 Atherosclerosis of aorta: Secondary | ICD-10-CM | POA: Diagnosis not present

## 2022-05-29 DIAGNOSIS — I152 Hypertension secondary to endocrine disorders: Secondary | ICD-10-CM | POA: Insufficient documentation

## 2022-05-29 DIAGNOSIS — M6281 Muscle weakness (generalized): Secondary | ICD-10-CM | POA: Diagnosis not present

## 2022-05-29 DIAGNOSIS — Z9181 History of falling: Secondary | ICD-10-CM | POA: Diagnosis not present

## 2022-05-29 DIAGNOSIS — G9341 Metabolic encephalopathy: Secondary | ICD-10-CM | POA: Diagnosis not present

## 2022-05-29 DIAGNOSIS — N200 Calculus of kidney: Secondary | ICD-10-CM | POA: Diagnosis not present

## 2022-05-29 DIAGNOSIS — M21371 Foot drop, right foot: Secondary | ICD-10-CM | POA: Diagnosis not present

## 2022-05-29 DIAGNOSIS — N3281 Overactive bladder: Secondary | ICD-10-CM | POA: Diagnosis not present

## 2022-05-29 DIAGNOSIS — I10 Essential (primary) hypertension: Secondary | ICD-10-CM | POA: Diagnosis not present

## 2022-05-29 DIAGNOSIS — E7800 Pure hypercholesterolemia, unspecified: Secondary | ICD-10-CM | POA: Diagnosis not present

## 2022-05-29 DIAGNOSIS — N390 Urinary tract infection, site not specified: Secondary | ICD-10-CM | POA: Diagnosis not present

## 2022-05-29 DIAGNOSIS — I7121 Aneurysm of the ascending aorta, without rupture: Secondary | ICD-10-CM | POA: Insufficient documentation

## 2022-05-29 DIAGNOSIS — G709 Myoneural disorder, unspecified: Secondary | ICD-10-CM | POA: Diagnosis not present

## 2022-05-29 DIAGNOSIS — I480 Paroxysmal atrial fibrillation: Secondary | ICD-10-CM

## 2022-05-29 DIAGNOSIS — E1159 Type 2 diabetes mellitus with other circulatory complications: Secondary | ICD-10-CM | POA: Insufficient documentation

## 2022-05-29 DIAGNOSIS — R350 Frequency of micturition: Secondary | ICD-10-CM

## 2022-05-29 DIAGNOSIS — M1990 Unspecified osteoarthritis, unspecified site: Secondary | ICD-10-CM | POA: Diagnosis not present

## 2022-05-29 DIAGNOSIS — Z87891 Personal history of nicotine dependence: Secondary | ICD-10-CM | POA: Diagnosis not present

## 2022-05-29 DIAGNOSIS — D696 Thrombocytopenia, unspecified: Secondary | ICD-10-CM | POA: Diagnosis not present

## 2022-05-29 DIAGNOSIS — R339 Retention of urine, unspecified: Secondary | ICD-10-CM | POA: Diagnosis not present

## 2022-05-29 DIAGNOSIS — Z7901 Long term (current) use of anticoagulants: Secondary | ICD-10-CM | POA: Diagnosis not present

## 2022-05-29 DIAGNOSIS — R3915 Urgency of urination: Secondary | ICD-10-CM | POA: Insufficient documentation

## 2022-05-29 DIAGNOSIS — M21372 Foot drop, left foot: Secondary | ICD-10-CM | POA: Diagnosis not present

## 2022-05-29 DIAGNOSIS — E1169 Type 2 diabetes mellitus with other specified complication: Secondary | ICD-10-CM | POA: Insufficient documentation

## 2022-05-29 DIAGNOSIS — E11649 Type 2 diabetes mellitus with hypoglycemia without coma: Secondary | ICD-10-CM | POA: Diagnosis not present

## 2022-05-29 DIAGNOSIS — N1330 Unspecified hydronephrosis: Secondary | ICD-10-CM | POA: Diagnosis not present

## 2022-05-29 DIAGNOSIS — B9620 Unspecified Escherichia coli [E. coli] as the cause of diseases classified elsewhere: Secondary | ICD-10-CM | POA: Insufficient documentation

## 2022-05-29 DIAGNOSIS — N179 Acute kidney failure, unspecified: Secondary | ICD-10-CM | POA: Diagnosis not present

## 2022-05-29 MED ORDER — EZETIMIBE 10 MG PO TABS: 10.0000 mg | ORAL_TABLET | Freq: Every day | ORAL | 0 refills | Status: AC

## 2022-05-29 MED ORDER — TAMSULOSIN HCL 0.4 MG PO CAPS: 0.4000 mg | ORAL_CAPSULE | Freq: Every day | ORAL | 0 refills | Status: DC

## 2022-05-29 MED ORDER — METOPROLOL TARTRATE 25 MG PO TABS: ORAL_TABLET | ORAL | 0 refills | Status: DC

## 2022-05-29 MED ORDER — APIXABAN 5 MG PO TABS: 5.0000 mg | ORAL_TABLET | Freq: Two times a day (BID) | ORAL | 0 refills | Status: DC

## 2022-05-29 MED ORDER — SOLIFENACIN SUCCINATE 10 MG PO TABS: 10.0000 mg | ORAL_TABLET | Freq: Every day | ORAL | 0 refills | Status: DC

## 2022-05-29 NOTE — Progress Notes (Unsigned)
Location:  [facility] Room Number: 132-P Place of Service:  SNF (31)   CODE STATUS: DNR  Allergies  Allergen Reactions   Latex Other (See Comments)    "sores in mouth after teeth cleaned"    Chief Complaint  Patient presents with   Discharge Note    HPI:  He is being discharged to with home health for pt/ot. He will need his prescriptions written and will need to be seen by his medical provider. He does not need any dme. He had been hospitalized after a fall at home had acute kidney injury and altered mental status. He was admitted to this facility for short term rehab. He has participated in therapy to improve upon his level of independence with his adls. And is ready for discharge to home.   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 Renette Butters Rx Welchol- pt. hs stopped taking because he believe it gives him  leg cramps.   Hypertension    Nephrolithiasis    renal calculi- , also has been told that he has a cyst on kidney - R side    Neuromuscular disorder (HCC)    bilateral foot drop- both feet    Paroxysmal atrial fibrillation (HCC)     Past Surgical History:  Procedure Laterality Date   BACK SURGERY     CARDIOVERSION N/A 02/28/2021   Procedure: CARDIOVERSION;  Surgeon: Thurmon Fair, MD;  [facility]: [facility];  Service: Cardiovascular;  Laterality: N/A;   COLONOSCOPY N/A 07/22/2012   Procedure: COLONOSCOPY;  Surgeon: Dalia Heading, MD;  [facility];  Service: Gastroenterology;  Laterality: N/A;   ESOPHAGEAL DILATION N/A 04/22/2019   Procedure: ESOPHAGEAL DILATION;  Surgeon: Malissa Hippo, MD;  [facility];  Service: Endoscopy;  Laterality: N/A;   ESOPHAGOGASTRODUODENOSCOPY N/A 04/22/2019   Procedure: ESOPHAGOGASTRODUODENOSCOPY (EGD);  Surgeon: Malissa Hippo, MD;  [facility];  Service: Endoscopy;  Laterality: N/A;  145   EXTRACORPOREAL SHOCK WAVE LITHOTRIPSY Left 03/04/2017   Procedure: LEFT EXTRACORPOREAL SHOCK WAVE LITHOTRIPSY (ESWL);  Surgeon: Rene Paci, MD;  Location: [facility];  Service: Urology;  Laterality: Left;   FOOT SURGERY Right 1980's   calcium deposit removed   LUMBAR LAMINECTOMY/DECOMPRESSION MICRODISCECTOMY Right 09/01/2013   Procedure: RIGHT LUMBAR FOUR-FIVE LAMINECTOMY/DECOMPRESSION MICRODISCECTOMY 1 LEVEL;  Surgeon: Coletta Memos, MD;  [facility]: [facility];  Service: Neurosurgery;  Laterality: Right;  Right L45 microdiskectomy   prostate biopsy  2013   SHOULDER OPEN ROTATOR CUFF REPAIR Left 09/09/2012   Procedure: LEFT ROTATOR CUFF REPAIR SHOULDER OPEN;  Surgeon: Jacki Cones, MD;  Location: [facility];  Service: Orthopedics;  Laterality: Left;    Social History   Socioeconomic History   Marital status: Married    Spouse name: Not on file   Number of children: Not  on file   Years of education: Not on file   Highest education level: Not on file  Occupational History   Not on file  Tobacco Use   Smoking status: Former    Packs/day: 1.00    Years: 5.00    Additional pack years: 0.00    Total pack years: 5.00    Types: Cigarettes    Quit date: 12/18/1966    Years since quitting: 55.4   Smokeless tobacco: Never  Vaping Use   Vaping Use: Never used  Substance and Sexual Activity   Alcohol use: Yes    Comment: occasional beer   Drug use: No   Sexual activity: Never  Other Topics Concern   Not on file  Social History Narrative    Not on file   Social Determinants of Health   Financial Resource Strain: Not on file  Food Insecurity: No Food Insecurity (05/13/2022)   Hunger Vital Sign    Worried About Running Out of Food in the Last Year: Never true    Ran Out of Food in the Last Year: Never true  Transportation Needs: No Transportation Needs (05/13/2022)   PRAPARE - Administrator, Civil Service (Medical): No    Lack of Transportation (Non-Medical): No  Physical Activity: Not on file  Stress: Not on file  Social Connections: Not on file  Intimate Partner Violence: Not At Risk (05/13/2022)   Humiliation, Afraid, Rape, and Kick questionnaire    Fear of Current or Ex-Partner: No    Emotionally Abused: No    Physically Abused: No    Sexually Abused: No   History reviewed. No pertinent family history.    VITAL SIGNS BP (!) 111/52   Pulse 67   Temp 98 F (36.7 C)   Resp 20   Ht 5\' 9"  (1.753 m)   Wt 226 lb 9.6 oz (102.8 kg)   SpO2 97%   BMI 33.46 kg/m   Outpatient Encounter Medications as of 05/29/2022  Medication Sig   apixaban (ELIQUIS) 5 MG TABS tablet Take 1 tablet (5 mg total) by mouth 2 (two) times daily.   ezetimibe (ZETIA) 10 MG tablet Take 1 tablet (10 mg total) by mouth daily.   metoprolol tartrate (LOPRESSOR) 25 MG tablet TAKE 1 TABLET(25 MG) BY MOUTH TWICE DAILY   solifenacin (VESICARE) 10 MG tablet Take 1 tablet (10 mg total) by mouth daily.   tamsulosin (FLOMAX) 0.4 MG CAPS capsule Take 1 capsule (0.4 mg total) by mouth daily after breakfast.   Lidocaine HCl (PAIN RELIEF ROLL-ON EX) Apply 1 application topically daily as needed (applied to feet if needed for pain.). (Patient not taking: Reported on 05/29/2022)   No facility-administered encounter medications on file as of 05/29/2022.     SIGNIFICANT DIAGNOSTIC EXAMS  TODAY  05-12-22: ct maxillofacial; head; cervical spine 1. No acute intracranial abnormality. 2.  No acute displaced facial fracture. 3. No acute displaced  fracture or traumatic listhesis of the cervical spine. 4. Likely mild pulmonary edema.  05-12-22: ct of chest abdomen and pelvis:  1. No evidence of acute fracture or solid organ injury. 2. 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 Recommend annual imaging follow up  Aortic aneurysm  3. Coronary artery calcifications.  05-13-22: mri of brain 1. No acute intracranial abnormality. 2. Evolving right frontal scalp contusion/hematoma. 3. Age-related cerebral atrophy with moderate to advanced chronic microvascular ischemic disease.  LABS REVIEWED:   05-12-22: wbc 4.0; hgb 14.3; hct  43.1; mcv 88.5 plt 106; glucose 71; bun 32; creat 1.49; k+ 3.6; na++ 135; ca 8.3 gfr 48; protein 6.4 albumin 3.6; blood culture no growth 05-14-22; wbc 4.0; hgb 14.8; hct 44.0; mcv 87.1 plt 69; glucose 115; bun 13; creat 1.00; k+ 3.8; na++ 132; ca 7.9; gfr >60; tsh 2.324; vitamin B 12; 377 folate 26.3 05-19-22: wbc 6.9; hgb 12.6; hct 40.5; mcv 94.4 plt 157; glucose 213; bun 20; creat 0.94; k+ 3.6 na++ 137; ca 8.3 gfr >60  Review of Systems  Constitutional:  Negative for malaise/fatigue.  Respiratory:  Negative for cough and shortness of breath.   Cardiovascular:  Negative for chest pain, palpitations and leg swelling.  Gastrointestinal:  Negative for abdominal pain, constipation and heartburn.  Musculoskeletal:  Negative for back pain, joint pain and myalgias.  Skin: Negative.   Neurological:  Negative for dizziness.  Psychiatric/Behavioral:  The patient is not nervous/anxious.     Physical Exam Constitutional:      General: He is not in acute distress.    Appearance: He is well-developed. He is not diaphoretic.  Neck:     Thyroid: No thyromegaly.  Cardiovascular:     Rate and Rhythm: Normal rate and regular rhythm.     Pulses: Normal pulses.     Heart sounds: Normal heart sounds.  Pulmonary:     Effort: Pulmonary effort is normal. No respiratory distress.      Breath sounds: Normal breath sounds.  Abdominal:     General: Bowel sounds are normal. There is no distension.     Palpations: Abdomen is soft.     Tenderness: There is no abdominal tenderness.  Musculoskeletal:        General: Normal range of motion.     Cervical back: Neck supple.     Right lower leg: No edema.     Left lower leg: No edema.  Lymphadenopathy:     Cervical: No cervical adenopathy.  Skin:    General: Skin is warm and dry.  Neurological:     Mental Status: He is alert and oriented to person, place, and time.  Psychiatric:        Mood and Affect: Mood normal.       ASSESSMENT/ PLAN:   Patient is being discharged with the following home health services:  pt/ot to evaluate and treat as indicated for gait balance strength adl training   Patient is being discharged with the following durable medical equipment:  none needed   Patient has been advised to f/u with their PCP in 1-2 weeks to for a transitions of care visit.  Social services at their facility was responsible for arranging this appointment.  Pt was provided with adequate prescriptions of noncontrolled medications to reach the scheduled appointment .  For controlled substances, a limited supply was provided as appropriate for the individual patient.  If the pt normally receives these medications from a pain clinic or has a contract with another physician, these medications should be received from that clinic or physician only).    A 30 day supply of his prescription medications have been sent to: [facility]  Time spent with patient: 40 minutes: dme; medications; home health   Synthia Innocent NP [facility]  call [phone]

## 2022-06-04 ENCOUNTER — Other Ambulatory Visit: Payer: Self-pay | Admitting: *Deleted

## 2022-06-04 DIAGNOSIS — I10 Essential (primary) hypertension: Secondary | ICD-10-CM

## 2022-06-04 NOTE — Patient Outreach (Signed)
Per [facility] James Dyer discharged from [facility] on 05/29/22. Screening for potential [facility] care coordination services as benefit of health plan and  Primary Care Provider.  Verified with James Dyer social worker [facility] was arranged.   Telephone call made to James Dyer at [phone] to discuss [facility] care coordination services. Patient identifiers confirmed. James Dyer confirms [facility] home [facility] came to seem him last Friday. States they are supposed to contact him today as well. PCP appointment is scheduled for tomorrow.   James Dyer states his primary concern is his blood sugars. States they have been on the lower side. Plans to discuss further with PCP tomorrow.   Referral made for RN [facility] care coordination services.   James Noble, MSN, RN,BSN [facility] Care Coordinator [phone] (Direct dial)

## 2022-06-05 ENCOUNTER — Telehealth: Payer: Self-pay | Admitting: *Deleted

## 2022-06-05 DIAGNOSIS — S0990XA Unspecified injury of head, initial encounter: Secondary | ICD-10-CM | POA: Diagnosis not present

## 2022-06-05 DIAGNOSIS — E6609 Other obesity due to excess calories: Secondary | ICD-10-CM | POA: Diagnosis not present

## 2022-06-05 DIAGNOSIS — E11649 Type 2 diabetes mellitus with hypoglycemia without coma: Secondary | ICD-10-CM | POA: Diagnosis not present

## 2022-06-05 DIAGNOSIS — I714 Abdominal aortic aneurysm, without rupture, unspecified: Secondary | ICD-10-CM | POA: Diagnosis not present

## 2022-06-05 DIAGNOSIS — Z6834 Body mass index (BMI) 34.0-34.9, adult: Secondary | ICD-10-CM | POA: Diagnosis not present

## 2022-06-05 DIAGNOSIS — S0631AA Contusion and laceration of right cerebrum with loss of consciousness status unknown, initial encounter: Secondary | ICD-10-CM | POA: Diagnosis not present

## 2022-06-05 NOTE — Progress Notes (Signed)
  Care Coordination   Note   06/05/2022 Name: James Dyer MRN: [id number] DOB: Jan 15, 1946  James Dyer is a 77 y.o. year old male who sees [name], MD for primary care. I reached out to [name] by phone today to offer care coordination services.  [name] was given information about Care Coordination services today including:   The Care Coordination services include support from the care team which includes your Nurse Coordinator, Clinical Social Worker, or Pharmacist.  The Care Coordination team is here to help remove barriers to the health concerns and goals most important to you. Care Coordination services are voluntary, and the patient may decline or stop services at any time by request to their care team member.   Care Coordination Consent Status: Patient agreed to services and verbal consent obtained.   Follow up plan:  Telephone appointment with care coordination team member scheduled for:  06/11/22  Encounter Outcome:  Pt. Scheduled  [facility] Coordination Care Guide  Direct Dial: [phone]

## 2022-06-06 DIAGNOSIS — M21371 Foot drop, right foot: Secondary | ICD-10-CM | POA: Diagnosis not present

## 2022-06-06 DIAGNOSIS — I480 Paroxysmal atrial fibrillation: Secondary | ICD-10-CM | POA: Diagnosis not present

## 2022-06-06 DIAGNOSIS — N390 Urinary tract infection, site not specified: Secondary | ICD-10-CM | POA: Diagnosis not present

## 2022-06-06 DIAGNOSIS — G9341 Metabolic encephalopathy: Secondary | ICD-10-CM | POA: Diagnosis not present

## 2022-06-06 DIAGNOSIS — N179 Acute kidney failure, unspecified: Secondary | ICD-10-CM | POA: Diagnosis not present

## 2022-06-06 DIAGNOSIS — M6281 Muscle weakness (generalized): Secondary | ICD-10-CM | POA: Diagnosis not present

## 2022-06-11 ENCOUNTER — Encounter: Payer: Self-pay | Admitting: *Deleted

## 2022-06-11 ENCOUNTER — Ambulatory Visit: Payer: Self-pay | Admitting: *Deleted

## 2022-06-12 ENCOUNTER — Encounter: Payer: Self-pay | Admitting: Nurse Practitioner

## 2022-06-12 ENCOUNTER — Telehealth: Payer: Self-pay | Admitting: Cardiology

## 2022-06-12 ENCOUNTER — Other Ambulatory Visit (INDEPENDENT_AMBULATORY_CARE_PROVIDER_SITE_OTHER): Payer: Medicare Other

## 2022-06-12 ENCOUNTER — Ambulatory Visit: Payer: Medicare Other | Attending: Nurse Practitioner | Admitting: Nurse Practitioner

## 2022-06-12 ENCOUNTER — Telehealth: Payer: Self-pay | Admitting: Cardiovascular Disease

## 2022-06-12 ENCOUNTER — Other Ambulatory Visit: Payer: Self-pay | Admitting: Nurse Practitioner

## 2022-06-12 VITALS — BP 112/64 | HR 68 | Ht 69.0 in | Wt 241.2 lb

## 2022-06-12 DIAGNOSIS — I7121 Aneurysm of the ascending aorta, without rupture: Secondary | ICD-10-CM | POA: Diagnosis not present

## 2022-06-12 DIAGNOSIS — Z9181 History of falling: Secondary | ICD-10-CM | POA: Diagnosis not present

## 2022-06-12 DIAGNOSIS — R55 Syncope and collapse: Secondary | ICD-10-CM

## 2022-06-12 DIAGNOSIS — I10 Essential (primary) hypertension: Secondary | ICD-10-CM | POA: Diagnosis not present

## 2022-06-12 DIAGNOSIS — I4819 Other persistent atrial fibrillation: Secondary | ICD-10-CM | POA: Diagnosis not present

## 2022-06-12 DIAGNOSIS — Z79899 Other long term (current) drug therapy: Secondary | ICD-10-CM | POA: Diagnosis not present

## 2022-06-12 DIAGNOSIS — R42 Dizziness and giddiness: Secondary | ICD-10-CM | POA: Insufficient documentation

## 2022-06-12 DIAGNOSIS — R5383 Other fatigue: Secondary | ICD-10-CM

## 2022-06-12 DIAGNOSIS — E785 Hyperlipidemia, unspecified: Secondary | ICD-10-CM | POA: Diagnosis not present

## 2022-06-12 MED ORDER — LOSARTAN POTASSIUM 50 MG PO TABS: 50.0000 mg | ORAL_TABLET | Freq: Every day | ORAL | 6 refills | Status: DC

## 2022-06-12 NOTE — Telephone Encounter (Signed)
Received page overnight about iRhythm result from cardiac monitor of patient: showed atrial fibrillation 58-81bpm x90 seconds at 4:48pm on 06/12/22. Appears to already be on apixaban based on medication list. Will route this message to his primary cardiologist, Dr. Eden Emms, to let know of the result.   Bella Kennedy, MD Cardiology 06/12/2022

## 2022-06-12 NOTE — Progress Notes (Signed)
Office Visit    Patient Name: James Dyer Date of Encounter: 06/12/2022  PCP:  [name], MD   [facility]  Cardiologist:  Charlton Haws, MD Advanced Practice Provider:  No care team member to display Electrophysiologist:  None   Chief Complaint    James Dyer is a 77 y.o. male with a hx of persistent A-fib, hypertension, hyperlipidemia, type 2 diabetes, GERD, dysphagia, hx of prostate cancer, and chronic neuromuscular symptoms and bilateral foot drop, who presents today for hospital follow-up.  Past Medical History    Past Medical History:  Diagnosis Date   Arthritis    HNP- lumbar   Borderline diabetes    Cancer (HCC) 2013   prostate , treated /w radiation    Diabetes mellitus without complication (HCC)    Foot drop, bilateral since birthpt wears braces in shoes   pt must have braces to walk   Hepatitis    "pt told had been exposed to hepatitis when he went to donate blood"   History of kidney stones    History of radiation therapy 04/12/11 - 06/06/11   prostate, seminal vesicles   History of stress test    Echocardiogram done - 03/2013   Hypercholesterolemia    states Dr. Renette Butters Rx Welchol- pt. hs stopped taking because he believe it gives him  leg cramps.   Hypertension    Nephrolithiasis    renal calculi- , also has been told that he has a cyst on kidney - R side    Neuromuscular disorder (HCC)    bilateral foot drop- both feet    Paroxysmal atrial fibrillation (HCC)    Past Surgical History:  Procedure Laterality Date   BACK SURGERY     CARDIOVERSION N/A 02/28/2021   Procedure: CARDIOVERSION;  Surgeon: Thurmon Fair, MD;  Location: [facility];  Service: Cardiovascular;  Laterality: N/A;   COLONOSCOPY N/A 07/22/2012   Procedure: COLONOSCOPY;  Surgeon: Dalia Heading, MD;  Location: [facility];  Service: Gastroenterology;  Laterality: N/A;   ESOPHAGEAL DILATION N/A 04/22/2019   Procedure: ESOPHAGEAL DILATION;  Surgeon:  Malissa Hippo, MD;  Location: [facility];  Service: Endoscopy;  Laterality: N/A;   ESOPHAGOGASTRODUODENOSCOPY N/A 04/22/2019   Procedure: ESOPHAGOGASTRODUODENOSCOPY (EGD);  Surgeon: Malissa Hippo, MD;  Location: [facility];  Service: Endoscopy;  Laterality: N/A;  145   EXTRACORPOREAL SHOCK WAVE LITHOTRIPSY Left 03/04/2017   Procedure: LEFT EXTRACORPOREAL SHOCK WAVE LITHOTRIPSY (ESWL);  Surgeon: Rene Paci, MD;  Location: [facility];  Service: Urology;  Laterality: Left;   FOOT SURGERY Right 1980's   calcium deposit removed   LUMBAR LAMINECTOMY/DECOMPRESSION MICRODISCECTOMY Right 09/01/2013   Procedure: RIGHT LUMBAR FOUR-FIVE LAMINECTOMY/DECOMPRESSION MICRODISCECTOMY 1 LEVEL;  Surgeon: Coletta Memos, MD;  Location: [facility];  Service: Neurosurgery;  Laterality: Right;  Right L45 microdiskectomy   prostate biopsy  2013   SHOULDER OPEN ROTATOR CUFF REPAIR Left 09/09/2012   Procedure: LEFT ROTATOR CUFF REPAIR SHOULDER OPEN;  Surgeon: Jacki Cones, MD;  Location: [facility];  Service: Orthopedics;  Laterality: Left;    Allergies  Allergies  Allergen Reactions   Latex Other (See Comments)    "sores in mouth after teeth cleaned"    History of Present Illness    James Dyer is a 77 y.o. male with a PMH as mentioned above.  Previous cardiovascular history includes no known CAD, was found to have nonischemic Myoview in 2015.  Found to be in asymptomatic A-fib  in 2021, was started on Lopressor and Eliquis.  Echocardiogram revealed EF 50 to 55%, no significant valvular disease.  Did undergo cardioversion with Dr. Royann Shivers in 2023, but did have early recurrence of A-fib at OV follow-up post DCCV.   Last seen by Dr. Eden Emms on July 04, 2021. Was overall doing well.   Was admitted to the hospital in April 2024 due to acute kidney injury and AMS after falling at home.  Imaging revealed no significant fractures or trauma.  CT of the head was negative for anything acute.  Found to  have mild rhabdomyolysis, nontraumatic, and resolved with IV fluid resuscitation.  Foley was placed due to acute urinary retention, cultures revealed E. coli sensitive to Bactrim, was placed on antibiotics to continue outpatient.  Was told to follow-up with urology outpatient.  Hospital course complicated by acute metabolic encephalopathy that was felt to be likely due to hypoglycemia.  MRI of the brain was negative for any abnormalities, Amaryl was discontinued.  Was also found to have sinus tachycardia and hypotension during hospitalization, medication was adjusted.  Today he presents for hospital follow-up.  He states she fell due to a syncopal episode at home while walking to the bathroom, did not have any warning signs/symptoms prior to event. Wife says bruising along face has improved. He has returned to taking his blood thinner, denies any bleeding issues or any red flag signs/symptoms. Denies any chest pain, shortness of breath, palpitations, orthopnea, PND, swelling or significant weight changes, acute bleeding, or claudication. Does note fatigue and dizziness, denies any orthostatic dizziness. PT is working with him at home d/t weakness from leg braces and hx of bilateral foot drop.    EKGs/Labs/Other Studies Reviewed:   The following studies were reviewed today:   EKG:  EKG is not ordered today.    Echocardiogram 11/2019:  1. Left ventricular ejection fraction, by estimation, is 50 to 55%. The left ventricle has low normal function. The left ventricle has no regional wall motion abnormalities. There is moderate left ventricular hypertrophy.  Left ventricular diastolic parameters are indeterminate in the setting of atrial fibrillation.   2. Right ventricular systolic function is normal. The right ventricular size is normal.   3. The mitral valve is grossly normal. Trivial mitral valve  regurgitation.   4. The aortic valve is tricuspid. Aortic valve regurgitation is not visualized.   5.  The inferior vena cava is normal in size with greater than 50% respiratory variability, suggesting right atrial pressure of 3 mmHg.  Myoview 03/2013:  IMPRESSION:  Abnormal, but overall low risk Lexiscan Cardiolite. There were no  diagnostic ST segment abnormalities or chest pain. Perfusion imaging  suggest possible area of scar with mild peri-infarct ischemia in the  inferior wall, LVEF 55% with apparent inferior basal hypokinesis and  normal volumes.  Recent Labs: 05/14/2022: TSH 2.324 05/15/2022: ALT 33 05/16/2022: Magnesium 2.0 05/19/2022: BUN 20; Creatinine, Ser 0.94; Hemoglobin 12.6; Platelets 157; Potassium 3.6; Sodium 137  Recent Lipid Panel No results found for: "CHOL", "TRIG", "HDL", "CHOLHDL", "VLDL", "LDLCALC", "LDLDIRECT"  Risk Assessment/Calculations:   CHA2DS2-VASc Score = 4   This indicates a 4.8% annual risk of stroke. The patient's score is based upon: CHF History: 0 HTN History: 1 Diabetes History: 1 Stroke History: 0 Vascular Disease History: 0 Age Score: 2 Gender Score: 0   Home Medications   Current Meds  Medication Sig   amLODipine (NORVASC) 5 MG tablet Take 5 mg by mouth daily.   apixaban (ELIQUIS) 5 MG  TABS tablet Take 1 tablet (5 mg total) by mouth 2 (two) times daily.   donepezil (ARICEPT) 5 MG tablet Take 5 mg by mouth daily.   ezetimibe (ZETIA) 10 MG tablet Take 1 tablet (10 mg total) by mouth daily.   losartan (COZAAR) 50 MG tablet Take 1 tablet (50 mg total) by mouth daily.   metoprolol tartrate (LOPRESSOR) 25 MG tablet TAKE 1 TABLET(25 MG) BY MOUTH TWICE DAILY   solifenacin (VESICARE) 10 MG tablet Take 1 tablet (10 mg total) by mouth daily.   losartan (COZAAR) 100 MG tablet Take 100 mg by mouth daily.     Review of Systems    All other systems reviewed and are otherwise negative except as noted above.  Physical Exam    VS:  BP 112/64   Pulse 68   Ht 5\' 9"  (1.753 m)   Wt 241 lb 3.2 oz (109.4 kg)   SpO2 95%   BMI 35.62 kg/m  , BMI  Body mass index is 35.62 kg/m.  Wt Readings from Last 3 Encounters:  06/12/22 241 lb 3.2 oz (109.4 kg)  05/29/22 226 lb 9.6 oz (102.8 kg)  05/23/22 233 lb 6.4 oz (105.9 kg)     GEN: Obese, 77 y.o. male in no acute distress. HEENT: normal. Neck: Supple, no JVD, carotid bruits, or masses. Cardiac: S1/S2, irregular rhythm and regular rate, no murmurs, rubs, or gallops. No clubbing, cyanosis, edema.  Radials/PT 2+ and equal bilaterally.  Respiratory:  Respirations regular and unlabored, clear to auscultation bilaterally. MS: Wears bilateral leg braces due to foot drop, No other deformity or atrophy. Skin: Warm and dry, no rash. Neuro:  Strength and sensation are intact. Psych: Normal affect.  Assessment & Plan    Syncope, hx of fall, fatigue, dizziness, medication management Etiology uncertain. Denies any warning symptoms. Denied any CP surrounding event. Will obtain 14 day live monitor and arrange the following labs: CBC and BMET. Will also update Echo at this time. Does not sound orthostatic in nature. If no improvement in symptoms by next OV, will obtain orthostatics. Will reduce losartan to 50 mg daily. No other medication changes at this time. No acute abnormalities seen on CT imaging. Continue to work with PT. Care and ED precautions discussed.   Persistent A-fib Denies any tachycardia or palpitations. HR well controlled today. Arranging 14 day live monitor as mentioned above and obtaining labs - see above. Continue Lopressor and Eliquis 5 mg BID, on appropriate dosage and will obtain CBC - see above. Heart healthy diet encouraged.   HTN BP soft. Reducing losartan to 50 mg daily to improve fatigue and dizziness. Discussed to monitor BP at home at least 2 hours after medications and sitting for 5-10 minutes. Continue Lopressor. Heart healthy diet encouraged. Arranging labs.   HLD LDL 152 08/2021. Currently on Zetia. At next visit, will address starting a statin. Heart healthy diet  encouraged.   5. Ascending aortic aneurysm Noted on CT 04/2022, measuring 4.1 cm. Recommended for annual imaging. It was recommended he follow-up with PCP for CTA and with vascular surgery for further imaging outpatient. If not arranged by next follow-up by PCP, will place referral to [facility]. Care and ED precautions discussed.   Disposition: Follow up in 6-8 week(s) with Charlton Haws, MD or APP.   Signed, Sharlene Dory, NP 06/15/2022, 10:33 AM [facility]

## 2022-06-12 NOTE — Telephone Encounter (Signed)
Patient c/o Palpitations:  High priority if patient c/o lightheadedness, shortness of breath, or chest pain  How long have you had palpitations/irregular HR/ Afib? Are you having the symptoms now? Yes   Are you currently experiencing lightheadedness, SOB or CP? Lightheadedness, syncope  Do you have a history of afib (atrial fibrillation) or irregular heart rhythm? Yes   Have you checked your BP or HR? (document readings if available): Yes (does not have reading but says that it was very low   Are you experiencing any other symptoms? syncope

## 2022-06-12 NOTE — Telephone Encounter (Signed)
I spoke with wife. Patient did NOT have syncope today. He had syncopal episode 05/10/22 and was admitted to [facility] on 05/12/22 and then discharged to the [facility] for rehab with balance.  They saw pcp, Dr.Golding, who recommended patient have cardiology f/u as he was noted to have abdominal aneurysm and wife wants to discuss plans.  He is in recall for June with Dr.Nishan.   Wife states he has HHN who continues to help patient with mobility issues.   They have apt scheduled today with E.Peck,NP

## 2022-06-12 NOTE — Progress Notes (Deleted)
Received page overnight about iRhythm result from cardiac monitor of patient: showed atrial fibrillation 58-81bpm x90 seconds at 4:48pm on 06/12/22. Appears to already be on apixaban based on medication list. Will route this message to his primary cardiologist, Dr. Nishan, to let know of the result.   Tavious Griesinger L Seanne Chirico, MD Cardiology 06/12/2022  

## 2022-06-12 NOTE — Patient Instructions (Addendum)
Medication Instructions:  Your physician has recommended you make the following change in your medication:  Decrease losartan to 50 mg daily Continue other medications the same  Labwork: BMET & CBC in 1 week (06/19/2022) Non-fasting [facility]  Testing/Procedures: Your physician has requested that you have an echocardiogram. Echocardiography is a painless test that uses sound waves to create images of your heart. It provides your doctor with information about the size and shape of your heart and how well your heart's chambers and valves are working. This procedure takes approximately one hour. There are no restrictions for this procedure. Please do NOT wear cologne, perfume, aftershave, or lotions (deodorant is allowed). Please arrive 15 minutes prior to your appointment time. Your physician has recommended that you wear a Zio monitor.   This monitor is a medical device that records the heart's electrical activity. Doctors most often use these monitors to diagnose arrhythmias. Arrhythmias are problems with the speed or rhythm of the heartbeat. The monitor is a small device applied to your chest. You can wear one while you do your normal daily activities. While wearing this monitor if you have any symptoms to push the button and record what you felt. Once you have worn this monitor for the period of time provider prescribed (for 14 days), you will return the monitor device in the postage paid box. Once it is returned they will download the data collected and provide [place] with a report which the provider will then review and we will call you with those results. Important tips:  Avoid showering during the first 48 hours of wearing the monitor. Avoid excessive sweating to help maximize wear time. Do not submerge the device, no hot tubs, and no swimming pools. Keep any lotions or oils away from the patch. After 48 hours you may shower with the patch on. Take brief showers with your back facing the  shower head.  Do not remove patch once it has been placed because that will interrupt data and decrease adhesive wear time. Push the button when you have any symptoms and write down what you were feeling. Once you have completed wearing your monitor, remove and place into box which has postage paid and place in your outgoing mailbox.  If for some reason you have misplaced your box then call our office and we can provide another box and/or mail it off for you.  Follow-Up: Your physician recommends that you schedule a follow-up appointment in: 6-8 weeks  Any Other Special Instructions Will Be Listed Below (If Applicable).  If you need a refill on your cardiac medications before your next appointment, please call your pharmacy.

## 2022-06-13 DIAGNOSIS — R55 Syncope and collapse: Secondary | ICD-10-CM | POA: Diagnosis not present

## 2022-06-15 NOTE — Patient Outreach (Signed)
Care Coordination   Initial Visit Note   06/11/2022 Name: IOSEFA THIBODEAU MRN: [id number] DOB: [birth year]  REEDER PROA is a 77 y.o. year old male who sees [name], MD for primary care. I spoke with  [name] by phone today.  What matters to the patients health and wellness today?  Managing blood sugar and following up on AAA    Goals Addressed             This Visit's Progress    Follow-up on AAA       Care Coordination Goals: Patient will follow-up with PCP as planned Patient will talk with PCP about referral to vascular surgeon to establish care and monitor AAA [facility] sent letter to Dr Phillips Odor requesting a referral Patient will reach out to PCP with any questions regarding AAA Patient will reach out to RN Care Coordinator [phone] with any resource or care coordination needs     Manage Blood Sugar and Diabetes       Care Coordination Goals: Patient will follow-up with PCP and/or endocrinologist every 3 months or as recommended Patient will take medication as prescribed and reach out to provider with any negative side effects Patient will continue to hold glimepiride as instructed Patient will continue to monitor and record blood sugar daily and as needed with Freestyle Libre CGM, and will call PCP or endocrinologist with any readings outside of recommended range Patient will take blood sugar log and meter to provider visits for review Patient will review list of Medicare approved diabetic supply DME providers and check to see if they can provide his sensors cheaper than local pharmacy. He doesn't qualify for insurance coverage b/c he does not use insulin.  Patient will follow a modified carbohydrate diet and decrease simple carbohydrates and sugars Patient will increase activity level as tolerated with an ultimate goal of at least 150 minutes of exercise per week Patient will check feet daily for sores, wounds, calluses, etc and will notify provider of any  abnormal findings Patient will have yearly eye exams to check for or monitor diabetic retinopathy Patient will reach out to RN Care Coordinator [phone] with any care coordination or resource needs         SDOH assessments and interventions completed:  Yes  SDOH Interventions Today    Flowsheet Row Most Recent Value  SDOH Interventions   Housing Interventions Intervention Not Indicated  Transportation Interventions Intervention Not Indicated  Financial Strain Interventions Intervention Not Indicated        Care Coordination Interventions:  Yes, provided  Interventions Today    Flowsheet Row Most Recent Value  Chronic Disease   Chronic disease during today's visit Diabetes, Other  [AAA]  General Interventions   General Interventions Discussed/Reviewed General Interventions Discussed, Labs, General Interventions Reviewed, Durable Medical Equipment (DME), Doctor Visits, Communication with  Labs Hgb A1c every 3 months, Hgb A1c every 6 months, Kidney Function  Doctor Visits Discussed/Reviewed Specialist, Doctor Visits Discussed, Doctor Visits Reviewed, PCP  Durable Medical Equipment (DME) Other  Basilia J[employer] Libre Continuous Glucose Monitor]  PCP/Specialist Visits Compliance with follow-up visit  Communication with PCP/Specialists  [[facility] regarding need to establish care for AAA follow-up. Letter faxed to Dr Phillips Odor requesting referral to [facility] to establish care for monitoring of AAA]  Exercise Interventions   Exercise Discussed/Reviewed Physical Activity  Physical Activity Discussed/Reviewed Physical Activity Discussed, Physical Activity Reviewed  Education Interventions   Education Provided Provided Education, Provided Printed Education  Provided Verbal Education On  Nutrition, Medication, Blood Sugar Monitoring, When to see the doctor, Mental Health/Coping with Illness, Foot Care  [mailed list of Medicare DME suppliers that support Freestyle Libre]  Nutrition Interventions    Nutrition Discussed/Reviewed Nutrition Discussed, Nutrition Reviewed, Carbohydrate meal planning, Portion sizes, Decreasing sugar intake  Pharmacy Interventions   Pharmacy Dicussed/Reviewed Pharmacy Topics Discussed, Pharmacy Topics Reviewed, Medications and their functions  [holding glimeperide]  Safety Interventions   Safety Discussed/Reviewed Safety Discussed, Safety Reviewed       Follow up plan: Follow up call scheduled for 06/28/22    Encounter Outcome:  Pt. Visit Completed   Demetrios Loll, BSN, RN-BC RN Care Coordinator [facility]  [facility] Direct Dial: [phone] Main #: [phone]

## 2022-06-18 DIAGNOSIS — I480 Paroxysmal atrial fibrillation: Secondary | ICD-10-CM | POA: Diagnosis not present

## 2022-06-18 DIAGNOSIS — N390 Urinary tract infection, site not specified: Secondary | ICD-10-CM | POA: Diagnosis not present

## 2022-06-18 DIAGNOSIS — R55 Syncope and collapse: Secondary | ICD-10-CM | POA: Diagnosis not present

## 2022-06-18 DIAGNOSIS — M21371 Foot drop, right foot: Secondary | ICD-10-CM | POA: Diagnosis not present

## 2022-06-18 DIAGNOSIS — Z79899 Other long term (current) drug therapy: Secondary | ICD-10-CM | POA: Diagnosis not present

## 2022-06-18 DIAGNOSIS — N179 Acute kidney failure, unspecified: Secondary | ICD-10-CM | POA: Diagnosis not present

## 2022-06-18 DIAGNOSIS — M6281 Muscle weakness (generalized): Secondary | ICD-10-CM | POA: Diagnosis not present

## 2022-06-18 DIAGNOSIS — G9341 Metabolic encephalopathy: Secondary | ICD-10-CM | POA: Diagnosis not present

## 2022-06-19 LAB — CBC
Hematocrit: 39.2 % (ref 37.5–51.0)
Hemoglobin: 12.8 g/dL — ABNORMAL LOW (ref 13.0–17.7)
MCH: 29.2 pg (ref 26.6–33.0)
MCHC: 32.7 g/dL (ref 31.5–35.7)
MCV: 90 fL (ref 79–97)
Platelets: 207 10*3/uL (ref 150–450)
RBC: 4.38 x10E6/uL (ref 4.14–5.80)
RDW: 13.3 % (ref 11.6–15.4)
WBC: 7.7 10*3/uL (ref 3.4–10.8)

## 2022-06-19 LAB — BASIC METABOLIC PANEL
BUN/Creatinine Ratio: 25 — ABNORMAL HIGH (ref 10–24)
BUN: 25 mg/dL (ref 8–27)
CO2: 24 mmol/L (ref 20–29)
Calcium: 9.3 mg/dL (ref 8.6–10.2)
Chloride: 103 mmol/L (ref 96–106)
Creatinine, Ser: 1 mg/dL (ref 0.76–1.27)
Glucose: 119 mg/dL — ABNORMAL HIGH (ref 70–99)
Potassium: 4.4 mmol/L (ref 3.5–5.2)
Sodium: 139 mmol/L (ref 134–144)
eGFR: 78 mL/min/{1.73_m2} (ref >59)

## 2022-06-27 ENCOUNTER — Telehealth: Payer: Self-pay | Admitting: Nurse Practitioner

## 2022-06-27 DIAGNOSIS — M6281 Muscle weakness (generalized): Secondary | ICD-10-CM | POA: Diagnosis not present

## 2022-06-27 DIAGNOSIS — N390 Urinary tract infection, site not specified: Secondary | ICD-10-CM | POA: Diagnosis not present

## 2022-06-27 DIAGNOSIS — I480 Paroxysmal atrial fibrillation: Secondary | ICD-10-CM | POA: Diagnosis not present

## 2022-06-27 DIAGNOSIS — M21371 Foot drop, right foot: Secondary | ICD-10-CM | POA: Diagnosis not present

## 2022-06-27 DIAGNOSIS — G9341 Metabolic encephalopathy: Secondary | ICD-10-CM | POA: Diagnosis not present

## 2022-06-27 DIAGNOSIS — N179 Acute kidney failure, unspecified: Secondary | ICD-10-CM | POA: Diagnosis not present

## 2022-06-27 NOTE — Telephone Encounter (Signed)
Checking percert on the following patient for testing scheduled at [facility]    ECHO --   06/28/2022

## 2022-06-28 ENCOUNTER — Ambulatory Visit (HOSPITAL_COMMUNITY)
Admission: RE | Admit: 2022-06-28 | Discharge: 2022-06-28 | Disposition: A | Discharge status: Home / Self Care | Payer: Medicare Other | Source: Ambulatory Visit | Attending: Nurse Practitioner | Admitting: Nurse Practitioner

## 2022-06-28 ENCOUNTER — Ambulatory Visit: Payer: Self-pay | Admitting: *Deleted

## 2022-06-28 DIAGNOSIS — N390 Urinary tract infection, site not specified: Secondary | ICD-10-CM | POA: Diagnosis not present

## 2022-06-28 DIAGNOSIS — I480 Paroxysmal atrial fibrillation: Secondary | ICD-10-CM | POA: Diagnosis not present

## 2022-06-28 DIAGNOSIS — R55 Syncope and collapse: Secondary | ICD-10-CM | POA: Insufficient documentation

## 2022-06-28 DIAGNOSIS — G9341 Metabolic encephalopathy: Secondary | ICD-10-CM | POA: Diagnosis not present

## 2022-06-28 DIAGNOSIS — Z7901 Long term (current) use of anticoagulants: Secondary | ICD-10-CM | POA: Diagnosis not present

## 2022-06-28 DIAGNOSIS — N3281 Overactive bladder: Secondary | ICD-10-CM | POA: Diagnosis not present

## 2022-06-28 DIAGNOSIS — Z9181 History of falling: Secondary | ICD-10-CM | POA: Diagnosis not present

## 2022-06-28 DIAGNOSIS — Z87891 Personal history of nicotine dependence: Secondary | ICD-10-CM | POA: Diagnosis not present

## 2022-06-28 DIAGNOSIS — G709 Myoneural disorder, unspecified: Secondary | ICD-10-CM | POA: Diagnosis not present

## 2022-06-28 DIAGNOSIS — E7800 Pure hypercholesterolemia, unspecified: Secondary | ICD-10-CM | POA: Diagnosis not present

## 2022-06-28 DIAGNOSIS — N179 Acute kidney failure, unspecified: Secondary | ICD-10-CM | POA: Diagnosis not present

## 2022-06-28 DIAGNOSIS — N200 Calculus of kidney: Secondary | ICD-10-CM | POA: Diagnosis not present

## 2022-06-28 DIAGNOSIS — M21371 Foot drop, right foot: Secondary | ICD-10-CM | POA: Diagnosis not present

## 2022-06-28 DIAGNOSIS — N1330 Unspecified hydronephrosis: Secondary | ICD-10-CM | POA: Diagnosis not present

## 2022-06-28 DIAGNOSIS — R339 Retention of urine, unspecified: Secondary | ICD-10-CM | POA: Diagnosis not present

## 2022-06-28 DIAGNOSIS — I7121 Aneurysm of the ascending aorta, without rupture: Secondary | ICD-10-CM | POA: Diagnosis not present

## 2022-06-28 DIAGNOSIS — D696 Thrombocytopenia, unspecified: Secondary | ICD-10-CM | POA: Diagnosis not present

## 2022-06-28 DIAGNOSIS — M6281 Muscle weakness (generalized): Secondary | ICD-10-CM | POA: Diagnosis not present

## 2022-06-28 DIAGNOSIS — M1990 Unspecified osteoarthritis, unspecified site: Secondary | ICD-10-CM | POA: Diagnosis not present

## 2022-06-28 DIAGNOSIS — I10 Essential (primary) hypertension: Secondary | ICD-10-CM | POA: Diagnosis not present

## 2022-06-28 DIAGNOSIS — E11649 Type 2 diabetes mellitus with hypoglycemia without coma: Secondary | ICD-10-CM | POA: Diagnosis not present

## 2022-06-28 DIAGNOSIS — M21372 Foot drop, left foot: Secondary | ICD-10-CM | POA: Diagnosis not present

## 2022-06-28 LAB — ECHOCARDIOGRAM COMPLETE
AR max vel: 3.09 cm2
AV Area VTI: 2.88 cm2
AV Area mean vel: 3.55 cm2
AV Mean grad: 2.4 mmHg
AV Peak grad: 5.3 mmHg
Ao pk vel: 1.16 m/s
Area-P 1/2: 4.07 cm2
Calc EF: 53.4 %
MV M vel: 4.72 m/s
MV Peak grad: 89.1 mmHg
S' Lateral: 3.5 cm
Single Plane A2C EF: 48.7 %
Single Plane A4C EF: 57.7 %

## 2022-06-28 NOTE — Progress Notes (Signed)
  Echocardiogram 2D Echocardiogram has been performed.  Milda Smart 06/28/2022, 11:47 AM

## 2022-06-28 NOTE — Patient Outreach (Signed)
  Care Coordination   06/28/2022 Name: James Dyer MRN: [id number] DOB: [birth year]   Care Coordination Outreach Attempts:  An unsuccessful telephone outreach was attempted for a scheduled appointment today.  Follow Up Plan:  Additional outreach attempts will be made to offer the patient care coordination information and services.   Encounter Outcome:  No Answer. Patient scheduled for echo at same time as [facility] telephone follow-up appt.    Care Coordination Interventions:  Yes, provided   Interventions Today    Flowsheet Row Most Recent Value  Chronic Disease   Chronic disease during today's visit Other  [AAA]  General Interventions   General Interventions Discussed/Reviewed Communication with  Communication with --  [Scheduling Care Guide re: rescheduling patient's appt. He is scheduled for an echo at the same time as telephone follow-up]       Demetrios Loll, BSN, RN-BC RN Care Coordinator [facility]  [facility] Direct Dial: [phone] Main #: [phone]

## 2022-07-06 ENCOUNTER — Other Ambulatory Visit: Payer: Self-pay | Admitting: Adult Health

## 2022-07-11 ENCOUNTER — Telehealth: Payer: Self-pay | Admitting: *Deleted

## 2022-07-11 DIAGNOSIS — M21371 Foot drop, right foot: Secondary | ICD-10-CM | POA: Diagnosis not present

## 2022-07-11 DIAGNOSIS — N390 Urinary tract infection, site not specified: Secondary | ICD-10-CM | POA: Diagnosis not present

## 2022-07-11 DIAGNOSIS — N179 Acute kidney failure, unspecified: Secondary | ICD-10-CM | POA: Diagnosis not present

## 2022-07-11 DIAGNOSIS — I480 Paroxysmal atrial fibrillation: Secondary | ICD-10-CM | POA: Diagnosis not present

## 2022-07-11 DIAGNOSIS — G9341 Metabolic encephalopathy: Secondary | ICD-10-CM | POA: Diagnosis not present

## 2022-07-11 DIAGNOSIS — M6281 Muscle weakness (generalized): Secondary | ICD-10-CM | POA: Diagnosis not present

## 2022-07-16 DIAGNOSIS — M21371 Foot drop, right foot: Secondary | ICD-10-CM | POA: Diagnosis not present

## 2022-07-16 DIAGNOSIS — N390 Urinary tract infection, site not specified: Secondary | ICD-10-CM | POA: Diagnosis not present

## 2022-07-16 DIAGNOSIS — N179 Acute kidney failure, unspecified: Secondary | ICD-10-CM | POA: Diagnosis not present

## 2022-07-16 DIAGNOSIS — G9341 Metabolic encephalopathy: Secondary | ICD-10-CM | POA: Diagnosis not present

## 2022-07-16 DIAGNOSIS — I480 Paroxysmal atrial fibrillation: Secondary | ICD-10-CM | POA: Diagnosis not present

## 2022-07-16 DIAGNOSIS — M6281 Muscle weakness (generalized): Secondary | ICD-10-CM | POA: Diagnosis not present

## 2022-07-18 NOTE — Progress Notes (Unsigned)
History of Present Illness: James Dyer is a 77 y.o. male who presents today for follow up visit at [facility]. - GU history: 1. Prostate cancer.   - 11/23/2010: Underwent TRUS/Bx. Prostate volume 38 mL, PSA density 0.10.  1/12 cores revealed GS 3+4 pattern. - He completed IMRT on 06/06/2011.  - Most recent PSA was undetectable (<0.1) on 01/09/2022. 2.  OAB with urinary frequency, nocturia, urgency, and urge incontinence. - ***Taking Solifenacin 10 mg daily. **refill needed - ***Previously took Oxybutynin 15 mg and Myrbetriq 50 mg.   - Reports caffeine use (5-6 Sun drops per day). 3.  Kidney stones.  - 03/04/2017: Underwent left ESWL by Dr. Liliane Shi.   At last visit with Dr. Retta Diones on 01/09/2022: Doing well. Plan was follow up in 1 year with PSA recheck.  Since last visit: > Admitted  05/12/2022 - 05/22/2022 for AKI, altered mental status, and rhabdomyolysis after falling at home.  - AKI likely prerenal azotemia. On admission creatinine 1.4 with a baseline around 1.0, CK was also elevated. After hydration creatinine returned to baseline and rhabdomyolysis resolved.  - Urine culture on 05/18/2022 positive for E. Coli. Treated with Bactrim.  - Foley was placed on admission due to acute urinary retention. Foley was discontinued prior to discharge after he passed a voiding trial.  - Started on Flomax prior to discharge.  - Discharged to SNF with recommendation for outpatient workup to evaluate for dementia due to memory issues.  > 05/12/2022: CT abdomen/pelvis w/ contrast showed: Multiple cysts are present within the kidneys. No renal calculus or hydronephrosis. Bladder is unremarkable.  > 05/19/2022: RUS showed: 1. No hydronephrosis or shadowing stone. 2. Bilateral renal cysts.  > 06/18/2022: Creatinine 1.00; GFR 78.    Today: He reports ***  He {Actions; denies-reports:120008} recent episode of stone pain/ passage. He {Actions; denies-reports:120008} acute flank  pain / abdominal pain. He {Actions; denies-reports:120008} fevers/chills/sweats.  He {Actions; denies-reports:120008} nausea/vomiting.  He {Actions; denies-reports:120008} increased urinary frequency (*** times per day and *** times at night). He {Actions; denies-reports:120008} urgency. He {Actions; denies-reports:120008} dysuria. He {Actions; denies-reports:120008} gross hematuria. He {Actions; denies-reports:120008} the need to strain to void. He {Actions; denies-reports:120008} sensations of incomplete emptying.  He leaks *** times per ***. Wears *** ***pads/ ***diapers per day.   Fall Screening: Do you usually have a device to assist in your mobility? {yes/no:20286} ***cane / ***walker / ***wheelchair   Medications: Current Outpatient Medications  Medication Sig Dispense Refill   amLODipine (NORVASC) 5 MG tablet Take 5 mg by mouth daily.     apixaban (ELIQUIS) 5 MG TABS tablet Take 1 tablet (5 mg total) by mouth 2 (two) times daily. 60 tablet 0   donepezil (ARICEPT) 5 MG tablet Take 5 mg by mouth daily.     ezetimibe (ZETIA) 10 MG tablet Take 1 tablet (10 mg total) by mouth daily. 30 tablet 0   losartan (COZAAR) 50 MG tablet Take 1 tablet (50 mg total) by mouth daily. 30 tablet 6   metoprolol tartrate (LOPRESSOR) 25 MG tablet TAKE 1 TABLET(25 MG) BY MOUTH TWICE DAILY 60 tablet 0   solifenacin (VESICARE) 10 MG tablet Take 1 tablet (10 mg total) by mouth daily. 30 tablet 0   No current facility-administered medications for this visit.    Allergies: Allergies  Allergen Reactions   Latex Other (See Comments)    "sores in mouth after teeth cleaned"    Past Medical History:  Diagnosis Date   Arthritis  HNP- lumbar   Borderline diabetes    Cancer (HCC) 2013   prostate , treated /w radiation    Diabetes mellitus without complication (HCC)    Foot drop, bilateral since birthpt wears braces in shoes   pt must have braces to walk   Hepatitis    "pt told had been exposed to  hepatitis when he went to donate blood"   History of kidney stones    History of radiation therapy 04/12/11 - 06/06/11   prostate, seminal vesicles   History of stress test    Echocardiogram done - 03/2013   Hypercholesterolemia    states Dr. Renette Butters Rx Welchol- pt. hs stopped taking because he believe it gives him  leg cramps.   Hypertension    Nephrolithiasis    renal calculi- , also has been told that he has a cyst on kidney - R side    Neuromuscular disorder (HCC)    bilateral foot drop- both feet    Paroxysmal atrial fibrillation (HCC)    Past Surgical History:  Procedure Laterality Date   BACK SURGERY     CARDIOVERSION N/A 02/28/2021   Procedure: CARDIOVERSION;  Surgeon: Thurmon Fair, MD;  Location: [facility];  Service: Cardiovascular;  Laterality: N/A;   COLONOSCOPY N/A 07/22/2012   Procedure: COLONOSCOPY;  Surgeon: Dalia Heading, MD;  Location: [facility];  Service: Gastroenterology;  Laterality: N/A;   ESOPHAGEAL DILATION N/A 04/22/2019   Procedure: ESOPHAGEAL DILATION;  Surgeon: Malissa Hippo, MD;  Location: [facility];  Service: Endoscopy;  Laterality: N/A;   ESOPHAGOGASTRODUODENOSCOPY N/A 04/22/2019   Procedure: ESOPHAGOGASTRODUODENOSCOPY (EGD);  Surgeon: Malissa Hippo, MD;  Location: [facility];  Service: Endoscopy;  Laterality: N/A;  145   EXTRACORPOREAL SHOCK WAVE LITHOTRIPSY Left 03/04/2017   Procedure: LEFT EXTRACORPOREAL SHOCK WAVE LITHOTRIPSY (ESWL);  Surgeon: Rene Paci, MD;  Location: [facility];  Service: Urology;  Laterality: Left;   FOOT SURGERY Right 1980's   calcium deposit removed   LUMBAR LAMINECTOMY/DECOMPRESSION MICRODISCECTOMY Right 09/01/2013   Procedure: RIGHT LUMBAR FOUR-FIVE LAMINECTOMY/DECOMPRESSION MICRODISCECTOMY 1 LEVEL;  Surgeon: Coletta Memos, MD;  Location: [facility];  Service: Neurosurgery;  Laterality: Right;  Right L45 microdiskectomy   prostate biopsy  2013   SHOULDER OPEN ROTATOR CUFF REPAIR Left 09/09/2012    Procedure: LEFT ROTATOR CUFF REPAIR SHOULDER OPEN;  Surgeon: Jacki Cones, MD;  Location: [facility];  Service: Orthopedics;  Laterality: Left;   No family history on file. Social History   Socioeconomic History   Marital status: Married    Spouse name: Not on file   Number of children: Not on file   Years of education: Not on file   Highest education level: Not on file  Occupational History   Not on file  Tobacco Use   Smoking status: Former    Packs/day: 1.00    Years: 5.00    Additional pack years: 0.00    Total pack years: 5.00    Types: Cigarettes    Quit date: 12/18/1966    Years since quitting: 55.6   Smokeless tobacco: Never  Vaping Use   Vaping Use: Never used  Substance and Sexual Activity   Alcohol use: Yes    Comment: occasional beer   Drug use: No   Sexual activity: Never  Other Topics Concern   Not on file  Social History Narrative   Not on file   Social Determinants of Health   Financial Resource Strain: Low Risk  (06/11/2022)  Overall Financial Resource Strain (CARDIA)    Difficulty of Paying Living Expenses: Not very hard  Food Insecurity: No Food Insecurity (05/13/2022)   Hunger Vital Sign    Worried About Running Out of Food in the Last Year: Never true    Ran Out of Food in the Last Year: Never true  Transportation Needs: No Transportation Needs (06/11/2022)   PRAPARE - Administrator, Civil Service (Medical): No    Lack of Transportation (Non-Medical): No  Physical Activity: Not on file  Stress: Not on file  Social Connections: Not on file  Intimate Partner Violence: Not At Risk (05/13/2022)   Humiliation, Afraid, Rape, and Kick questionnaire    Fear of Current or Ex-Partner: No    Emotionally Abused: No    Physically Abused: No    Sexually Abused: No    Review of Systems Constitutional: Patient ***denies any unintentional weight loss or change in strength lntegumentary: Patient ***denies any rashes or pruritus Eyes: Patient  denies ***dry eyes ENT: Patient ***denies dry mouth Cardiovascular: Patient ***denies chest pain or syncope Respiratory: Patient ***denies shortness of breath Gastrointestinal: Patient ***denies nausea, vomiting, constipation, or diarrhea Musculoskeletal: Patient ***denies muscle cramps or weakness Neurologic: Patient ***denies convulsions or seizures Psychiatric: Patient ***denies memory problems Allergic/Immunologic: Patient ***denies recent allergic reaction(s) Hematologic/Lymphatic: Patient denies bleeding tendencies Endocrine: Patient ***denies heat/cold intolerance  GU: As per HPI.  OBJECTIVE There were no vitals filed for this visit. There is no height or weight on file to calculate BMI.  Physical Examination  Constitutional: ***No obvious distress; patient is ***non-toxic appearing  Cardiovascular: ***No visible lower extremity edema.  Respiratory: The patient does ***not have audible wheezing/stridor; respirations do ***not appear labored  Gastrointestinal: Abdomen ***non-distended Musculoskeletal: ***Normal ROM of UEs  Skin: ***No obvious rashes/open sores  Neurologic: CN 2-12 grossly ***intact Psychiatric: Answered questions ***appropriately with ***normal affect  Hematologic/Lymphatic/Immunologic: ***No obvious bruises or sites of spontaneous bleeding  UA: {Desc; negative/positive:13464} *** WBC/hpf, *** RBC/hpf, bacteria (***) *** nitrites, *** leukocytes, *** blood PVR: *** ml  ASSESSMENT No diagnosis found. ***  Will plan for follow up in ***months / ***1 year or sooner if needed. Pt verbalized understanding and agreement. All questions were answered.  PLAN Advised the following: 1. *** 2. ***No follow-ups on file.  No orders of the defined types were placed in this encounter.   It has been explained that the patient is to follow regularly with their PCP in addition to all other providers involved in their care and to follow instructions provided by  these respective offices. Patient advised to contact urology clinic if any urologic-pertaining questions, concerns, new symptoms or problems arise in the interim period.  There are no Patient Instructions on file for this visit.  Electronically signed by:  Donnita Falls, FNP   07/18/22    10:21 AM

## 2022-07-19 ENCOUNTER — Ambulatory Visit (INDEPENDENT_AMBULATORY_CARE_PROVIDER_SITE_OTHER): Payer: Medicare Other | Admitting: Urology

## 2022-07-19 ENCOUNTER — Encounter: Payer: Self-pay | Admitting: Urology

## 2022-07-19 VITALS — BP 112/63 | HR 75 | Temp 97.6°F

## 2022-07-19 DIAGNOSIS — Z923 Personal history of irradiation: Secondary | ICD-10-CM

## 2022-07-19 DIAGNOSIS — N3941 Urge incontinence: Secondary | ICD-10-CM | POA: Diagnosis not present

## 2022-07-19 DIAGNOSIS — Z87442 Personal history of urinary calculi: Secondary | ICD-10-CM

## 2022-07-19 DIAGNOSIS — Z8546 Personal history of malignant neoplasm of prostate: Secondary | ICD-10-CM | POA: Diagnosis not present

## 2022-07-19 DIAGNOSIS — N200 Calculus of kidney: Secondary | ICD-10-CM

## 2022-07-19 DIAGNOSIS — G4733 Obstructive sleep apnea (adult) (pediatric): Secondary | ICD-10-CM | POA: Insufficient documentation

## 2022-07-19 DIAGNOSIS — C61 Malignant neoplasm of prostate: Secondary | ICD-10-CM | POA: Diagnosis not present

## 2022-07-19 DIAGNOSIS — N3281 Overactive bladder: Secondary | ICD-10-CM

## 2022-07-19 DIAGNOSIS — R3915 Urgency of urination: Secondary | ICD-10-CM

## 2022-07-19 DIAGNOSIS — R350 Frequency of micturition: Secondary | ICD-10-CM

## 2022-07-19 DIAGNOSIS — R413 Other amnesia: Secondary | ICD-10-CM | POA: Insufficient documentation

## 2022-07-19 MED ORDER — MIRABEGRON ER 25 MG PO TB24
50.0000 mg | ORAL_TABLET | Freq: Every day | ORAL | 11 refills | Status: AC
Start: 2022-07-19 — End: ?

## 2022-07-19 NOTE — Patient Instructions (Signed)
Overactive bladder (OAB) overview for patients:  Symptoms may include: urinary urgency ("gotta go" feeling) urinary frequency (voiding >8 times per day) night time urination (nocturia) urge incontinence of urine (UUI)  While we do not know the exact etiology of OAB, several treatment options exist including:  Behavioral therapy: Reducing fluid intake Decreasing bladder stimulants (such as caffeine) and irritants (such as acidic food, spicy foods, alcohol) Urge suppression strategies Bladder retraining via timed voiding Pelvic floor physical therapy  Medication(s) - can use one or both of the drug classes below. Anticholinergic / antimuscarinic medications:  Mechanism of action: Activate M3 receptors to reduce detrusor stimulation and increase bladder capacity  (parasympathetic nervous system). Effect: Relaxes the bladder to decrease overactivity, increase bladder storage capacity, and increase time between voids. Onset: Slow acting (may take 8-12 weeks to determine efficacy). Medications include: Vesicare (Solifenacin), Ditropan (Oxybutynin), Detrol (Tolterodine), Toviaz (Fesoterodine), Sanctura (Trospium), Urispas (Flavoxate), Enablex (Darifenacin), Bentyl (Dicyclomine), Levsin (Hyoscyamine ). Potential side effects include but are not limited to: Dry eyes, dry mouth, constipation, cognitive impairment, dementia risk with long term use, and urinary retention/ incomplete bladder emptying. Insurance companies generally prefer for patients to try 1-2 anticholinergic / antimuscarinic medications first due to low cost. Some exceptions are made based on patient-specific comorbidities / risk factors. Beta-3 agonist medications: Mechanism of action: Stimulates selective B3 adrenergic receptors to cause smooth muscle bladder relaxation (sympathetic nervous system). Effect: Relaxes the bladder to decrease overactivity, increase bladder storage capacity, and increase time between voids. Onset:  Slow acting (may take 8-12 weeks to determine efficacy). Medications include: Myrbetriq (Mirabegron) and Vibegron (Gemtesa). Potential side effects include but are not limited to: urinary retention / incomplete bladder emptying and elevated blood pressure (more likely to occur in individuals with pre-existing uncontrolled hypertension). These medications tend to be more expensive than the anticholinergic / antimuscarinic medications.   For patients with refractory OAB (if the above treatment options have been unsuccessful): Posterior tibial nerve stimulation (PTNS). Small acupuncture-type needle inserted near ankle with electric current to stimulate bladder via posterior tibial nerve pathway. Initially requires 12 weekly in-office treatments lasting 30 minutes each; followed by monthly in-office treatments lasting 30 minutes each for 1 year.  Bladder Botox injections. How it is done: Typically done via in-office cystoscopy; sometimes done in the OR depending on the situation. The bladder is numbed with lidocaine instilled via a catheter. Then the urologist injects Botox into the bladder muscle wall in about 20 locations. Causes local paralysis of the bladder muscle at the injection sites to reduce bladder muscle overactivity / spasms. The effect lasts for approximately 6 months and cannot be reversed once performed. Risks may included but are not limited to: infection, incomplete bladder emptying/ urinary retention, short term need for self-catheterization or indwelling catheter, and need for repeat therapy. There is a 5-12% chance of needing to catheterize with Botox - that usually resolves in a few months as the Botox wears off. Typically Botox injections would need to be repeated every 3-12 months since this is not a permanent therapy.  Sacral neuromodulation trial (Medtronic lnterStim or Axonics implant). Sacral neuromodulation is FDA-approved for uncontrolled urinary urgency, urinary frequency,  urinary urge incontinence, non-obstructive urinary retention, or fecal incontinence. It is not FDA-approved as a treatment for pain. The goal of this therapy is at least a 50% improvement in symptoms. It is NOT realistic to expect a 100% cure. This is a a 2-step outpatient procedure. After a successful test period, a permanent wire and generator are placed   in the OR. We discussed the risk of infection. We reviewed the fact that about 30% of patients fail the test phase and are not candidates for permanent generator placement. During the 1-2 week trial phase, symptoms are documented by the patient to determine response. If patient gets at least a 50% improvement in symptoms, they may then proceed with Step 2. Step 1: Trial lead placement. Per physician discretion, may done one of two ways: Percutaneous nerve evaluation (PNE) in the [place] [facility] office. Performed by urologist under local anesthesia (numbing the area with lidocaine) using a spinal needle for placement of test wire, which usually stays in place for 5-7 days to determine therapy response. Test lead placement in OR under anesthesia. Usually stays in place 2 weeks to determine therapy response. > Step 2: Permanent implantation of sacral neuromodulation device, which is performed in the OR.  Sacral neuromodulation implants: All are conditionally MRI safe. Manufacturer: Medtronic Website: www.medtronic.com/[place]-en/patients/treatments-therapies/neurostimulator-overactive-bladder/getting therapy/right-for-you.html Options: lnterStim X: Non-rechargeable. The battery lasts 10 years on average. lnterStim Micro: Rechargeable. The battery lasts 15 years on average and must be charged routinely. Approximately 50% smaller implant than lnterStim X implant.  Manufacturer: Axonics Website: Findrealrelief.axonics.com Options: Non-rechargeable (Axonics F15): The battery lasts 15 years on average. Rechargeable (Axonics R20): The battery lasts 20 years on  average and must be charged in office for about 1 hour every 6-10 months on average. Approximately 50% smaller implant than Axonics non-rechargeable implant.  Note: Generally the rechargeable devices are only advised for very small or thin patients who may not have sufficient adipose tissue to comfortably overlay the implanted device.  Suprapubic catheter (SP tube) placement. Only done in severely refractory OAB when all other options have failed or are not a viable treatment choice depending on patient factors. Involves placement of a catheter through the lower abdomen into the bladder to continuously drain the bladder into an external collection bag, which patient can then empty at their convenience every few hours. Done via an outpatient surgical procedure in the OR under anesthesia. Risks may included but are not limited to: surgical site pain, infections, skin irritation / breakdown, chronic bacteriuria, symptomatic UTls. The SP tube must stay in place continuously. This is a reversible procedure however - the insertion site will close if catheter is removed for more than a few hours. The SP tube must be exchanged routinely every 4 weeks to prevent the catheter from becoming clogged with sediment. SP tube exchanges are typically performed at a urology nurse visit or by a home health nurse.  

## 2022-07-20 LAB — URINALYSIS, ROUTINE W REFLEX MICROSCOPIC
Bilirubin, UA: NEGATIVE
Glucose, UA: NEGATIVE
Leukocytes,UA: NEGATIVE
Nitrite, UA: NEGATIVE
Specific Gravity, UA: 1.03 (ref 1.005–1.030)
Urobilinogen, Ur: 0.2 mg/dL (ref 0.2–1.0)
pH, UA: 5.5 (ref 5.0–7.5)

## 2022-07-20 LAB — MICROSCOPIC EXAMINATION: Bacteria, UA: NONE SEEN

## 2022-07-23 ENCOUNTER — Other Ambulatory Visit: Payer: Self-pay | Admitting: Adult Health

## 2022-07-24 ENCOUNTER — Telehealth: Payer: Self-pay | Admitting: Cardiovascular Disease

## 2022-07-24 DIAGNOSIS — M21371 Foot drop, right foot: Secondary | ICD-10-CM | POA: Diagnosis not present

## 2022-07-24 DIAGNOSIS — N179 Acute kidney failure, unspecified: Secondary | ICD-10-CM | POA: Diagnosis not present

## 2022-07-24 DIAGNOSIS — M6281 Muscle weakness (generalized): Secondary | ICD-10-CM | POA: Diagnosis not present

## 2022-07-24 DIAGNOSIS — I480 Paroxysmal atrial fibrillation: Secondary | ICD-10-CM | POA: Diagnosis not present

## 2022-07-24 DIAGNOSIS — G9341 Metabolic encephalopathy: Secondary | ICD-10-CM | POA: Diagnosis not present

## 2022-07-24 DIAGNOSIS — N390 Urinary tract infection, site not specified: Secondary | ICD-10-CM | POA: Diagnosis not present

## 2022-07-24 MED ORDER — APIXABAN 5 MG PO TABS: 5.0000 mg | ORAL_TABLET | Freq: Two times a day (BID) | ORAL | 1 refills | Status: DC

## 2022-07-24 NOTE — Progress Notes (Signed)
  Care Coordination Note  07/24/2022 Name: James Dyer MRN: [id number] DOB: [birth year]  James Dyer is a 77 y.o. year old male who is a primary care patient of [name], MD and is actively engaged with the care management team. I reached out to [name] by phone today to assist with re-scheduling a follow up visit with the RN Case Manager  Follow up plan: Telephone appointment with care management team member scheduled for:07/31/22  [facility] Coordination Care Guide  Direct Dial: [phone]

## 2022-07-24 NOTE — Telephone Encounter (Signed)
Pt c/o medication issue:  1. Name of Medication: apixaban (ELIQUIS) 5 MG TABS tablet   2. How are you currently taking this medication (dosage and times per day)?   3. Are you having a reaction (difficulty breathing--STAT)?   4. What is your medication issue? Patient is requesting a this medication be filled by his cardiologist office. Requesting call back to confirm this will be filled.

## 2022-07-24 NOTE — Telephone Encounter (Signed)
Prescription refill request for Eliquis received. Indication: AF Last office visit: 06/12/22  Shawnie Dapper NP Scr: 1.00 Age: 77 Weight: 109.4kg  Based on above findings Eliquis 5mg  twice daily is the appropriate dose.  Refill approved.

## 2022-07-27 ENCOUNTER — Other Ambulatory Visit (HOSPITAL_COMMUNITY): Payer: Medicare Other

## 2022-07-30 ENCOUNTER — Telehealth: Payer: Self-pay | Admitting: Nurse Practitioner

## 2022-07-30 ENCOUNTER — Encounter: Payer: Self-pay | Admitting: Nurse Practitioner

## 2022-07-30 ENCOUNTER — Ambulatory Visit: Payer: Medicare Other | Attending: Nurse Practitioner | Admitting: Nurse Practitioner

## 2022-07-30 VITALS — BP 120/72 | HR 67 | Ht 69.0 in | Wt 243.8 lb

## 2022-07-30 DIAGNOSIS — I4819 Other persistent atrial fibrillation: Secondary | ICD-10-CM | POA: Diagnosis not present

## 2022-07-30 DIAGNOSIS — I7121 Aneurysm of the ascending aorta, without rupture: Secondary | ICD-10-CM | POA: Insufficient documentation

## 2022-07-30 DIAGNOSIS — I10 Essential (primary) hypertension: Secondary | ICD-10-CM | POA: Insufficient documentation

## 2022-07-30 DIAGNOSIS — Z87898 Personal history of other specified conditions: Secondary | ICD-10-CM | POA: Diagnosis not present

## 2022-07-30 DIAGNOSIS — E785 Hyperlipidemia, unspecified: Secondary | ICD-10-CM | POA: Diagnosis not present

## 2022-07-30 NOTE — Telephone Encounter (Signed)
Gave Patient assistance forms for Eliquis to be filled out and returned to the [place] office. 07/30/22 [place] [facility] CoPay:$47.00 monthly

## 2022-07-30 NOTE — Progress Notes (Signed)
Cardiology Office Note:  .   Date:  07/30/2022  ID:  James Dyer, DOB 1945/03/20, MRN [id number] PCP: [name], MD  [facility] Providers Cardiologist:  Charlton Haws, MD    History of Present Illness: .   James Dyer is a 77 y.o. male with a PMH of persistent A-fib, hypertension, hyperlipidemia, type 2 diabetes, GERD, dysphagia, hx of prostate cancer, hx of syncope and collapse, hx of ascending aortic aneurysm, and chronic neuromuscular symptoms and bilateral foot drop, who presents today for scheduled follow-up.   Previous CV hx includes no known CAD, was found to have nonischemic Myoview in 2015.  Found to be in asymptomatic A-fib in 2021.  TTE EF 50 to 55%, no significant valvular disease.  Did undergo cardioversion with Dr. Royann Shivers in 2023, but did have early recurrence of A-fib at OV follow-up post DCCV.   Admitted 04/2022 d/t AMS and fall at home, also had AKI. Found to have mild rhabdomyolysis, nontraumatic, and resolved with IV fluid resuscitation. Also found to have acute UTI/retention. Hospital course complicated by acute metabolic encephalopathy that was felt to be likely due to hypoglycemia. MRI of the brain was negative for any abnormalities.  Last saw pt for hospital follow-up on Jun 12, 2022. Said he fell due to a syncopal episode at home while walking to the bathroom, no warning signs/symptoms prior to event. Noted fatigue and dizziness, denied any orthostatic dizziness. Was working with PT.   Today he presents for follow-up.  Doing well.  Has not had any more syncopal episodes.  Son states that his last syncopal episode was due to low blood sugar, blood sugar on arrival to ED was 38.  Patient now has Libre freestyle continuous glucose monitoring device, patient states his readings have been labile. Denies any chest pain, shortness of breath, palpitations, syncope, presyncope, dizziness, orthopnea, PND, swelling or significant weight changes, acute bleeding, or  claudication.  Son states patient occasionally eats foods high in sugar.   Studies Reviewed: .    Cardiac monitor 06/2022:  Patch Wear Time:  14 days and 0 hours (2024-05-14T16:42:13-0400 to 2024-05-28T16:42:13-0400)   Atrial Fibrillation occurred continuously (100% burden), ranging from 40-126 bpm (avg of 71 bpm). Isolated VEs were rare (<1.0%), VE Couplets were rare (<1.0%), and no VE Triplets were present. Ventricular Bigeminy and Trigeminy were present.   Echo 05/2022:  1. Left ventricular ejection fraction, by estimation, is 50 to 55%. The  left ventricle has low normal function. The left ventricle has no regional  wall motion abnormalities. Left ventricular diastolic parameters are  indeterminate.   2. Right ventricular systolic function is normal. The right ventricular  size is normal. Tricuspid regurgitation signal is inadequate for assessing  PA pressure.   3. Left atrial size was moderately dilated.   4. The mitral valve is abnormal. Mild mitral valve regurgitation. No  evidence of mitral stenosis.   5. The aortic valve is tricuspid. There is mild calcification of the  aortic valve. There is mild thickening of the aortic valve. Aortic valve  regurgitation is mild. No aortic stenosis is present.   6. Aortic dilatation noted. There is moderate dilatation of the ascending  aorta, measuring 44 mm.   7. The inferior vena cava is normal in size with greater than 50%  respiratory variability, suggesting right atrial pressure of 3 mmHg.  Risk Assessment/Calculations:    CHA2DS2-VASc Score = 4   This indicates a 4.8% annual risk of stroke. The patient's score is based  upon: CHF History: 0 HTN History: 1 Diabetes History: 1 Stroke History: 0 Vascular Disease History: 0 Age Score: 2 Gender Score: 0      Physical Exam:   VS:  BP 120/72   Pulse 67   Ht 5\' 9"  (1.753 m)   Wt 243 lb 12.8 oz (110.6 kg)   SpO2 96%   BMI 36.00 kg/m    Wt Readings from Last 3 Encounters:   07/30/22 243 lb 12.8 oz (110.6 kg)  06/12/22 241 lb 3.2 oz (109.4 kg)  05/29/22 226 lb 9.6 oz (102.8 kg)    GEN: Obese, 77 year old male in no acute distress NECK: No JVD; No carotid bruits CARDIAC: S1/S2, irregular rhythm and regular rate, no murmurs, rubs, gallops RESPIRATORY:  Clear to auscultation without rales, wheezing or rhonchi  EXTREMITIES:  No edema; No deformity   ASSESSMENT AND PLAN: .   Hx of syncope and collapse Etiology d/t hypoglycemia. Denies any recurrent episodes. Workup overall unremarkable. No medication changes at this time. Continue to follow with PCP. Care and ED precautions discussed.    Persistent A-fib Denies any tachycardia or palpitations. HR well controlled today. Continue Lopressor and Eliquis 5 mg BID, on appropriate dosage and denies any bleeding issues. Heart healthy diet encouraged.    HTN BP stable. Discussed to monitor BP at home at least 2 hours after medications and sitting for 5-10 minutes. Continue current medication regimen. Heart healthy diet encouraged.    HLD LDL 152 08/2021. Currently on Zetia. At next visit, will address starting a statin if no improvement with next labs drawn by PCP. Heart healthy diet encouraged.    5. Ascending aortic aneurysm Noted on CT 04/2022, measuring 4.1 cm. Recommended for annual imaging. It was recommended he follow-up with PCP for CTA and with vascular surgery for further imaging outpatient. If not arranged by PCP, will arrange repeat imaging for 04/2023 . Care and ED precautions discussed.      Dispo: Follow-up with Dr. Eden Emms or APP in 6 months or sooner if anything changes.  Signed, Sharlene Dory, NP

## 2022-07-30 NOTE — Telephone Encounter (Signed)
Gave Patient assistance forms for Eliquis to be filled out and returned to the [place] office. 07/30/22

## 2022-07-30 NOTE — Patient Instructions (Addendum)
Medication Instructions:  Your physician recommends that you continue on your current medications as directed. Please refer to the Current Medication list given to you today.  Labwork: none  Testing/Procedures: none  Follow-Up: Your physician recommends that you schedule a follow-up appointment in: 6 Months   Any Other Special Instructions Will Be Listed Below (If Applicable). Patient assistance for Eliquis Paperwork given.  If you need a refill on your cardiac medications before your next appointment, please call your pharmacy.

## 2022-07-31 ENCOUNTER — Ambulatory Visit: Payer: Self-pay | Admitting: *Deleted

## 2022-07-31 ENCOUNTER — Encounter: Payer: Self-pay | Admitting: *Deleted

## 2022-07-31 NOTE — Patient Outreach (Signed)
Care Coordination   Follow Up Visit Note   07/31/2022 Name: James Dyer MRN: [id number] DOB: 01/16/1946  James Dyer is a 77 y.o. year old male who sees [name], MD for primary care. I spoke with  [name] by phone today.  What matters to the patients health and wellness today?  Managing blood sugar and getting Eliquis and freestyle libre at a better cost. Working with cardiology office re: cost of Eliquis.    Goals Addressed             This Visit's Progress    Manage Blood Sugar and Diabetes   On track    Care Coordination Goals: Patient will follow-up with PCP and/or endocrinologist every 3 months or as recommended Patient will take medication as prescribed and reach out to provider with any negative side effects Patient will continue to hold glimepiride as instructed Patient will continue to monitor and record blood sugar daily and as needed with Freestyle Libre CGM, and will call PCP or endocrinologist with any readings outside of recommended range Patient will take blood sugar log and meter to provider visits for review Patient will review list of Medicare approved diabetic supply DME providers and check to see if they can provide his sensors cheaper than local pharmacy. He doesn't qualify for insurance coverage b/c he does not use insulin.  Patient will follow a modified carbohydrate diet and decrease simple carbohydrates and sugars Patient will increase activity level as tolerated with an ultimate goal of at least 150 minutes of exercise per week Patient will check feet daily for sores, wounds, calluses, etc and will notify provider of any abnormal findings Patient will have yearly eye exams to check for or monitor diabetic retinopathy Patient will reach out to RN Care Coordinator [phone] with any care coordination or resource needs         SDOH assessments and interventions completed:  Yes  SDOH Interventions Today    Flowsheet Row Most  Recent Value  SDOH Interventions   Transportation Interventions Intervention Not Indicated  Financial Strain Interventions Intervention Not Indicated        Care Coordination Interventions:  Yes, provided  Interventions Today    Flowsheet Row Most Recent Value  Chronic Disease   Chronic disease during today's visit Diabetes  General Interventions   General Interventions Discussed/Reviewed General Interventions Discussed, General Interventions Reviewed, Labs, Annual Foot Exam, Annual Eye Exam, Durable Medical Equipment (DME), Doctor Visits  [Blood sugar is averaging between 100 and 175. Uses Freestyle Libre with alarms set for highs and lows.]  Labs Hgb A1c every 3 months  Doctor Visits Discussed/Reviewed Doctor Visits Discussed, Doctor Visits Reviewed, PCP, Specialist  Durable Medical Equipment (DME) Glucomoter  Dorathy Daft 3]  PCP/Specialist Visits Compliance with follow-up visit  Exercise Interventions   Exercise Discussed/Reviewed Physical Activity  Physical Activity Discussed/Reviewed Physical Activity Discussed, Physical Activity Reviewed  Education Interventions   Education Provided Provided Education  Provided Verbal Education On Nutrition, Mental Health/Coping with Illness, When to see the doctor, Medication, Blood Sugar Monitoring, Labs, Exercise  Labs Reviewed Hgb A1c  Nutrition Interventions   Nutrition Discussed/Reviewed Nutrition Discussed, Nutrition Reviewed, Carbohydrate meal planning, Portion sizes, Fluid intake, Decreasing sugar intake, Increasing proteins, Adding fruits and vegetables  Pharmacy Interventions   Pharmacy Dicussed/Reviewed Pharmacy Topics Discussed, Pharmacy Topics Reviewed, Medications and their functions  [still holding glimeperide but has taken 1 tablet and then 1/2 tablet on two different occassions for blood sugar >200]  Safety Interventions  Safety Discussed/Reviewed Safety Discussed, Safety Reviewed       Follow up plan: Follow up call  scheduled for 10/08/22    Encounter Outcome:  Pt. Visit Completed   Demetrios Loll, BSN, RN-BC RN Care Coordinator [facility]  [facility] Direct Dial: [phone] Main #: [phone]

## 2022-08-27 NOTE — Telephone Encounter (Signed)
James Dyer with [employer] called in stating there is some missing info  on pt's applications    Provider :  Verify if pt if outpatient or inpatient (he can accept verbally or written)  Prescription says 5 mg but they need to know the frequency    Patient :  Received pages 1 & 2, however pages 3 & 4 are missing. (Needs to verify his Address)  Missing consent (signature) page   Please advise.

## 2022-09-03 DIAGNOSIS — H25813 Combined forms of age-related cataract, bilateral: Secondary | ICD-10-CM | POA: Diagnosis not present

## 2022-09-03 DIAGNOSIS — H524 Presbyopia: Secondary | ICD-10-CM | POA: Diagnosis not present

## 2022-09-03 DIAGNOSIS — H01001 Unspecified blepharitis right upper eyelid: Secondary | ICD-10-CM | POA: Diagnosis not present

## 2022-09-03 DIAGNOSIS — H01002 Unspecified blepharitis right lower eyelid: Secondary | ICD-10-CM | POA: Diagnosis not present

## 2022-09-03 DIAGNOSIS — E119 Type 2 diabetes mellitus without complications: Secondary | ICD-10-CM | POA: Diagnosis not present

## 2022-09-11 ENCOUNTER — Other Ambulatory Visit: Payer: Self-pay | Admitting: *Deleted

## 2022-09-11 MED ORDER — APIXABAN 5 MG PO TABS: 5.0000 mg | ORAL_TABLET | Freq: Two times a day (BID) | ORAL | 3 refills | Status: DC

## 2022-09-17 ENCOUNTER — Other Ambulatory Visit: Payer: Self-pay | Admitting: Cardiovascular Disease

## 2022-09-24 ENCOUNTER — Other Ambulatory Visit: Payer: Self-pay | Admitting: Nurse Practitioner

## 2022-10-08 ENCOUNTER — Ambulatory Visit: Payer: Self-pay | Admitting: *Deleted

## 2022-10-08 ENCOUNTER — Encounter: Payer: Self-pay | Admitting: *Deleted

## 2022-10-09 NOTE — Patient Outreach (Signed)
Care Coordination   Follow Up Visit Note   10/08/2022 Name: James Dyer MRN: [id number] DOB: 08/04/1945  James Dyer is a 77 y.o. year old male who sees [name], MD for primary care. I spoke with  [name] by phone today.  What matters to the patients health and wellness today?  Managing urinary incontinence     Goals Addressed             This Visit's Progress    Follow-up with Urologist Regarding Incontinence       Care Coordination Goals: Patient has an appointment with Dr Retta Diones in December but needs to be seen sooner for urinary incontinence Staff message sent to [facility] in [place] Patient will talk with urologist about incontinence supply options Considering a type of condom catheter that is only applied at the end of the penis and causes less irritation than the traditional ones. Will need an order.  Discuss other medication options or treatments, like pelvic floor physical therapy, that may be beneficial Patient will reach out to RN Care Management Coordinator with any resource or care coordination needs     Manage Blood Sugar and Diabetes   On track    Care Coordination Goals: Keep appointment with Dr Phillips Odor in October for Annual Wellness Visit Patient will take medication as prescribed and reach out to provider with any negative side effects Patient will continue to monitor and record blood sugar daily and as needed with Freestyle Libre CGM, and will call PCP any readings less than 70 or greater than 200 Patient will take blood sugar log and meter to provider visits for review  Patient will reach out to RN Care Coordinator [phone] with any care coordination or resource needs         SDOH assessments and interventions completed:  Yes  SDOH Interventions Today    Flowsheet Row Most Recent Value  SDOH Interventions   Transportation Interventions Intervention Not Indicated  Financial Strain Interventions Intervention  Not Indicated  Physical Activity Interventions [employer]        Care Coordination Interventions:  Yes, provided  Interventions Today    Flowsheet Row Most Recent Value  Chronic Disease   Chronic disease during today's visit Diabetes, Atrial Fibrillation (AFib), Other  [urinary incontinence s/p prostate cancer]  General Interventions   General Interventions Discussed/Reviewed General Interventions Discussed, General Interventions Reviewed, Labs, Durable Medical Equipment (DME), Doctor Visits, Communication with  Labs Hgb A1c every 3 months  Doctor Visits Discussed/Reviewed Doctor Visits Discussed, Doctor Visits Reviewed, PCP, Specialist  Durable Medical Equipment (DME) Glucomoter, Other, Lift Chair  [incontinence pads. Would like an order from urologist for an external urine collection system similar to a condom catheter, unsure of name. Blood sugar was 250 this morning but is generally less than 150. No readings less than 70.]  PCP/Specialist Visits Compliance with follow-up visit  [Annual Wellness Visit with Dr Phillips Odor in October. Dr Retta Diones (urologist) 01/15/23. Sharlene Dory, NP (cardiologist) on 02/01/23.]  Communication with PCP/Specialists  [Staff message to [facility] [place] Clinical pool regarding patient's need to follow-up with someone regarding incontinence sooner than his scheduled appointment in December.]  Education Interventions   Education Provided Provided Education  Provided Verbal Education On Mental Health/Coping with Illness, Blood Sugar Monitoring, Medication, Applications, When to see the doctor, Nutrition  [Pt. was given applications for patient assistance for eliquis by cardiologist but hasn't turned it back in. Encouraged to do so. Assistance is only valid  through the end of the year.]  Mental Health Interventions   Mental Health Discussed/Reviewed Mental Health Discussed, Mental Health Reviewed, Coping Strategies, Other  [Pt. is frustrated with urinary  incontinence since having radiation for prostate cancer. Often he can't tell that he needs to urinate. It just runs out. Therapeutic listening utilized. Encouraged to talk with urologist Re: management options.]  Nutrition Interventions   Nutrition Discussed/Reviewed Nutrition Discussed, Nutrition Reviewed, Carbohydrate meal planning, Portion sizes, Decreasing sugar intake, Increasing proteins, Adding fruits and vegetables, Fluid intake  [Eat 3 meals per day with 30 GM of CHO per meal and up to 2 snacks per day with less than 15 GM of CHO.]  Pharmacy Interventions   Pharmacy Dicussed/Reviewed Pharmacy Topics Discussed, Pharmacy Topics Reviewed, Medications and their functions  [Has been able to decrease Libre sensor cost to $80 per month. Urologist switched vesicare to myrbetriq because it wasn't working. patient stopped myrbetriq because he was urinating more. He  started an OTC supplement, Super Beta Prostate Advanced.]  Safety Interventions   Safety Discussed/Reviewed Safety Discussed, Safety Reviewed       Follow up plan: Follow up call scheduled for 11/08/22    Encounter Outcome:  Patient Visit Completed   Demetrios Loll, RN, BSN Care Management Coordinator [facility]  [facility] Direct Dial: [phone] Main #: [phone]  *Late entry for telephone call on 10/08/22

## 2022-10-26 NOTE — Progress Notes (Signed)
History of Present Illness:   This man is here today for follow-up of the below listed issues:  1.  History of prostate cancer.  10.25.2012: TRUS/Bx. Prostate volume 38 mL, PSA density 0.10.  1/12 cores revealed GS 3+4 pattern.  He completed IMRT on 5.8.2013.    2.  History of urolithiasis.  Had lithotripsy for a left upper ureteral stone on 03/04/2017.     3.  Overactive bladder symptoms.   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 Renette Butters Rx Welchol- pt. hs stopped taking because he believe it gives him  leg cramps.   Hypertension    Nephrolithiasis    renal calculi- , also has been told that he has a cyst on kidney - R side    Neuromuscular disorder (HCC)    bilateral foot drop- both feet    Paroxysmal atrial fibrillation (HCC)     Past Surgical History:  Procedure Laterality Date   BACK SURGERY     CARDIOVERSION N/A 02/28/2021   Procedure: CARDIOVERSION;  Surgeon: Thurmon Fair, MD;  Location: [facility];  Service: Cardiovascular;  Laterality: N/A;   COLONOSCOPY N/A 07/22/2012   Procedure: COLONOSCOPY;  Surgeon: Dalia Heading, MD;  Location: [facility];  Service: Gastroenterology;  Laterality: N/A;   ESOPHAGEAL DILATION N/A 04/22/2019   Procedure: ESOPHAGEAL DILATION;  Surgeon: Malissa Hippo, MD;  Location: [facility];  Service: Endoscopy;  Laterality: N/A;   ESOPHAGOGASTRODUODENOSCOPY N/A 04/22/2019   Procedure: ESOPHAGOGASTRODUODENOSCOPY (EGD);   Surgeon: Malissa Hippo, MD;  Location: [facility];  Service: Endoscopy;  Laterality: N/A;  145   EXTRACORPOREAL SHOCK WAVE LITHOTRIPSY Left 03/04/2017   Procedure: LEFT EXTRACORPOREAL SHOCK WAVE LITHOTRIPSY (ESWL);  Surgeon: Rene Paci, MD;  Location: [facility];  Service: Urology;  Laterality: Left;   FOOT SURGERY Right 1980's   calcium deposit removed   LUMBAR LAMINECTOMY/DECOMPRESSION MICRODISCECTOMY Right 09/01/2013   Procedure: RIGHT LUMBAR FOUR-FIVE LAMINECTOMY/DECOMPRESSION MICRODISCECTOMY 1 LEVEL;  Surgeon: Coletta Memos, MD;  Location: [facility];  Service: Neurosurgery;  Laterality: Right;  Right L45 microdiskectomy   prostate biopsy  2013   SHOULDER OPEN ROTATOR CUFF REPAIR Left 09/09/2012   Procedure: LEFT ROTATOR CUFF REPAIR SHOULDER OPEN;  Surgeon: Jacki Cones, MD;  Location: [facility];  Service: Orthopedics;  Laterality: Left;    Home Medications:  Allergies as of 10/29/2022       Reactions   Latex Other (See Comments)   "sores in mouth after teeth cleaned"        Medication List        Accurate as of October 26, 2022 10:20 AM. If you have any questions, ask your nurse or doctor.          amLODipine 5 MG tablet Commonly known as: NORVASC TAKE 1 TABLET(5 MG) BY MOUTH DAILY   apixaban  5 MG Tabs tablet Commonly known as: ELIQUIS Take 1 tablet (5 mg total) by mouth 2 (two) times daily.   donepezil 5 MG tablet Commonly known as: ARICEPT Take 5 mg by mouth daily.   ezetimibe 10 MG tablet Commonly known as: ZETIA Take 1 tablet (10 mg total) by mouth daily.   losartan 50 MG tablet Commonly known as: COZAAR Take 1 tablet (50 mg total) by mouth daily.   metoprolol tartrate 25 MG tablet Commonly known as: LOPRESSOR TAKE 1 TABLET(25 MG) BY MOUTH TWICE DAILY   mirabegron ER 25 MG Tb24 tablet Commonly known as: MYRBETRIQ Take 2 tablets (50 mg total) by mouth daily.   OVER THE COUNTER MEDICATION Take 1 tablet by mouth 2 (two) times daily.  Super Beta Prostate Advance        Allergies:  Allergies  Allergen Reactions   Latex Other (See Comments)    "sores in mouth after teeth cleaned"    No family history on file.  Social History:  reports that he quit smoking about 55 years ago. His smoking use included cigarettes. He started smoking about 60 years ago. He has a 5 pack-year smoking history. He has never used smokeless tobacco. He reports current alcohol use. He reports that he does not use drugs.  ROS: A complete review of systems was performed.  All systems are negative except for pertinent findings as noted.  Physical Exam:  Vital signs in last 24 hours: There were no vitals taken for this visit. Constitutional:  Alert and oriented, No acute distress Cardiovascular: Regular rate  Respiratory: Normal respiratory effort GI: Abdomen is soft, nontender, nondistended, no abdominal masses. No CVAT.  Genitourinary: Normal male phallus, testes are descended bilaterally and non-tender and without masses, scrotum is normal in appearance without lesions or masses, perineum is normal on inspection. Lymphatic: No lymphadenopathy Neurologic: Grossly intact, no focal deficits Psychiatric: Normal mood and affect  I have reviewed prior pt notes  I have reviewed notes from referring/previous physicians  I have reviewed urinalysis results  I have independently reviewed prior imaging  I have reviewed prior PSA results  I have reviewed prior urine culture   Impression/Assessment:  ***  Plan:  ***

## 2022-10-29 ENCOUNTER — Encounter: Payer: Self-pay | Admitting: Urology

## 2022-10-29 ENCOUNTER — Ambulatory Visit (INDEPENDENT_AMBULATORY_CARE_PROVIDER_SITE_OTHER): Payer: Medicare Other | Admitting: Urology

## 2022-10-29 VITALS — BP 129/81 | HR 85

## 2022-10-29 DIAGNOSIS — Z8546 Personal history of malignant neoplasm of prostate: Secondary | ICD-10-CM

## 2022-10-29 DIAGNOSIS — N3281 Overactive bladder: Secondary | ICD-10-CM

## 2022-10-29 DIAGNOSIS — Z87442 Personal history of urinary calculi: Secondary | ICD-10-CM | POA: Diagnosis not present

## 2022-10-29 DIAGNOSIS — N200 Calculus of kidney: Secondary | ICD-10-CM | POA: Diagnosis not present

## 2022-10-29 LAB — URINALYSIS, ROUTINE W REFLEX MICROSCOPIC
Bilirubin, UA: NEGATIVE
Glucose, UA: NEGATIVE
Ketones, UA: NEGATIVE
Leukocytes,UA: NEGATIVE
Nitrite, UA: NEGATIVE
Specific Gravity, UA: 1.03 (ref 1.005–1.030)
Urobilinogen, Ur: 1 mg/dL (ref 0.2–1.0)
pH, UA: 6 (ref 5.0–7.5)

## 2022-10-29 LAB — MICROSCOPIC EXAMINATION: Bacteria, UA: NONE SEEN

## 2022-10-29 MED ORDER — SOLIFENACIN SUCCINATE 10 MG PO TABS
10.0000 mg | ORAL_TABLET | Freq: Every day | ORAL | 11 refills | Status: DC
Start: 2022-10-29 — End: 2023-07-22

## 2022-11-08 ENCOUNTER — Ambulatory Visit: Payer: Self-pay | Admitting: *Deleted

## 2022-11-08 ENCOUNTER — Encounter: Payer: Self-pay | Admitting: *Deleted

## 2022-11-13 ENCOUNTER — Ambulatory Visit: Payer: Self-pay | Admitting: *Deleted

## 2022-11-13 ENCOUNTER — Encounter: Payer: Self-pay | Admitting: *Deleted

## 2022-11-15 NOTE — Patient Outreach (Signed)
Care Coordination   Follow Up Visit Note   11/08/2022 Name: James Dyer MRN: [id number] DOB: March 11, 1945  James Dyer is a 77 y.o. year old male who sees [name], MD for primary care. I spoke with  [name] by phone today.  What matters to the patients health and wellness today?  Managing urinary incontinence and getting libre sensors    Goals Addressed             This Visit's Progress    Follow-up with Urologist Regarding Incontinence   On track    Care Coordination Goals: Patient has an appointment with Dr Retta Diones on 11/27/22 for follow-up on incontinence Patient will talk with urologist about incontinence management options Patient will reach out to RN Care Management Coordinator with any resource or care coordination needs     Manage Blood Sugar and Diabetes   On track    Care Coordination Goals: Keep appointment with Dr Phillips Odor in October for Annual Wellness Visit Patient will take medication as prescribed and reach out to provider with any negative side effects Patient will check blood sugar 3 times per day and as needed with glucometer for now until he is able to get [place] sensors refilled, and will call PCP any readings less than 70 or greater than 200 Patient will continue to work with Maxwell Caul, PharmD at [facility] Re: order for [facility] 3+ sensors Was using Libre 3 but was told by pharmacy that insurance declined and prefers 3+ Patient will take blood sugar log and meter to provider visits for review  Patient will reach out to [facility] Coordinator [phone] with any care coordination or resource needs         SDOH assessments and interventions completed:  Yes  SDOH Interventions Today    Flowsheet Row Most Recent Value  SDOH Interventions   Transportation Interventions Intervention Not Indicated  Financial Strain Interventions Intervention Not Indicated        Care Coordination Interventions:  Yes, provided   Interventions Today    Flowsheet Row Most Recent Value  Chronic Disease   Chronic disease during today's visit Diabetes, Other  [urinary incontinence secondary to history of bladder cancer]  General Interventions   General Interventions Discussed/Reviewed General Interventions Discussed, General Interventions Reviewed, Labs, Doctor Visits, Durable Medical Equipment (DME)  Labs Hgb A1c every 3 months  Doctor Visits Discussed/Reviewed Doctor Visits Discussed, Doctor Visits Reviewed, PCP, Specialist, Annual Wellness Visits  [reviewed and discussed recent urology visit and plan for incontinence management]  Durable Medical Equipment (DME) Glucomoter  [using Libre 3 but pharmacy is unable to fill. Recommends Libre 3+. Working with Maxwell Caul, PharmD with [facility] to resolve this.]  PCP/Specialist Visits Compliance with follow-up visit  [11/27/22 with Dr Dahlstedt]  Education Interventions   Education Provided Provided Education  Provided Verbal Education On Blood Sugar Monitoring, When to see the doctor, Exercise, Medication  Mental Health Interventions   Mental Health Discussed/Reviewed Mental Health Discussed, Mental Health Reviewed, Other  [Has some distress Re: urinary incontinence. Therapeutic listening utillized]  Pharmacy Interventions   Pharmacy Dicussed/Reviewed Pharmacy Topics Discussed, Pharmacy Topics Reviewed, Medications and their functions  [Unsure if insurance is covering [place] sensors because he was able to use a savings card. Unsure as to why he can't fill libre 3 through [employer]. Continue working with PharmD.]       Follow up plan: Follow up call scheduled for 11/13/22    Encounter Outcome:  Patient Visit Completed  Demetrios Loll, RN, BSN Care Management Coordinator [facility]  [facility] Direct Dial: [phone] Main #: [phone]

## 2022-11-21 NOTE — Patient Outreach (Signed)
Care Coordination   Follow Up Visit Note   11/13/2022 Name: James Dyer MRN: [id number] DOB: 10-27-1945  James Dyer is a 77 y.o. year old male who sees [name], MD for primary care. I spoke with  [name] by phone today.  What matters to the patients health and wellness today?  Getting libre sensors    Goals Addressed             This Visit's Progress    COMPLETED: Follow-up on AAA       Care Coordination Goals: Patient will follow-up with PCP as planned Patient will talk with PCP about referral to vascular surgeon to establish care and monitor AAA Patient will reach out to PCP with any questions regarding AAA Patient will reach out to RN Care Coordinator [phone] with any resource or care coordination needs     COMPLETED: Follow-up with Urologist Regarding Incontinence       Care Coordination Goals: Patient has an appointment with Dr Retta Diones on 11/27/22 for follow-up on incontinence Patient will talk with urologist about incontinence management options Patient will reach out to RN Care Management Coordinator with any resource or care coordination needs  Patient does not have any acute or urgent needs related to this goal and will follow-up with PCP regarding management.      COMPLETED: Manage Blood Sugar and Diabetes       Care Coordination Goals: Keep appointment with Dr Phillips Odor in October for Annual Wellness Visit Patient will take medication as prescribed and reach out to provider with any negative side effects Patient will check blood sugar 3 times per day and as needed with glucometer for now until he is able to get [place] sensors refilled, and will call PCP any readings less than 70 or greater than 200 Patient will continue to work with Maxwell Caul, PharmD at [facility] Re: order for [facility] 3+ sensors Was using Libre 3 but was told by pharmacy that insurance declined and prefers 3+ Patient will take blood sugar log and meter to  provider visits for review  Patient will reach out to [facility] Coordinator [phone] with any care coordination or resource needs  Patient does not have any acute or urgent needs related to this goal and will follow-up with PCP regarding management.         SDOH assessments and interventions completed:  Yes  SDOH Interventions Today    Flowsheet Row Most Recent Value  SDOH Interventions   Transportation Interventions Intervention Not Indicated  Financial Strain Interventions Intervention Not Indicated        Care Coordination Interventions:  Yes, provided  Interventions Today    Flowsheet Row Most Recent Value  Chronic Disease   Chronic disease during today's visit Diabetes, Other  [urinary incontinence secondary to history of prostate cancer]  General Interventions   General Interventions Discussed/Reviewed General Interventions Discussed, General Interventions Reviewed, Doctor Visits, Durable Medical Equipment (DME)  Labs Hgb A1c every 3 months  Doctor Visits Discussed/Reviewed Doctor Visits Discussed, Doctor Visits Reviewed, Annual Wellness Visits, PCP, Specialist  Durable Medical Equipment (DME) Other  [using Libre 3 but pharmacy is unable to fill. Recommends Libre 3+. Working with Maxwell Caul, PharmD with [facility] to resolve this.]  PCP/Specialist Visits Compliance with follow-up visit  [11/27/22 with Dr Retta Diones. Follow-up with PCP as directed and for any care management needs.]  Education Interventions   Education Provided Provided Education  Provided Verbal Education On When to see the doctor  Pharmacy  Interventions   Pharmacy Dicussed/Reviewed Pharmacy Topics Discussed, Pharmacy Topics Reviewed  Safety Interventions   Safety Discussed/Reviewed Safety Discussed, Safety Reviewed       Follow up plan: No further intervention required. Patient's Primary Care office is not partnering with the [facility] for care management and will be providing Care Management Services  themselves. This final Care Management note will be securely faxed to the PCP office for handoff. Patient has been encouraged to reach out to their PCP office with any resource or care management needs.    Encounter Outcome:  Patient Visit Completed   Demetrios Loll, RN, BSN Care Management Coordinator [facility]  [facility] Direct Dial: [phone] Main #: [phone]

## 2022-11-23 DIAGNOSIS — Z1331 Encounter for screening for depression: Secondary | ICD-10-CM | POA: Diagnosis not present

## 2022-11-23 DIAGNOSIS — I480 Paroxysmal atrial fibrillation: Secondary | ICD-10-CM | POA: Diagnosis not present

## 2022-11-23 DIAGNOSIS — E782 Mixed hyperlipidemia: Secondary | ICD-10-CM | POA: Diagnosis not present

## 2022-11-23 DIAGNOSIS — Z0001 Encounter for general adult medical examination with abnormal findings: Secondary | ICD-10-CM | POA: Diagnosis not present

## 2022-11-23 DIAGNOSIS — E7849 Other hyperlipidemia: Secondary | ICD-10-CM | POA: Diagnosis not present

## 2022-11-23 DIAGNOSIS — I10 Essential (primary) hypertension: Secondary | ICD-10-CM | POA: Diagnosis not present

## 2022-11-23 DIAGNOSIS — E11649 Type 2 diabetes mellitus with hypoglycemia without coma: Secondary | ICD-10-CM | POA: Diagnosis not present

## 2022-11-23 DIAGNOSIS — D6869 Other thrombophilia: Secondary | ICD-10-CM | POA: Diagnosis not present

## 2022-11-23 DIAGNOSIS — E6609 Other obesity due to excess calories: Secondary | ICD-10-CM | POA: Diagnosis not present

## 2022-11-23 DIAGNOSIS — Z23 Encounter for immunization: Secondary | ICD-10-CM | POA: Diagnosis not present

## 2022-11-23 DIAGNOSIS — E538 Deficiency of other specified B group vitamins: Secondary | ICD-10-CM | POA: Diagnosis not present

## 2022-11-23 DIAGNOSIS — Z6837 Body mass index (BMI) 37.0-37.9, adult: Secondary | ICD-10-CM | POA: Diagnosis not present

## 2022-11-23 DIAGNOSIS — Z125 Encounter for screening for malignant neoplasm of prostate: Secondary | ICD-10-CM | POA: Diagnosis not present

## 2022-11-26 NOTE — Progress Notes (Signed)
History of Present Illness:   This man is here today for follow-up of the below listed issues:  1.  History of prostate cancer.  10.25.2012: TRUS/Bx. Prostate volume 38 mL, PSA density 0.10.  1/12 cores revealed GS 3+4 pattern.  He completed IMRT on 5.8.2013.    2.  History of urolithiasis.  Had lithotripsy for a left upper ureteral stone on 03/04/2017.     3.  Overactive bladder symptoms.   10.29.2024: Here for f/u of recent visit for OAB sx's. At visit a month ago he was placed on solifenacin and given behavioral modification instructions.  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 Renette Butters Rx Welchol- pt. hs stopped taking because he believe it gives him  leg cramps.   Hypertension    Nephrolithiasis    renal calculi- , also has been told that he has a cyst on kidney - R side    Neuromuscular disorder (HCC)    bilateral foot drop- both feet    Paroxysmal atrial fibrillation (HCC)     Past Surgical History:  Procedure Laterality Date   BACK SURGERY     CARDIOVERSION N/A 02/28/2021   Procedure: CARDIOVERSION;  Surgeon: Thurmon Fair, MD;  Location: [facility];  Service: Cardiovascular;  Laterality: N/A;   COLONOSCOPY N/A 07/22/2012   Procedure: COLONOSCOPY;  Surgeon: Dalia Heading, MD;  Location: [facility];  Service: Gastroenterology;  Laterality: N/A;   ESOPHAGEAL DILATION N/A 04/22/2019   Procedure: ESOPHAGEAL DILATION;  Surgeon: Malissa Hippo, MD;   Location: [facility];  Service: Endoscopy;  Laterality: N/A;   ESOPHAGOGASTRODUODENOSCOPY N/A 04/22/2019   Procedure: ESOPHAGOGASTRODUODENOSCOPY (EGD);  Surgeon: Malissa Hippo, MD;  Location: [facility];  Service: Endoscopy;  Laterality: N/A;  145   EXTRACORPOREAL SHOCK WAVE LITHOTRIPSY Left 03/04/2017   Procedure: LEFT EXTRACORPOREAL SHOCK WAVE LITHOTRIPSY (ESWL);  Surgeon: Rene Paci, MD;  Location: [facility];  Service: Urology;  Laterality: Left;   FOOT SURGERY Right 1980's   calcium deposit removed   LUMBAR LAMINECTOMY/DECOMPRESSION MICRODISCECTOMY Right 09/01/2013   Procedure: RIGHT LUMBAR FOUR-FIVE LAMINECTOMY/DECOMPRESSION MICRODISCECTOMY 1 LEVEL;  Surgeon: Coletta Memos, MD;  Location: [facility] NEUR[facility];  Service: Neurosurgery;  Laterality: Right;  Right L45 microdiskectomy   prostate biopsy  2013   SHOULDER OPEN ROTATOR CUFF REPAIR Left 09/09/2012   Procedure: LEFT ROTATOR CUFF REPAIR SHOULDER OPEN;  Surgeon: Jacki Cones, MD;  Location: [facility];  Service: Orthopedics;  Laterality: Left;    Home Medications:  Allergies as of 11/27/2022       Reactions   Latex Other (See Comments)   "sores in mouth after teeth cleaned"        Medication List        Accurate as of November 26, 2022  5:07 PM. If you have any questions, ask your nurse or doctor.  amLODipine 5 MG tablet Commonly known as: NORVASC TAKE 1 TABLET(5 MG) BY MOUTH DAILY   apixaban 5 MG Tabs tablet Commonly known as: ELIQUIS Take 1 tablet (5 mg total) by mouth 2 (two) times daily.   donepezil 5 MG tablet Commonly known as: ARICEPT Take 5 mg by mouth daily.   ezetimibe 10 MG tablet Commonly known as: ZETIA Take 1 tablet (10 mg total) by mouth daily.   losartan 50 MG tablet Commonly known as: COZAAR Take 1 tablet (50 mg total) by mouth daily.   metoprolol tartrate 25 MG tablet Commonly known as: LOPRESSOR TAKE 1 TABLET(25 MG) BY MOUTH TWICE DAILY   OVER THE COUNTER  MEDICATION Take 1 tablet by mouth 2 (two) times daily. Super Beta Prostate Advance   solifenacin 10 MG tablet Commonly known as: VESICARE Take 1 tablet (10 mg total) by mouth daily.        Allergies:  Allergies  Allergen Reactions   Latex Other (See Comments)    "sores in mouth after teeth cleaned"    No family history on file.  Social History:  reports that he quit smoking about 55 years ago. His smoking use included cigarettes. He started smoking about 60 years ago. He has a 5 pack-year smoking history. He has never used smokeless tobacco. He reports current alcohol use. He reports that he does not use drugs.  ROS: A complete review of systems was performed.  All systems are negative except for pertinent findings as noted.  Physical Exam:  Vital signs in last 24 hours: There were no vitals taken for this visit. Constitutional:  Alert and oriented, No acute distress Cardiovascular: Regular rate  Respiratory: Normal respiratory effort Neurologic: Grossly intact, no focal deficits Psychiatric: Normal mood and affect  I have reviewed prior pt notes  I have reviewed urinalysis results  I have independently reviewed prior imaging-postvoid residual volume negligible  I have reviewed prior PSA results     Impression/Assessment:  Grade group 2 prostate cancer, status post radiotherapy over 10 years ago with excellent PSA result thus far  Overactive bladder, empties quite well but symptoms are significantly bothersome  History of urolithiasis, no evidence of leak.  Plan:

## 2022-11-27 ENCOUNTER — Ambulatory Visit: Payer: Medicare Other | Admitting: Urology

## 2022-11-27 ENCOUNTER — Encounter: Payer: Self-pay | Admitting: Urology

## 2022-11-27 VITALS — BP 114/73 | HR 77

## 2022-11-27 DIAGNOSIS — Z87442 Personal history of urinary calculi: Secondary | ICD-10-CM | POA: Diagnosis not present

## 2022-11-27 DIAGNOSIS — R350 Frequency of micturition: Secondary | ICD-10-CM

## 2022-11-27 DIAGNOSIS — Z8546 Personal history of malignant neoplasm of prostate: Secondary | ICD-10-CM

## 2022-11-27 DIAGNOSIS — R3915 Urgency of urination: Secondary | ICD-10-CM

## 2022-11-27 DIAGNOSIS — N3281 Overactive bladder: Secondary | ICD-10-CM | POA: Diagnosis not present

## 2022-11-27 DIAGNOSIS — N3941 Urge incontinence: Secondary | ICD-10-CM | POA: Diagnosis not present

## 2022-11-27 LAB — MICROSCOPIC EXAMINATION: Bacteria, UA: NONE SEEN

## 2022-11-27 LAB — URINALYSIS, ROUTINE W REFLEX MICROSCOPIC
Bilirubin, UA: NEGATIVE
Glucose, UA: NEGATIVE
Ketones, UA: NEGATIVE
Leukocytes,UA: NEGATIVE
Nitrite, UA: NEGATIVE
Specific Gravity, UA: 1.03 (ref 1.005–1.030)
Urobilinogen, Ur: 1 mg/dL (ref 0.2–1.0)
pH, UA: 6 (ref 5.0–7.5)

## 2022-11-29 DIAGNOSIS — E11649 Type 2 diabetes mellitus with hypoglycemia without coma: Secondary | ICD-10-CM | POA: Diagnosis not present

## 2022-11-29 DIAGNOSIS — I10 Essential (primary) hypertension: Secondary | ICD-10-CM | POA: Diagnosis not present

## 2022-11-29 DIAGNOSIS — D6869 Other thrombophilia: Secondary | ICD-10-CM | POA: Diagnosis not present

## 2022-11-29 DIAGNOSIS — E782 Mixed hyperlipidemia: Secondary | ICD-10-CM | POA: Diagnosis not present

## 2022-11-29 DIAGNOSIS — I480 Paroxysmal atrial fibrillation: Secondary | ICD-10-CM | POA: Diagnosis not present

## 2022-12-29 DIAGNOSIS — I480 Paroxysmal atrial fibrillation: Secondary | ICD-10-CM | POA: Diagnosis not present

## 2022-12-29 DIAGNOSIS — E118 Type 2 diabetes mellitus with unspecified complications: Secondary | ICD-10-CM | POA: Diagnosis not present

## 2022-12-29 DIAGNOSIS — D6869 Other thrombophilia: Secondary | ICD-10-CM | POA: Diagnosis not present

## 2023-01-15 ENCOUNTER — Ambulatory Visit: Payer: Medicare Other | Admitting: Urology

## 2023-01-31 ENCOUNTER — Ambulatory Visit: Payer: Medicare Other | Admitting: Nurse Practitioner

## 2023-02-01 ENCOUNTER — Ambulatory Visit: Payer: Medicare Other | Attending: Nurse Practitioner | Admitting: Nurse Practitioner

## 2023-02-01 ENCOUNTER — Encounter: Payer: Self-pay | Admitting: Nurse Practitioner

## 2023-02-01 VITALS — BP 130/74 | HR 73 | Ht 69.0 in | Wt 263.0 lb

## 2023-02-01 DIAGNOSIS — I7121 Aneurysm of the ascending aorta, without rupture: Secondary | ICD-10-CM | POA: Diagnosis not present

## 2023-02-01 DIAGNOSIS — Z136 Encounter for screening for cardiovascular disorders: Secondary | ICD-10-CM | POA: Insufficient documentation

## 2023-02-01 DIAGNOSIS — E785 Hyperlipidemia, unspecified: Secondary | ICD-10-CM | POA: Insufficient documentation

## 2023-02-01 DIAGNOSIS — M79605 Pain in left leg: Secondary | ICD-10-CM | POA: Diagnosis not present

## 2023-02-01 DIAGNOSIS — Z87898 Personal history of other specified conditions: Secondary | ICD-10-CM | POA: Diagnosis not present

## 2023-02-01 DIAGNOSIS — M79604 Pain in right leg: Secondary | ICD-10-CM | POA: Insufficient documentation

## 2023-02-01 DIAGNOSIS — I4819 Other persistent atrial fibrillation: Secondary | ICD-10-CM | POA: Diagnosis not present

## 2023-02-01 DIAGNOSIS — I2510 Atherosclerotic heart disease of native coronary artery without angina pectoris: Secondary | ICD-10-CM

## 2023-02-01 DIAGNOSIS — I10 Essential (primary) hypertension: Secondary | ICD-10-CM | POA: Insufficient documentation

## 2023-02-01 MED ORDER — NITROGLYCERIN 0.4 MG SL SUBL: 0.4000 mg | SUBLINGUAL_TABLET | SUBLINGUAL | 1 refills | Status: AC | PRN

## 2023-02-01 NOTE — Progress Notes (Signed)
 Cardiology Office Note:  .   Date: 02/01/2023 ID:  James Dyer, DOB May 09, 1945, MRN [id number] PCP: James Rush, MD  [facility] Providers Cardiologist:  James Emmer, MD    History of Present Illness: .   James Dyer is a 78 y.o. male with a PMH of persistent A-fib, hypertension, hyperlipidemia, type 2 diabetes, GERD, dysphagia, hx of prostate cancer, hx of syncope and collapse, hx of ascending aortic aneurysm, and chronic neuromuscular symptoms and bilateral foot drop, who presents today for scheduled follow-up.   Previous CV hx includes no known CAD, was found to have nonischemic Myoview  in 2015.  Found to be in asymptomatic A-fib in 2021.  TTE EF 50 to 55%, no significant valvular disease.  Did undergo cardioversion with Dr. Francyne in 2023, but did have early recurrence of A-fib at OV follow-up post DCCV.   Admitted 04/2022 d/t AMS and fall at home, also had AKI. Found to have mild rhabdomyolysis, nontraumatic, and resolved with IV fluid resuscitation. Also found to have acute UTI/retention. Hospital course complicated by acute metabolic encephalopathy that was felt to be likely due to hypoglycemia. MRI of the brain was negative for any abnormalities.  Last saw pt for hospital follow-up on Jun 12, 2022. Said he fell due to a syncopal episode at home while walking to the bathroom, no warning signs/symptoms prior to event. Noted fatigue and dizziness, denied any orthostatic dizziness. Was working with PT.   07/30/2022 - Today he presents for follow-up.  Doing well.  Has not had any more syncopal episodes.  Son states that his last syncopal episode was due to low blood sugar, blood sugar on arrival to ED was 38.  Patient now has Libre freestyle continuous glucose monitoring device, patient states his readings have been labile. Denies any chest pain, shortness of breath, palpitations, syncope, presyncope, dizziness, orthopnea, PND, swelling or significant weight changes, acute  bleeding, or claudication.  Son states patient occasionally eats foods high in sugar.   02/01/2023 - Doing well. Denies any more syncopal episodes. Denies any chest pain, shortness of breath, palpitations, syncope, presyncope, dizziness, orthopnea, PND, swelling or significant weight changes, acute bleeding, or claudication. Admits to weight gain. Does admit to burning sensation in feet and legs.   Studies Reviewed: SABRA    EKG: EKG Interpretation Date/Time:  Friday February 01 2023 09:35:55 EST Ventricular Rate:  78 PR Interval:    QRS Duration:  84 QT Interval:  394 QTC Calculation: 449 R Axis:   9  Text Interpretation: Atrial fibrillation When compared with ECG of 28-Feb-2021 10:19, Atrial fibrillation has replaced Sinus rhythm Inverted T waves have replaced nonspecific T wave abnormality in Inferior leads Confirmed by James Dyer [phone]) on 02/01/2023 9:43:30 AM    Cardiac monitor 06/2022:  Patch Wear Time:  14 days and 0 hours (2024-05-14T16:42:13-0400 to 2024-05-28T16:42:13-0400)   Atrial Fibrillation occurred continuously (100% burden), ranging from 40-126 bpm (avg of 71 bpm). Isolated VEs were rare (<1.0%), VE Couplets were rare (<1.0%), and no VE Triplets were present. Ventricular Bigeminy and Trigeminy were present.   Echo 05/2022:  1. Left ventricular ejection fraction, by estimation, is 50 to 55%. The  left ventricle has low normal function. The left ventricle has no regional  wall motion abnormalities. Left ventricular diastolic parameters are  indeterminate.   2. Right ventricular systolic function is normal. The right ventricular  size is normal. Tricuspid regurgitation signal is inadequate for assessing  PA pressure.   3. Left atrial size was  moderately dilated.   4. The mitral valve is abnormal. Mild mitral valve regurgitation. No  evidence of mitral stenosis.   5. The aortic valve is tricuspid. There is mild calcification of the  aortic valve. There is mild thickening  of the aortic valve. Aortic valve  regurgitation is mild. No aortic stenosis is present.   6. Aortic dilatation noted. There is moderate dilatation of the ascending  aorta, measuring 44 mm.   7. The inferior vena cava is normal in size with greater than 50%  respiratory variability, suggesting right atrial pressure of 3 mmHg.  Risk Assessment/Calculations:    CHA2DS2-VASc Score = 4   This indicates a 4.8% annual risk of stroke. The patient's score is based upon: CHF History: 0 HTN History: 1 Diabetes History: 1 Stroke History: 0 Vascular Disease History: 0 Age Score: 2 Gender Score: 0      Physical Exam:   VS:  BP 130/74   Pulse 73   Ht 5' 9 (1.753 m)   Wt 263 lb (119.3 kg)   SpO2 96%   BMI 38.84 kg/m    Wt Readings from Last 3 Encounters:  02/01/23 263 lb (119.3 kg)  07/30/22 243 lb 12.8 oz (110.6 kg)  06/12/22 241 lb 3.2 oz (109.4 kg)    GEN: Obese, 78 year old male in no acute distress NECK: No JVD; No carotid bruits CARDIAC: S1/S2, irregular rhythm and regular rate, no murmurs, rubs, gallops RESPIRATORY:  Clear to auscultation without rales, wheezing or rhonchi  EXTREMITIES:  No edema; No deformity   ASSESSMENT AND PLAN: .    Hx of chest pain, screening for CAD T wave inversions noted along inferior leads, denies any chest pain. Has multiple risk factors for CAD. Lexiscan  in 2015 revealed/suggested possible area of scar with mild peri-infarct ischemia in inferior wall, LVEF 55% with apparent inferior basal hypokinesis and normal volumes. Discussed repeating ischemic evaluation based on his risk of future events and discussed Lexiscan  including risks and benefits and he verbalized understanding and is agreeable to proceed. Not on ASA d/t being on Eliquis . Will provide Rx for NTG PRN. No other medication changes at this time. Care and ED precautions discussed.   Informed Consent   Shared Decision Making/Informed Consent The risks [chest pain, shortness of breath,  cardiac arrhythmias, dizziness, blood pressure fluctuations, myocardial infarction, stroke/transient ischemic attack, nausea, vomiting, allergic reaction, radiation exposure, metallic taste sensation and life-threatening complications (estimated to be 1 in 10,000)], benefits (risk stratification, diagnosing coronary artery disease, treatment guidance) and alternatives of a nuclear stress test were discussed in detail with Mr. Weekes and he agrees to proceed.    2. Hx of syncope and collapse Etiology d/t hypoglycemia. Denies any recurrent episodes. Past workup overall unremarkable. Continue to follow with PCP and continue current medication regimen. Care and ED precautions discussed.    Persistent A-fib Denies any tachycardia or palpitations. HR well controlled today. Continue Lopressor  and Eliquis  5 mg BID, on appropriate dosage and denies any bleeding issues. Heart healthy diet encouraged.    HTN BP stable. Discussed to monitor BP at home at least 2 hours after medications and sitting for 5-10 minutes. Continue current medication regimen. Heart healthy diet encouraged.    HLD LDL 143 10/2022. Currently on Zetia . Admits that he has not been able to tolerate past medications for cholesterol. Discussed statins, pt explains hesitancy. Will refer to [facility]. Heart healthy diet encouraged.    5. Ascending aortic aneurysm Noted on CT 04/2022, measuring 4.1 cm. Recommended  for annual imaging. It was recommended he follow-up with PCP for CTA and with vascular surgery for further imaging outpatient. If not arranged by PCP, will arrange repeat imaging for 04/2023.- plan to address at next OV. Care and ED precautions discussed.   6. Feet burning/leg burning/ pain in both lower extremities Etiology multifactorial. Will arrange ABI's for further evaluation. Continue current medication regimen. Heart healthy diet and regular cardiovascular exercise encouraged. Continue to follow with PCP.  Dispo: Follow-up  with Dr. Delford or APP in 2-3 months or sooner if anything changes.  Signed, [name], NP

## 2023-02-01 NOTE — Patient Instructions (Addendum)
 Medication Instructions:  Your physician recommends that you continue on your current medications as directed. Please refer to the Current Medication list given to you today. The proper use and anticipated side effects of nitroglycerine has been carefully explained.  If a single episode of chest pain is not relieved by one tablet, the patient will try another within 5 minutes; and if this doesn't relieve the pain, the patient is instructed to call 911 for transportation to an emergency department.  Labwork: None   Testing/Procedures: Your physician has requested that you have a lexiscan  myoview . For further information please visit [email or web address] Please follow instruction sheet, as given. Your physician has requested that you have an ankle brachial index (ABI). During this test an ultrasound and blood pressure cuff are used to evaluate the arteries that supply the arms and legs with blood. Allow thirty minutes for this exam. There are no restrictions or special instructions.  Please note: We ask at that you not bring children with you during ultrasound (echo/ vascular) testing. Due to room size and safety concerns, children are not allowed in the ultrasound rooms during exams. Our front office staff cannot provide observation of children in our lobby area while testing is being conducted. An adult accompanying a patient to their appointment will only be allowed in the ultrasound room at the discretion of the ultrasound technician under special circumstances. We apologize for any inconvenience.  Follow-Up: Your physician recommends that you schedule a follow-up appointment in: 2-3 months   Any Other Special Instructions Will Be Listed Below (If Applicable).  If you need a refill on your cardiac medications before your next appointment, please call your pharmacy.

## 2023-02-04 ENCOUNTER — Ambulatory Visit: Payer: Medicare Other | Attending: Nurse Practitioner

## 2023-02-04 DIAGNOSIS — M79605 Pain in left leg: Secondary | ICD-10-CM | POA: Diagnosis not present

## 2023-02-04 DIAGNOSIS — M79604 Pain in right leg: Secondary | ICD-10-CM

## 2023-02-05 LAB — VAS US ABI WITH/WO TBI
Left ABI: 0.93
Right ABI: 0.99

## 2023-02-06 DIAGNOSIS — A493 Mycoplasma infection, unspecified site: Secondary | ICD-10-CM | POA: Diagnosis not present

## 2023-02-06 DIAGNOSIS — E6609 Other obesity due to excess calories: Secondary | ICD-10-CM | POA: Diagnosis not present

## 2023-02-06 DIAGNOSIS — R6889 Other general symptoms and signs: Secondary | ICD-10-CM | POA: Diagnosis not present

## 2023-02-06 DIAGNOSIS — Z20828 Contact with and (suspected) exposure to other viral communicable diseases: Secondary | ICD-10-CM | POA: Diagnosis not present

## 2023-02-06 DIAGNOSIS — Z6838 Body mass index (BMI) 38.0-38.9, adult: Secondary | ICD-10-CM | POA: Diagnosis not present

## 2023-02-08 ENCOUNTER — Other Ambulatory Visit (HOSPITAL_COMMUNITY): Payer: Medicare Other

## 2023-02-08 ENCOUNTER — Encounter (HOSPITAL_COMMUNITY): Payer: Medicare Other

## 2023-02-12 ENCOUNTER — Ambulatory Visit (HOSPITAL_COMMUNITY): Payer: Medicare Other

## 2023-02-12 ENCOUNTER — Encounter (HOSPITAL_COMMUNITY): Payer: Medicare Other

## 2023-02-25 NOTE — Progress Notes (Signed)
History of Present Illness:   This man is here today for follow-up of the below listed issues:  1.  History of prostate cancer.  10.25.2012: TRUS/Bx. Prostate volume 38 mL, PSA density 0.10.  1/12 cores revealed GS 3+4 pattern.  He completed IMRT on 5.8.2013.  PSA levels have been undetectable recently, last checked in late 2023.   2.  History of urolithiasis.  Had lithotripsy for a left upper ureteral stone on 03/04/2017.  No urine symptoms of stones.   3.  Overactive bladder symptoms.   He has been on Solifenacin.  He has been counseled on more than 1 occasion about decreasing the amount of carbonated/caffeinated beverages.  At his last visit he was still drinking a significant amount of soft drink's.  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 Renette Butters Rx Welchol- pt. hs stopped taking because he believe it gives him  leg cramps.   Hypertension    Nephrolithiasis    renal calculi- , also has been told that he has a cyst on kidney - R side    Neuromuscular disorder (HCC)    bilateral foot drop- both feet    Paroxysmal atrial fibrillation (HCC)     Past Surgical History:  Procedure Laterality Date   BACK SURGERY     CARDIOVERSION N/A 02/28/2021   Procedure: CARDIOVERSION;  Surgeon: Thurmon Fair, MD;  Location: [facility];  Service: Cardiovascular;  Laterality: N/A;   COLONOSCOPY N/A 07/22/2012   Procedure: COLONOSCOPY;  Surgeon: Dalia Heading, MD;  Location: [facility];  Service: Gastroenterology;  Laterality: N/A;   ESOPHAGEAL DILATION N/A 04/22/2019   Procedure: ESOPHAGEAL DILATION;  Surgeon: Malissa Hippo, MD;  Location: [facility];  Service: Endoscopy;  Laterality: N/A;   ESOPHAGOGASTRODUODENOSCOPY N/A 04/22/2019   Procedure: ESOPHAGOGASTRODUODENOSCOPY (EGD);  Surgeon: Malissa Hippo, MD;  Location: [facility];  Service: Endoscopy;  Laterality: N/A;  145   EXTRACORPOREAL SHOCK WAVE LITHOTRIPSY Left 03/04/2017   Procedure: LEFT EXTRACORPOREAL SHOCK WAVE LITHOTRIPSY (ESWL);  Surgeon: Rene Paci, MD;  Location: [facility];  Service: Urology;  Laterality: Left;   FOOT SURGERY Right 1980's   calcium deposit removed   LUMBAR LAMINECTOMY/DECOMPRESSION MICRODISCECTOMY Right 09/01/2013   Procedure: RIGHT LUMBAR FOUR-FIVE LAMINECTOMY/DECOMPRESSION MICRODISCECTOMY 1 LEVEL;  Surgeon: Coletta Memos, MD;  Location: [facility];  Service: Neurosurgery;  Laterality: Right;  Right L45 microdiskectomy   prostate biopsy  2013   SHOULDER OPEN ROTATOR CUFF REPAIR Left 09/09/2012   Procedure: LEFT ROTATOR CUFF REPAIR SHOULDER OPEN;  Surgeon: Jacki Cones, MD;  Location: [facility];  Service: Orthopedics;  Laterality: Left;    Home Medications:  Allergies as of 02/26/2023       Reactions   Latex Other (See Comments)   "sores in mouth after teeth cleaned"        Medication  List        Accurate as of February 25, 2023 12:05 PM. If you have any questions, ask your nurse or doctor.          amLODipine 5 MG tablet Commonly known as: NORVASC TAKE 1 TABLET(5 MG) BY MOUTH DAILY   apixaban 5 MG Tabs tablet Commonly known as: ELIQUIS Take 1 tablet (5 mg total) by mouth 2 (two) times daily.   donepezil 5 MG tablet Commonly known as: ARICEPT Take 5 mg by mouth daily.   ezetimibe 10 MG tablet Commonly known as: ZETIA Take 1 tablet (10 mg total) by mouth daily.   glimepiride 2 MG tablet Commonly known as: AMARYL Take 2 mg by mouth daily  with breakfast.   losartan 50 MG tablet Commonly known as: COZAAR Take 1 tablet (50 mg total) by mouth daily.   meclizine 25 MG tablet Commonly known as: ANTIVERT Take 25 mg by mouth 3 (three) times daily as needed for dizziness.   metFORMIN 500 MG tablet Commonly known as: GLUCOPHAGE Take 500 mg by mouth 2 (two) times daily.   metoprolol tartrate 25 MG tablet Commonly known as: LOPRESSOR TAKE 1 TABLET(25 MG) BY MOUTH TWICE DAILY   nitroGLYCERIN 0.4 MG SL tablet Commonly known as: NITROSTAT Place 1 tablet (0.4 mg total) under the tongue every 5 (five) minutes as needed for chest pain.   OVER THE COUNTER MEDICATION Take 1 tablet by mouth 2 (two) times daily. Super Beta Prostate Advance   solifenacin 10 MG tablet Commonly known as: VESICARE Take 1 tablet (10 mg total) by mouth daily.   tamsulosin 0.4 MG Caps capsule Commonly known as: FLOMAX Take 0.4 mg by mouth every morning.        Allergies:  Allergies  Allergen Reactions   Latex Other (See Comments)    "sores in mouth after teeth cleaned"    No family history on file.  Social History:  reports that he quit smoking about 56 years ago. His smoking use included cigarettes. He started smoking about 61 years ago. He has a 5 pack-year smoking history. He has never used smokeless tobacco. He reports current alcohol use. He reports that he does not use drugs.  ROS: A complete review of systems was performed.  All systems are negative except for pertinent findings as noted.  Physical Exam:  Vital signs in last 24 hours: There were no vitals taken for this visit. Constitutional:  Alert and oriented, No acute distress Cardiovascular: Regular rate  Respiratory: Normal respiratory effort Neurologic: Grossly intact, no focal deficits Psychiatric: Normal mood and affect  I have reviewed prior pt notes  I have reviewed urinalysis results  I have independently reviewed prior imaging-postvoid residual volume  negligible  I have reviewed prior PSA results  IPSS form reviewed-     Impression/Assessment:  Grade group 2 prostate cancer, status post radiotherapy over 10 years ago with excellent PSA result thus far  Overactive bladder, empties quite well but symptoms are still some piquantly bothersome despite trials of multiple OAB medicines  History of urolithiasis, no evidence of recurrent  Plan:   Once again I discussed the fact that sodas can make his bladder symptoms worse  I offered him PTNS-he would like to hold on this  Continue Solifenacin, I will see him back in 3 months

## 2023-02-26 ENCOUNTER — Telehealth: Payer: Self-pay

## 2023-02-26 ENCOUNTER — Ambulatory Visit (INDEPENDENT_AMBULATORY_CARE_PROVIDER_SITE_OTHER): Payer: Medicare Other | Admitting: Urology

## 2023-02-26 VITALS — BP 147/88 | HR 79

## 2023-02-26 DIAGNOSIS — Z8546 Personal history of malignant neoplasm of prostate: Secondary | ICD-10-CM

## 2023-02-26 DIAGNOSIS — R350 Frequency of micturition: Secondary | ICD-10-CM

## 2023-02-26 DIAGNOSIS — N3941 Urge incontinence: Secondary | ICD-10-CM

## 2023-02-26 DIAGNOSIS — R3915 Urgency of urination: Secondary | ICD-10-CM

## 2023-02-26 LAB — URINALYSIS, ROUTINE W REFLEX MICROSCOPIC
Bilirubin, UA: NEGATIVE
Glucose, UA: NEGATIVE
Leukocytes,UA: NEGATIVE
Nitrite, UA: NEGATIVE
Specific Gravity, UA: 1.03 (ref 1.005–1.030)
Urobilinogen, Ur: 0.2 mg/dL (ref 0.2–1.0)
pH, UA: 6 (ref 5.0–7.5)

## 2023-02-26 LAB — MICROSCOPIC EXAMINATION: Bacteria, UA: NONE SEEN

## 2023-02-26 NOTE — Telephone Encounter (Signed)
Call to scheduled start of PTNS treatments

## 2023-03-06 ENCOUNTER — Ambulatory Visit: Payer: Medicare Other

## 2023-03-06 DIAGNOSIS — N3281 Overactive bladder: Secondary | ICD-10-CM | POA: Diagnosis not present

## 2023-03-06 NOTE — Patient Instructions (Signed)

## 2023-03-06 NOTE — Progress Notes (Signed)
 PTNS  Session # 1  Health & Social Factors: No change Caffeine: 1 Alcohol : 0 Daytime voids #per day: 8-10 Night-time voids #per night: 3 Urgency: strong Incontinence Episodes #per day: 6-8 Ankle used: left Treatment Setting: 19 Feeling/ Response: throbbing down to his feet    Performed By: Veleria NOVAK. CMA  Follow Up: As scheduled

## 2023-03-12 ENCOUNTER — Ambulatory Visit (INDEPENDENT_AMBULATORY_CARE_PROVIDER_SITE_OTHER): Payer: Medicare Other

## 2023-03-12 DIAGNOSIS — N3281 Overactive bladder: Secondary | ICD-10-CM

## 2023-03-12 NOTE — Patient Instructions (Signed)

## 2023-03-12 NOTE — Progress Notes (Signed)
PTNS  Session # 2 of 12  PTNS was prescribed for the patient's Overactive Bladder symptoms. The voiding diary and/or patients symptoms were reviewed prior to the start of treatment.   Patient Goals: No accidents   Health & Social Factors:  Caffeine: coffee 0-1 /day Alcohol: yes (occasionally)  Daytime voids #per day: 4-5 Night-time voids #per night: 3 Urgency: yes Incontinence Episodes #per day: 2-3 Treatment Plan/Comments: Reduce fluids  Ankle used: right ( has more sensation in right ankle) Treatment Setting: 6 Feeling/ Response: positive sensation (Sharp pain from needle point to heel) PTNS Treatment The needle electrode was inserted into the lower, inner aspect of patient's leg. The surface electrode was placed on the inside arch of the foot on the treatment leg.The lead set was connected to the stimulator, and the needle electrode clip was connected to the needle electrode. The stimulator that produces an adjustable electrical pulse that travels to the sacral nerve plexus via the tibial nerve was increased until a patient response was observed.    Performed By: Alfonse Spruce CMA  Follow Up: As scheduled

## 2023-03-14 ENCOUNTER — Ambulatory Visit: Payer: Medicare Other | Admitting: Nurse Practitioner

## 2023-03-15 ENCOUNTER — Ambulatory Visit (HOSPITAL_COMMUNITY)
Admission: RE | Admit: 2023-03-15 | Discharge: 2023-03-15 | Disposition: A | Discharge status: Home / Self Care | Payer: Medicare Other | Source: Ambulatory Visit | Attending: Nurse Practitioner | Admitting: Nurse Practitioner

## 2023-03-15 ENCOUNTER — Ambulatory Visit (HOSPITAL_COMMUNITY)
Admission: RE | Admit: 2023-03-15 | Discharge: 2023-03-15 | Disposition: A | Discharge status: Home / Self Care | Payer: Medicare Other | Source: Ambulatory Visit | Attending: Cardiovascular Disease | Admitting: Cardiovascular Disease

## 2023-03-15 DIAGNOSIS — Z136 Encounter for screening for cardiovascular disorders: Secondary | ICD-10-CM

## 2023-03-15 DIAGNOSIS — I4891 Unspecified atrial fibrillation: Secondary | ICD-10-CM

## 2023-03-15 LAB — NM MYOCAR MULTI W/SPECT W/WALL MOTION / EF
Base ST Depression (mm): 0 mm
Estimated workload: 1
Exercise duration (min): 0 min
Exercise duration (sec): 0 s
LV dias vol: 98 mL (ref 62–150)
LV sys vol: 45 mL
MPHR: 143 {beats}/min
Nuc Stress EF: 54 %
Peak HR: 143 {beats}/min
Percent HR: 63 %
RATE: 0.4
Rest HR: 61 {beats}/min
Rest Nuclear Isotope Dose: 10.3 mCi
SDS: 1
SRS: 1
SSS: 2
ST Depression (mm): 0 mm
Stress Nuclear Isotope Dose: 31.4 mCi
TID: 1.01

## 2023-03-15 MED ORDER — REGADENOSON 0.4 MG/5ML IV SOLN
INTRAVENOUS | Status: AC
  Administered 2023-03-15: 0.4 mg via INTRAVENOUS
  Filled 2023-03-15: qty 5

## 2023-03-15 MED ORDER — TECHNETIUM TC 99M TETROFOSMIN IV KIT
31.4000 | PACK | Freq: Once | INTRAVENOUS | Status: AC | PRN
  Administered 2023-03-15: 31.4 via INTRAVENOUS

## 2023-03-15 MED ORDER — SODIUM CHLORIDE FLUSH 0.9 % IV SOLN
INTRAVENOUS | Status: AC
  Administered 2023-03-15: 10 mL via INTRAVENOUS
  Filled 2023-03-15: qty 10

## 2023-03-15 MED ORDER — TECHNETIUM TC 99M TETROFOSMIN IV KIT
10.3000 | PACK | Freq: Once | INTRAVENOUS | Status: AC | PRN
Start: 2023-03-15 — End: 2023-03-15
  Administered 2023-03-15: 10.3 via INTRAVENOUS

## 2023-03-19 ENCOUNTER — Ambulatory Visit (INDEPENDENT_AMBULATORY_CARE_PROVIDER_SITE_OTHER): Payer: Medicare Other

## 2023-03-19 DIAGNOSIS — N3281 Overactive bladder: Secondary | ICD-10-CM

## 2023-03-19 NOTE — Progress Notes (Addendum)
PTNS  Session # 3 of 12  PTNS was prescribed for the patient's Overactive Bladder symptoms. The voiding diary and/or patients symptoms were reviewed prior to the start of treatment.   Patient Goals: No accidents  Health & Social Factors: No Change Caffeine: denies use Alcohol: no Daytime voids #per day: 4 to 5 Night-time voids #per night: 3 to 4 Urgency: yes Strong Incontinence Episodes #per day: All day 4 to 5 Treatment Plan/Comments: Know when I have to void  Ankle used: right Treatment Setting: 9 Feeling/ Response: positive sensation  PTNS Treatment The needle electrode was inserted into the lower, inner aspect of patient's leg. The surface electrode was placed on the inside arch of the foot on the treatment leg.The lead set was connected to the stimulator, and the needle electrode clip was connected to the needle electrode. The stimulator that produces an adjustable electrical pulse that travels to the sacral nerve plexus via the tibial nerve was increased until a patient response was observed.    Performed By: Kennyth Lose, CMA  Follow Up: Keep nurse vistied

## 2023-03-19 NOTE — Patient Instructions (Signed)

## 2023-03-26 ENCOUNTER — Ambulatory Visit (INDEPENDENT_AMBULATORY_CARE_PROVIDER_SITE_OTHER): Payer: Medicare Other

## 2023-03-26 ENCOUNTER — Other Ambulatory Visit: Payer: Self-pay | Admitting: Nurse Practitioner

## 2023-03-26 DIAGNOSIS — N3281 Overactive bladder: Secondary | ICD-10-CM | POA: Diagnosis not present

## 2023-03-26 NOTE — Progress Notes (Signed)
 PTNS   Session # 4   Health & Social Factors: No change Caffeine: 1 Alcohol: 0 Daytime voids #per day: 8-10 Night-time voids #per night: 3 Urgency: strong Incontinence Episodes #per day: 6-8 Ankle used: right Treatment Setting: 3 Feeling/ Response: sensation from heel to toe     Performed By: [facility] LPN   Follow Up: As scheduled

## 2023-03-26 NOTE — Patient Instructions (Signed)

## 2023-03-29 DIAGNOSIS — E11649 Type 2 diabetes mellitus with hypoglycemia without coma: Secondary | ICD-10-CM | POA: Diagnosis not present

## 2023-04-01 ENCOUNTER — Ambulatory Visit (INDEPENDENT_AMBULATORY_CARE_PROVIDER_SITE_OTHER): Payer: Medicare Other

## 2023-04-01 DIAGNOSIS — R399 Unspecified symptoms and signs involving the genitourinary system: Secondary | ICD-10-CM

## 2023-04-01 DIAGNOSIS — N3281 Overactive bladder: Secondary | ICD-10-CM

## 2023-04-01 NOTE — Patient Instructions (Signed)

## 2023-04-01 NOTE — Progress Notes (Signed)
 PTNS  Session # 5 of 12  PTNS was prescribed for the patient's Overactive Bladder symptoms. The voiding diary and/or patients symptoms were reviewed prior to the start of treatment.   Patient Goals: Reduce urgency  Health & Social Factors:  Caffeine:  0 Alcohol: no Daytime voids #per day: 5 Night-time voids #per night: 4 Urgency: no Incontinence Episodes #per day: 3 Treatment Plan/Comments: NA  Ankle used: right Treatment Setting: 5 Feeling/ Response: positive sensation  PTNS Treatment The needle electrode was inserted into the lower, inner aspect of patient's leg. The surface electrode was placed on the inside arch of the foot on the treatment leg.The lead set was connected to the stimulator, and the needle electrode clip was connected to the needle electrode. The stimulator that produces an adjustable electrical pulse that travels to the sacral nerve plexus via the tibial nerve was increased until a patient response was observed.    Performed By: Bridgette Habermann LPN Lorette Ang  Follow Up: Keep nurse visited

## 2023-04-09 ENCOUNTER — Ambulatory Visit (INDEPENDENT_AMBULATORY_CARE_PROVIDER_SITE_OTHER): Payer: Medicare Other

## 2023-04-09 DIAGNOSIS — N3281 Overactive bladder: Secondary | ICD-10-CM | POA: Diagnosis not present

## 2023-04-09 NOTE — Patient Instructions (Signed)

## 2023-04-09 NOTE — Progress Notes (Signed)
 PTNS  Session # 6 of 12  PTNS was prescribed for the patient's Overactive Bladder symptoms. The voiding diary and/or patients symptoms were reviewed prior to the start of treatment.   Patient Goals: reduce urgency, frequency, have no accidents and sleep throughout the night  Health & Social Factors:  Caffeine: denies use Alcohol: no Daytime voids #per day: 5 Night-time voids #per night: 3 Urgency: mild Incontinence Episodes #per day: 3 Treatment Plan/Comments: n.a  Ankle used: right Treatment Setting: 7 Feeling/ Response: positive sensation  PTNS Treatment The needle electrode was inserted into the lower, inner aspect of patient's leg. The surface electrode was placed on the inside arch of the foot on the treatment leg.The lead set was connected to the stimulator, and the needle electrode clip was connected to the needle electrode. The stimulator that produces an adjustable electrical pulse that travels to the sacral nerve plexus via the tibial nerve was increased until a patient response was observed.    Performed By: [facility] LPN  Follow Up: keep scheduled NV

## 2023-04-16 ENCOUNTER — Ambulatory Visit (INDEPENDENT_AMBULATORY_CARE_PROVIDER_SITE_OTHER): Payer: Medicare Other

## 2023-04-16 DIAGNOSIS — N3281 Overactive bladder: Secondary | ICD-10-CM | POA: Diagnosis not present

## 2023-04-16 NOTE — Progress Notes (Signed)
 PTNS   Session # 7 of 12   PTNS was prescribed for the patient's Overactive Bladder symptoms. The voiding diary and/or patients symptoms were reviewed prior to the start of treatment.    Patient Goals: reduce urgency, frequency, have no accidents and sleep throughout the night   Health & Social Factors:  Caffeine: denies use Alcohol: no Daytime voids #per day: 5 Night-time voids #per night: 3 Urgency: mild Incontinence Episodes #per day: 3 Treatment Plan/Comments: n.a   Ankle used: right Treatment Setting:  Feeling/ Response: positive sensation   PTNS Treatment The needle electrode was inserted into the lower, inner aspect of patient's leg. The surface electrode was placed on the inside arch of the foot on the treatment leg.The lead set was connected to the stimulator, and the needle electrode clip was connected to the needle electrode. The stimulator that produces an adjustable electrical pulse that travels to the sacral nerve plexus via the tibial nerve was increased until a patient response was observed.      Performed By: [facility] LPN   Follow Up: keep scheduled NV

## 2023-04-16 NOTE — Patient Instructions (Signed)

## 2023-04-22 ENCOUNTER — Ambulatory Visit (INDEPENDENT_AMBULATORY_CARE_PROVIDER_SITE_OTHER)

## 2023-04-22 DIAGNOSIS — N3281 Overactive bladder: Secondary | ICD-10-CM

## 2023-04-22 NOTE — Patient Instructions (Signed)

## 2023-04-22 NOTE — Progress Notes (Signed)
 PTNS  Session # 8 of 12  PTNS was prescribed for the patient's Overactive Bladder symptoms. The voiding diary and/or patients symptoms were reviewed prior to the start of treatment.   Patient Goals: Reduced urgency, reduced frequency, No accidents   Health & Social Factors: No change Caffeine: denies use Alcohol: yes: 1 beer a week Daytime voids #per day: 4 Night-time voids #per night: 3 Urgency: Mild Incontinence Episodes #per day: 2 or more Treatment Plan/Comments: Reduce caffeine, Toilet every hour, No fluids before bed   Ankle used: right Treatment Setting: 10 Feeling/ Response: positive sensation ankle tingling   PTNS Treatment The needle electrode was inserted into the lower, inner aspect of patient's leg. The surface electrode was placed on the inside arch of the foot on the treatment leg.The lead set was connected to the stimulator, and the needle electrode clip was connected to the needle electrode. The stimulator that produces an adjustable electrical pulse that travels to the sacral nerve plexus via the tibial nerve was increased until a patient response was observed.    Performed By: Alfonse Spruce CMA  Follow Up: As scheduled

## 2023-04-23 ENCOUNTER — Ambulatory Visit: Payer: Medicare Other

## 2023-04-23 ENCOUNTER — Other Ambulatory Visit: Payer: Self-pay

## 2023-04-23 ENCOUNTER — Emergency Department (HOSPITAL_COMMUNITY)

## 2023-04-23 ENCOUNTER — Emergency Department (HOSPITAL_COMMUNITY)
Admission: EM | Admit: 2023-04-23 | Discharge: 2023-04-23 | Disposition: A | Discharge status: Home / Self Care | Attending: Emergency Medicine | Admitting: Emergency Medicine

## 2023-04-23 ENCOUNTER — Encounter (HOSPITAL_COMMUNITY): Payer: Self-pay | Admitting: Emergency Medicine

## 2023-04-23 DIAGNOSIS — W01198A Fall on same level from slipping, tripping and stumbling with subsequent striking against other object, initial encounter: Secondary | ICD-10-CM | POA: Insufficient documentation

## 2023-04-23 DIAGNOSIS — Z91040 Latex allergy status: Secondary | ICD-10-CM | POA: Diagnosis not present

## 2023-04-23 DIAGNOSIS — I10 Essential (primary) hypertension: Secondary | ICD-10-CM | POA: Diagnosis not present

## 2023-04-23 DIAGNOSIS — S0990XA Unspecified injury of head, initial encounter: Secondary | ICD-10-CM | POA: Insufficient documentation

## 2023-04-23 DIAGNOSIS — Z79899 Other long term (current) drug therapy: Secondary | ICD-10-CM | POA: Diagnosis not present

## 2023-04-23 DIAGNOSIS — S199XXA Unspecified injury of neck, initial encounter: Secondary | ICD-10-CM | POA: Diagnosis not present

## 2023-04-23 DIAGNOSIS — Z7901 Long term (current) use of anticoagulants: Secondary | ICD-10-CM | POA: Diagnosis not present

## 2023-04-23 DIAGNOSIS — I6782 Cerebral ischemia: Secondary | ICD-10-CM | POA: Diagnosis not present

## 2023-04-23 DIAGNOSIS — Z23 Encounter for immunization: Secondary | ICD-10-CM | POA: Insufficient documentation

## 2023-04-23 MED ORDER — TETANUS-DIPHTH-ACELL PERTUSSIS 5-2.5-18.5 LF-MCG/0.5 IM SUSY
0.5000 mL | PREFILLED_SYRINGE | Freq: Once | INTRAMUSCULAR | Status: AC
  Administered 2023-04-23: 0.5 mL via INTRAMUSCULAR
  Filled 2023-04-23: qty 0.5

## 2023-04-23 NOTE — ED Provider Notes (Signed)
 [place] EMERGENCY DEPARTMENT AT [facility] Provider Note   CSN: [id number] Arrival date & time: 04/23/23  1744     History {Add pertinent medical, surgical, social history, OB history to HPI:1} Chief Complaint  Patient presents with   James Dyer is a 78 y.o. male.  Patient slipped and fell and hit his head.  No loss of consciousness.  He takes Eliquis.  And has a history of hypertension   Fall       Home Medications Prior to Admission medications   Medication Sig Start Date End Date Taking? Authorizing Provider  losartan (COZAAR) 100 MG tablet Take 100 mg by mouth daily. 03/03/23  Yes [provider]  amLODipine (NORVASC) 5 MG tablet TAKE 1 TABLET(5 MG) BY MOUTH DAILY 09/19/22   Wendall Stade, MD  apixaban (ELIQUIS) 5 MG TABS tablet Take 1 tablet (5 mg total) by mouth 2 (two) times daily. 09/11/22   Sharlene Dory, NP  donepezil (ARICEPT) 5 MG tablet Take 5 mg by mouth daily. 06/06/22   [provider]  ezetimibe (ZETIA) 10 MG tablet Take 1 tablet (10 mg total) by mouth daily. 05/29/22   Sharee Holster, NP  glimepiride (AMARYL) 2 MG tablet Take 2 mg by mouth daily with breakfast.    [provider]  meclizine (ANTIVERT) 25 MG tablet Take 25 mg by mouth 3 (three) times daily as needed for dizziness.    [provider]  metFORMIN (GLUCOPHAGE) 500 MG tablet Take 500 mg by mouth 2 (two) times daily. 11/23/22   [provider]  metoprolol tartrate (LOPRESSOR) 25 MG tablet TAKE 1 TABLET(25 MG) BY MOUTH TWICE DAILY 03/26/23   Sharlene Dory, NP  nitroGLYCERIN (NITROSTAT) 0.4 MG SL tablet Place 1 tablet (0.4 mg total) under the tongue every 5 (five) minutes as needed for chest pain. 02/01/23 05/02/23  Sharlene Dory, NP  OVER THE COUNTER MEDICATION Take 1 tablet by mouth 2 (two) times daily. Super Beta Prostate Advance    [provider]  solifenacin (VESICARE) 10 MG tablet Take 1 tablet (10 mg total) by mouth  daily. 10/29/22   Marcine Matar, MD  tamsulosin (FLOMAX) 0.4 MG CAPS capsule Take 0.4 mg by mouth every morning. 11/23/22   [provider]      Allergies    Latex    Review of Systems   Review of Systems  Physical Exam Updated Vital Signs BP (!) 152/138 (BP Location: Right Arm)   Pulse 79   Temp 98.8 F (37.1 C) (Oral)   Resp (!) 21   Ht 5\' 9"  (1.753 m)   Wt 115.7 kg   SpO2 97%   BMI 37.66 kg/m  Physical Exam  ED Results / Procedures / Treatments   Labs (all labs ordered are listed, but only abnormal results are displayed) Labs Reviewed - No data to display  EKG None  Radiology CT Head Wo Contrast Result Date: 04/23/2023 CLINICAL DATA:  Head trauma, intracranial arterial injury suspected; Neck trauma (Age >= 65y) EXAM: CT HEAD WITHOUT CONTRAST CT CERVICAL SPINE WITHOUT CONTRAST TECHNIQUE: Multidetector CT imaging of the head and cervical spine was performed following the standard protocol without intravenous contrast. Multiplanar CT image reconstructions of the cervical spine were also generated. RADIATION DOSE REDUCTION: This exam was performed according to the departmental dose-optimization program which includes automated exposure control, adjustment of the mA and/or kV according to patient size and/or use of iterative reconstruction technique. COMPARISON:  None  Available. FINDINGS: CT HEAD FINDINGS Brain: No evidence of acute infarction, hemorrhage, hydrocephalus, extra-axial collection or mass lesion/mass effect. Chronic microvascular ischemic change. Vascular: No hyperdense vessel or unexpected calcification. Skull: Normal. Negative for fracture or focal lesion. Sinuses/Orbits: Clear sinuses.  No acute orbital findings. CT CERVICAL SPINE FINDINGS Alignment: No substantial sagittal subluxation. Skull base and vertebrae: No acute fracture. Soft tissues and spinal canal: No prevertebral fluid or swelling. No visible canal hematoma. 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 Cystic change of the dens likely degenerative in etiology. Upper chest: Visualized lung apices are clear. IMPRESSION: No evidence of acute abnormality intracranially or in the cervical spine. Electronically Signed   By: Feliberto Harts M.D.   On: 04/23/2023 19:46   CT Cervical Spine Wo Contrast Result Date: 04/23/2023 CLINICAL DATA:  Head trauma, intracranial arterial injury suspected; Neck trauma (Age >= 65y) EXAM: CT HEAD WITHOUT CONTRAST CT CERVICAL SPINE WITHOUT CONTRAST TECHNIQUE: Multidetector CT imaging of the head and cervical spine was performed following the standard protocol without intravenous contrast. Multiplanar CT image reconstructions of the cervical spine were also generated. RADIATION DOSE REDUCTION: This exam was performed according to the departmental dose-optimization program which includes automated exposure control, adjustment of the mA and/or kV according to patient size and/or use of iterative reconstruction technique. COMPARISON:  None Available. FINDINGS: CT HEAD FINDINGS Brain: No evidence of acute infarction, hemorrhage, hydrocephalus, extra-axial collection or mass lesion/mass effect. Chronic microvascular ischemic change. Vascular: No hyperdense vessel or unexpected calcification. Skull: Normal. Negative for fracture or focal lesion. Sinuses/Orbits: Clear sinuses.  No acute orbital findings. CT CERVICAL SPINE FINDINGS Alignment: No substantial sagittal subluxation. Skull base and vertebrae: No acute fracture. Soft tissues and spinal canal: No prevertebral fluid or swelling. No visible canal hematoma. Disc levels: Moderate to severe multilevel degenerative change with degenerative disc disease greatest in the lower cervical spine. Cystic change of the dens likely degenerative in etiology. Upper chest: Visualized lung apices are clear. IMPRESSION: No evidence of acute abnormality  intracranially or in the cervical spine. Electronically Signed   By: Feliberto Harts M.D.   On: 04/23/2023 19:46    Procedures Procedures  {Document cardiac monitor, telemetry assessment procedure when appropriate:1}  Medications Ordered in ED Medications  Tdap (BOOSTRIX) injection 0.5 mL (has no administration in time range)    ED Course/ Medical Decision Making/ A&P   {   Click here for ABCD2, HEART and other calculatorsREFRESH Note before signing :1}                              Medical Decision Making Amount and/or Complexity of Data Reviewed Radiology: ordered.  Risk Prescription drug management.   Patient with contusion to head and abrasion to occipital area.  He will take Tylenol and follow-up with his PCP.  CT head negative  {Document critical care time when appropriate:1} {Document review of labs and clinical decision tools ie heart score, Chads2Vasc2 etc:1}  {Document your independent review of radiology images, and any outside records:1} {Document your discussion with family members, caretakers, and with consultants:1} {Document social determinants of health affecting pt's care:1} {Document your decision making why or why not admission, treatments were needed:1} Final Clinical Impression(s) / ED Diagnoses Final diagnoses:  Minor head injury, initial encounter    Rx / DC Orders ED Discharge Orders     None

## 2023-04-23 NOTE — ED Triage Notes (Signed)
 Pt bib family pov after a fall. Pt reports he was at [employer]. When he went to step down his feet got tangled and he fell. Pt hit the right side of his body. Pt has an abrasion on his right elbow and the top, back right of his head. Pt does not believe he loc. Pt endorses headache but denies any N/V. Pt also c/o neck pain. Pt had floaters earlier but have subsided now.   Pt has AFIB and is on blood thinners

## 2023-04-23 NOTE — ED Notes (Signed)
 Pt walked to the restroom in the room with no complaints. And is sitting in the chair in the room at this time. Call light in reach and family in the room.

## 2023-04-23 NOTE — ED Notes (Signed)
 Patient transported to CT

## 2023-04-23 NOTE — Discharge Instructions (Addendum)
Take Tylenol for pain.  Follow-up with your doctor if any problems 

## 2023-04-29 ENCOUNTER — Ambulatory Visit (INDEPENDENT_AMBULATORY_CARE_PROVIDER_SITE_OTHER)

## 2023-04-29 ENCOUNTER — Other Ambulatory Visit (HOSPITAL_COMMUNITY): Payer: Self-pay | Admitting: Family Medicine

## 2023-04-29 DIAGNOSIS — N3281 Overactive bladder: Secondary | ICD-10-CM

## 2023-04-29 DIAGNOSIS — Z6839 Body mass index (BMI) 39.0-39.9, adult: Secondary | ICD-10-CM | POA: Diagnosis not present

## 2023-04-29 DIAGNOSIS — E782 Mixed hyperlipidemia: Secondary | ICD-10-CM | POA: Diagnosis not present

## 2023-04-29 DIAGNOSIS — E1159 Type 2 diabetes mellitus with other circulatory complications: Secondary | ICD-10-CM | POA: Diagnosis not present

## 2023-04-29 DIAGNOSIS — E6609 Other obesity due to excess calories: Secondary | ICD-10-CM | POA: Diagnosis not present

## 2023-04-29 DIAGNOSIS — I714 Abdominal aortic aneurysm, without rupture, unspecified: Secondary | ICD-10-CM

## 2023-04-29 DIAGNOSIS — E538 Deficiency of other specified B group vitamins: Secondary | ICD-10-CM | POA: Diagnosis not present

## 2023-04-29 DIAGNOSIS — I10 Essential (primary) hypertension: Secondary | ICD-10-CM | POA: Diagnosis not present

## 2023-04-29 DIAGNOSIS — I480 Paroxysmal atrial fibrillation: Secondary | ICD-10-CM | POA: Diagnosis not present

## 2023-04-29 DIAGNOSIS — D6869 Other thrombophilia: Secondary | ICD-10-CM | POA: Diagnosis not present

## 2023-04-29 DIAGNOSIS — I4891 Unspecified atrial fibrillation: Secondary | ICD-10-CM | POA: Diagnosis not present

## 2023-04-29 NOTE — Progress Notes (Signed)
 PTNS  Session # 9 of 12  PTNS was prescribed for the patient's Overactive Bladder symptoms. The voiding diary and/or patients symptoms were reviewed prior to the start of treatment.   Patient Goals: Reduced urgency, reduced frequency, No accidents    Health & Social Factors: No change Caffeine: denies use Alcohol: yes: 1 beer a week Daytime voids #per day: 4 Night-time voids #per night: 3 Urgency: Mild Incontinence Episodes #per day: 2 or more Treatment Plan/Comments: Reduce caffeine, Toilet every hour, No fluids before bed   Ankle used: right Treatment Setting: 3 Feeling/ Response: positive sensation  PTNS Treatment The needle electrode was inserted into the lower, inner aspect of patient's leg. The surface electrode was placed on the inside arch of the foot on the treatment leg.The lead set was connected to the stimulator, and the needle electrode clip was connected to the needle electrode. The stimulator that produces an adjustable electrical pulse that travels to the sacral nerve plexus via the tibial nerve was increased until a patient response was observed.    Performed By: [facility] LPN  Follow Up: keep scheduled NV

## 2023-04-29 NOTE — Patient Instructions (Signed)

## 2023-04-30 DIAGNOSIS — I10 Essential (primary) hypertension: Secondary | ICD-10-CM | POA: Diagnosis not present

## 2023-04-30 DIAGNOSIS — I4891 Unspecified atrial fibrillation: Secondary | ICD-10-CM | POA: Diagnosis not present

## 2023-04-30 DIAGNOSIS — E7849 Other hyperlipidemia: Secondary | ICD-10-CM | POA: Diagnosis not present

## 2023-04-30 DIAGNOSIS — E118 Type 2 diabetes mellitus with unspecified complications: Secondary | ICD-10-CM | POA: Diagnosis not present

## 2023-05-09 ENCOUNTER — Ambulatory Visit (HOSPITAL_COMMUNITY)
Admission: RE | Admit: 2023-05-09 | Discharge: 2023-05-09 | Disposition: A | Discharge status: Home / Self Care | Source: Ambulatory Visit | Attending: Family Medicine | Admitting: Family Medicine

## 2023-05-09 DIAGNOSIS — I714 Abdominal aortic aneurysm, without rupture, unspecified: Secondary | ICD-10-CM | POA: Diagnosis not present

## 2023-05-09 LAB — POCT I-STAT CREATININE: Creatinine, Ser: 1.2 mg/dL (ref 0.61–1.24)

## 2023-05-09 MED ORDER — IOHEXOL 350 MG/ML SOLN
125.0000 mL | Freq: Once | INTRAVENOUS | Status: AC | PRN
  Administered 2023-05-09: 125 mL via INTRAVENOUS

## 2023-05-10 ENCOUNTER — Ambulatory Visit

## 2023-05-10 DIAGNOSIS — N3281 Overactive bladder: Secondary | ICD-10-CM | POA: Diagnosis not present

## 2023-05-10 NOTE — Progress Notes (Signed)
 PTNS  Session # 10 of 12  PTNS was prescribed for the patient's Overactive Bladder symptoms. The voiding diary and/or patients symptoms were reviewed prior to the start of treatment.   Patient Goals: Reduce frequency, reduce urgency, no accidents and sleep through the night   Health & Social Factors: No Change  Caffeine: denies use Alcohol: no Daytime voids #per day: 4 + Night-time voids #per night: 3+ Urgency: Strong  Incontinence Episodes #per day: " all day long " per Patient  Treatment Plan/Comments: reduce caffeine  Ankle used: left Treatment Setting: 2 Feeling/ Response: toe tingling sensation  PTNS Treatment The needle electrode was inserted into the lower, inner aspect of patient's leg. The surface electrode was placed on the inside arch of the foot on the treatment leg.The lead set was connected to the stimulator, and the needle electrode clip was connected to the needle electrode. The stimulator that produces an adjustable electrical pulse that travels to the sacral nerve plexus via the tibial nerve was increased until a patient response was observed.    Performed By: Alfonse Spruce CMA   Follow Up: As scheduled

## 2023-05-10 NOTE — Patient Instructions (Signed)

## 2023-05-16 ENCOUNTER — Encounter: Payer: Self-pay | Admitting: Nurse Practitioner

## 2023-05-16 ENCOUNTER — Ambulatory Visit: Payer: Medicare Other | Attending: Nurse Practitioner | Admitting: Nurse Practitioner

## 2023-05-16 VITALS — BP 130/70 | HR 80 | Ht 69.0 in | Wt 256.0 lb

## 2023-05-16 DIAGNOSIS — I4819 Other persistent atrial fibrillation: Secondary | ICD-10-CM

## 2023-05-16 DIAGNOSIS — I739 Peripheral vascular disease, unspecified: Secondary | ICD-10-CM

## 2023-05-16 DIAGNOSIS — R29898 Other symptoms and signs involving the musculoskeletal system: Secondary | ICD-10-CM

## 2023-05-16 DIAGNOSIS — E785 Hyperlipidemia, unspecified: Secondary | ICD-10-CM | POA: Diagnosis not present

## 2023-05-16 DIAGNOSIS — I10 Essential (primary) hypertension: Secondary | ICD-10-CM

## 2023-05-16 DIAGNOSIS — I7121 Aneurysm of the ascending aorta, without rupture: Secondary | ICD-10-CM

## 2023-05-16 DIAGNOSIS — I259 Chronic ischemic heart disease, unspecified: Secondary | ICD-10-CM | POA: Diagnosis not present

## 2023-05-16 DIAGNOSIS — Z87898 Personal history of other specified conditions: Secondary | ICD-10-CM

## 2023-05-16 NOTE — Progress Notes (Signed)
 Cardiology Office Note:  .   Date: 05/16/2023 ID:  James Dyer, DOB November 10, 1945, MRN [id number] PCP: Minus Amel, MD  [facility] Providers Cardiologist:  Janelle Mediate, MD    History of Present Illness: .   James Dyer is a 78 y.o. male with a PMH of persistent A-fib, hypertension, hyperlipidemia, type 2 diabetes, GERD, dysphagia, hx of prostate cancer, hx of syncope and collapse, hx of ascending aortic aneurysm, and chronic neuromuscular symptoms and bilateral foot drop, who presents today for scheduled follow-up.   Previous CV hx includes no known CAD, was found to have nonischemic Myoview  in 2015.  Found to be in asymptomatic A-fib in 2021.  TTE EF 50 to 55%, no significant valvular disease.  Did undergo cardioversion with Dr. Alvis Ba in 2023, but did have early recurrence of A-fib at OV follow-up post DCCV.   Admitted 04/2022 d/t AMS and fall at home, also had AKI. Found to have mild rhabdomyolysis, nontraumatic, and resolved with IV fluid resuscitation. Also found to have acute UTI/retention. Hospital course complicated by acute metabolic encephalopathy that was felt to be likely due to hypoglycemia. MRI of the brain was negative for any abnormalities.  Last saw pt for hospital follow-up on Jun 12, 2022. Said he fell due to a syncopal episode at home while walking to the bathroom, no warning signs/symptoms prior to event. Noted fatigue and dizziness, denied any orthostatic dizziness. Was working with PT.   07/30/2022 - Today he presents for follow-up.  Doing well.  Has not had any more syncopal episodes.  Son states that his last syncopal episode was due to low blood sugar, blood sugar on arrival to ED was 38.  Patient now has Libre freestyle continuous glucose monitoring device, patient states his readings have been labile. Denies any chest pain, shortness of breath, palpitations, syncope, presyncope, dizziness, orthopnea, PND, swelling or significant weight changes, acute  bleeding, or claudication.  Son states patient occasionally eats foods high in sugar.   02/01/2023 - Doing well. Denies any more syncopal episodes. Denies any chest pain, shortness of breath, palpitations, syncope, presyncope, dizziness, orthopnea, PND, swelling or significant weight changes, acute bleeding, or claudication. Admits to weight gain. Does admit to burning sensation in feet and legs.   05/16/2023 -today presents for follow-up with his wife.  Says now he is taking thyroid  medications.  Seems to be doing a little bit better after beginning medicine.  Says a medical staff member at PCPs office was speaking to him about asking about injectable medication to help control his cholesterol.  Says he fell about 2 weeks ago since I last saw him.  Says he slipped and fell and hit his head, attributes this to leg braces and hx of dropped foot.  No LOC.  CT imaging in ED on March 25 was negative for anything acute. Denies any chest pain, shortness of breath, palpitations, syncope, presyncope, dizziness, orthopnea, PND, swelling or significant weight changes, acute bleeding, or claudication. Continues to admit to some weakness along BLE, seems to be some improved since starting thyroid  medication.   Studies Reviewed: Aaron Aas    EKG: EKG is not ordered today.   Lexiscan  03/2023:  Perfusion/Defect LV perfusion is abnormal. There is evidence of ischemia. There is evidence of infarction. Defect 1: There is a small defect with mild reduction in uptake present in the mid to basal inferior location(s) that is partially reversible. There is normal wall motion in the defect area. Consistent with infarction and ischemia. The  defect is consistent with abnormal perfusion in the RCA territory.  Chamber Size Left ventricular function is abnormal. Global function is mildly reduced. Nuclear stress EF: 54%. The left ventricular ejection fraction is mildly decreased (45-54%). End diastolic cavity size is mildly enlarged. End  systolic cavity size is normal. No evidence of transient ischemic dilation (TID) noted.  Stress Combined Conclusion Findings are consistent with infarction with peri-infarct ischemia. The study is low risk.   ABI's 01/2023: Summary:  Right: Resting right ankle-brachial index is within normal range. The  right toe-brachial index is abnormal.   Left: Resting left ankle-brachial index indicates mild left lower  extremity arterial disease. The left toe-brachial index is normal.  Cardiac monitor 06/2022:  Patch Wear Time:  14 days and 0 hours (2024-05-14T16:42:13-0400 to 2024-05-28T16:42:13-0400)   Atrial Fibrillation occurred continuously (100% burden), ranging from 40-126 bpm (avg of 71 bpm). Isolated VEs were rare (<1.0%), VE Couplets were rare (<1.0%), and no VE Triplets were present. Ventricular Bigeminy and Trigeminy were present.   Echo 05/2022:  1. Left ventricular ejection fraction, by estimation, is 50 to 55%. The  left ventricle has low normal function. The left ventricle has no regional  wall motion abnormalities. Left ventricular diastolic parameters are  indeterminate.   2. Right ventricular systolic function is normal. The right ventricular  size is normal. Tricuspid regurgitation signal is inadequate for assessing  PA pressure.   3. Left atrial size was moderately dilated.   4. The mitral valve is abnormal. Mild mitral valve regurgitation. No  evidence of mitral stenosis.   5. The aortic valve is tricuspid. There is mild calcification of the  aortic valve. There is mild thickening of the aortic valve. Aortic valve  regurgitation is mild. No aortic stenosis is present.   6. Aortic dilatation noted. There is moderate dilatation of the ascending  aorta, measuring 44 mm.   7. The inferior vena cava is normal in size with greater than 50%  respiratory variability, suggesting right atrial pressure of 3 mmHg.  Risk Assessment/Calculations:    CHA2DS2-VASc Score = 4   This  indicates a 4.8% annual risk of stroke. The patient's score is based upon: CHF History: 0 HTN History: 1 Diabetes History: 1 Stroke History: 0 Vascular Disease History: 0 Age Score: 2 Gender Score: 0      Physical Exam:   VS:  BP 130/70   Pulse 80   Ht 5\' 9"  (1.753 m)   Wt 256 lb (116.1 kg)   SpO2 98%   BMI 37.80 kg/m    Wt Readings from Last 3 Encounters:  05/16/23 256 lb (116.1 kg)  04/23/23 255 lb (115.7 kg)  02/01/23 263 lb (119.3 kg)    GEN: Obese, 78 year old male in no acute distress NECK: No JVD; No carotid bruits CARDIAC: S1/S2, irregular rhythm and regular rate, no murmurs, rubs, gallops RESPIRATORY:  Clear to auscultation without rales, wheezing or rhonchi  EXTREMITIES:  No edema; No deformity   ASSESSMENT AND PLAN: .    Ischemic heart disease Recent NST 03/2023 revealed findings consistent with past infarction, with peri-infarct ischemia. Study found to be low risk. Denies any chest pain. Will continue to monitor.  Not on ASA d/t being on Eliquis . Continue current medication regimen. Care and ED precautions discussed.   2. Hx of syncope  Etiology d/t hypoglycemia. Denies any recurrent episodes. Past workup overall unremarkable. Continue to follow with PCP and continue current medication regimen. Care and ED precautions discussed.    Persistent A-fib  Denies any tachycardia or palpitations. HR well controlled today. Continue Lopressor  and Eliquis  5 mg BID, on appropriate dosage and denies any bleeding issues. Heart healthy diet encouraged.    HTN BP stable. Discussed to monitor BP at home at least 2 hours after medications and sitting for 5-10 minutes. Continue current medication regimen. Heart healthy diet encouraged.    HLD LDL 143 10/2022. Currently on Zetia . Admits that he has not been able to tolerate past medications for cholesterol. Discussed statins, pt explains hesitancy. Unable to be evaluated by [facility]. Will discuss Repatha with clinical pharm D.  Pt is agreeable to this and will check with his insurance. Heart healthy diet encouraged.    5. Ascending aortic aneurysm Noted on CT 04/2022, measuring 4.1 cm. Recommended for annual imaging. It was recommended he follow-up with PCP for CTA and with vascular surgery for further imaging outpatient. Most recent CT imaging done recently did not evaluate chest. Discussed options with pt. Will arrange Echo for further evaluation. Care and ED precautions discussed.   6. PAD, leg weakness Dyer past ABI's noted above. Does notice some leg weakness, overall improved, but still present. Will arrange arterial duplex of lower extremity for further evaluation. Continue current medication regimen. Heart healthy diet and regular cardiovascular exercise encouraged. Continue to follow with PCP.  Dispo: Follow-up with Dr. Stann Earnest or APP in 2-3 months or sooner if anything changes.  Signed, Lasalle Pointer, NP

## 2023-05-16 NOTE — Patient Instructions (Signed)
 Medication Instructions:  Your physician recommends that you continue on your current medications as directed. Please refer to the Current Medication list given to you today.  Labwork: None   Testing/Procedures: Your physician has requested that you have an echocardiogram. Echocardiography is a painless test that uses sound waves to create images of your heart. It provides your doctor with information about the size and shape of your heart and how well your heart's chambers and valves are working. This procedure takes approximately one hour. There are no restrictions for this procedure. Please do NOT wear cologne, perfume, aftershave, or lotions (deodorant is allowed). Please arrive 15 minutes prior to your appointment time.  Please note: We ask at that you not bring children with you during ultrasound (echo/ vascular) testing. Due to room size and safety concerns, children are not allowed in the ultrasound rooms during exams. Our front office staff cannot provide observation of children in our lobby area while testing is being conducted. An adult accompanying a patient to their appointment will only be allowed in the ultrasound room at the discretion of the ultrasound technician under special circumstances. We apologize for any inconvenience. Your physician has requested that you have a lower or upper extremity arterial duplex. This test is an ultrasound of the arteries in the legs or arms. It looks at arterial blood flow in the legs and arms. Allow one hour for Lower and Upper Arterial scans. There are no restrictions or special instructions.  Please note: We ask at that you not bring children with you during ultrasound (echo/ vascular) testing. Due to room size and safety concerns, children are not allowed in the ultrasound rooms during exams. Our front office staff cannot provide observation of children in our lobby area while testing is being conducted. An adult accompanying a patient to their  appointment will only be allowed in the ultrasound room at the discretion of the ultrasound technician under special circumstances. We apologize for any inconvenience.  Follow-Up: Your physician recommends that you schedule a follow-up appointment in: 2-3 months   Any Other Special Instructions Will Be Listed Below (If Applicable).  If you need a refill on your cardiac medications before your next appointment, please call your pharmacy.

## 2023-05-17 ENCOUNTER — Ambulatory Visit

## 2023-05-17 DIAGNOSIS — N3281 Overactive bladder: Secondary | ICD-10-CM | POA: Diagnosis not present

## 2023-05-17 NOTE — Patient Instructions (Signed)

## 2023-05-17 NOTE — Progress Notes (Signed)
 PTNS  Session # 11 of 12 weekly treatments  PTNS was prescribed for the patient's Overactive Bladder symptoms. The voiding diary and/or patients symptoms were reviewed prior to the start of treatment.   Patient Goals: decrease urination frequency  Health & Social Factors:  Caffeine: denies use Alcohol: no Daytime voids #per day: 4 Night-time voids #per night: 3-4 Urgency: yes, mild Incontinence Episodes #per day: 2-4 Treatment Plan/Comments: continue weekly treatments  Ankle used: right Treatment Setting: 7 Feeling/ Response: bottom of the foot tingle sensation  PTNS Treatment The needle electrode was inserted into the lower, inner aspect of patient's leg. The surface electrode was placed on the inside arch of the foot on the treatment leg.The lead set was connected to the stimulator, and the needle electrode clip was connected to the needle electrode. The stimulator that produces an adjustable electrical pulse that travels to the sacral nerve plexus via the tibial nerve was increased until a patient response was observed.    Performed By: Gillian Lacrosse, CMA  Follow Up: follow up next week for last weekly cycle then see MD in 4-6 weeks with monthly treatments

## 2023-05-20 ENCOUNTER — Telehealth: Payer: Self-pay | Admitting: Nurse Practitioner

## 2023-05-20 NOTE — Telephone Encounter (Signed)
 Left message for patient to call back

## 2023-05-20 NOTE — Telephone Encounter (Signed)
-----   Message from Lasalle Pointer sent at 05/20/2023  8:09 AM EDT ----- Please let patient know the following:   I have consulted clinical pharmacy team about medication for cholesterol.   2 options:  1. PCSK9i (aka Repatha) - this is self-injectable and is done once a week every other week. Pharmacist said he has a straight medicare supplement. Cost of Repatha would depend on his PartD plan, which she is unable to see. (Usually $~50/month, with up front deductible of up to $550. Could get a [employer] Norberta Beans if that is cost prohibitive).    2. Leqvio - this is administered by healthcare professional in [facility] and only received once every 6 months once on maintenance therapy. Pharmacist said he would be able to let Leqvio at no charge, based on his plan. It is a very well tolerated medication.    After reviewing these options, I feel Leqvio would be the better option. Please see what patient would like to do.   Thanks!   Best, Lasalle Pointer, NP ----- Message ----- From: Lemmie Pyo, RPH-CPP Sent: 05/20/2023   7:51 AM EDT To: Lasalle Pointer, NP  Looks like he has a straight medicare supplement.  Cost of Repatha would depend on his PartD plan, which I can't see. (Usually $~50/month, with up front deductible of up to $550.  Could get a [employer] Norberta Beans if that is cost prohibitive).   He would be able to let Leqvio at no charge, based on his plan.    Kristin ----- Message ----- From: Lasalle Pointer, NP Sent: 05/19/2023   8:57 PM EDT To: [facility]  Want to get patient started on Repatha.  Want to check with his insurance first.  Thanks!   Best, Lasalle Pointer, NP

## 2023-05-20 NOTE — Telephone Encounter (Signed)
 Patient did not answer.

## 2023-05-21 NOTE — Telephone Encounter (Signed)
 Patient informed and verbalized understanding of plan. Patient request info be sent to mychart for him and his wife to be able to review before making a decision MyChart message sent to patient.

## 2023-05-24 ENCOUNTER — Ambulatory Visit

## 2023-05-24 DIAGNOSIS — N3281 Overactive bladder: Secondary | ICD-10-CM | POA: Diagnosis not present

## 2023-05-24 NOTE — Progress Notes (Signed)
 PTNS  Session # 12 of 12  PTNS was prescribed for the patient's Overactive Bladder symptoms. The voiding diary and/or patients symptoms were reviewed prior to the start of treatment.   Patient Goals: No accidents  Health & Social Factors:  Caffeine: denies use Alcohol: no Daytime voids #per day: 5 Night-time voids #per night: 3 Urgency: yes mild Incontinence Episodes #per day: 2-4 Treatment Plan/Comments: Appointment with MD  Ankle used: right Treatment Setting: 9 Feeling/ Response: positive sensation  PTNS Treatment The needle electrode was inserted into the lower, inner aspect of patient's leg. The surface electrode was placed on the inside arch of the foot on the treatment leg.The lead set was connected to the stimulator, and the needle electrode clip was connected to the needle electrode. The stimulator that produces an adjustable electrical pulse that travels to the sacral nerve plexus via the tibial nerve was increased until a patient response was observed.    Performed By: Gorden Latino, CMA  Follow Up: MD

## 2023-05-24 NOTE — Patient Instructions (Signed)

## 2023-06-05 ENCOUNTER — Ambulatory Visit: Attending: Nurse Practitioner

## 2023-06-05 ENCOUNTER — Ambulatory Visit

## 2023-06-05 DIAGNOSIS — I7121 Aneurysm of the ascending aorta, without rupture: Secondary | ICD-10-CM | POA: Insufficient documentation

## 2023-06-05 DIAGNOSIS — I739 Peripheral vascular disease, unspecified: Secondary | ICD-10-CM | POA: Insufficient documentation

## 2023-06-05 DIAGNOSIS — R29898 Other symptoms and signs involving the musculoskeletal system: Secondary | ICD-10-CM

## 2023-06-06 LAB — ECHOCARDIOGRAM COMPLETE
AR max vel: 2.12 cm2
AV Area VTI: 2.17 cm2
AV Mean grad: 5 mmHg
AV Peak grad: 8.4 mmHg
Ao pk vel: 1.45 m/s
MV M vel: 5.82 m/s
MV Peak grad: 135.5 mmHg
MV VTI: 2.27 cm2
S' Lateral: 3.8 cm
Single Plane A2C EF: 45.2 %
Single Plane A4C EF: 41.9 %

## 2023-06-11 ENCOUNTER — Ambulatory Visit: Payer: Self-pay

## 2023-06-17 DIAGNOSIS — Z6838 Body mass index (BMI) 38.0-38.9, adult: Secondary | ICD-10-CM | POA: Diagnosis not present

## 2023-06-17 DIAGNOSIS — E039 Hypothyroidism, unspecified: Secondary | ICD-10-CM | POA: Diagnosis not present

## 2023-06-17 DIAGNOSIS — E6609 Other obesity due to excess calories: Secondary | ICD-10-CM | POA: Diagnosis not present

## 2023-06-21 NOTE — Progress Notes (Signed)
 History of Present Illness:   This man is here today for follow-up of the below listed issues:  1.  History of prostate cancer.  10.25.2012: TRUS/Bx. Prostate volume 38 mL, PSA density 0.10.  1/12 cores revealed GS 3+4 pattern.  He completed IMRT on 5.8.2013.  PSA levels have been undetectable recently, last checked in late 2023.   2.  History of urolithiasis.  Had lithotripsy for a left upper ureteral stone on 03/04/2017.  No urine symptoms of stones.   3.  Overactive bladder symptoms.   He has been on Solifenacin .  He has been counseled on more than 1 occasion about decreasing the amount of carbonated/caffeinated beverages.  At his last visit he was still drinking a significant amount of soft drink's.  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 Jenean Minus Rx Welchol- pt. hs stopped taking because he believe it gives him  leg cramps.   Hypertension    Nephrolithiasis    renal calculi- , also has been told that he has a cyst on kidney - R side    Neuromuscular disorder (HCC)    bilateral foot drop- both feet    Paroxysmal atrial fibrillation (HCC)     Past Surgical History:  Procedure Laterality Date   BACK SURGERY     CARDIOVERSION N/A 02/28/2021   Procedure: CARDIOVERSION;  Surgeon: Luana Rumple, MD;  Location: [facility];  Service: Cardiovascular;  Laterality: N/A;   COLONOSCOPY N/A 07/22/2012   Procedure: COLONOSCOPY;  Surgeon: Beau Bound, MD;  Location: [facility];  Service: Gastroenterology;  Laterality: N/A;   ESOPHAGEAL DILATION N/A 04/22/2019   Procedure: ESOPHAGEAL DILATION;  Surgeon: Ruby Corporal, MD;  Location: [facility];  Service: Endoscopy;  Laterality: N/A;   ESOPHAGOGASTRODUODENOSCOPY N/A 04/22/2019   Procedure: ESOPHAGOGASTRODUODENOSCOPY (EGD);  Surgeon: Ruby Corporal, MD;  Location: [facility];  Service: Endoscopy;  Laterality: N/A;  145   EXTRACORPOREAL SHOCK WAVE LITHOTRIPSY Left 03/04/2017   Procedure: LEFT EXTRACORPOREAL SHOCK WAVE LITHOTRIPSY (ESWL);  Surgeon: Adelbert Homans, MD;  Location: [facility];  Service: Urology;  Laterality: Left;   FOOT SURGERY Right 1980's   calcium  deposit removed   LUMBAR LAMINECTOMY/DECOMPRESSION MICRODISCECTOMY Right 09/01/2013   Procedure: RIGHT LUMBAR FOUR-FIVE LAMINECTOMY/DECOMPRESSION MICRODISCECTOMY 1 LEVEL;  Surgeon: Audie Bleacher, MD;  Location: [facility];  Service: Neurosurgery;  Laterality: Right;  Right L45 microdiskectomy   prostate biopsy  2013   SHOULDER OPEN ROTATOR CUFF REPAIR Left 09/09/2012   Procedure: LEFT ROTATOR CUFF REPAIR SHOULDER OPEN;  Surgeon: Florencia Hunter, MD;  Location: [facility];  Service: Orthopedics;  Laterality: Left;    Home Medications:  Allergies as of 06/25/2023       Reactions   Latex Other (See Comments)   "sores in mouth after teeth cleaned"        Medication  List        Accurate as of Jun 21, 2023  4:37 PM. If you have any questions, ask your nurse or doctor.          amLODipine  5 MG tablet Commonly known as: NORVASC  TAKE 1 TABLET(5 MG) BY MOUTH DAILY   apixaban  5 MG Tabs tablet Commonly known as: ELIQUIS  Take 1 tablet (5 mg total) by mouth 2 (two) times daily.   donepezil 5 MG tablet Commonly known as: ARICEPT Take 5 mg by mouth daily.   ezetimibe  10 MG tablet Commonly known as: ZETIA  Take 1 tablet (10 mg total) by mouth daily.   glimepiride 2 MG tablet Commonly known as: AMARYL Take 2 mg by mouth daily  with breakfast.   levothyroxine 50 MCG tablet Commonly known as: SYNTHROID Take 50 mcg by mouth daily.   losartan  100 MG tablet Commonly known as: COZAAR  Take 100 mg by mouth daily.   meclizine 25 MG tablet Commonly known as: ANTIVERT Take 25 mg by mouth 3 (three) times daily as needed for dizziness.   metFORMIN  500 MG tablet Commonly known as: GLUCOPHAGE  Take 500 mg by mouth 2 (two) times daily.   metoprolol  tartrate 25 MG tablet Commonly known as: LOPRESSOR  TAKE 1 TABLET(25 MG) BY MOUTH TWICE DAILY   nitroGLYCERIN  0.4 MG SL tablet Commonly known as: NITROSTAT  Place 1 tablet (0.4 mg total) under the tongue every 5 (five) minutes as needed for chest pain.   OVER THE COUNTER MEDICATION Take 1 tablet by mouth 2 (two) times daily. Super Beta Prostate Advance   solifenacin  10 MG tablet Commonly known as: VESICARE  Take 1 tablet (10 mg total) by mouth daily.   tamsulosin  0.4 MG Caps capsule Commonly known as: FLOMAX  Take 0.4 mg by mouth every morning.        Allergies:  Allergies  Allergen Reactions   Latex Other (See Comments)    "sores in mouth after teeth cleaned"    No family history on file.  Social History:  reports that he quit smoking about 56 years ago. His smoking use included cigarettes. He started smoking about 61 years ago. He has a 5 pack-year smoking history. He has never used smokeless tobacco. He reports current alcohol use. He reports that he does not use drugs.  ROS: A complete review of systems was performed.  All systems are negative except for pertinent findings as noted.  Physical Exam:  Vital signs in last 24 hours: There were no vitals taken for this visit. Constitutional:  Alert and oriented, No acute distress Cardiovascular: Regular rate  Respiratory: Normal respiratory effort Neurologic: Grossly intact, no focal deficits Psychiatric: Normal mood and affect  I have reviewed prior pt notes  I have reviewed urinalysis results  I  have independently reviewed prior imaging-postvoid residual volume negligible  I have reviewed prior PSA results  IPSS form reviewed-     Impression/Assessment:  Grade group 2 prostate cancer, status post radiotherapy over 10 years ago with excellent PSA result thus far  Overactive bladder,   History of urolithiasis, no evidence of recurrent  Plan:

## 2023-06-25 ENCOUNTER — Ambulatory Visit: Payer: Medicare Other | Admitting: Urology

## 2023-06-25 VITALS — BP 151/84 | HR 82

## 2023-06-25 DIAGNOSIS — Z8546 Personal history of malignant neoplasm of prostate: Secondary | ICD-10-CM

## 2023-06-25 DIAGNOSIS — N200 Calculus of kidney: Secondary | ICD-10-CM

## 2023-06-25 DIAGNOSIS — N3281 Overactive bladder: Secondary | ICD-10-CM

## 2023-06-25 DIAGNOSIS — Z87442 Personal history of urinary calculi: Secondary | ICD-10-CM | POA: Diagnosis not present

## 2023-06-25 DIAGNOSIS — C61 Malignant neoplasm of prostate: Secondary | ICD-10-CM | POA: Diagnosis not present

## 2023-06-25 LAB — URINALYSIS, ROUTINE W REFLEX MICROSCOPIC
Bilirubin, UA: NEGATIVE
Glucose, UA: NEGATIVE
Ketones, UA: NEGATIVE
Leukocytes,UA: NEGATIVE
Nitrite, UA: NEGATIVE
Specific Gravity, UA: 1.015 (ref 1.005–1.030)
Urobilinogen, Ur: 0.2 mg/dL (ref 0.2–1.0)
pH, UA: 6.5 (ref 5.0–7.5)

## 2023-06-25 LAB — MICROSCOPIC EXAMINATION: Bacteria, UA: NONE SEEN

## 2023-06-25 MED ORDER — GEMTESA 75 MG PO TABS: 1.0000 | ORAL_TABLET | Freq: Every day | ORAL | 0 refills | Status: DC

## 2023-06-25 NOTE — Progress Notes (Unsigned)
 Bladder Scan completed today.  Patient can void prior to the bladder scan. Bladder scan result: 38  Performed By: Melvenia Stabs. CMA  Additional notes-

## 2023-06-26 LAB — PSA: Prostate Specific Ag, Serum: <0.1 ng/mL (ref 0.0–4.0)

## 2023-07-22 ENCOUNTER — Telehealth: Payer: Self-pay | Admitting: Urology

## 2023-07-22 ENCOUNTER — Other Ambulatory Visit: Payer: Self-pay | Admitting: Urology

## 2023-07-22 DIAGNOSIS — N3281 Overactive bladder: Secondary | ICD-10-CM

## 2023-07-22 DIAGNOSIS — N3941 Urge incontinence: Secondary | ICD-10-CM

## 2023-07-22 MED ORDER — GEMTESA 75 MG PO TABS: 1.0000 | ORAL_TABLET | Freq: Every day | ORAL | 11 refills | Status: DC

## 2023-07-22 NOTE — Telephone Encounter (Signed)
 Pt notified MD sent in rx for gemtesa  pt voiced his understanding

## 2023-07-22 NOTE — Telephone Encounter (Signed)
 Dr D gave him samples of Gemtesa  and it is working. He would like an RX sent to [facility]

## 2023-07-22 NOTE — Telephone Encounter (Signed)
 FYI and advise on Rx please

## 2023-07-23 ENCOUNTER — Ambulatory Visit: Attending: Nurse Practitioner | Admitting: Nurse Practitioner

## 2023-07-23 ENCOUNTER — Encounter: Payer: Self-pay | Admitting: Nurse Practitioner

## 2023-07-23 VITALS — BP 138/74 | HR 79 | Ht 69.0 in | Wt 262.4 lb

## 2023-07-23 DIAGNOSIS — Z87898 Personal history of other specified conditions: Secondary | ICD-10-CM | POA: Diagnosis not present

## 2023-07-23 DIAGNOSIS — R29898 Other symptoms and signs involving the musculoskeletal system: Secondary | ICD-10-CM | POA: Insufficient documentation

## 2023-07-23 DIAGNOSIS — I10 Essential (primary) hypertension: Secondary | ICD-10-CM | POA: Insufficient documentation

## 2023-07-23 DIAGNOSIS — E669 Obesity, unspecified: Secondary | ICD-10-CM | POA: Insufficient documentation

## 2023-07-23 DIAGNOSIS — I259 Chronic ischemic heart disease, unspecified: Secondary | ICD-10-CM | POA: Diagnosis not present

## 2023-07-23 DIAGNOSIS — I4819 Other persistent atrial fibrillation: Secondary | ICD-10-CM | POA: Diagnosis not present

## 2023-07-23 DIAGNOSIS — E785 Hyperlipidemia, unspecified: Secondary | ICD-10-CM | POA: Insufficient documentation

## 2023-07-23 DIAGNOSIS — I7121 Aneurysm of the ascending aorta, without rupture: Secondary | ICD-10-CM | POA: Diagnosis not present

## 2023-07-23 DIAGNOSIS — I739 Peripheral vascular disease, unspecified: Secondary | ICD-10-CM | POA: Diagnosis not present

## 2023-07-23 NOTE — Progress Notes (Signed)
 Cardiology Office Note:  .   Date: 07/23/2023 ID:  James Dyer, DOB 03/10/1945, MRN [id number] PCP: [name], MD  [facility] Providers Cardiologist:  [name], MD    History of Present Illness: .   James Dyer is a 78 y.o. male with a PMH of persistent A-fib, hypertension, hyperlipidemia, type 2 diabetes, GERD, dysphagia, hx of prostate cancer, hx of syncope and collapse, hx of ascending aortic aneurysm, and chronic neuromuscular symptoms and bilateral foot drop, who presents today for scheduled follow-up.    Previous CV hx includes no known CAD, was found to have nonischemic Myoview  in 2015.  Found to be in asymptomatic A-fib in 2021.  TTE EF 50 to 55%, no significant valvular disease.  Did undergo cardioversion with Dr. Francyne in 2023, but did have early recurrence of A-fib at OV follow-up post DCCV.   Admitted 04/2022 d/t AMS and fall at home, also had AKI. Found to have mild rhabdomyolysis, nontraumatic, and resolved with IV fluid resuscitation. Also found to have acute UTI/retention. Hospital course complicated by acute metabolic encephalopathy that was felt to be likely due to hypoglycemia. MRI of the brain was negative for any abnormalities.  Last saw pt for hospital follow-up on Jun 12, 2022. Said he fell due to a syncopal episode at home while walking to the bathroom, no warning signs/symptoms prior to event. Noted fatigue and dizziness, denied any orthostatic dizziness. Was working with PT.   07/30/2022 - Today he presents for follow-up.  Doing well.  Has not had any more syncopal episodes.  Son states that his last syncopal episode was due to low blood sugar, blood sugar on arrival to ED was 38.  Patient now has Libre freestyle continuous glucose monitoring device, patient states his readings have been labile. Denies any chest pain, shortness of breath, palpitations, syncope, presyncope, dizziness, orthopnea, PND, swelling or significant weight changes, acute  bleeding, or claudication.  Son states patient occasionally eats foods high in sugar.   02/01/2023 - Doing well. Denies any more syncopal episodes. Denies any chest pain, shortness of breath, palpitations, syncope, presyncope, dizziness, orthopnea, PND, swelling or significant weight changes, acute bleeding, or claudication. Admits to weight gain. Does admit to burning sensation in feet and legs.   05/16/2023 -today presents for follow-up with his wife.  Says now he is taking thyroid  medications.  Seems to be doing a little bit better after beginning medicine.  Says a medical staff member at PCPs office was speaking to him about asking about injectable medication to help control his cholesterol.  Says he fell about 2 weeks ago since I last saw him.  Says he slipped and fell and hit his head, attributes this to leg braces and hx of dropped foot.  No LOC.  CT imaging in ED on March 25 was negative for anything acute. Denies any chest pain, shortness of breath, palpitations, syncope, presyncope, dizziness, orthopnea, PND, swelling or significant weight changes, acute bleeding, or claudication. Continues to admit to some weakness along BLE, seems to be some improved since starting thyroid  medication.   07/23/2023 -  Here for follow-up. Doing well. Denies any cardiac complaints or issues. Wife would like to know about possible medications for weight loss for patient. Denies any chest pain, shortness of breath, palpitations, syncope, presyncope, dizziness, orthopnea, PND, swelling or significant weight changes, acute bleeding, or claudication.  Studies Reviewed: SABRA    EKG: EKG is not ordered today.   Lower extremity arterial bilateral 05/2023: Summary:  Right: Atherosclerotic plaque noted throughout. Not hemodynamically  significant.   Left: Atherosclerotic plaque noted throughout. Not hemodynamically  significant.     See table(s) above for measurements and observations.   Suggest follow up study in if  clinically indicted. Normal triphasic  waveform noted throughout the bilateral lower extremity. Right ABI 0.99  and left 0.93.   Echo 05/2023: 1. Left ventricular ejection fraction, by estimation, is 50 to 55%. The  left ventricle has low normal function. The left ventricle has no regional  wall motion abnormalities. Left ventricular diastolic parameters are  indeterminate.   2. Right ventricular systolic function is normal. The right ventricular  size is normal. Tricuspid regurgitation signal is inadequate for assessing  PA pressure.   3. Left atrial size was mildly dilated.   4. The mitral valve is abnormal. Mild mitral valve regurgitation. No  evidence of mitral stenosis.   5. The aortic valve is tricuspid. Aortic valve regurgitation is mild. No  aortic stenosis is present.   6. Aortic dilatation noted. There is mild dilatation of the ascending  aorta, measuring 44 mm.   7. The inferior vena cava is normal in size with greater than 50%  respiratory variability, suggesting right atrial pressure of 3 mmHg.   Comparison(s): A prior study was performed on 06/28/2022. EF 50-55%. LA  moderately dilated. Mild MR. Mild AI. Aortic dilatation of the ascending  aorta measuring 44 mm.  Lexiscan  03/2023:  Perfusion/Defect LV perfusion is abnormal. There is evidence of ischemia. There is evidence of infarction. Defect 1: There is a small defect with mild reduction in uptake present in the mid to basal inferior location(s) that is partially reversible. There is normal wall motion in the defect area. Consistent with infarction and ischemia. The defect is consistent with abnormal perfusion in the RCA territory.  Chamber Size Left ventricular function is abnormal. Global function is mildly reduced. Nuclear stress EF: 54%. The left ventricular ejection fraction is mildly decreased (45-54%). End diastolic cavity size is mildly enlarged. End systolic cavity size is normal. No evidence of transient ischemic  dilation (TID) noted.  Stress Combined Conclusion Findings are consistent with infarction with peri-infarct ischemia. The study is low risk.   ABI's 01/2023: Summary:  Right: Resting right ankle-brachial index is within normal range. The  right toe-brachial index is abnormal.   Left: Resting left ankle-brachial index indicates mild left lower  extremity arterial disease. The left toe-brachial index is normal.  Cardiac monitor 06/2022:  Patch Wear Time:  14 days and 0 hours (2024-05-14T16:42:13-0400 to 2024-05-28T16:42:13-0400)   Atrial Fibrillation occurred continuously (100% burden), ranging from 40-126 bpm (avg of 71 bpm). Isolated VEs were rare (<1.0%), VE Couplets were rare (<1.0%), and no VE Triplets were present. Ventricular Bigeminy and Trigeminy were present.   Echo 05/2022:  1. Left ventricular ejection fraction, by estimation, is 50 to 55%. The  left ventricle has low normal function. The left ventricle has no regional  wall motion abnormalities. Left ventricular diastolic parameters are  indeterminate.   2. Right ventricular systolic function is normal. The right ventricular  size is normal. Tricuspid regurgitation signal is inadequate for assessing  PA pressure.   3. Left atrial size was moderately dilated.   4. The mitral valve is abnormal. Mild mitral valve regurgitation. No  evidence of mitral stenosis.   5. The aortic valve is tricuspid. There is mild calcification of the  aortic valve. There is mild thickening of the aortic valve. Aortic valve  regurgitation is mild.  No aortic stenosis is present.   6. Aortic dilatation noted. There is moderate dilatation of the ascending  aorta, measuring 44 mm.   7. The inferior vena cava is normal in size with greater than 50%  respiratory variability, suggesting right atrial pressure of 3 mmHg.      Physical Exam:   VS:  BP 138/74   Pulse 79   Ht 5' 9 (1.753 m)   Wt 262 lb 6.4 oz (119 kg)   SpO2 97%   BMI 38.75 kg/m     Wt Readings from Last 3 Encounters:  07/23/23 262 lb 6.4 oz (119 kg)  05/16/23 256 lb (116.1 kg)  04/23/23 255 lb (115.7 kg)    GEN: Obese, 78 year old male in no acute distress NECK: No JVD; No carotid bruits CARDIAC: S1/S2, irregular rhythm and regular rate, no murmurs, rubs, gallops RESPIRATORY:  Clear to auscultation without rales, wheezing or rhonchi  EXTREMITIES:  No edema; No deformity   ASSESSMENT AND PLAN: .    Ischemic heart disease Recent NST 03/2023 revealed findings consistent with past infarction, with peri-infarct ischemia. Study found to be low risk. Denies any chest pain. Will continue to monitor.  Not on ASA d/t being on Eliquis . Continue current medication regimen. Care and ED precautions discussed.   2. Hx of syncope  Etiology d/t hypoglycemia. Denies any recurrent episodes. Past workup overall unremarkable. Continue to follow with PCP and continue current medication regimen. Care and ED precautions discussed.    Persistent A-fib Denies any tachycardia or palpitations. HR well controlled today. Continue Lopressor  and Eliquis  5 mg BID, on appropriate dosage and denies any bleeding issues. Heart healthy diet encouraged.    HTN BP stable. Discussed to monitor BP at home at least 2 hours after medications and sitting for 5-10 minutes. Continue current medication regimen. Heart healthy diet encouraged.    HLD LDL 143 10/2022. Currently on Zetia . Admits that he has not been able to tolerate past medications for cholesterol. Discussed statins, pt explains hesitancy. Unable to be evaluated by Lipid Clinic. Ran past clinical pharm D - recommended Leqvio. Discussed risks vs benefits and pt and wife are agreeable to initialing Leqvio. Will arrange. Heart healthy diet encouraged.    5. Ascending aortic aneurysm Noted on CT 04/2022, measuring 4.1 cm. Recommended for annual imaging. It was recommended he follow-up with PCP for CTA and with vascular surgery for further imaging  outpatient. Most recent CT imaging done recently did not evaluate chest. Discussed options with pt. Recent Echo revealed mild dilatation of ascending aorta at 44 mm.  Care and ED precautions discussed.   6. PAD, leg weakness See past ABI's noted above. Does notice some leg weakness, overall improved. Most recent arterial duplex of lower extremities unremarkable. Continue current medication regimen. Heart healthy diet and regular cardiovascular exercise encouraged. Continue to follow with PCP.  7. Obesity Weight loss via diet and exercise encouraged. Discussed the impact being overweight would have on cardiovascular risk. Did discuss that would run past clinical pharm D regarding Wegovy/GLP1. Continue to follow with PCP.  Dispo: Follow-up with Dr. Delford or APP in 6 months or sooner if anything changes.  Signed, [name], NP

## 2023-07-23 NOTE — Patient Instructions (Addendum)

## 2023-07-24 ENCOUNTER — Telehealth: Payer: Self-pay

## 2023-07-24 NOTE — Telephone Encounter (Signed)
 Hello,  Pt will be scheduled as soon as possible.  Auth Submission: NO AUTH NEEDED Site of care: Site of care: [facility] Payer: medicare a/b, aarp supp Medication & CPT/J Code(s) submitted: Leqvio (Inclisiran) [id number] Diagnosis Code:  Route of submission (phone, fax, portal): portal Phone # Fax # Auth type: Buy/Bill HB Units/visits requested: 284mg , 3 visits Reference number:  Approval from: 07/24/23 to 01/29/24

## 2023-07-26 ENCOUNTER — Encounter: Attending: Nurse Practitioner | Admitting: Emergency Medicine

## 2023-07-26 VITALS — BP 146/91 | HR 72 | Temp 97.8°F | Resp 20

## 2023-07-26 DIAGNOSIS — E1169 Type 2 diabetes mellitus with other specified complication: Secondary | ICD-10-CM | POA: Diagnosis not present

## 2023-07-26 DIAGNOSIS — E785 Hyperlipidemia, unspecified: Secondary | ICD-10-CM | POA: Diagnosis not present

## 2023-07-26 MED ORDER — INCLISIRAN SODIUM 284 MG/1.5ML ~~LOC~~ SOSY
284.0000 mg | PREFILLED_SYRINGE | Freq: Once | SUBCUTANEOUS | Status: AC
  Administered 2023-07-26: 284 mg via SUBCUTANEOUS

## 2023-07-26 NOTE — Progress Notes (Signed)
 Diagnosis: Hyperlipidemia  Provider:  Miriam,. Elizabeth NP  Procedure: Injection  Leqvio (inclisiran), Dose: 284 mg, Site: subcutaneous, Number of injections: 1  Injection Site(s): Right arm  Post Care: Observation period completed  Discharge: Condition: Good, Destination: Home . AVS Declined  Performed by:  Delon ONEIDA Officer, RN

## 2023-07-27 NOTE — Progress Notes (Incomplete)
 Cardiology Office Note:  .   Date: 07/23/2023 ID:  James Dyer, DOB 1945-07-26, MRN [id number] PCP: [name], MD  [facility] Providers Cardiologist:  [name], MD    History of Present Illness: .   James Dyer is a 78 y.o. male with a PMH of persistent A-fib, hypertension, hyperlipidemia, type 2 diabetes, GERD, dysphagia, hx of prostate cancer, hx of syncope and collapse, hx of ascending aortic aneurysm, and chronic neuromuscular symptoms and bilateral foot drop, who presents today for scheduled follow-up.    Previous CV hx includes no known CAD, was found to have nonischemic Myoview  in 2015.  Found to be in asymptomatic A-fib in 2021.  TTE EF 50 to 55%, no significant valvular disease.  Did undergo cardioversion with Dr. Francyne in 2023, but did have early recurrence of A-fib at OV follow-up post DCCV.   Admitted 04/2022 d/t AMS and fall at home, also had AKI. Found to have mild rhabdomyolysis, nontraumatic, and resolved with IV fluid resuscitation. Also found to have acute UTI/retention. Hospital course complicated by acute metabolic encephalopathy that was felt to be likely due to hypoglycemia. MRI of the brain was negative for any abnormalities.  Last saw pt for hospital follow-up on Jun 12, 2022. Said he fell due to a syncopal episode at home while walking to the bathroom, no warning signs/symptoms prior to event. Noted fatigue and dizziness, denied any orthostatic dizziness. Was working with PT.   07/30/2022 - Today he presents for follow-up.  Doing well.  Has not had any more syncopal episodes.  Son states that his last syncopal episode was due to low blood sugar, blood sugar on arrival to ED was 38.  Patient now has Libre freestyle continuous glucose monitoring device, patient states his readings have been labile. Denies any chest pain, shortness of breath, palpitations, syncope, presyncope, dizziness, orthopnea, PND, swelling or significant weight changes, acute  bleeding, or claudication.  Son states patient occasionally eats foods high in sugar.   02/01/2023 - Doing well. Denies any more syncopal episodes. Denies any chest pain, shortness of breath, palpitations, syncope, presyncope, dizziness, orthopnea, PND, swelling or significant weight changes, acute bleeding, or claudication. Admits to weight gain. Does admit to burning sensation in feet and legs.   05/16/2023 -today presents for follow-up with his wife.  Says now he is taking thyroid  medications.  Seems to be doing a little bit better after beginning medicine.  Says a medical staff member at PCPs office was speaking to him about asking about injectable medication to help control his cholesterol.  Says he fell about 2 weeks ago since I last saw him.  Says he slipped and fell and hit his head, attributes this to leg braces and hx of dropped foot.  No LOC.  CT imaging in ED on March 25 was negative for anything acute. Denies any chest pain, shortness of breath, palpitations, syncope, presyncope, dizziness, orthopnea, PND, swelling or significant weight changes, acute bleeding, or claudication. Continues to admit to some weakness along BLE, seems to be some improved since starting thyroid  medication.   07/23/2023 -  Here for follow-up. Doing well. Denies any cardiac complaints or issues. Wife would like to know about possible medications for weight loss for patient. Denies any chest pain, shortness of breath, palpitations, syncope, presyncope, dizziness, orthopnea, PND, swelling or significant weight changes, acute bleeding, or claudication.  Studies Reviewed: SABRA    EKG: EKG is not ordered today.   Lexiscan  03/2023:  Perfusion/Defect LV perfusion  is abnormal. There is evidence of ischemia. There is evidence of infarction. Defect 1: There is a small defect with mild reduction in uptake present in the mid to basal inferior location(s) that is partially reversible. There is normal wall motion in the defect area.  Consistent with infarction and ischemia. The defect is consistent with abnormal perfusion in the RCA territory.  Chamber Size Left ventricular function is abnormal. Global function is mildly reduced. Nuclear stress EF: 54%. The left ventricular ejection fraction is mildly decreased (45-54%). End diastolic cavity size is mildly enlarged. End systolic cavity size is normal. No evidence of transient ischemic dilation (TID) noted.  Stress Combined Conclusion Findings are consistent with infarction with peri-infarct ischemia. The study is low risk.   ABI's 01/2023: Summary:  Right: Resting right ankle-brachial index is within normal range. The  right toe-brachial index is abnormal.   Left: Resting left ankle-brachial index indicates mild left lower  extremity arterial disease. The left toe-brachial index is normal.  Cardiac monitor 06/2022:  Patch Wear Time:  14 days and 0 hours (2024-05-14T16:42:13-0400 to 2024-05-28T16:42:13-0400)   Atrial Fibrillation occurred continuously (100% burden), ranging from 40-126 bpm (avg of 71 bpm). Isolated VEs were rare (<1.0%), VE Couplets were rare (<1.0%), and no VE Triplets were present. Ventricular Bigeminy and Trigeminy were present.   Echo 05/2022:  1. Left ventricular ejection fraction, by estimation, is 50 to 55%. The  left ventricle has low normal function. The left ventricle has no regional  wall motion abnormalities. Left ventricular diastolic parameters are  indeterminate.   2. Right ventricular systolic function is normal. The right ventricular  size is normal. Tricuspid regurgitation signal is inadequate for assessing  PA pressure.   3. Left atrial size was moderately dilated.   4. The mitral valve is abnormal. Mild mitral valve regurgitation. No  evidence of mitral stenosis.   5. The aortic valve is tricuspid. There is mild calcification of the  aortic valve. There is mild thickening of the aortic valve. Aortic valve  regurgitation is mild. No  aortic stenosis is present.   6. Aortic dilatation noted. There is moderate dilatation of the ascending  aorta, measuring 44 mm.   7. The inferior vena cava is normal in size with greater than 50%  respiratory variability, suggesting right atrial pressure of 3 mmHg.  Risk Assessment/Calculations:    CHA2DS2-VASc Score =     This indicates a  % annual risk of stroke. The patient's score is based upon:        Physical Exam:   VS:  There were no vitals taken for this visit.   Wt Readings from Last 3 Encounters:  05/16/23 256 lb (116.1 kg)  04/23/23 255 lb (115.7 kg)  02/01/23 263 lb (119.3 kg)    GEN: Obese, 78 year old male in no acute distress NECK: No JVD; No carotid bruits CARDIAC: S1/S2, irregular rhythm and regular rate, no murmurs, rubs, gallops RESPIRATORY:  Clear to auscultation without rales, wheezing or rhonchi  EXTREMITIES:  No edema; No deformity   ASSESSMENT AND PLAN: .    Ischemic heart disease Recent NST 03/2023 revealed findings consistent with past infarction, with peri-infarct ischemia. Study found to be low risk. Denies any chest pain. Will continue to monitor.  Not on ASA d/t being on Eliquis . Continue current medication regimen. Care and ED precautions discussed.   2. Hx of syncope  Etiology d/t hypoglycemia. Denies any recurrent episodes. Past workup overall unremarkable. Continue to follow with PCP and continue current medication  regimen. Care and ED precautions discussed.    Persistent A-fib Denies any tachycardia or palpitations. HR well controlled today. Continue Lopressor  and Eliquis  5 mg BID, on appropriate dosage and denies any bleeding issues. Heart healthy diet encouraged.    HTN BP stable. Discussed to monitor BP at home at least 2 hours after medications and sitting for 5-10 minutes. Continue current medication regimen. Heart healthy diet encouraged.    HLD LDL 143 10/2022. Currently on Zetia . Admits that he has not been able to tolerate past  medications for cholesterol. Discussed statins, pt explains hesitancy. Unable to be evaluated by Lipid Clinic. Will discuss Repatha with clinical pharm D. Pt is agreeable to this and will check with his insurance. Heart healthy diet encouraged.    5. Ascending aortic aneurysm Noted on CT 04/2022, measuring 4.1 cm. Recommended for annual imaging. It was recommended he follow-up with PCP for CTA and with vascular surgery for further imaging outpatient. Most recent CT imaging done recently did not evaluate chest. Discussed options with pt. Will arrange Echo for further evaluation. Care and ED precautions discussed.   6. PAD, leg weakness See past ABI's noted above. Does notice some leg weakness, overall improved, but still present. Will arrange arterial duplex of lower extremity for further evaluation. Continue current medication regimen. Heart healthy diet and regular cardiovascular exercise encouraged. Continue to follow with PCP.  Dispo: Follow-up with Dr. Delford or APP in 2-3 months or sooner if anything changes.  Signed, [name], NP

## 2023-08-08 ENCOUNTER — Other Ambulatory Visit: Payer: Self-pay

## 2023-08-08 ENCOUNTER — Observation Stay (HOSPITAL_COMMUNITY)
Admission: EM | Admit: 2023-08-08 | Discharge: 2023-08-09 | Disposition: A | Discharge status: Home / Self Care | Attending: Family Medicine | Admitting: Family Medicine

## 2023-08-08 ENCOUNTER — Emergency Department (HOSPITAL_COMMUNITY)

## 2023-08-08 ENCOUNTER — Encounter (HOSPITAL_COMMUNITY): Payer: Self-pay

## 2023-08-08 DIAGNOSIS — Z87891 Personal history of nicotine dependence: Secondary | ICD-10-CM | POA: Diagnosis not present

## 2023-08-08 DIAGNOSIS — R2681 Unsteadiness on feet: Secondary | ICD-10-CM | POA: Insufficient documentation

## 2023-08-08 DIAGNOSIS — G4733 Obstructive sleep apnea (adult) (pediatric): Secondary | ICD-10-CM | POA: Diagnosis present

## 2023-08-08 DIAGNOSIS — F1090 Alcohol use, unspecified, uncomplicated: Secondary | ICD-10-CM | POA: Diagnosis not present

## 2023-08-08 DIAGNOSIS — R9082 White matter disease, unspecified: Secondary | ICD-10-CM | POA: Diagnosis not present

## 2023-08-08 DIAGNOSIS — I6622 Occlusion and stenosis of left posterior cerebral artery: Secondary | ICD-10-CM | POA: Diagnosis not present

## 2023-08-08 DIAGNOSIS — I152 Hypertension secondary to endocrine disorders: Secondary | ICD-10-CM

## 2023-08-08 DIAGNOSIS — I6522 Occlusion and stenosis of left carotid artery: Secondary | ICD-10-CM | POA: Diagnosis not present

## 2023-08-08 DIAGNOSIS — I480 Paroxysmal atrial fibrillation: Secondary | ICD-10-CM | POA: Diagnosis not present

## 2023-08-08 DIAGNOSIS — Z8673 Personal history of transient ischemic attack (TIA), and cerebral infarction without residual deficits: Secondary | ICD-10-CM | POA: Diagnosis not present

## 2023-08-08 DIAGNOSIS — Z91040 Latex allergy status: Secondary | ICD-10-CM | POA: Insufficient documentation

## 2023-08-08 DIAGNOSIS — R4781 Slurred speech: Secondary | ICD-10-CM | POA: Diagnosis present

## 2023-08-08 DIAGNOSIS — I6782 Cerebral ischemia: Secondary | ICD-10-CM | POA: Diagnosis not present

## 2023-08-08 DIAGNOSIS — I6503 Occlusion and stenosis of bilateral vertebral arteries: Secondary | ICD-10-CM | POA: Diagnosis not present

## 2023-08-08 DIAGNOSIS — I10 Essential (primary) hypertension: Secondary | ICD-10-CM | POA: Diagnosis not present

## 2023-08-08 DIAGNOSIS — E11649 Type 2 diabetes mellitus with hypoglycemia without coma: Secondary | ICD-10-CM | POA: Diagnosis not present

## 2023-08-08 DIAGNOSIS — E1159 Type 2 diabetes mellitus with other circulatory complications: Secondary | ICD-10-CM | POA: Diagnosis present

## 2023-08-08 DIAGNOSIS — G459 Transient cerebral ischemic attack, unspecified: Secondary | ICD-10-CM | POA: Diagnosis not present

## 2023-08-08 DIAGNOSIS — G319 Degenerative disease of nervous system, unspecified: Secondary | ICD-10-CM | POA: Diagnosis not present

## 2023-08-08 LAB — CBC WITH DIFFERENTIAL/PLATELET
Abs Immature Granulocytes: 0.03 K/uL (ref 0.00–0.07)
Basophils Absolute: 0 K/uL (ref 0.0–0.1)
Basophils Relative: 0 %
Eosinophils Absolute: 0.3 K/uL (ref 0.0–0.5)
Eosinophils Relative: 4 %
HCT: 45.1 % (ref 39.0–52.0)
Hemoglobin: 14.9 g/dL (ref 13.0–17.0)
Immature Granulocytes: 0 %
Lymphocytes Relative: 31 %
Lymphs Abs: 2.6 K/uL (ref 0.7–4.0)
MCH: 29.4 pg (ref 26.0–34.0)
MCHC: 33 g/dL (ref 30.0–36.0)
MCV: 89 fL (ref 80.0–100.0)
Monocytes Absolute: 0.6 K/uL (ref 0.1–1.0)
Monocytes Relative: 8 %
Neutro Abs: 4.8 K/uL (ref 1.7–7.7)
Neutrophils Relative %: 57 %
Platelets: 199 K/uL (ref 150–400)
RBC: 5.07 MIL/uL (ref 4.22–5.81)
RDW: 12.4 % (ref 11.5–15.5)
WBC: 8.4 K/uL (ref 4.0–10.5)
nRBC: 0 % (ref 0.0–0.2)

## 2023-08-08 LAB — URINALYSIS, ROUTINE W REFLEX MICROSCOPIC
Bacteria, UA: NONE SEEN
Bilirubin Urine: NEGATIVE
Glucose, UA: NEGATIVE mg/dL
Ketones, ur: NEGATIVE mg/dL
Leukocytes,Ua: NEGATIVE
Nitrite: NEGATIVE
Protein, ur: 30 mg/dL — AB
Specific Gravity, Urine: 1.021 (ref 1.005–1.030)
pH: 7 (ref 5.0–8.0)

## 2023-08-08 LAB — COMPREHENSIVE METABOLIC PANEL WITH GFR
ALT: 31 U/L (ref 0–44)
AST: 30 U/L (ref 15–41)
Albumin: 3.8 g/dL (ref 3.5–5.0)
Alkaline Phosphatase: 48 U/L (ref 38–126)
Anion gap: 13 (ref 5–15)
BUN: 22 mg/dL (ref 8–23)
CO2: 23 mmol/L (ref 22–32)
Calcium: 9 mg/dL (ref 8.9–10.3)
Chloride: 103 mmol/L (ref 98–111)
Creatinine, Ser: 0.97 mg/dL (ref 0.61–1.24)
GFR, Estimated: >60 mL/min
Glucose, Bld: 153 mg/dL — ABNORMAL HIGH (ref 70–99)
Potassium: 3.6 mmol/L (ref 3.5–5.1)
Sodium: 139 mmol/L (ref 135–145)
Total Bilirubin: 0.8 mg/dL (ref 0.0–1.2)
Total Protein: 7.1 g/dL (ref 6.5–8.1)

## 2023-08-08 LAB — CBG MONITORING, ED: Glucose-Capillary: 157 mg/dL — ABNORMAL HIGH (ref 70–99)

## 2023-08-08 LAB — GLUCOSE, CAPILLARY: Glucose-Capillary: 161 mg/dL — ABNORMAL HIGH (ref 70–99)

## 2023-08-08 MED ORDER — INSULIN ASPART 100 UNIT/ML IJ SOLN: 0.0000 [IU] | Freq: Every day | INTRAMUSCULAR | Status: DC

## 2023-08-08 MED ORDER — ACETAMINOPHEN 650 MG RE SUPP: 650.0000 mg | RECTAL | Status: DC | PRN

## 2023-08-08 MED ORDER — METOPROLOL TARTRATE 25 MG PO TABS
25.0000 mg | ORAL_TABLET | Freq: Two times a day (BID) | ORAL | Status: DC
  Administered 2023-08-08 – 2023-08-09 (×2): 25 mg via ORAL
  Filled 2023-08-08 (×2): qty 1

## 2023-08-08 MED ORDER — SENNOSIDES-DOCUSATE SODIUM 8.6-50 MG PO TABS: 1.0000 | ORAL_TABLET | Freq: Every evening | ORAL | Status: DC | PRN

## 2023-08-08 MED ORDER — DONEPEZIL HCL 5 MG PO TABS
5.0000 mg | ORAL_TABLET | Freq: Every day | ORAL | Status: DC
  Administered 2023-08-09: 5 mg via ORAL
  Filled 2023-08-08: qty 1

## 2023-08-08 MED ORDER — ACETAMINOPHEN 325 MG PO TABS: 650.0000 mg | ORAL_TABLET | ORAL | Status: DC | PRN

## 2023-08-08 MED ORDER — APIXABAN 5 MG PO TABS
5.0000 mg | ORAL_TABLET | Freq: Two times a day (BID) | ORAL | Status: DC
  Administered 2023-08-08 – 2023-08-09 (×2): 5 mg via ORAL
  Filled 2023-08-08 (×2): qty 1

## 2023-08-08 MED ORDER — STROKE: EARLY STAGES OF RECOVERY BOOK
Freq: Once | Status: AC
  Filled 2023-08-08: qty 1

## 2023-08-08 MED ORDER — IOHEXOL 350 MG/ML SOLN
75.0000 mL | Freq: Once | INTRAVENOUS | Status: AC | PRN
  Administered 2023-08-08: 75 mL via INTRAVENOUS

## 2023-08-08 MED ORDER — INSULIN ASPART 100 UNIT/ML IJ SOLN
0.0000 [IU] | Freq: Three times a day (TID) | INTRAMUSCULAR | Status: DC
  Administered 2023-08-09: 1 [IU] via SUBCUTANEOUS

## 2023-08-08 MED ORDER — MIRABEGRON ER 25 MG PO TB24
25.0000 mg | ORAL_TABLET | Freq: Every day | ORAL | Status: DC
  Administered 2023-08-09: 25 mg via ORAL
  Filled 2023-08-08: qty 1

## 2023-08-08 MED ORDER — LEVOTHYROXINE SODIUM 50 MCG PO TABS
50.0000 ug | ORAL_TABLET | Freq: Every day | ORAL | Status: DC
  Administered 2023-08-09: 50 ug via ORAL
  Filled 2023-08-08: qty 1

## 2023-08-08 MED ORDER — ACETAMINOPHEN 160 MG/5ML PO SOLN: 650.0000 mg | ORAL | Status: DC | PRN

## 2023-08-08 NOTE — ED Notes (Signed)
Pt transported back from MRI

## 2023-08-08 NOTE — ED Notes (Signed)
Pt ambulatory to bathroom with son.

## 2023-08-08 NOTE — Assessment & Plan Note (Signed)
 Systolic 150s to 829d. -Resume metoprolol  -Hold losartan  for contrast exposure

## 2023-08-08 NOTE — ED Notes (Signed)
 Patient states he took his morning medications this morning. He states he took his BP meds right before they came this morning. Patients son disclosed that patient has been forgetful lately.

## 2023-08-08 NOTE — Assessment & Plan Note (Addendum)
 Reported slurred speech and left-sided weakness, all resolved, patient back to baseline.  On anticoagulation with Eliquis  for A-fib.  CT head and neck-segments of moderate to severe stenosis of the arteries (see full report). - EDP talked with Dr. Voncile, admit for TIA workup, due to transient focal symptoms in the setting of risk factors. Formal neurology consult with Dr. Shelton tomorrow. - Resume Eliquis  - On Zetia , no statin on med list, not listed as allergy -Lipid panel, hemoglobin A1c -PT, OT eval -Recent echo 05/2023 EF of 50 to 55%

## 2023-08-08 NOTE — H&P (Signed)
 History and Physical    James Dyer DOB: 1945/06/05 DOA: 08/08/2023  PCP: [name], MD   Patient coming from: Home  I have personally briefly reviewed patient's old medical records in [facility]  Chief Complaint: Slurred speech, left sided weakness  HPI: James Dyer is a 78 y.o. male with medical history significant for hypertension, diabetes mellitus, paroxysmal atrial fibrillation, OSA, prostate cancer. Patient was brought to the ED by patient's son reports of slurred speech.  At the time of my evaluation, speech is back to baseline.  Patient's son reported at about 8.30 this morning, patient talked to his wife, and she called patient's son stating that patient's speech was slurred.  When son called patient, he was upset, ??crying and speech sounded a bit slurred, reports when he got to patient's house, patient's speech was normal.  Patient had also reported that he had left-sided weakness, but patient was able to walk without difficulty, and dress himself, this had resolved. Patient reports compliance with Eliquis .  ED Course: Temperature 97.8.  Heart rate 76-106.  Blood pressure 150s to 170s.  Respiratory 16 -24.  O2 sats 91 to 96% on room air. CTA head-shows segments of moderate to severe stenosis of a few arteries.(Pls see full report). MRI brain with no acute abnormality.  Moderate to advanced chronic small vessel ischemic changes similar to prior- 04/2022. EDP talked to Dr. Voncile, recommend admission for TIA workup,  Review of Systems: As per HPI all other systems reviewed and negative.  Past Medical History:  Diagnosis Date   Arthritis    HNP- lumbar   Borderline diabetes    Cancer (HCC) 2013   prostate , treated /w radiation    Diabetes mellitus without complication (HCC)    Foot drop, bilateral since birthpt wears braces in shoes   pt must have braces to walk   Hepatitis    pt told had been exposed to hepatitis when he went to donate  blood   History of kidney stones    History of radiation therapy 04/12/11 - 06/06/11   prostate, seminal vesicles   History of stress test    Echocardiogram done - 03/2013   Hypercholesterolemia    states Dr. Richelle Rx Welchol- pt. hs stopped taking because he believe it gives him  leg cramps.   Hypertension    Nephrolithiasis    renal calculi- , also has been told that he has a cyst on kidney - R side    Neuromuscular disorder (HCC)    bilateral foot drop- both feet    Paroxysmal atrial fibrillation (HCC)     Past Surgical History:  Procedure Laterality Date   BACK SURGERY     CARDIOVERSION N/A 02/28/2021   Procedure: CARDIOVERSION;  Surgeon: Francyne Headland, MD;  Location: [facility];  Service: Cardiovascular;  Laterality: N/A;   COLONOSCOPY N/A 07/22/2012   Procedure: COLONOSCOPY;  Surgeon: Oneil DELENA Budge, MD;  Location: [facility];  Service: Gastroenterology;  Laterality: N/A;   ESOPHAGEAL DILATION N/A 04/22/2019   Procedure: ESOPHAGEAL DILATION;  Surgeon: Golda Claudis PENNER, MD;  Location: [facility];  Service: Endoscopy;  Laterality: N/A;   ESOPHAGOGASTRODUODENOSCOPY N/A 04/22/2019   Procedure: ESOPHAGOGASTRODUODENOSCOPY (EGD);  Surgeon: Golda Claudis PENNER, MD;  Location: [facility];  Service: Endoscopy;  Laterality: N/A;  145   EXTRACORPOREAL SHOCK WAVE LITHOTRIPSY Left 03/04/2017   Procedure: LEFT EXTRACORPOREAL SHOCK WAVE LITHOTRIPSY (ESWL);  Surgeon: Devere Lonni Righter, MD;  Location: [facility];  Service:  Urology;  Laterality: Left;   FOOT SURGERY Right 1980's   calcium  deposit removed   LUMBAR LAMINECTOMY/DECOMPRESSION MICRODISCECTOMY Right 09/01/2013   Procedure: RIGHT LUMBAR FOUR-FIVE LAMINECTOMY/DECOMPRESSION MICRODISCECTOMY 1 LEVEL;  Surgeon: Rockey Peru, MD;  Location: [facility];  Service: Neurosurgery;  Laterality: Right;  Right L45 microdiskectomy   prostate biopsy  2013   SHOULDER OPEN ROTATOR CUFF REPAIR Left 09/09/2012   Procedure: LEFT ROTATOR CUFF REPAIR  SHOULDER OPEN;  Surgeon: Tanda DELENA Heading, MD;  Location: [facility];  Service: Orthopedics;  Laterality: Left;     reports that he quit smoking about 56 years ago. His smoking use included cigarettes. He started smoking about 61 years ago. He has a 5 pack-year smoking history. He has never used smokeless tobacco. He reports current alcohol use. He reports that he does not use drugs.  Allergies  Allergen Reactions   Latex Other (See Comments)    sores in mouth after teeth cleaned     Prior to Admission medications   Medication Sig Start Date End Date Taking? Authorizing Provider  apixaban  (ELIQUIS ) 5 MG TABS tablet Take 1 tablet (5 mg total) by mouth 2 (two) times daily. 09/11/22  Yes Miriam Norris, NP  donepezil  (ARICEPT ) 5 MG tablet Take 5 mg by mouth daily. 06/06/22  Yes [provider]  ezetimibe  (ZETIA ) 10 MG tablet Take 1 tablet (10 mg total) by mouth daily. 05/29/22  Yes Landy Barnie RAMAN, NP  glimepiride (AMARYL) 2 MG tablet Take 2 mg by mouth daily with breakfast.   Yes [provider]  levothyroxine  (SYNTHROID ) 50 MCG tablet Take 50 mcg by mouth daily. 05/03/23  Yes [provider]  losartan  (COZAAR ) 100 MG tablet Take 100 mg by mouth daily. 03/03/23  Yes [provider]  meclizine (ANTIVERT) 25 MG tablet Take 25 mg by mouth 3 (three) times daily as needed for dizziness.   Yes [provider]  metFORMIN  (GLUCOPHAGE ) 500 MG tablet Take 500 mg by mouth 2 (two) times daily. 11/23/22  Yes [provider]  metoprolol  tartrate (LOPRESSOR ) 25 MG tablet TAKE 1 TABLET(25 MG) BY MOUTH TWICE DAILY 03/26/23  Yes Miriam Norris, NP  nitroGLYCERIN  (NITROSTAT ) 0.4 MG SL tablet Place 1 tablet (0.4 mg total) under the tongue every 5 (five) minutes as needed for chest pain. 02/01/23 08/08/23 Yes Miriam Norris, NP  pseudoephedrine (SUDAFED) 120 MG 12 hr tablet Take 120 mg by mouth every 12 (twelve) hours as needed for congestion.   Yes [provider]  Vibegron  (GEMTESA ) 75 MG TABS Take 1 tablet (75 mg total) by mouth daily. 07/22/23  Yes Matilda Senior, MD    Physical Exam: Vitals:   08/08/23 1042 08/08/23 1300 08/08/23 1330 08/08/23 1531  BP:  (!) 175/144 (!) 158/107   Pulse: (!) 106 82 76   Resp: (!) 24 (!) 23 20   Temp:    97.9 F (36.6 C)  TempSrc:    Oral  SpO2: 91% 94% 94%   Weight:      Height:        Constitutional: NAD, calm, comfortable Vitals:   08/08/23 1042 08/08/23 1300 08/08/23 1330 08/08/23 1531  BP:  (!) 175/144 (!) 158/107   Pulse: (!) 106 82 76   Resp: (!) 24 (!) 23 20   Temp:    97.9 F (36.6 C)  TempSrc:    Oral  SpO2: 91% 94% 94%   Weight:      Height:       Eyes:  PERRL, lids and conjunctivae normal ENMT: Mucous membranes are moist. Neck: normal, supple, no masses, no thyromegaly Respiratory: clear to auscultation bilaterally, no wheezing, no crackles. Normal respiratory effort. No accessory muscle use.  Cardiovascular: Irregular rate and rhythm, no murmurs / rubs / gallops. No extremity edema.  Extremities warm Abdomen: no tenderness, no masses palpated. No hepatosplenomegaly.   Musculoskeletal: no clubbing / cyanosis. No joint deformity upper and lower extremities.   Skin: no rashes, lesions, ulcers. No induration Neurologic: No facial asymmetry, speech fluent, EOMI, sensation intact globally, good and equal grip strength bilaterally, 5 of 5 strength all extremities Psychiatric: Normal judgment and insight. Alert and oriented x 3. Normal mood.   Labs on Admission: I have personally reviewed following labs and imaging studies  CBC: Recent Labs  Lab 08/08/23 1035  WBC 8.4  NEUTROABS 4.8  HGB 14.9  HCT 45.1  MCV 89.0  PLT 199   Basic Metabolic Panel: Recent Labs  Lab 08/08/23 1035  NA 139  K 3.6  CL 103  CO2 23  GLUCOSE 153*  BUN 22  CREATININE 0.97  CALCIUM  9.0   GFR: Estimated Creatinine Clearance: 81.2 mL/min (by C-G formula based on SCr of 0.97 mg/dL). Liver  Function Tests: Recent Labs  Lab 08/08/23 1035  AST 30  ALT 31  ALKPHOS 48  BILITOT 0.8  PROT 7.1  ALBUMIN  3.8   CBG: Recent Labs  Lab 08/08/23 1016  GLUCAP 157*   Urine analysis:    Component Value Date/Time   COLORURINE STRAW (A) 08/08/2023 1551   APPEARANCEUR CLEAR 08/08/2023 1551   APPEARANCEUR Clear 06/25/2023 1128   LABSPEC 1.021 08/08/2023 1551   PHURINE 7.0 08/08/2023 1551   GLUCOSEU NEGATIVE 08/08/2023 1551   HGBUR SMALL (A) 08/08/2023 1551   BILIRUBINUR NEGATIVE 08/08/2023 1551   BILIRUBINUR Negative 06/25/2023 1128   KETONESUR NEGATIVE 08/08/2023 1551   PROTEINUR 30 (A) 08/08/2023 1551   UROBILINOGEN 0.2 09/05/2012 1106   NITRITE NEGATIVE 08/08/2023 1551   LEUKOCYTESUR NEGATIVE 08/08/2023 1551    Radiological Exams on Admission: CT ANGIO HEAD NECK W WO CM Result Date: 08/08/2023 CLINICAL DATA:  Neuro deficit, acute, stroke suspected EXAM: CT ANGIOGRAPHY HEAD AND NECK WITH AND WITHOUT CONTRAST TECHNIQUE: Multidetector CT imaging of the head and neck was performed using the standard protocol during bolus administration of intravenous contrast. Multiplanar CT image reconstructions and MIPs were obtained to evaluate the vascular anatomy. Carotid stenosis measurements (when applicable) are obtained utilizing NASCET criteria, using the distal internal carotid diameter as the denominator. RADIATION DOSE REDUCTION: This exam was performed according to the departmental dose-optimization program which includes automated exposure control, adjustment of the mA and/or kV according to patient size and/or use of iterative reconstruction technique. CONTRAST:  75mL OMNIPAQUE  IOHEXOL  350 MG/ML SOLN COMPARISON:  CT the head dated April 23, 2023. FINDINGS: CT HEAD FINDINGS Brain: Age-related atrophy and mild-to-moderate periventricular and deep cerebral white matter disease. No evidence of hemorrhage, mass, cortical infarct or hydrocephalus. Vascular: Atheromatous calcifications within  the carotid siphons. Skull: Intact and unremarkable. Sinuses/Orbits: Clear paranasal sinuses.  Normal orbits. Other: None. Review of the MIP images confirms the above findings CTA NECK FINDINGS Aortic arch: Mild calcific atheromatous disease. Three vessel takeoff of the great arteries. Brachiocephalic artery is normal in caliber. Right carotid system: The common carotid artery is normal in caliber and unremarkable. There is mild calcific plaque within the proximal internal carotid artery, with less than 10% luminal stenosis. There is also a calcific plaque present in the  distal cervical segment, with less than 20% luminal stenosis. Left carotid system: The lower segment of the common carotid artery is tortuous, but normal in caliber. There is calcific plaque within the carotid bulb and proximal internal carotid artery, with less than 20% luminal stenosis. There appears to be moderate to severe stenosis of the origin of the left external carotid artery. Vertebral arteries: The left vertebral artery is dominant. There is moderate to severe stenosis of the V1 segment of the right vertebral artery. There is also moderate stenosis of the lower V2 segment. There is mild to moderate stenosis of the proximal V2 segment of the left vertebral artery. Skeleton: Mild to moderate degenerative changes throughout the cervical spine. No osseous lesions. Other neck: Negative. Upper chest: Negative. Review of the MIP images confirms the above findings CTA HEAD FINDINGS Anterior circulation: Calcific plaque within the carotid siphons. No flow-limiting stenosis. The anterior and middle cerebral arteries and their proximal branches appear normal in caliber without evidence of aneurysm, flow-limiting stenosis or large vessel occlusion. Posterior circulation: There are high-grade stenoses involving the P1 and P2 segments of the right vertebral artery. There is high-grade stenosis of the origin of the P1 artery which is evident on image  237 of series 11. There is also moderate to severe stenosis of the P2 segment evident on image 254. The right posterior communicating artery is hypoplastic or absent. There is moderate stenosis of the proximal P2 segment of the left vertebral artery, which is evident on image 248. The left posterior communicating artery is also diminutive or absent. The basilar artery is normal in caliber. The cerebellar arteries appear to be patent. Venous sinuses: Patent. Anatomic variants: Hypoplastic or absent posterior communicating arteries bilaterally. Review of the MIP images confirms the above findings IMPRESSION: 1. 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 2. Moderate stenosis of the proximal P2 segment of the left posterior cerebral artery. 3. Moderate to severe stenosis of the V1 segment of the right vertebral artery and moderate stenosis of the proximal V2 segment. Electronically Signed   By: Evalene Coho M.D.   On: 08/08/2023 12:32   EKG: Independently reviewed.  Atrial fibrillation, rate 91, QTc 462.  No significant ST or T wave changes from prior.  Assessment/Plan Principal Problem:   TIA (transient ischemic attack) Active Problems:   Type 2 diabetes mellitus with hypoglycemia (HCC)   Paroxysmal atrial fibrillation (HCC)   Hypertension associated with type 2 diabetes mellitus (HCC)   Essential (primary) hypertension   OSA (obstructive sleep apnea)   Assessment and Plan: * TIA (transient ischemic attack) Reported slurred speech and left-sided weakness, all resolved, patient back to baseline.  On anticoagulation with Eliquis  for A-fib.  CT head and neck-segments of moderate to severe stenosis of the arteries (see full report). - EDP talked with Dr. Voncile, admit for TIA workup, due to transient focal symptoms in the setting of risk factors. Formal neurology consult with Dr. Shelton tomorrow. - Resume Eliquis  - On Zetia , no statin on med list, not  listed as allergy -Lipid panel, hemoglobin A1c -PT, OT eval -Recent echo 05/2023 EF of 50 to 55%  Essential (primary) hypertension Systolic 150s to 829d. -Resume metoprolol  -Hold losartan  for contrast exposure  Hypertension associated with type 2 diabetes mellitus (HCC) -Hgba1c -Hold metformin  and glipizide while inpatient - SSI- S  Paroxysmal atrial fibrillation (HCC) Rate controlled and on anticoagulation. -Resume metoprolol , Eliquis   DVT prophylaxis: Eliquis  Code Status: FULL code  Family Communication: Son- Kairos Panetta at bedside Disposition Plan: ~ 1 -2 days Consults called: Neurology Admission status:  Obs tele   Author: Tully FORBES Carwin, MD 08/08/2023 5:23 PM  For on call review [email or web address].

## 2023-08-08 NOTE — ED Notes (Signed)
 Pt transported to MRI

## 2023-08-08 NOTE — Assessment & Plan Note (Addendum)
-  Hgba1c -Hold metformin  and glipizide while inpatient - SSI- S

## 2023-08-08 NOTE — ED Provider Notes (Signed)
 [place] EMERGENCY DEPARTMENT AT [facility] Provider Note   CSN: [id number] Arrival date & time: 08/08/23  1010     Patient presents with: Transient Ischemic Attack   James Dyer is a 78 y.o. male.   Patient has a history of hypertension diabetes atrial fibs.  He is on Eliquis .  He had an episode of slurred speech earlier today which has resolved.  He also  has weakness in his left leg which has been going on for couple weeks.  Patient does have a history of foot drop in the legs.  The history is provided by the patient and medical records. No language interpreter was used.  Weakness Severity:  Mild Onset quality:  Gradual Timing:  Constant Progression:  Waxing and waning Chronicity:  New Context: not alcohol use   Relieved by:  Nothing Worsened by:  Nothing Associated symptoms: no abdominal pain, no chest pain, no cough, no diarrhea, no frequency, no headaches and no seizures        Prior to Admission medications   Medication Sig Start Date End Date Taking? Authorizing Provider  apixaban  (ELIQUIS ) 5 MG TABS tablet Take 1 tablet (5 mg total) by mouth 2 (two) times daily. 09/11/22  Yes Miriam Norris, NP  donepezil  (ARICEPT ) 5 MG tablet Take 5 mg by mouth daily. 06/06/22  Yes [provider]  ezetimibe  (ZETIA ) 10 MG tablet Take 1 tablet (10 mg total) by mouth daily. 05/29/22  Yes Landy Barnie RAMAN, NP  glimepiride (AMARYL) 2 MG tablet Take 2 mg by mouth daily with breakfast.   Yes [provider]  levothyroxine  (SYNTHROID ) 50 MCG tablet Take 50 mcg by mouth daily. 05/03/23  Yes [provider]  losartan  (COZAAR ) 100 MG tablet Take 100 mg by mouth daily. 03/03/23  Yes [provider]  meclizine (ANTIVERT) 25 MG tablet Take 25 mg by mouth 3 (three) times daily as needed for dizziness.   Yes [provider]  metFORMIN  (GLUCOPHAGE ) 500 MG tablet Take 500 mg by mouth 2 (two) times daily. 11/23/22  Yes [provider]   metoprolol  tartrate (LOPRESSOR ) 25 MG tablet TAKE 1 TABLET(25 MG) BY MOUTH TWICE DAILY 03/26/23  Yes Miriam Norris, NP  nitroGLYCERIN  (NITROSTAT ) 0.4 MG SL tablet Place 1 tablet (0.4 mg total) under the tongue every 5 (five) minutes as needed for chest pain. 02/01/23 08/08/23 Yes Miriam Norris, NP  pseudoephedrine (SUDAFED) 120 MG 12 hr tablet Take 120 mg by mouth every 12 (twelve) hours as needed for congestion.   Yes [provider]  Vibegron  (GEMTESA ) 75 MG TABS Take 1 tablet (75 mg total) by mouth daily. 07/22/23  Yes Matilda Senior, MD    Allergies: Latex    Review of Systems  Constitutional:  Negative for appetite change and fatigue.  HENT:  Negative for congestion, ear discharge and sinus pressure.        Slurred speech  Eyes:  Negative for discharge.  Respiratory:  Negative for cough.   Cardiovascular:  Negative for chest pain.  Gastrointestinal:  Negative for abdominal pain and diarrhea.  Genitourinary:  Negative for frequency and hematuria.  Musculoskeletal:  Negative for back pain.  Skin:  Negative for rash.  Neurological:  Positive for weakness. Negative for seizures and headaches.  Psychiatric/Behavioral:  Negative for hallucinations.     Updated Vital Signs BP (!) 158/107   Pulse 76   Temp 97.8 F (36.6 C) (Oral)   Resp 20   Ht 5' 9 (1.753 m)  Wt 119 kg   SpO2 94%   BMI 38.74 kg/m   Physical Exam Vitals and nursing note reviewed.  Constitutional:      Appearance: He is well-developed.  HENT:     Head: Normocephalic.     Nose: Nose normal.  Eyes:     General: No scleral icterus.    Conjunctiva/sclera: Conjunctivae normal.  Neck:     Thyroid : No thyromegaly.  Cardiovascular:     Rate and Rhythm: Normal rate and regular rhythm.     Heart sounds: No murmur heard.    No friction rub. No gallop.  Pulmonary:     Breath sounds: No stridor. No wheezing or rales.  Chest:     Chest wall: No tenderness.  Abdominal:     General: There is no  distension.     Tenderness: There is no abdominal tenderness. There is no rebound.  Musculoskeletal:        General: Normal range of motion.     Cervical back: Neck supple.  Lymphadenopathy:     Cervical: No cervical adenopathy.  Skin:    Findings: No erythema or rash.  Neurological:     Mental Status: He is alert and oriented to person, place, and time.     Motor: No abnormal muscle tone.     Coordination: Coordination normal.  Psychiatric:        Behavior: Behavior normal.     (all labs ordered are listed, but only abnormal results are displayed) Labs Reviewed  COMPREHENSIVE METABOLIC PANEL WITH GFR - Abnormal; Notable for the following components:      Result Value   Glucose, Bld 153 (*)    All other components within normal limits  CBG MONITORING, ED - Abnormal; Notable for the following components:   Glucose-Capillary 157 (*)    All other components within normal limits  CBC WITH DIFFERENTIAL/PLATELET  URINALYSIS, ROUTINE W REFLEX MICROSCOPIC    EKG: None  Radiology: CT ANGIO HEAD NECK W WO CM Result Date: 08/08/2023 CLINICAL DATA:  Neuro deficit, acute, stroke suspected EXAM: CT ANGIOGRAPHY HEAD AND NECK WITH AND WITHOUT CONTRAST TECHNIQUE: Multidetector CT imaging of the head and neck was performed using the standard protocol during bolus administration of intravenous contrast. Multiplanar CT image reconstructions and MIPs were obtained to evaluate the vascular anatomy. Carotid stenosis measurements (when applicable) are obtained utilizing NASCET criteria, using the distal internal carotid diameter as the denominator. RADIATION DOSE REDUCTION: This exam was performed according to the departmental dose-optimization program which includes automated exposure control, adjustment of the mA and/or kV according to patient size and/or use of iterative reconstruction technique. CONTRAST:  75mL OMNIPAQUE  IOHEXOL  350 MG/ML SOLN COMPARISON:  CT the head dated April 23, 2023. FINDINGS:  CT HEAD FINDINGS Brain: Age-related atrophy and mild-to-moderate periventricular and deep cerebral white matter disease. No evidence of hemorrhage, mass, cortical infarct or hydrocephalus. Vascular: Atheromatous calcifications within the carotid siphons. Skull: Intact and unremarkable. Sinuses/Orbits: Clear paranasal sinuses.  Normal orbits. Other: None. Review of the MIP images confirms the above findings CTA NECK FINDINGS Aortic arch: Mild calcific atheromatous disease. Three vessel takeoff of the great arteries. Brachiocephalic artery is normal in caliber. Right carotid system: The common carotid artery is normal in caliber and unremarkable. There is mild calcific plaque within the proximal internal carotid artery, with less than 10% luminal stenosis. There is also a calcific plaque present in the distal cervical segment, with less than 20% luminal stenosis. Left carotid system: The lower segment of the  common carotid artery is tortuous, but normal in caliber. There is calcific plaque within the carotid bulb and proximal internal carotid artery, with less than 20% luminal stenosis. There appears to be moderate to severe stenosis of the origin of the left external carotid artery. Vertebral arteries: The left vertebral artery is dominant. There is moderate to severe stenosis of the V1 segment of the right vertebral artery. There is also moderate stenosis of the lower V2 segment. There is mild to moderate stenosis of the proximal V2 segment of the left vertebral artery. Skeleton: Mild to moderate degenerative changes throughout the cervical spine. No osseous lesions. Other neck: Negative. Upper chest: Negative. Review of the MIP images confirms the above findings CTA HEAD FINDINGS Anterior circulation: Calcific plaque within the carotid siphons. No flow-limiting stenosis. The anterior and middle cerebral arteries and their proximal branches appear normal in caliber without evidence of aneurysm, flow-limiting  stenosis or large vessel occlusion. Posterior circulation: There are high-grade stenoses involving the P1 and P2 segments of the right vertebral artery. There is high-grade stenosis of the origin of the P1 artery which is evident on image 237 of series 11. There is also moderate to severe stenosis of the P2 segment evident on image 254. The right posterior communicating artery is hypoplastic or absent. There is moderate stenosis of the proximal P2 segment of the left vertebral artery, which is evident on image 248. The left posterior communicating artery is also diminutive or absent. The basilar artery is normal in caliber. The cerebellar arteries appear to be patent. Venous sinuses: Patent. Anatomic variants: Hypoplastic or absent posterior communicating arteries bilaterally. Review of the MIP images confirms the above findings IMPRESSION: 1. 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 2. Moderate stenosis of the proximal P2 segment of the left posterior cerebral artery. 3. Moderate to severe stenosis of the V1 segment of the right vertebral artery and moderate stenosis of the proximal V2 segment. Electronically Signed   By: Evalene Coho M.D.   On: 08/08/2023 12:32     Procedures   Medications Ordered in the ED  iohexol  (OMNIPAQUE ) 350 MG/ML injection 75 mL (75 mLs Intravenous Contrast Given 08/08/23 1135)   CT head and MRI were unremarkable for acute stroke.  I spoke with Dr. Deedra and he recommended the patient be admitted to medicine for TIA workup and Dr. Shelton will consult tomorrow                                 Medical Decision Making Amount and/or Complexity of Data Reviewed Labs: ordered. Radiology: ordered.  Risk Prescription drug management. Decision regarding hospitalization.   TIA, patient will be admitted to medicine with neurology consult     Final diagnoses:  TIA (transient ischemic attack)    ED Discharge Orders      None          Suzette Pac, MD 08/10/23 [phone]

## 2023-08-08 NOTE — ED Notes (Signed)
 Pt is aware that a urine sample is needed. Urinal is at bedside

## 2023-08-08 NOTE — Assessment & Plan Note (Signed)
 Rate controlled and on anticoagulation. -Resume metoprolol , Eliquis 

## 2023-08-08 NOTE — ED Triage Notes (Signed)
 Pt arrived via POV from home with family who report noticing Pt had slurred speech earlier this morning around 0830 and Pt was unable to stand. Pt endorses left siide weakness, and reports his slurred speech is improving. LKW was 2200 last night. Pt reports Hx of TIA's. Pt reports he did take his blood thinner this morning. Pt denies any injuries or recent falls. CBG in Triage is 157. EDP Notified.

## 2023-08-09 DIAGNOSIS — G459 Transient cerebral ischemic attack, unspecified: Secondary | ICD-10-CM | POA: Diagnosis not present

## 2023-08-09 LAB — HEMOGLOBIN A1C
Hgb A1c MFr Bld: 7.5 % — ABNORMAL HIGH (ref 4.8–5.6)
Mean Plasma Glucose: 169 mg/dL

## 2023-08-09 LAB — LIPID PANEL
Cholesterol: 160 mg/dL (ref 0–200)
HDL: 45 mg/dL (ref >40)
LDL Cholesterol: 80 mg/dL (ref 0–99)
Total CHOL/HDL Ratio: 3.6 ratio
Triglycerides: 173 mg/dL — ABNORMAL HIGH (ref <150)
VLDL: 35 mg/dL (ref 0–40)

## 2023-08-09 LAB — GLUCOSE, CAPILLARY
Glucose-Capillary: 141 mg/dL — ABNORMAL HIGH (ref 70–99)
Glucose-Capillary: 258 mg/dL — ABNORMAL HIGH (ref 70–99)

## 2023-08-09 MED ORDER — LOSARTAN POTASSIUM 25 MG PO TABS
25.0000 mg | ORAL_TABLET | Freq: Every day | ORAL | Status: DC
  Administered 2023-08-09: 25 mg via ORAL
  Filled 2023-08-09: qty 1

## 2023-08-09 MED ORDER — EZETIMIBE 10 MG PO TABS
10.0000 mg | ORAL_TABLET | Freq: Every day | ORAL | Status: DC
  Administered 2023-08-09: 10 mg via ORAL
  Filled 2023-08-09: qty 1

## 2023-08-09 MED ORDER — NITROGLYCERIN 0.4 MG SL SUBL: 0.4000 mg | SUBLINGUAL_TABLET | SUBLINGUAL | Status: DC | PRN

## 2023-08-09 MED ORDER — LOSARTAN POTASSIUM 25 MG PO TABS: 25.0000 mg | ORAL_TABLET | Freq: Every day | ORAL | 1 refills | Status: AC

## 2023-08-09 NOTE — Evaluation (Signed)
 Occupational Therapy Evaluation Patient Details Name: James Dyer MRN: [id number] DOB: 04/08/1945 Today's Date: 08/09/2023   History of Present Illness   James Dyer is a 78 y.o. male with medical history significant for hypertension, diabetes mellitus, paroxysmal atrial fibrillation, OSA, prostate cancer.  Patient was brought to the ED by patient's son reports of slurred speech.  At the time of my evaluation, speech is back to baseline.  Patient's son reported at about 8.30 this morning, patient talked to his wife, and she called patient's son stating that patient's speech was slurred.  When son called patient, he was upset, ??crying and speech sounded a bit slurred, reports when he got to patient's house, patient's speech was normal.  Patient had also reported that he had left-sided weakness, but patient was able to walk without difficulty, and dress himself, this had resolved.  Patient reports compliance with Eliquis . (per MD)     Clinical Impressions Pt agreeable to OT and PT co-evaluation. Pt appears to be at or near baseline function for mobility and functional ADL's. R UE weak from a previous injury that the pt never had any therapy for at the time. Pt showed interest in possibly trying outpatient therapy now. No other new deficits noted. Pt able to complete ADL's with mod I level of assist and ambulate without RW or SPC but using AFO's. Pt is not recommended for further acute OT services and will be discharged to care of nursing staff for remaining length of stay.              Functional Status Assessment   Patient has not had a recent decline in their functional status     Equipment Recommendations   None recommended by OT             Precautions/Restrictions   Precautions Precautions: Fall Recall of Precautions/Restrictions: Intact Restrictions Weight Bearing Restrictions Per Provider Order: No     Mobility Bed Mobility Overal bed mobility:  Independent                  Transfers Overall transfer level: Modified independent                 General transfer comment: Able to cmoplete ambulatory transfers without physical assist.      Balance Overall balance assessment: Needs assistance Sitting-balance support: No upper extremity supported, Feet supported Sitting balance-Leahy Scale: Good Sitting balance - Comments: seated at EOB   Standing balance support: No upper extremity supported, During functional activity Standing balance-Leahy Scale: Fair Standing balance comment: fair to good                           ADL either performed or assessed with clinical judgement   ADL Overall ADL's : Modified independent                                       General ADL Comments: Able to don AFO's independently; able to ambualte without physical assist using AFO's.     Vision Baseline Vision/History: 1 Wears glasses Ability to See in Adequate Light: 1 Impaired Patient Visual Report: No change from baseline Vision Assessment?: No apparent visual deficits     Perception Perception: Not tested       Praxis Praxis: Not tested       Pertinent Vitals/Pain Pain Assessment Pain Assessment: No/denies  pain     Extremity/Trunk Assessment Upper Extremity Assessment Upper Extremity Assessment: LUE deficits/detail;RUE deficits/detail RUE Deficits / Details: 4-/5 shoulder flexion and abduction from a previous injury from the pt's report. Pt report he never got therapy. RUE Sensation: WNL RUE Coordination: WNL LUE Deficits / Details: 4+ to 5/5 MMT grossly. LUE Sensation: WNL LUE Coordination: WNL   Lower Extremity Assessment Lower Extremity Assessment: Defer to PT evaluation   Cervical / Trunk Assessment Cervical / Trunk Assessment: Normal   Communication Communication Communication: No apparent difficulties   Cognition Arousal: Alert Behavior During Therapy: WFL for tasks  assessed/performed Cognition: No apparent impairments                               Following commands: Intact       Cueing  General Comments   Cueing Techniques: Verbal cues;Tactile cues                 Home Living Family/patient expects to be discharged to:: Private residence Living Arrangements: Spouse/significant other Available Help at Discharge: Family;Available PRN/intermittently Type of Home: House Home Access: Stairs to enter [employer] of Steps: 1-2 in front with no rail; 2 in back with R rail Entrance Stairs-Rails: Right Home Layout: One level     Bathroom Shower/Tub: Producer, television/film/video: Standard Bathroom Accessibility: Yes   Home Equipment: Agricultural consultant (2 wheels);Cane - single point;Shower seat - built in          Prior Functioning/Environment Prior Level of Function : Independent/Modified Independent             Mobility Comments: Banker using bilateral AFO's and PRN use of SPC and RW. Drives. ADLs Comments: Independent ADL; assist IADL's.                            Co-evaluation PT/OT/SLP Co-Evaluation/Treatment: Yes Reason for Co-Treatment: To address functional/ADL transfers   OT goals addressed during session: ADL's and self-care      AM-PAC OT 6 Clicks Daily Activity     Outcome Measure Help from another person eating meals?: None Help from another person taking care of personal grooming?: None Help from another person toileting, which includes using toliet, bedpan, or urinal?: None Help from another person bathing (including washing, rinsing, drying)?: None Help from another person to put on and taking off regular upper body clothing?: None Help from another person to put on and taking off regular lower body clothing?: None 6 Click Score: 24   End of Session Equipment Utilized During Treatment: Gait belt  Activity Tolerance: Patient tolerated treatment  well Patient left: in chair  OT Visit Diagnosis: Muscle weakness (generalized) (M62.81);Other symptoms and signs involving the nervous system (M70.101)                Time: 9150-9091 OT Time Calculation (min): 19 min Charges:  OT General Charges $OT Visit: 1 Visit OT Evaluation $OT Eval Low Complexity: 1 Low  Jose Alleyne OT, MOT  Jayson Person 08/09/2023, 9:48 AM

## 2023-08-09 NOTE — Evaluation (Signed)
 Physical Therapy Brief Evaluation and Discharge Note Patient Details Name: James Dyer MRN: [id number] DOB: Oct 01, 1945 Today's Date: 08/09/2023   History of Present Illness  James Dyer is a 78 y.o. male with medical history significant for hypertension, diabetes mellitus, paroxysmal atrial fibrillation, OSA, prostate cancer.  Patient was brought to the ED by patient's son reports of slurred speech.  At the time of my evaluation, speech is back to baseline.  Patient's son reported at about 8.30 this morning, patient talked to his wife, and she called patient's son stating that patient's speech was slurred.  When son called patient, he was upset, ??crying and speech sounded a bit slurred, reports when he got to patient's house, patient's speech was normal.  Patient had also reported that he had left-sided weakness, but patient was able to walk without difficulty, and dress himself, this had resolved.  Patient reports compliance with Eliquis . (per MD)  Clinical Impression  Patient tolerated co-evaluation well. Pt with balance deficits but non-acute due to bilateral AFO. Pt is performing at functional baseline, no safety concerns noted throughout evaluation however does utilize heavy visual component for ambulation.  Recommending OP PT services to address balance deficits.  PT to sign off at this time. PT discharging pt to mobility technician and nursing staff for further mobility activities. Please reconsult acute PT services if functional changes occur.  Thank you.       PT Assessment    Assistance Needed at Discharge       Equipment Recommendations None recommended by PT  Recommendations for Other Services       Precautions/Restrictions Precautions Precautions: Fall Recall of Precautions/Restrictions: Intact Restrictions Weight Bearing Restrictions Per Provider Order: No        Mobility  Bed Mobility          Transfers Overall transfer level: Modified independent                  General transfer comment: Able to cmoplete ambulatory transfers without physical assist.    Ambulation/Gait Ambulation/Gait assistance: Modified independent (Device/Increase time) Gait Distance (Feet): 95 Feet Assistive device: None Gait Pattern/deviations: Wide base of support, WFL(Within Functional Limits)   General Gait Details: 1ft with parts of DGI completed, mild LOB laterally, non acute as pt has bilateral AFO and hx of balance deficits  Home Activity Instructions    Stairs            Modified Rankin (Stroke Patients Only)        Balance   Sitting-balance support: No upper extremity supported, Feet supported Sitting balance-Leahy Scale: Good Sitting balance - Comments: seated at EOB   Standing balance support: No upper extremity supported, During functional activity Standing balance-Leahy Scale: Fair Standing balance comment: fair to good- multiple steps for stepping reaction skills          Pertinent Vitals/Pain   Pain Assessment Pain Assessment: No/denies pain     Home Living   Living Arrangements: Spouse/significant other       Home Equipment: Rolling Walker (2 wheels);Cane - single point;Shower seat - built in        Prior Function        UE/LE Scientist, research (medical) Communication: No apparent difficulties     Cognition         General Comments      Exercises     Assessment/Plan    PT  Problem List Decreased balance;Decreased strength       PT Visit Diagnosis Unsteadiness on feet (R26.81)    No Skilled PT     Co-evaluation PT/OT/SLP Co-Evaluation/Treatment: Yes Reason for Co-Treatment: To address functional/ADL transfers PT goals addressed during session: Mobility/safety with mobility OT goals addressed during session: ADL's and self-care        AMPAC 6 Clicks Help needed turning from your back to your side while in a flat bed without using bedrails?:  None Help needed moving from lying on your back to sitting on the side of a flat bed without using bedrails?: None Help needed moving to and from a bed to a chair (including a wheelchair)?: None Help needed standing up from a chair using your arms (e.g., wheelchair or bedside chair)?: None Help needed to walk in hospital room?: None Help needed climbing 3-5 steps with a railing? : None 6 Click Score: 24      End of Session Equipment Utilized During Treatment: Gait belt Activity Tolerance: Patient tolerated treatment well Patient left: in chair;with call bell/phone within reach Nurse Communication: Mobility status PT Visit Diagnosis: Unsteadiness on feet (R26.81)     Time: 9149-9093 PT Time Calculation (min) (ACUTE ONLY): 16 min  Charges:   PT Evaluation $PT Eval Low Complexity: 1 Low      Omega JONETTA Donna ALMETA, DPT [facility] [place] [address] [phone] office  Omega JONETTA Donna  08/09/2023, 10:16 AM

## 2023-08-09 NOTE — Discharge Summary (Signed)
 Physician Discharge Summary   Patient: James Dyer MRN: [id number] DOB: 10/06/1945  Admit date:     08/08/2023  Discharge date: 08/09/23  Discharge Physician: Adriana DELENA Grams   PCP: [name], MD   Recommendations at discharge:   Follow-up with PCP in 1-2 weeks Monitor blood pressure, CBGs closely keep log present to PCP with further medication modification may be necessary Continue Eliquis    Discharge Diagnoses: Principal Problem:   TIA (transient ischemic attack) Active Problems:   Type 2 diabetes mellitus with hypoglycemia (HCC)   Paroxysmal atrial fibrillation (HCC)   Hypertension associated with type 2 diabetes mellitus (HCC)   Essential (primary) hypertension   OSA (obstructive sleep apnea)  Resolved Problems:   * No resolved hospital problems. [facility] Course:  James Dyer is a 78 y.o. male with medical history significant for hypertension, diabetes mellitus, paroxysmal atrial fibrillation, OSA, prostate cancer. Patient was brought to the ED by patient's son reports of slurred speech.  At the time of my evaluation, speech is back to baseline.  Patient's son reported at about 8.30 this morning, patient talked to his wife, and she called patient's son stating that patient's speech was slurred.  When son called patient, he was upset,crying and speech sounded a bit slurred, reports when he got to patient's house, patient's speech was normal.  Patient had also reported that he had left-sided weakness, but patient was able to walk without difficulty, and dress himself, this had resolved. Patient reports compliance with Eliquis .     ED Course: Temperature 97.8.  Heart rate 76-106.  Blood pressure 150s to 170s.  Respiratory 16 -24.  O2 sats 91 to 96% on room air. CTA head-shows segments of moderate to severe stenosis of a few arteries.(Pls see full report). MRI brain with no acute abnormality.  Moderate to advanced chronic small vessel ischemic changes similar to  prior- 04/2022. EDP talked to Dr. Voncile, recommend admission for TIA workup,     * TIA (transient ischemic attack) Reported slurred speech and left-sided weakness, all resolved, patient back to baseline.  On anticoagulation with Eliquis  for A-fib.  CT head and neck-segments of moderate to severe stenosis of the arteries (see full report). - EDP talked with Dr. Voncile, admit for TIA workup, due to transient focal symptoms in the setting of risk factors. Formal neurology consult with Dr. Shelton  - Resume Eliquis  - On Zetia , no statin on med list, on cholesterol shots every 3 months per family --Elevated triglyceride, LDL at 80, -PT, OT eval-patient at baseline no further recommendations -Recent echo 05/2023 EF of 50 to 55%   -CT angiogram and MRI of the brain-within normal limits-with no acute intracranial normalities   Essential (primary) hypertension -Continue losartan  at lower dose, resume metoprolol     Hypertension associated with type 2 diabetes mellitus (HCC) - metformin  and glipizide while inpatient - Continue carb modified diabetic diet   Paroxysmal atrial fibrillation (HCC) Rate controlled and on anticoagulation. -Resume metoprolol , Eliquis    Disposition: Home Diet recommendation:  Discharge Diet Orders (From admission, onward)     Start     Ordered   08/09/23 0000  Diet - low sodium heart healthy        08/09/23 1050   08/09/23 0000  Diet Carb Modified        08/09/23 1050           Carb modified diet DISCHARGE MEDICATION: Allergies as of 08/09/2023       Reactions  Latex Other (See Comments)   sores in mouth after teeth cleaned        Medication List     STOP taking these medications    meclizine 25 MG tablet Commonly known as: ANTIVERT       TAKE these medications    apixaban  5 MG Tabs tablet Commonly known as: ELIQUIS  Take 1 tablet (5 mg total) by mouth 2 (two) times daily.   donepezil  5 MG tablet Commonly known as: ARICEPT  Take 5  mg by mouth daily.   ezetimibe  10 MG tablet Commonly known as: ZETIA  Take 1 tablet (10 mg total) by mouth daily.   Gemtesa  75 MG Tabs Generic drug: Vibegron  Take 1 tablet (75 mg total) by mouth daily.   glimepiride 2 MG tablet Commonly known as: AMARYL Take 2 mg by mouth daily with breakfast.   levothyroxine  50 MCG tablet Commonly known as: SYNTHROID  Take 50 mcg by mouth daily.   losartan  25 MG tablet Commonly known as: COZAAR  Take 1 tablet (25 mg total) by mouth daily. What changed:  medication strength how much to take   metFORMIN  500 MG tablet Commonly known as: GLUCOPHAGE  Take 500 mg by mouth 2 (two) times daily.   metoprolol  tartrate 25 MG tablet Commonly known as: LOPRESSOR  TAKE 1 TABLET(25 MG) BY MOUTH TWICE DAILY   nitroGLYCERIN  0.4 MG SL tablet Commonly known as: NITROSTAT  Place 1 tablet (0.4 mg total) under the tongue every 5 (five) minutes as needed for chest pain.        Discharge Exam: Filed Weights   08/08/23 1019 08/08/23 1836  Weight: 119 kg 116.8 kg        General:  AAO x 3,  cooperative, no distress;   HEENT:  Normocephalic, PERRL, otherwise with in Normal limits   Neuro:  CNII-XII intact. , normal motor and sensation, reflexes intact   Lungs:   Clear to auscultation BL, Respirations unlabored,  No wheezes / crackles  Cardio:    S1/S2, RRR, No murmure, No Rubs or Gallops   Abdomen:  Soft, non-tender, bowel sounds active all four quadrants, no guarding or peritoneal signs.  Muscular  skeletal:  Limited exam -global generalized weaknesses - in bed, able to move all 4 extremities,   2+ pulses,  symmetric, No pitting edema  Skin:  Dry, warm to touch, negative for any Rashes,  Wounds: Please see nursing documentation         Condition at discharge: good  The results of significant diagnostics from this hospitalization (including imaging, microbiology, ancillary and laboratory) are listed below for reference.   Imaging Studies: MR  BRAIN WO CONTRAST Result Date: 08/08/2023 CLINICAL DATA:  Provided history: Neuro deficit, acute, stroke suspected. EXAM: MRI HEAD WITHOUT CONTRAST TECHNIQUE: Multiplanar, multiecho pulse sequences of the brain and surrounding structures were obtained without intravenous contrast. COMPARISON:  Non-contrast head CT and CT angiogram head/neck performed earlier today 08/08/2023. Brain MRI 05/13/2022. FINDINGS: Brain: Generalized cerebral atrophy. Multifocal T2 FLAIR hyperintense signal abnormality within the cerebral white matter, nonspecific but compatible with moderate-to-advanced chronic small vessel ischemic disease. Several nonspecific chronic microhemorrhages scattered within the supratentorial brain and cerebellum, similar to the prior MRI. No cortical encephalomalacia is identified. There is no acute infarct. No evidence of an intracranial mass. No extra-axial fluid collection. No midline shift. Vascular: See CTA head/neck performed earlier today. Skull and upper cervical spine: No focal worrisome marrow lesion. Incompletely assessed cervical spondylosis. Sinuses/Orbits: No mass or acute finding within the imaged orbits. Prior right ocular  lens replacement. No significant paranasal sinus disease. Other: Small-volume fluid within bilateral mastoid air cells. IMPRESSION: 1. No evidence of an acute intracranial abnormality. 2. Moderate-to-advanced chronic small vessel ischemic changes within the cerebral white matter, similar to the prior MRI of 05/13/2022. 3. Generalized cerebral atrophy. Electronically Signed   By: Rockey Childs D.O.   On: 08/08/2023 16:24   CT ANGIO HEAD NECK W WO CM Result Date: 08/08/2023 CLINICAL DATA:  Neuro deficit, acute, stroke suspected EXAM: CT ANGIOGRAPHY HEAD AND NECK WITH AND WITHOUT CONTRAST TECHNIQUE: Multidetector CT imaging of the head and neck was performed using the standard protocol during bolus administration of intravenous contrast. Multiplanar CT image reconstructions  and MIPs were obtained to evaluate the vascular anatomy. Carotid stenosis measurements (when applicable) are obtained utilizing NASCET criteria, using the distal internal carotid diameter as the denominator. RADIATION DOSE REDUCTION: This exam was performed according to the departmental dose-optimization program which includes automated exposure control, adjustment of the mA and/or kV according to patient size and/or use of iterative reconstruction technique. CONTRAST:  75mL OMNIPAQUE  IOHEXOL  350 MG/ML SOLN COMPARISON:  CT the head dated April 23, 2023. FINDINGS: CT HEAD FINDINGS Brain: Age-related atrophy and mild-to-moderate periventricular and deep cerebral white matter disease. No evidence of hemorrhage, mass, cortical infarct or hydrocephalus. Vascular: Atheromatous calcifications within the carotid siphons. Skull: Intact and unremarkable. Sinuses/Orbits: Clear paranasal sinuses.  Normal orbits. Other: None. Review of the MIP images confirms the above findings CTA NECK FINDINGS Aortic arch: Mild calcific atheromatous disease. Three vessel takeoff of the great arteries. Brachiocephalic artery is normal in caliber. Right carotid system: The common carotid artery is normal in caliber and unremarkable. There is mild calcific plaque within the proximal internal carotid artery, with less than 10% luminal stenosis. There is also a calcific plaque present in the distal cervical segment, with less than 20% luminal stenosis. Left carotid system: The lower segment of the common carotid artery is tortuous, but normal in caliber. There is calcific plaque within the carotid bulb and proximal internal carotid artery, with less than 20% luminal stenosis. There appears to be moderate to severe stenosis of the origin of the left external carotid artery. Vertebral arteries: The left vertebral artery is dominant. There is moderate to severe stenosis of the V1 segment of the right vertebral artery. There is also moderate stenosis  of the lower V2 segment. There is mild to moderate stenosis of the proximal V2 segment of the left vertebral artery. Skeleton: Mild to moderate degenerative changes throughout the cervical spine. No osseous lesions. Other neck: Negative. Upper chest: Negative. Review of the MIP images confirms the above findings CTA HEAD FINDINGS Anterior circulation: Calcific plaque within the carotid siphons. No flow-limiting stenosis. The anterior and middle cerebral arteries and their proximal branches appear normal in caliber without evidence of aneurysm, flow-limiting stenosis or large vessel occlusion. Posterior circulation: There are high-grade stenoses involving the P1 and P2 segments of the right vertebral artery. There is high-grade stenosis of the origin of the P1 artery which is evident on image 237 of series 11. There is also moderate to severe stenosis of the P2 segment evident on image 254. The right posterior communicating artery is hypoplastic or absent. There is moderate stenosis of the proximal P2 segment of the left vertebral artery, which is evident on image 248. The left posterior communicating artery is also diminutive or absent. The basilar artery is normal in caliber. The cerebellar arteries appear to be patent. Venous sinuses: Patent. Anatomic variants: Hypoplastic  or absent posterior communicating arteries bilaterally. Review of the MIP images confirms the above findings IMPRESSION: 1. 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 2. Moderate stenosis of the proximal P2 segment of the left posterior cerebral artery. 3. Moderate to severe stenosis of the V1 segment of the right vertebral artery and moderate stenosis of the proximal V2 segment. Electronically Signed   By: Evalene Coho M.D.   On: 08/08/2023 12:32    Microbiology: Results for orders placed or performed in visit on 06/25/23  Microscopic Examination     Status: Abnormal   Collection Time:  06/25/23 11:28 AM   Urine  Result Value Ref Range Status   WBC, UA 0-5 0 - 5 /hpf Final   RBC, Urine 3-10 (A) 0 - 2 /hpf Final   Epithelial Cells (non renal) 0-10 0 - 10 /hpf Final   Bacteria, UA None seen None seen/Few Final    Labs: CBC: Recent Labs  Lab 08/08/23 1035  WBC 8.4  NEUTROABS 4.8  HGB 14.9  HCT 45.1  MCV 89.0  PLT 199   Basic Metabolic Panel: Recent Labs  Lab 08/08/23 1035  NA 139  K 3.6  CL 103  CO2 23  GLUCOSE 153*  BUN 22  CREATININE 0.97  CALCIUM  9.0   Liver Function Tests: Recent Labs  Lab 08/08/23 1035  AST 30  ALT 31  ALKPHOS 48  BILITOT 0.8  PROT 7.1  ALBUMIN  3.8   CBG: Recent Labs  Lab 08/08/23 1016 08/08/23 2225 08/09/23 0742  GLUCAP 157* 161* 141*    Discharge time spent: greater than 30 minutes.  Signed: Adriana DELENA Grams, MD [facility] 08/09/2023

## 2023-08-09 NOTE — TOC Transition Note (Signed)
 Transition of Care [facility]) - Discharge Note   Patient Details  Name: James Dyer MRN: [id number] Date of Birth: [birth year]  [facility]) CM/SW Contact:  Lucie Lunger, LCSWA Phone Number: 08/09/2023, 10:50 AM   Clinical Narrative:    [name] updated that PT is recommending OP PT for pt at D/C. [name] met with pt at bedside to review, he is agreeable and prefers [facility] OP PT clinic. [name] to make referral. TOC signing off.    Final next level of care: OP Rehab Barriers to Discharge: Barriers Resolved   Patient Goals and CMS Choice Patient states their goals for this hospitalization and ongoing recovery are:: return home CMS Medicare.gov Compare Post Acute Care list provided to:: Patient Choice offered to / list presented to : Patient      Discharge Placement                       Discharge Plan and Services Additional resources added to the After Visit Summary for                                       Social Drivers of Health (SDOH) Interventions SDOH Screenings   Food Insecurity: No Food Insecurity (08/08/2023)  Housing: Low Risk  (08/08/2023)  Transportation Needs: No Transportation Needs (08/08/2023)  Utilities: Not At Risk (08/08/2023)  Depression (PHQ2-9): Low Risk  (07/26/2023)  Financial Resource Strain: Low Risk  (11/13/2022)  Physical Activity: Inactive (10/08/2022)  Social Connections: Moderately Integrated (08/08/2023)  Tobacco Use: Medium Risk (08/08/2023)     Readmission Risk Interventions     No data to display

## 2023-08-09 NOTE — Hospital Course (Signed)
 James Dyer is a 78 y.o. male with medical history significant for hypertension, diabetes mellitus, paroxysmal atrial fibrillation, OSA, prostate cancer. Patient was brought to the ED by patient's son reports of slurred speech.  At the time of my evaluation, speech is back to baseline.  Patient's son reported at about 8.30 this morning, patient talked to his wife, and she called patient's son stating that patient's speech was slurred.  When son called patient, he was upset, ??crying and speech sounded a bit slurred, reports when he got to patient's house, patient's speech was normal.  Patient had also reported that he had left-sided weakness, but patient was able to walk without difficulty, and dress himself, this had resolved. Patient reports compliance with Eliquis .   ED Course: Temperature 97.8.  Heart rate 76-106.  Blood pressure 150s to 170s.  Respiratory 16 -24.  O2 sats 91 to 96% on room air. CTA head-shows segments of moderate to severe stenosis of a few arteries.(Pls see full report). MRI brain with no acute abnormality.  Moderate to advanced chronic small vessel ischemic changes similar to prior- 04/2022. EDP talked to Dr. Voncile, recommend admission for TIA workup,   Assessment and Plan:  * TIA (transient ischemic attack) Reported slurred speech and left-sided weakness, all resolved, patient back to baseline.  On anticoagulation with Eliquis  for A-fib.  CT head and neck-segments of moderate to severe stenosis of the arteries (see full report). - EDP talked with Dr. Voncile, admit for TIA workup, due to transient focal symptoms in the setting of risk factors. Formal neurology consult with Dr. Shelton tomorrow. - Resume Eliquis  - On Zetia , no statin on med list, not listed as allergy -Lipid panel, hemoglobin A1c -PT, OT eval -Recent echo 05/2023 EF of 50 to 55%   Essential (primary) hypertension Systolic 150s to 829d. -Resume metoprolol  -Hold losartan  for contrast exposure    Hypertension associated with type 2 diabetes mellitus (HCC) -Hgba1c -Hold metformin  and glipizide while inpatient - SSI- S   Paroxysmal atrial fibrillation (HCC) Rate controlled and on anticoagulation. -Resume metoprolol , Eliquis 

## 2023-08-09 NOTE — Care Management Obs Status (Signed)
 MEDICARE OBSERVATION STATUS NOTIFICATION   Patient Details  Name: James Dyer MRN: [id number] Date of Birth: 07-Sep-1945   Medicare Observation Status Notification Given:  Yes    Lucie Lunger, LCSWA 08/09/2023, 10:34 AM

## 2023-08-09 NOTE — Progress Notes (Signed)
 SLP Cancellation Note  Patient Details Name: James Dyer MRN: [id number] DOB: [birth year]   Cancelled treatment:       Reason Eval/Treat Not Completed: SLP screened, no needs identified, will sign off;pt noted symptoms have resolved with dysarthria;MRI negative for acute processes.    Pat Harlea Goetzinger,M.S.,CCC-SLP 08/09/2023, 9:41 AM

## 2023-08-12 ENCOUNTER — Telehealth: Payer: Self-pay | Admitting: Nurse Practitioner

## 2023-08-12 DIAGNOSIS — E669 Obesity, unspecified: Secondary | ICD-10-CM

## 2023-08-12 NOTE — Telephone Encounter (Signed)
 Wife says during appointment with [name], NP they discussed possibly starting on weight loss injection. Wife is following up. Wife says NP advised she would read up on the options and call patient, but they haven't heard from anyone. Please advise.

## 2023-08-13 DIAGNOSIS — E039 Hypothyroidism, unspecified: Secondary | ICD-10-CM | POA: Diagnosis not present

## 2023-08-13 DIAGNOSIS — Z8269 Family history of other diseases of the musculoskeletal system and connective tissue: Secondary | ICD-10-CM | POA: Diagnosis not present

## 2023-08-13 DIAGNOSIS — M069 Rheumatoid arthritis, unspecified: Secondary | ICD-10-CM | POA: Diagnosis not present

## 2023-08-13 DIAGNOSIS — E119 Type 2 diabetes mellitus without complications: Secondary | ICD-10-CM | POA: Diagnosis not present

## 2023-08-13 NOTE — Telephone Encounter (Signed)
Patient notified and verbalized understanding. Will place referral.

## 2023-08-14 DIAGNOSIS — Z7189 Other specified counseling: Secondary | ICD-10-CM | POA: Diagnosis not present

## 2023-08-14 DIAGNOSIS — Z7689 Persons encountering health services in other specified circumstances: Secondary | ICD-10-CM | POA: Diagnosis not present

## 2023-08-14 DIAGNOSIS — Z1389 Encounter for screening for other disorder: Secondary | ICD-10-CM | POA: Diagnosis not present

## 2023-08-14 DIAGNOSIS — Z1331 Encounter for screening for depression: Secondary | ICD-10-CM | POA: Diagnosis not present

## 2023-09-10 ENCOUNTER — Other Ambulatory Visit (HOSPITAL_COMMUNITY): Payer: Self-pay

## 2023-09-10 ENCOUNTER — Encounter: Payer: Self-pay | Admitting: Nurse Practitioner

## 2023-09-10 ENCOUNTER — Telehealth: Payer: Self-pay | Admitting: Pharmacy Technician

## 2023-09-10 NOTE — Telephone Encounter (Signed)
 Pharmacy Patient Advocate Encounter  Received notification from [employer] that Prior Authorization for Ozempic has been APPROVED from 09/10/23 to 01/29/24. Ran test claim, Copay is $170.16- one month. This test claim was processed through [facility]- copay amounts may vary at other pharmacies due to pharmacy/plan contracts, or as the patient moves through the different stages of their insurance plan.   PA #/Case ID/Reference #: [id number]

## 2023-09-10 NOTE — Telephone Encounter (Signed)
 Pharmacy Patient Advocate Encounter   Received notification from Fax that prior authorization for Ozempic is required/requested.   Insurance verification completed.   The patient is insured through [facility] .   Per test claim: PA required; PA submitted to above mentioned insurance via Latent Key/confirmation #/EOC [id number] Status is pending

## 2023-09-11 DIAGNOSIS — Z23 Encounter for immunization: Secondary | ICD-10-CM | POA: Diagnosis not present

## 2023-09-16 ENCOUNTER — Other Ambulatory Visit: Payer: Self-pay | Admitting: Urology

## 2023-09-16 DIAGNOSIS — N3281 Overactive bladder: Secondary | ICD-10-CM

## 2023-09-27 ENCOUNTER — Encounter: Payer: Self-pay | Admitting: Nurse Practitioner

## 2023-09-27 ENCOUNTER — Telehealth: Payer: Self-pay | Admitting: Pharmacy Technician

## 2023-09-27 ENCOUNTER — Other Ambulatory Visit (HOSPITAL_COMMUNITY): Payer: Self-pay

## 2023-09-27 ENCOUNTER — Ambulatory Visit: Attending: Cardiology | Admitting: Pharmacist

## 2023-09-27 DIAGNOSIS — I152 Hypertension secondary to endocrine disorders: Secondary | ICD-10-CM | POA: Insufficient documentation

## 2023-09-27 DIAGNOSIS — E11649 Type 2 diabetes mellitus with hypoglycemia without coma: Secondary | ICD-10-CM | POA: Insufficient documentation

## 2023-09-27 DIAGNOSIS — E1159 Type 2 diabetes mellitus with other circulatory complications: Secondary | ICD-10-CM | POA: Diagnosis not present

## 2023-09-27 MED ORDER — MOUNJARO 2.5 MG/0.5ML ~~LOC~~ SOAJ: 2.5000 mg | SUBCUTANEOUS | 0 refills | Status: DC

## 2023-09-27 NOTE — Telephone Encounter (Signed)
 Called pt to let him know about Mounjaro  approval.  Left voicemail to call back.  Rx sent to his pharmacy of choice.

## 2023-09-27 NOTE — Patient Instructions (Addendum)
 Stop taking glipizide  GLP-1 Receptor Agonist Counseling Points This medication reduces your appetite and may make you feel fuller longer.  Stop eating when your body tells you that you are full. This will likely happen sooner than you are used to. Fried/greasy food and sweets may upset your stomach - minimize these as much as possible. Store your medication in the fridge until you are ready to use it. Inject your medication in the fatty tissue of your lower abdominal area (2 inches away from belly button) or upper outer thigh. Rotate injection sites. Common side effects include: nausea, diarrhea/constipation, and heartburn, and are more likely to occur if you overeat. Stop your injection for 7 days prior to surgical procedures requiring anesthesia.  Dosing schedule:  We will touch base with you monthly over the phone. The medication can be increased in monthly intervals depending on tolerability and efficacy.  Tips for success: Write down the reasons why you want to lose weight and post it in a place where you'll see it often.  Start small and work your way up. Keep in mind that it takes time to achieve goals, and small steps add up.  Any additional movements help to burn calories. Taking the stairs rather than the elevator and parking at the far end of your parking lot are easy ways to start. Brisk walking for at least 30 minutes 4 or more days of the week is an excellent goal to work toward  Understanding what it means to feel full: Did you know that it can take 15 minutes or more for your brain to receive the message that you've eaten? That means that, if you eat less food, but consume it slower, you may still feel satisfied.  Eating a lot of fruits and vegetables can also help you feel fuller.  Eat off of smaller plates so that moderate portions don't seem too small  Tips for living a healthier life     Building a Healthy and Balanced Diet Make most of your meal vegetables and  fruits -  of your plate. Aim for color and variety, and remember that potatoes don't count as vegetables on the Healthy Eating Plate because of their negative impact on blood sugar.  Go for whole grains -  of your plate. Whole and intact grains--whole wheat, barley, wheat berries, quinoa, oats, brown rice, and foods made with them, such as whole wheat pasta--have a milder effect on blood sugar and insulin  than white bread, white rice, and other refined grains.  Protein power -  of your plate. Fish, poultry, beans, and nuts are all healthy, versatile protein sources--they can be mixed into salads, and pair well with vegetables on a plate. Limit red meat, and avoid processed meats such as bacon and sausage.  Healthy plant oils - in moderation. Choose healthy vegetable oils like olive, canola, soy, corn, sunflower, peanut, and others, and avoid partially hydrogenated oils, which contain unhealthy trans fats. Remember that low-fat does not mean "healthy."  Drink water , coffee, or tea. Skip sugary drinks, limit milk and dairy products to one to two servings per day, and limit juice to a small glass per day.  Stay active. The red figure running across the Healthy Eating Plate's placemat is a reminder that staying active is also important in weight control.  The main message of the Healthy Eating Plate is to focus on diet quality:  The type of carbohydrate in the diet is more important than the amount of carbohydrate in the  diet, because some sources of carbohydrate--like vegetables (other than potatoes), fruits, whole grains, and beans--are healthier than others. The Healthy Eating Plate also advises consumers to avoid sugary beverages, a major source of calories--usually with little nutritional value--in the American diet. The Healthy Eating Plate encourages consumers to use healthy oils, and it does not set a maximum on the percentage of calories people should get each day from healthy sources  of fat. In this way, the Healthy Eating Plate recommends the opposite of the low-fat message promoted for decades by the USDA.  [employer]  SUGAR  Sugar is a huge problem in the modern day diet. Sugar is a big contributor to heart disease, diabetes, high triglyceride levels, fatty liver disease and obesity. Sugar is hidden in almost all packaged foods/beverages. Added sugar is extra sugar that is added beyond what is naturally found and has no nutritional benefit for your body. The American Heart Association recommends limiting added sugars to no more than 25g for women and 36 grams for men per day. There are many names for sugar including maltose, sucrose (names ending in ose), high fructose corn syrup, molasses, cane sugar, corn sweetener, raw sugar, syrup, honey or fruit juice concentrate.   One of the best ways to limit your added sugars is to stop drinking sweetened beverages such as soda, sweet tea, and fruit juice.  There is 65g of added sugars in one 20oz bottle of Coke! That is equal to 7.5 donuts.   Pay attention and read all nutrition facts labels. Below is an examples of a nutrition facts label. The #1 is showing you the total sugars where the # 2 is showing you the added sugars. This one serving has almost the max amount of added sugars per day!   EXERCISE  Exercise is good. We've all heard that. In an ideal world, we would all have time and resources to get plenty of it. When you are active, your heart pumps more efficiently and you will feel better.  Multiple studies show that even walking regularly has benefits that include living a longer life. The American Heart Association recommends 150 minutes per week of exercise (30 minutes per day most days of the week). You can do this in any increment you wish. Nine or more 10-minute walks count. So does an hour-long exercise class. Break the time apart into what will work in your  life. Some of the best things you can do include walking briskly, jogging, cycling or swimming laps. Not everyone is ready to "exercise." Sometimes we need to start with just getting active. Here are some easy ways to be more active throughout the day:  Take the stairs instead of the elevator  Go for a 10-15 minute walk during your lunch break (find a friend to make it more enjoyable)  When shopping, park at the back of the parking lot  If you take public transportation, get off one stop early and walk the extra distance  Pace around while making phone calls  Check with your doctor if you aren't sure what your limitations may be. Always remember to drink plenty of water  when doing any type of exercise. Don't feel like a failure if you're not getting the 90-150 minutes per week. If you started by being a couch potato, then just a 10-minute walk each day is a huge improvement. Start with little victories and work your way up.   HEALTHY EATING TIPS  Plan ahead: make a menu of the meals for a week then create a grocery list to go with that menu. Consider meals that easily stretch into a night of leftovers, such as stews or casseroles. Or consider making two of your favorite meal and put one in the freezer for another night. Try a night or two each week that is "meatless" or "no cook" such as salads. When you get home from the grocery store wash and prepare your vegetables and fruits. Then when you need them they are ready to go.   Tips for going to the grocery store:  Buy store or generic brands  Check the weekly ad from your store on-line or in their in-store flyer  Look at the unit price on the shelf tag to compare/contrast the costs of different items  Buy fruits/vegetables in season  Carrots, bananas and apples are low-cost, naturally healthy items  If meats or frozen vegetables are on sale, buy some extras and put in your freezer  Limit buying prepared or "ready to eat" items, even  if they are pre-made salads or fruit snacks  Do not shop when you're hungry  Foods at eye level tend to be more expensive. Look on the high and low shelves for deals.  Consider shopping at the farmer's market for fresh foods in season.  Avoid the cookie and chip aisles (these are expensive, high in calories and low in nutritional value). Shop on the outside of the grocery store.  Healthy food preparations:  If you can't get lean hamburger, be sure to drain the fat when cooking  Steam, saut (in olive oil), grill or bake foods  Experiment with different seasonings to avoid adding salt to your foods. Kosher salt, sea salt and Himalayan salt are all still salt and should be avoided. Try seasoning food with onion, garlic, thyme, rosemary, basil ect. Onion powder or garlic powder is ok. Avoid if it says salt (ie garlic salt).

## 2023-09-27 NOTE — Progress Notes (Signed)
 Patient ID: James Dyer                 DOB: 11/21/1945                    MRN: [id number]     HPI: James Dyer is a 78 y.o. male patient referred to pharmacy clinic by [name] to initiate GLP1-RA therapy. PMH is significant for persistent A-fib, hypertension, hyperlipidemia, type 2 diabetes, GERD, dysphagia, hx of prostate cancer, hx of syncope and collapse, hx of ascending aortic aneurysm, and chronic neuromuscular symptoms and bilateral foot drop and obesity. Most recent BMI 38.7 kg/m .  Patient presents today to Pharm.D. clinic accompanied by his wife.  Patient's wife is concerned about patient's inactivity.  Wife thinks that patient can lose some weight it would be easier for him to move around and he would move more.  Patient with type 2 diabetes, Ozempic prior authorization was approved.  Cost about $170 per month.  Patient has part D plan where he has to pay 17% of drug costs.  He is also on Eliquis  and must be fairly close to the $2000.  We also talked about Mounjaro  which he is interested in trying over Ozempic.  Diet:  Breakfast: 2-3 scrambled/boiled eggs, small bowl of cereal, [place] bacon and toast Lunch: vegetables or grilled chicken salad Dinner: fast food Snack:  Drink: OJ, diet sun drop, water   Exercise: none  Family History: No family history on file.  Social History: no tobacco, seldom ETOH  Labs: Lab Results  Component Value Date   HGBA1C 7.5 (H) 08/08/2023    Wt Readings from Last 1 Encounters:  08/08/23 257 lb 8 oz (116.8 kg)    BP Readings from Last 1 Encounters:  08/09/23 (!) 144/86   Pulse Readings from Last 1 Encounters:  08/09/23 81       Component Value Date/Time   CHOL 160 08/09/2023 0509   TRIG 173 (H) 08/09/2023 0509   HDL 45 08/09/2023 0509   CHOLHDL 3.6 08/09/2023 0509   VLDL 35 08/09/2023 0509   LDLCALC 80 08/09/2023 0509    Past Medical History:  Diagnosis Date   Arthritis    HNP- lumbar   Borderline diabetes     Cancer (HCC) 2013   prostate , treated /w radiation    Diabetes mellitus without complication (HCC)    Foot drop, bilateral since birthpt wears braces in shoes   pt must have braces to walk   Hepatitis    pt told had been exposed to hepatitis when he went to donate blood   History of kidney stones    History of radiation therapy 04/12/11 - 06/06/11   prostate, seminal vesicles   History of stress test    Echocardiogram done - 03/2013   Hypercholesterolemia    states Dr. Richelle Rx Welchol- pt. hs stopped taking because he believe it gives him  leg cramps.   Hypertension    Nephrolithiasis    renal calculi- , also has been told that he has a cyst on kidney - R side    Neuromuscular disorder (HCC)    bilateral foot drop- both feet    Paroxysmal atrial fibrillation (HCC)     Current Outpatient Medications on File Prior to Visit  Medication Sig Dispense Refill   apixaban  (ELIQUIS ) 5 MG TABS tablet Take 1 tablet (5 mg total) by mouth 2 (two) times daily. 180 tablet 3   donepezil  (ARICEPT ) 5 MG tablet  Take 5 mg by mouth daily.     ezetimibe  (ZETIA ) 10 MG tablet Take 1 tablet (10 mg total) by mouth daily. 30 tablet 0   glimepiride (AMARYL) 2 MG tablet Take 2 mg by mouth daily with breakfast.     levothyroxine  (SYNTHROID ) 50 MCG tablet Take 50 mcg by mouth daily.     losartan  (COZAAR ) 25 MG tablet Take 1 tablet (25 mg total) by mouth daily. 30 tablet 1   metFORMIN  (GLUCOPHAGE ) 500 MG tablet Take 500 mg by mouth 2 (two) times daily.     metoprolol  tartrate (LOPRESSOR ) 25 MG tablet TAKE 1 TABLET(25 MG) BY MOUTH TWICE DAILY 180 tablet 1   nitroGLYCERIN  (NITROSTAT ) 0.4 MG SL tablet Place 1 tablet (0.4 mg total) under the tongue every 5 (five) minutes as needed for chest pain. 25 tablet 1   solifenacin  (VESICARE ) 10 MG tablet TAKE 1 TABLET(10 MG) BY MOUTH DAILY 90 tablet 3   Vibegron  (GEMTESA ) 75 MG TABS Take 1 tablet (75 mg total) by mouth daily. 30 tablet 11   No current facility-administered  medications on file prior to visit.    Allergies  Allergen Reactions   Latex Other (See Comments)    sores in mouth after teeth cleaned     Assessment/Plan:  1. Weight loss - Patient has not met goal of at least 5% of body weight loss with comprehensive lifestyle modifications alone in the past 3-6 months. Pharmacotherapy is appropriate to pursue as augmentation. Will start Mounjaro  2.5 mg weekly. Confirmed patient has no personal or family history of medullary thyroid  carcinoma (MTC) or Multiple Endocrine Neoplasia syndrome type 2 (MEN 2).  No history of pancreatitis or gallstones.  Patient counseled about increased risk.  Injection technique reviewed at today's visit.  Patient counseled on the importance of increasing fiber in his diet.  Counseled on the importance of cutting back/out fast food and snacks including cookies and chips.  Advised patient on common side effects including nausea, diarrhea, dyspepsia, decreased appetite, and fatigue. Counseled patient on reducing meal size and how to titrate medication to minimize side effects. Counseled patient to call if intolerable side effects or if experiencing dehydration, abdominal pain, or dizziness. Along with pharmacotherapy, the patient will follow dietary modifications and aim for at least 150 minutes of moderate-intensity exercise per week, plus resistance training twice a week (as recommended by the American Heart Association). This resistance training--such as weightlifting, bodyweight exercises, or using resistance bands, adapted to the patient's ability--will help prevent muscle loss. Patient counseled on the importance of exercise and that this medication does not differentiate between fat and muscle.  Exercise and eating enough protein are essential to preventing significant muscle loss.  Follow up in 1-2 days regarding coverage of Mounjaro  2.5 mg weekly. If therapy is initiated, phone follow-ups will be conducted every 4 weeks for  dose titration until the patient reaches the effective therapeutic dose and target weight.  Stop glipizide to prevent low blood sugar.  Breaunna Gottlieb D Romanda Turrubiates, Pharm.JONETTA SARAN, CPP [facility] [address], [place], [place] [address]  Phone: [phone]; Fax: [phone]

## 2023-09-27 NOTE — Telephone Encounter (Signed)
 Pharmacy Patient Advocate Encounter   Received notification from Physician's Office that prior authorization for mounjaro  2.5mg  is required/requested.   Insurance verification completed.   The patient is insured through [employer] .   Per test claim: PA required; PA submitted to above mentioned insurance via Latent Key/confirmation #/EOC [id number] Status is pending

## 2023-09-27 NOTE — Addendum Note (Signed)
 Addended by: Doroteo Nickolson D on: 09/27/2023 12:41 PM   Modules accepted: Orders

## 2023-09-27 NOTE — Telephone Encounter (Signed)
 Pharmacy Patient Advocate Encounter  Received notification from [facility] that Prior Authorization for mounjaro  has been APPROVED from 09/27/23 to 01/29/24. Ran test claim, Copay is $184.16 one month. This test claim was processed through [facility]- copay amounts may vary at other pharmacies due to pharmacy/plan contracts, or as the patient moves through the different stages of their insurance plan.   PA #/Case ID/Reference #: [id number]

## 2023-10-02 ENCOUNTER — Encounter: Payer: Self-pay | Admitting: Pharmacist

## 2023-10-18 ENCOUNTER — Telehealth: Payer: Self-pay | Admitting: Pharmacist

## 2023-10-18 MED ORDER — MOUNJARO 5 MG/0.5ML ~~LOC~~ SOAJ: 5.0000 mg | SUBCUTANEOUS | 0 refills | Status: DC

## 2023-10-18 NOTE — Telephone Encounter (Signed)
 Called patient to see how he was doing on Mounjaro .  Patient reports doing well.  Says he is lost about 7 pounds.  Has not curbed his appetite is much as he thought.  Ready to increase to the next dose.  He gives his last injection of 2.5 on Sunday.  Will send the 5 mg dose to pharmacy of choice.

## 2023-10-22 ENCOUNTER — Other Ambulatory Visit: Payer: Self-pay | Admitting: Urology

## 2023-10-22 DIAGNOSIS — N3281 Overactive bladder: Secondary | ICD-10-CM

## 2023-10-28 ENCOUNTER — Encounter: Attending: Nurse Practitioner | Admitting: Emergency Medicine

## 2023-10-28 ENCOUNTER — Ambulatory Visit

## 2023-10-28 VITALS — BP 146/98 | HR 85 | Temp 97.4°F | Resp 18

## 2023-10-28 DIAGNOSIS — E1169 Type 2 diabetes mellitus with other specified complication: Secondary | ICD-10-CM | POA: Insufficient documentation

## 2023-10-28 DIAGNOSIS — E785 Hyperlipidemia, unspecified: Secondary | ICD-10-CM | POA: Diagnosis not present

## 2023-10-28 MED ORDER — INCLISIRAN SODIUM 284 MG/1.5ML ~~LOC~~ SOSY
284.0000 mg | PREFILLED_SYRINGE | Freq: Once | SUBCUTANEOUS | Status: AC
  Administered 2023-10-28: 284 mg via SUBCUTANEOUS

## 2023-10-28 NOTE — Progress Notes (Signed)
 Diagnosis: Hyperlipidemia  Provider:  Miriam,. Elizabeth NP  Procedure: Injection  Leqvio  (inclisiran), Dose: 284 mg, Site: subcutaneous, Number of injections: 1  Injection Site(s): Left arm  Post Care: Patient declined observation  Discharge: Condition: Good, Destination: Home . AVS Provided  Performed by:  Delon ONEIDA Officer, RN

## 2023-11-11 ENCOUNTER — Telehealth: Payer: Self-pay | Admitting: Cardiovascular Disease

## 2023-11-11 NOTE — Telephone Encounter (Signed)
*  STAT* If patient is at the pharmacy, call can be transferred to refill team.   1. Which medications need to be refilled? (please list name of each medication and dose if known)   apixaban  (ELIQUIS ) 5 MG TABS tablet    2. Which pharmacy/location (including street and city if local pharmacy) is medication to be sent to? [facility] - [place], [place] - [id number]   3. Do they need a 30 day or 90 day supply? 90

## 2023-11-20 ENCOUNTER — Other Ambulatory Visit: Payer: Self-pay

## 2023-11-20 ENCOUNTER — Telehealth: Payer: Self-pay | Admitting: Nurse Practitioner

## 2023-11-20 DIAGNOSIS — R413 Other amnesia: Secondary | ICD-10-CM | POA: Diagnosis not present

## 2023-11-20 DIAGNOSIS — N3281 Overactive bladder: Secondary | ICD-10-CM | POA: Diagnosis not present

## 2023-11-20 DIAGNOSIS — I8290 Acute embolism and thrombosis of unspecified vein: Secondary | ICD-10-CM | POA: Diagnosis not present

## 2023-11-20 DIAGNOSIS — I10 Essential (primary) hypertension: Secondary | ICD-10-CM | POA: Diagnosis not present

## 2023-11-20 DIAGNOSIS — E782 Mixed hyperlipidemia: Secondary | ICD-10-CM | POA: Diagnosis not present

## 2023-11-20 DIAGNOSIS — Z7689 Persons encountering health services in other specified circumstances: Secondary | ICD-10-CM | POA: Diagnosis not present

## 2023-11-20 DIAGNOSIS — E1165 Type 2 diabetes mellitus with hyperglycemia: Secondary | ICD-10-CM | POA: Diagnosis not present

## 2023-11-20 DIAGNOSIS — E039 Hypothyroidism, unspecified: Secondary | ICD-10-CM | POA: Diagnosis not present

## 2023-11-20 DIAGNOSIS — R29898 Other symptoms and signs involving the musculoskeletal system: Secondary | ICD-10-CM | POA: Diagnosis not present

## 2023-11-20 MED ORDER — APIXABAN 5 MG PO TABS: 5.0000 mg | ORAL_TABLET | Freq: Two times a day (BID) | ORAL | 3 refills | Status: AC

## 2023-11-20 NOTE — Telephone Encounter (Signed)
*  STAT* If patient is at the pharmacy, call can be transferred to refill team.   1. Which medications need to be refilled? (please list name of each medication and dose if known)   tirzepatide  (MOUNJARO ) 5 MG/0.5ML Pen    2. Which pharmacy/location (including street and city if local pharmacy) is medication to be sent to? [facility] [place], [place] - [address] Phone: [phone]  Fax: [phone]      3. Do they need a 30 day or 90 day supply? 90 Needs refilled by Sunday

## 2023-11-20 NOTE — Telephone Encounter (Signed)
 Prescription refill request for Eliquis  received. Indication:afib Last office visit:8/25 Scr:0.97  7/25 Age: 78 Weight:116.8  kg  Prescription refilled

## 2023-11-22 ENCOUNTER — Telehealth: Payer: Self-pay | Admitting: Cardiovascular Disease

## 2023-11-22 MED ORDER — MOUNJARO 5 MG/0.5ML ~~LOC~~ SOAJ: 5.0000 mg | SUBCUTANEOUS | 0 refills | Status: DC

## 2023-11-22 NOTE — Telephone Encounter (Signed)
 Pt calling to f/u on refill request. Pt stats that he needs medication by Sunday or will be out. Please advise

## 2023-11-22 NOTE — Telephone Encounter (Signed)
*  STAT* If patient is at the pharmacy, call can be transferred to refill team.   1. Which medications need to be refilled? (please list name of each medication and dose if known)    tirzepatide  (MOUNJARO ) 5 MG/0.5ML Pen    2. Would you like to learn more about the convenience, safety, & potential cost savings by using the [facility]?   3. Are you open to using the [facility] (Type [facility]. ).   4. Which pharmacy/location (including street and city if local pharmacy) is medication to be sent to?  [facility] [place], [place] - [id number]   5. Do they need a 30 day or 90 day supply?   90 day   Caller (Rich) stated patient is completely out of this medication.

## 2023-12-16 ENCOUNTER — Encounter: Payer: Self-pay | Admitting: Nurse Practitioner

## 2023-12-16 ENCOUNTER — Other Ambulatory Visit (HOSPITAL_COMMUNITY): Payer: Self-pay | Admitting: Nurse Practitioner

## 2023-12-19 ENCOUNTER — Telehealth: Payer: Self-pay | Admitting: Pharmacist

## 2023-12-20 NOTE — Telephone Encounter (Signed)
 Tried to call pt but it goes right to VM Called wife who said she will let him know we are trying to reach him

## 2023-12-23 NOTE — Telephone Encounter (Signed)
 Patient is returning call.

## 2023-12-23 NOTE — Progress Notes (Signed)
 Impression/Assessment:  Grade group 2 prostate cancer, status post radiotherapy over 10 years ago with excellent PSA result thus far  Overactive bladder, he did not have improvement with PTNS  History of urolithiasis, no evidence of recurrent  Plan:    History of Present Illness:   This man is here today for follow-up of the below listed issues:  1.  History of prostate cancer.  10.25.2012: TRUS/Bx. Prostate volume 38 mL, PSA density 0.10.  1/12 cores revealed GS 3+4 pattern.  He completed IMRT on 5.8.2013.  PSA levels have been undetectable recently, last checked in late 2023.   2.  History of urolithiasis.  Had lithotripsy for a left upper ureteral stone on 03/04/2017.   3.  Overactive bladder symptoms.   He has been on Solifenacin .  He completed a series of 12 PTNS treatments without significant improvement.  11.25.2025:  Past Medical History:  Diagnosis Date   Arthritis    HNP- lumbar   Borderline diabetes    Cancer (HCC) 2013   prostate , treated /w radiation    Diabetes mellitus without complication (HCC)    Foot drop, bilateral since birthpt wears braces in shoes   pt must have braces to walk   Hepatitis    pt told had been exposed to hepatitis when he went to donate blood   History of kidney stones    History of radiation therapy 04/12/11 - 06/06/11   prostate, seminal vesicles   History of stress test    Echocardiogram done - 03/2013   Hypercholesterolemia    states Dr. Richelle Rx Welchol- pt. hs stopped taking because he believe it gives him  leg cramps.   Hypertension    Nephrolithiasis    renal calculi- , also has been told that he has a cyst on kidney - R side    Neuromuscular disorder (HCC)    bilateral foot drop- both feet    Paroxysmal atrial fibrillation (HCC)     Past Surgical History:  Procedure Laterality Date   BACK SURGERY     CARDIOVERSION N/A 02/28/2021   Procedure: CARDIOVERSION;  Surgeon: Francyne Headland, MD;  [facility]: [facility];   Service: Cardiovascular;  Laterality: N/A;   COLONOSCOPY N/A 07/22/2012   Procedure: COLONOSCOPY;  Surgeon: Oneil DELENA Budge, MD;  Location: [facility];  Service: Gastroenterology;  Laterality: N/A;   ESOPHAGEAL DILATION N/A 04/22/2019   Procedure: ESOPHAGEAL DILATION;  Surgeon: Golda Claudis PENNER, MD;  Location: [facility];  Service: Endoscopy;  Laterality: N/A;   ESOPHAGOGASTRODUODENOSCOPY N/A 04/22/2019   Procedure: ESOPHAGOGASTRODUODENOSCOPY (EGD);  Surgeon: Golda Claudis PENNER, MD;  Location: [facility];  Service: Endoscopy;  Laterality: N/A;  145   EXTRACORPOREAL SHOCK WAVE LITHOTRIPSY Left 03/04/2017   Procedure: LEFT EXTRACORPOREAL SHOCK WAVE LITHOTRIPSY (ESWL);  Surgeon: Devere Lonni Righter, MD;  Location: [facility];  Service: Urology;  Laterality: Left;   FOOT SURGERY Right 1980's   calcium  deposit removed   LUMBAR LAMINECTOMY/DECOMPRESSION MICRODISCECTOMY Right 09/01/2013   Procedure: RIGHT LUMBAR FOUR-FIVE LAMINECTOMY/DECOMPRESSION MICRODISCECTOMY 1 LEVEL;  Surgeon: Rockey Peru, MD;  Location: [facility];  Service: Neurosurgery;  Laterality: Right;  Right L45 microdiskectomy   prostate biopsy  2013   SHOULDER OPEN ROTATOR CUFF REPAIR Left 09/09/2012   Procedure: LEFT ROTATOR CUFF REPAIR SHOULDER OPEN;  Surgeon: Tanda DELENA Heading, MD;  Location: [facility];  Service: Orthopedics;  Laterality: Left;    Home Medications:  Allergies as of 12/24/2023       Reactions  Latex Other (See Comments)   sores in mouth after teeth cleaned        Medication List        Accurate as of December 23, 2023 11:28 AM. If you have any questions, ask your nurse or doctor.          apixaban  5 MG Tabs tablet Commonly known as: ELIQUIS  Take 1 tablet (5 mg total) by mouth 2 (two) times daily.   donepezil  5 MG tablet Commonly known as: ARICEPT  Take 5 mg by mouth daily.   ezetimibe  10 MG tablet Commonly known as: ZETIA  Take 1 tablet (10 mg total) by mouth daily.   Leqvio  284 MG/1.5ML  Sosy injection Generic drug: inclisiran Inject 284 mg into the skin every 6 (six) months.   levothyroxine  50 MCG tablet Commonly known as: SYNTHROID  Take 50 mcg by mouth daily.   losartan  25 MG tablet Commonly known as: COZAAR  Take 1 tablet (25 mg total) by mouth daily.   metFORMIN  500 MG tablet Commonly known as: GLUCOPHAGE  Take 500 mg by mouth 2 (two) times daily.   metoprolol  tartrate 25 MG tablet Commonly known as: LOPRESSOR  TAKE 1 TABLET(25 MG) BY MOUTH TWICE DAILY   Mounjaro  5 MG/0.5ML Pen Generic drug: tirzepatide  Inject 5 mg into the skin once a week.   nitroGLYCERIN  0.4 MG SL tablet Commonly known as: NITROSTAT  Place 1 tablet (0.4 mg total) under the tongue every 5 (five) minutes as needed for chest pain.   solifenacin  10 MG tablet Commonly known as: VESICARE  TAKE 1 TABLET(10 MG) BY MOUTH DAILY        Allergies:  Allergies  Allergen Reactions   Latex Other (See Comments)    sores in mouth after teeth cleaned    No family history on file.  Social History:  reports that he quit smoking about 57 years ago. His smoking use included cigarettes. He started smoking about 62 years ago. He has a 5 pack-year smoking history. He has never used smokeless tobacco. He reports current alcohol  use. He reports that he does not use drugs.  ROS: A complete review of systems was performed.  All systems are negative except for pertinent findings as noted.  Physical Exam:  Vital signs in last 24 hours: There were no vitals taken for this visit. Constitutional:  Alert and oriented, No acute distress Cardiovascular: Regular rate  Respiratory: Normal respiratory effort Neurologic: Grossly intact, no focal deficits Psychiatric: Normal mood and affect  I have reviewed prior pt notes  I have reviewed urinalysis results  I have independently reviewed prior imaging-postvoid residual volume negligible  I have reviewed prior PSA results  Most recent CT scan from last  month (angiogram) reviewed-images seen with patient.  No evidence of hydronephrosis or calculus disease.  He does have a 4 cm left renal cyst.

## 2023-12-24 ENCOUNTER — Ambulatory Visit: Admitting: Urology

## 2023-12-24 ENCOUNTER — Other Ambulatory Visit: Payer: Self-pay | Admitting: Cardiovascular Disease

## 2023-12-24 VITALS — BP 136/82 | HR 78

## 2023-12-24 DIAGNOSIS — N3281 Overactive bladder: Secondary | ICD-10-CM | POA: Diagnosis not present

## 2023-12-24 DIAGNOSIS — N3941 Urge incontinence: Secondary | ICD-10-CM | POA: Diagnosis not present

## 2023-12-24 DIAGNOSIS — Z923 Personal history of irradiation: Secondary | ICD-10-CM | POA: Diagnosis not present

## 2023-12-24 DIAGNOSIS — Z8546 Personal history of malignant neoplasm of prostate: Secondary | ICD-10-CM | POA: Diagnosis not present

## 2023-12-24 DIAGNOSIS — Z87442 Personal history of urinary calculi: Secondary | ICD-10-CM | POA: Diagnosis not present

## 2023-12-24 MED ORDER — MOUNJARO 7.5 MG/0.5ML ~~LOC~~ SOAJ: 7.5000 mg | SUBCUTANEOUS | 0 refills | Status: DC

## 2023-12-24 NOTE — Telephone Encounter (Signed)
 Patient states his wife says that he needs to increase dose, since its not doing what she expected. Will increase dose to 7.5mg  weekly.  Advised pt to call me at my direct line (# given) next time he needs a refill

## 2024-01-13 ENCOUNTER — Telehealth: Payer: Self-pay | Admitting: Nurse Practitioner

## 2024-01-13 DIAGNOSIS — E11649 Type 2 diabetes mellitus with hypoglycemia without coma: Secondary | ICD-10-CM

## 2024-01-13 MED ORDER — MOUNJARO 7.5 MG/0.5ML ~~LOC~~ SOAJ: 7.5000 mg | SUBCUTANEOUS | 0 refills | Status: DC

## 2024-01-13 NOTE — Telephone Encounter (Signed)
*  STAT* If patient is at the pharmacy, call can be transferred to refill team.     1. Which medications need to be refilled? (please list name of each medication and dose if known) tirzepatide  (MOUNJARO ) 7.5 MG/0.5ML Pen      2. Would you like to learn more about the convenience, safety, & potential cost savings by using the [facility]? NO       3. Are you open to using the [facility] (Type [facility])     4. Which pharmacy/location (including street and city if local pharmacy) is medication to be sent to?      [facility] [place], [place] - [id number]   5. Do they need a 30 day or 90 day supply? 90 day

## 2024-01-24 ENCOUNTER — Ambulatory Visit: Attending: Nurse Practitioner | Admitting: Nurse Practitioner

## 2024-01-24 ENCOUNTER — Encounter: Payer: Self-pay | Admitting: Nurse Practitioner

## 2024-01-24 VITALS — BP 116/80 | HR 72 | Ht 69.0 in | Wt 255.0 lb

## 2024-01-24 DIAGNOSIS — E785 Hyperlipidemia, unspecified: Secondary | ICD-10-CM | POA: Insufficient documentation

## 2024-01-24 DIAGNOSIS — E669 Obesity, unspecified: Secondary | ICD-10-CM | POA: Insufficient documentation

## 2024-01-24 DIAGNOSIS — I4819 Other persistent atrial fibrillation: Secondary | ICD-10-CM | POA: Diagnosis present

## 2024-01-24 DIAGNOSIS — I10 Essential (primary) hypertension: Secondary | ICD-10-CM | POA: Diagnosis present

## 2024-01-24 DIAGNOSIS — Z87898 Personal history of other specified conditions: Secondary | ICD-10-CM | POA: Diagnosis present

## 2024-01-24 DIAGNOSIS — I7121 Aneurysm of the ascending aorta, without rupture: Secondary | ICD-10-CM | POA: Insufficient documentation

## 2024-01-24 DIAGNOSIS — I259 Chronic ischemic heart disease, unspecified: Secondary | ICD-10-CM | POA: Insufficient documentation

## 2024-01-24 DIAGNOSIS — R319 Hematuria, unspecified: Secondary | ICD-10-CM | POA: Diagnosis present

## 2024-01-24 NOTE — Patient Instructions (Signed)
 Medication Instructions:  Your physician recommends that you continue on your current medications as directed. Please refer to the Current Medication list given to you today.  *If you need a refill on your cardiac medications before your next appointment, please call your pharmacy*  Lab Work: CBC BMET  If you have labs (blood work) drawn today and your tests are completely normal, you will receive your results only by: MyChart Message (if you have MyChart) OR A paper copy in the mail If you have any lab test that is abnormal or we need to change your treatment, we will call you to review the results.  Testing/Procedures: None  Follow-Up: At [facility], you and your health needs are our priority.  As part of our continuing mission to provide you with exceptional heart care, our providers are all part of one team.  This team includes your primary Cardiologist (physician) and Advanced Practice Providers or APPs (Physician Assistants and Nurse Practitioners) who all work together to provide you with the care you need, when you need it.  Your next appointment:   3 month(s)  Provider:   You may see [name], MD or the following Advanced Practice Provider on your designated Care Team:   [name], NP    We recommend signing up for the patient portal called MyChart.  Sign up information is provided on this After Visit Summary.  MyChart is used to connect with patients for Virtual Visits (Telemedicine).  Patients are able to view lab/test results, encounter notes, upcoming appointments, etc.  Non-urgent messages can be sent to your provider as well.   To learn more about what you can do with MyChart, go to [email or web address].   Other Instructions Thank you for choosing [facility]!

## 2024-01-24 NOTE — Progress Notes (Unsigned)
 " Cardiology Office Note:  .   Date: 01/24/2024 ID:  James Dyer, DOB July 05, 1945, MRN [id number] PCP: [name], MD  [facility] Providers Cardiologist:  [name], MD    History of Present Illness: .   James Dyer is a 78 y.o. male with a PMH of persistent A-fib, hypertension, hyperlipidemia, type 2 diabetes, GERD, dysphagia, hx of prostate cancer, hx of syncope and collapse, hx of ascending aortic aneurysm, and chronic neuromuscular symptoms and bilateral foot drop, who presents today for scheduled follow-up.    Previous CV hx includes no known CAD, was found to have nonischemic Myoview  in 2015.  Found to be in asymptomatic A-fib in 2021.  TTE EF 50 to 55%, no significant valvular disease.  Did undergo cardioversion with Dr. Francyne in 2023, but did have early recurrence of A-fib at OV follow-up post DCCV.   Admitted 04/2022 d/t AMS and fall at home, also had AKI. Found to have mild rhabdomyolysis, nontraumatic, and resolved with IV fluid resuscitation. Also found to have acute UTI/retention. Hospital course complicated by acute metabolic encephalopathy that was felt to be likely due to hypoglycemia. MRI of the brain was negative for any abnormalities.  Last saw pt for hospital follow-up on Jun 12, 2022. Said he fell due to a syncopal episode at home while walking to the bathroom, no warning signs/symptoms prior to event. Noted fatigue and dizziness, denied any orthostatic dizziness. Was working with PT.   07/30/2022 - Today he presents for follow-up.  Doing well.  Has not had any more syncopal episodes.  Son states that his last syncopal episode was due to low blood sugar, blood sugar on arrival to ED was 38.  Patient now has Libre freestyle continuous glucose monitoring device, patient states his readings have been labile. Denies any chest pain, shortness of breath, palpitations, syncope, presyncope, dizziness, orthopnea, PND, swelling or significant weight changes, acute  bleeding, or claudication.  Son states patient occasionally eats foods high in sugar.   02/01/2023 - Doing well. Denies any more syncopal episodes. Denies any chest pain, shortness of breath, palpitations, syncope, presyncope, dizziness, orthopnea, PND, swelling or significant weight changes, acute bleeding, or claudication. Admits to weight gain. Does admit to burning sensation in feet and legs.   05/16/2023 -today presents for follow-up with his wife.  Says now he is taking thyroid  medications.  Seems to be doing a little bit better after beginning medicine.  Says a medical staff member at PCPs office was speaking to him about asking about injectable medication to help control his cholesterol.  Says he fell about 2 weeks ago since I last saw him.  Says he slipped and fell and hit his head, attributes this to leg braces and hx of dropped foot.  No LOC.  CT imaging in ED on March 25 was negative for anything acute. Denies any chest pain, shortness of breath, palpitations, syncope, presyncope, dizziness, orthopnea, PND, swelling or significant weight changes, acute bleeding, or claudication. Continues to admit to some weakness along BLE, seems to be some improved since starting thyroid  medication.   07/23/2023 -  Here for follow-up. Doing well. Denies any cardiac complaints or issues. Wife would like to know about possible medications for weight loss for patient. Denies any chest pain, shortness of breath, palpitations, syncope, presyncope, dizziness, orthopnea, PND, swelling or significant weight changes, acute bleeding, or claudication.  Short hospital stay in July 2025 due to TIA.  Upon arrival to the ED, son reported slurred speech  and by the time of MD evaluation, speech was back to baseline.  Patient also reported that he had left-sided weakness, symptoms resolved.  CTA of head showed segments of moderate to severe stenosis of a few arteries and MRI of brain was negative for acute abnormality with findings  of moderate to advanced chronic small vessel ischemic changes similar to prior.  Patient was admitted for TIA workup, neurology consulted.  01/24/2024 - Doing overall well from a cardiac standpoint. He has lost weight since I last saw him.  Main concern is he has had some recent episodes of passing blood in his urine that he says is pretty thick and dark.  Says this has happened a few times and has minimal burning with urination, does have known diagnosis of overactive bladder, says this discharge seem to be pretty thick.  Says he is not sure what this is due to.  Has seen urology in the past. Denies any chest pain, shortness of breath, palpitations, syncope, presyncope, dizziness, orthopnea, PND, swelling or significant weight changes, or claudication.   Studies Reviewed: SABRA    EKG: EKG is not ordered today.   Lower extremity arterial bilateral 05/2023: Summary:  Right: Atherosclerotic plaque noted throughout. Not hemodynamically  significant.   Left: Atherosclerotic plaque noted throughout. Not hemodynamically  significant.     See table(s) above for measurements and observations.   Suggest follow up study in if clinically indicted. Normal triphasic  waveform noted throughout the bilateral lower extremity. Right ABI 0.99  and left 0.93.   Echo 05/2023: 1. Left ventricular ejection fraction, by estimation, is 50 to 55%. The  left ventricle has low normal function. The left ventricle has no regional  wall motion abnormalities. Left ventricular diastolic parameters are  indeterminate.   2. Right ventricular systolic function is normal. The right ventricular  size is normal. Tricuspid regurgitation signal is inadequate for assessing  PA pressure.   3. Left atrial size was mildly dilated.   4. The mitral valve is abnormal. Mild mitral valve regurgitation. No  evidence of mitral stenosis.   5. The aortic valve is tricuspid. Aortic valve regurgitation is mild. No  aortic stenosis is  present.   6. Aortic dilatation noted. There is mild dilatation of the ascending  aorta, measuring 44 mm.   7. The inferior vena cava is normal in size with greater than 50%  respiratory variability, suggesting right atrial pressure of 3 mmHg.   Comparison(s): A prior study was performed on 06/28/2022. EF 50-55%. LA  moderately dilated. Mild MR. Mild AI. Aortic dilatation of the ascending  aorta measuring 44 mm.  Lexiscan  03/2023:  Perfusion/Defect LV perfusion is abnormal. There is evidence of ischemia. There is evidence of infarction. Defect 1: There is a small defect with mild reduction in uptake present in the mid to basal inferior location(s) that is partially reversible. There is normal wall motion in the defect area. Consistent with infarction and ischemia. The defect is consistent with abnormal perfusion in the RCA territory.  Chamber Size Left ventricular function is abnormal. Global function is mildly reduced. Nuclear stress EF: 54%. The left ventricular ejection fraction is mildly decreased (45-54%). End diastolic cavity size is mildly enlarged. End systolic cavity size is normal. No evidence of transient ischemic dilation (TID) noted.  Stress Combined Conclusion Findings are consistent with infarction with peri-infarct ischemia. The study is low risk.   ABI's 01/2023: Summary:  Right: Resting right ankle-brachial index is within normal range. The  right toe-brachial index  is abnormal.   Left: Resting left ankle-brachial index indicates mild left lower  extremity arterial disease. The left toe-brachial index is normal.  Cardiac monitor 06/2022:  Patch Wear Time:  14 days and 0 hours (2024-05-14T16:42:13-0400 to 2024-05-28T16:42:13-0400)   Atrial Fibrillation occurred continuously (100% burden), ranging from 40-126 bpm (avg of 71 bpm). Isolated VEs were rare (<1.0%), VE Couplets were rare (<1.0%), and no VE Triplets were present. Ventricular Bigeminy and Trigeminy were present.    Echo 05/2022:  1. Left ventricular ejection fraction, by estimation, is 50 to 55%. The  left ventricle has low normal function. The left ventricle has no regional  wall motion abnormalities. Left ventricular diastolic parameters are  indeterminate.   2. Right ventricular systolic function is normal. The right ventricular  size is normal. Tricuspid regurgitation signal is inadequate for assessing  PA pressure.   3. Left atrial size was moderately dilated.   4. The mitral valve is abnormal. Mild mitral valve regurgitation. No  evidence of mitral stenosis.   5. The aortic valve is tricuspid. There is mild calcification of the  aortic valve. There is mild thickening of the aortic valve. Aortic valve  regurgitation is mild. No aortic stenosis is present.   6. Aortic dilatation noted. There is moderate dilatation of the ascending  aorta, measuring 44 mm.   7. The inferior vena cava is normal in size with greater than 50%  respiratory variability, suggesting right atrial pressure of 3 mmHg.      Physical Exam:   VS:  BP 116/80   Pulse 72   Ht 5' 9 (1.753 m)   Wt 255 lb (115.7 kg)   SpO2 96%   BMI 37.66 kg/m    Wt Readings from Last 3 Encounters:  01/24/24 255 lb (115.7 kg)  08/08/23 257 lb 8 oz (116.8 kg)  07/23/23 262 lb 6.4 oz (119 kg)    GEN: Obese, 79 year old male in no acute distress NECK: No JVD; No carotid bruits CARDIAC: S1/S2, irregular rhythm and regular rate, no murmurs, rubs, gallops RESPIRATORY:  Clear to auscultation without rales, wheezing or rhonchi  EXTREMITIES:  No edema; No deformity   ASSESSMENT AND PLAN: .    Ischemic heart disease Most recent NST 03/2023 revealed findings consistent with past infarction, with peri-infarct ischemia. Study found to be low risk. Denies any chest pain. Will continue to monitor.  Not on ASA d/t being on Eliquis . Continue current medication regimen. Care and ED precautions discussed.   2. Hx of syncope  Etiology d/t  hypoglycemia. Denies any recurrent episodes. Past workup overall unremarkable. Continue to follow with PCP and continue current medication regimen. Care and ED precautions discussed.    Persistent A-fib Denies any tachycardia or palpitations. HR well controlled today. Continue Lopressor  and Eliquis  5 mg BID, on appropriate dosage and denies any bleeding issues. Heart healthy diet encouraged.    HTN BP stable. Discussed to monitor BP at home at least 2 hours after medications and sitting for 5-10 minutes. Continue current medication regimen. Heart healthy diet encouraged.    HLD Currently tolerating medications well.  Most recent LDL 80 from July 2025.  He is on Zetia  and receiving Leqvio  injections.  Heart healthy diet encouraged.    5. Ascending aortic aneurysm Noted on CT 04/2022, measuring 4.1 cm. Recommended for annual imaging. It was recommended he follow-up with PCP for CTA and with vascular surgery for further imaging outpatient. Most recent CT imaging done recently did not evaluate chest. Discussed options with  pt. Recent Echo revealed mild dilatation of ascending aorta at 44 mm.  Care and ED precautions discussed.  Plan to update this imaging by May 2026.  7. Obesity Doing well on Mounjaro .  Has lost weight since I have last seen him.  Weight loss via diet and exercise encouraged. Discussed the impact being overweight would have on cardiovascular risk. Continue to follow with PCP.  8. Hematuria Noted recently some recent episodes.  Will obtain stat CBC and BMET today and I will call him tomorrow to see if he has had any more blood in his urine and we will go over his blood work results tomorrow.  Will place referral to urology to be seen ASAP.  Dispo: Follow-up with Dr. Delford or APP in 3 months or sooner if anything changes.  Signed, [name], NP   "

## 2024-01-27 ENCOUNTER — Ambulatory Visit: Admitting: Nurse Practitioner

## 2024-01-28 ENCOUNTER — Ambulatory Visit (INDEPENDENT_AMBULATORY_CARE_PROVIDER_SITE_OTHER): Admitting: Urology

## 2024-01-28 ENCOUNTER — Telehealth: Payer: Self-pay | Admitting: Urology

## 2024-01-28 VITALS — BP 123/82 | HR 85

## 2024-01-28 DIAGNOSIS — Z8546 Personal history of malignant neoplasm of prostate: Secondary | ICD-10-CM | POA: Diagnosis not present

## 2024-01-28 DIAGNOSIS — N3281 Overactive bladder: Secondary | ICD-10-CM | POA: Diagnosis not present

## 2024-01-28 DIAGNOSIS — Z923 Personal history of irradiation: Secondary | ICD-10-CM | POA: Diagnosis not present

## 2024-01-28 DIAGNOSIS — Z87442 Personal history of urinary calculi: Secondary | ICD-10-CM | POA: Diagnosis not present

## 2024-01-28 DIAGNOSIS — R310 Gross hematuria: Secondary | ICD-10-CM

## 2024-01-28 MED ORDER — FINASTERIDE 5 MG PO TABS: 5.0000 mg | ORAL_TABLET | Freq: Every day | ORAL | 11 refills | Status: AC

## 2024-01-28 NOTE — Telephone Encounter (Signed)
"  error  "

## 2024-01-28 NOTE — Progress Notes (Signed)
 "  Impression/Assessment:  -Grade group 2 prostate cancer, status post radiotherapy over 12 years out, with persistently undetectable PSA  --Overactive bladder, significant.  Still having issues despite solifenacin .  -History of urolithiasis, no evidence of recurrent stones recently  - Intermittent gross hematuria-question whether this is total or initial.  He does have a history of radiotherapy and is on Eliquis  Plan:  -Urine culture was sent but no antibiotics administered  - I started him out on finasteride in case his bleeding is from his prostatic urethra  - He does not need to stop his Eliquis  as the blood is only a couple of times a week  - I will have him come back in a couple of months to recheck   History of Present Illness:   This man is here today for follow-up of the below listed issues:  1.  History of prostate cancer.  10.25.2012: TRUS/Bx. Prostate volume 38 mL, PSA density 0.10.  1/12 cores revealed GS 3+4 pattern.  He completed IMRT on 5.8.2013.  PSA levels have been undetectable recently, last checked in late 2023.   2.  History of urolithiasis.  Had lithotripsy for a left upper ureteral stone on 03/04/2017.    3.  Overactive bladder symptoms.     11.25.2025: Continued issues with frequency, urgency, urgency incontinence.  Failed Myrbetriq , PTNS.  On solifenacin  currently.  No gross hematuria.  He has had no dysuria or recent UTI.  He has tried the men's liberator but this does not stick well.  12.30.2025: Unscheduled visit for this man.  He has been having intermittent gross painless hematuria for about a month.  A couple times a week.  Maybe some dysuria.  No fever or chills.  He is on Eliquis .  He has not had foul-smelling urine.  This may be initial in nature.  Sometimes he passes a clot at the beginning of his stream.  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 Richelle Rx Welchol- pt. hs stopped taking because he believe it gives him  leg cramps.   Hypertension    Nephrolithiasis    renal calculi- , also has been told that he has a cyst on kidney - R side    Neuromuscular disorder (HCC)    bilateral foot drop- both feet    Paroxysmal atrial fibrillation (HCC)     Past Surgical History:  Procedure Laterality Date   BACK SURGERY     CARDIOVERSION N/A 02/28/2021   Procedure: CARDIOVERSION;  Surgeon: James Headland, MD;  Location: [facility];  Service: Cardiovascular;  Laterality: N/A;   COLONOSCOPY N/A 07/22/2012   Procedure: COLONOSCOPY;  Surgeon: James DELENA Budge, MD;  Location: [facility];  Service: Gastroenterology;  Laterality: N/A;   ESOPHAGEAL DILATION N/A 04/22/2019   Procedure: ESOPHAGEAL DILATION;  Surgeon: Golda James PENNER, MD;  Location: [facility];  Service: Endoscopy;  Laterality: N/A;   ESOPHAGOGASTRODUODENOSCOPY N/A 04/22/2019   Procedure: ESOPHAGOGASTRODUODENOSCOPY (EGD);  Surgeon: Golda James PENNER, MD;  Location: [facility];  Service: Endoscopy;  Laterality: N/A;  145   EXTRACORPOREAL SHOCK WAVE LITHOTRIPSY Left 03/04/2017   Procedure: LEFT EXTRACORPOREAL SHOCK WAVE LITHOTRIPSY (ESWL);  Surgeon: James Lonni Righter, MD;  Location: [facility];  Service: Urology;  Laterality: Left;   FOOT SURGERY Right 1980's   calcium  deposit removed   LUMBAR LAMINECTOMY/DECOMPRESSION MICRODISCECTOMY Right 09/01/2013   Procedure: RIGHT LUMBAR FOUR-FIVE LAMINECTOMY/DECOMPRESSION MICRODISCECTOMY 1 LEVEL;  Surgeon:  James Peru, MD;  Location: [facility];  Service: Neurosurgery;  Laterality: Right;  Right L45 microdiskectomy   prostate biopsy  2013   SHOULDER OPEN ROTATOR CUFF REPAIR Left 09/09/2012   Procedure: LEFT ROTATOR CUFF REPAIR SHOULDER OPEN;  Surgeon: James DELENA Heading, MD;  Location: [facility];  Service: Orthopedics;  Laterality: Left;    Home Medications:  Allergies as of 01/28/2024       Reactions   Latex Other (See Comments)   sores in mouth after teeth cleaned        Medication List        Accurate as of January 28, 2024 12:19 PM. If you have any questions, ask your nurse or doctor.          apixaban  5 MG Tabs tablet Commonly known as: ELIQUIS  Take 1 tablet (5 mg total) by mouth 2 (two) times daily.   donepezil  5 MG tablet Commonly known as: ARICEPT  Take 5 mg by mouth daily.   ezetimibe  10 MG tablet Commonly known as: ZETIA  Take 1 tablet (10 mg total) by mouth daily.   Leqvio  284 MG/1.5ML Sosy injection Generic drug: inclisiran Inject 284 mg into the skin every 6 (six) months.   levothyroxine  50 MCG tablet Commonly known as: SYNTHROID  Take 50 mcg by mouth daily.   losartan  25 MG tablet Commonly known as: COZAAR  Take 1 tablet (25 mg total) by mouth daily.   metFORMIN  500 MG tablet Commonly known as: GLUCOPHAGE  Take 500 mg by mouth 2 (two) times daily.   metoprolol  tartrate 25 MG tablet Commonly known as: LOPRESSOR  TAKE 1 TABLET(25 MG) BY MOUTH TWICE DAILY   Mounjaro  7.5 MG/0.5ML Pen Generic drug: tirzepatide  Inject 7.5 mg into the skin once a week.   nitroGLYCERIN  0.4 MG SL tablet Commonly known as: NITROSTAT  Place 1 tablet (0.4 mg total) under the tongue every 5 (five) minutes as needed for chest pain.   solifenacin  10 MG tablet Commonly known as: VESICARE  TAKE 1 TABLET(10 MG) BY MOUTH DAILY        Allergies:  Allergies  Allergen Reactions   Latex Other (See Comments)    sores in mouth after teeth cleaned    No family history on  file.  Social History:  reports that he quit smoking about 57 years ago. His smoking use included cigarettes. He started smoking about 62 years ago. He has a 5 pack-year smoking history. He has never used smokeless tobacco. He reports current alcohol  use. He reports that he does not use drugs.   I have reviewed prior pt notes  I have reviewed urinalysis results--still with microscopic hematuria  I have reviewed prior PSA results--undetectable in May 2025  CT abdomen and pelvis (angiogram) from April of this year reviewed.  Cystic changes of kidneys.  No evident calculi.  No masses.      "

## 2024-01-28 NOTE — Progress Notes (Signed)
 I called and s/w patient on 01/25/2024 and updated him on his labs that are overall unremarkable, however kidney function shows sCr at 1.33 with eGFR 55. Past kidney trends unremarkable. Possibly dehydrated. Normal CBC. No bleeding within the past day per patient's report. Pt states he wonders if these symptoms were due to a kidney stone as he has had these before. I discussed care plan and encouraged him to stay well hydrated and follow-up with Urology. Care and ED precautions discussed. He verbalized understanding of instructions and was appreciative of my call.   [name], NP

## 2024-01-29 LAB — MICROSCOPIC EXAMINATION: RBC, Urine: >30 /HPF — AB (ref 0–2)

## 2024-01-29 LAB — URINALYSIS, ROUTINE W REFLEX MICROSCOPIC
Bilirubin, UA: NEGATIVE
Glucose, UA: NEGATIVE
Ketones, UA: NEGATIVE
Nitrite, UA: NEGATIVE
Specific Gravity, UA: 1.02 (ref 1.005–1.030)
Urobilinogen, Ur: 1 mg/dL (ref 0.2–1.0)
pH, UA: 6 (ref 5.0–7.5)

## 2024-01-31 LAB — URINE CULTURE

## 2024-02-03 ENCOUNTER — Ambulatory Visit: Payer: Self-pay

## 2024-02-03 DIAGNOSIS — N3001 Acute cystitis with hematuria: Secondary | ICD-10-CM

## 2024-02-03 MED ORDER — SULFAMETHOXAZOLE-TRIMETHOPRIM 800-160 MG PO TABS: 1.0000 | ORAL_TABLET | Freq: Two times a day (BID) | ORAL | 0 refills | Status: AC

## 2024-02-03 NOTE — Telephone Encounter (Signed)
 Called and left voicemail for patient to call back for results (no DPR on file) phone number was left for patient

## 2024-02-03 NOTE — Telephone Encounter (Signed)
-----   Message from Garnette Shack, MD sent at 02/03/2024 12:36 PM EST ----- Let patient know that the culture was positive and I sent a prescription for sulfa  in

## 2024-02-05 ENCOUNTER — Telehealth: Payer: Self-pay | Admitting: Nurse Practitioner

## 2024-02-05 DIAGNOSIS — E11649 Type 2 diabetes mellitus with hypoglycemia without coma: Secondary | ICD-10-CM

## 2024-02-05 NOTE — Telephone Encounter (Signed)
" °*  STAT* If patient is at the pharmacy, call can be transferred to refill team.   1. Which medications need to be refilled? (please list name of each medication and dose if known)  tirzepatide  (MOUNJARO ) 7.5 MG/0.5ML Pen    2. Which pharmacy/location (including street and city if local pharmacy) is medication to be sent to? [facility] [place], [place] - [address] Phone: [phone]  Fax: [phone]      3. Do they need a 30 day or 90 day supply?   "

## 2024-02-07 MED ORDER — MOUNJARO 7.5 MG/0.5ML ~~LOC~~ SOAJ: 7.5000 mg | SUBCUTANEOUS | 2 refills | Status: AC

## 2024-02-07 NOTE — Telephone Encounter (Signed)
 Refills has been sent to the pharmacy.

## 2024-03-31 ENCOUNTER — Ambulatory Visit: Admitting: Urology

## 2024-04-13 ENCOUNTER — Ambulatory Visit: Admitting: Nurse Practitioner

## 2024-04-27 ENCOUNTER — Ambulatory Visit

## 2024-06-24 ENCOUNTER — Ambulatory Visit: Admitting: Urology
